# Patient Record
Sex: Female | Born: 1940
Health system: Southern US, Community
[De-identification: ages and names within clinical notes are randomized; demographics above are authoritative.]

## PROBLEM LIST (undated history)

## (undated) DIAGNOSIS — B029 Zoster without complications: Secondary | ICD-10-CM

## (undated) DIAGNOSIS — E785 Hyperlipidemia, unspecified: Secondary | ICD-10-CM

## (undated) DIAGNOSIS — D126 Benign neoplasm of colon, unspecified: Secondary | ICD-10-CM

## (undated) DIAGNOSIS — F329 Major depressive disorder, single episode, unspecified: Secondary | ICD-10-CM

## (undated) DIAGNOSIS — J45909 Unspecified asthma, uncomplicated: Secondary | ICD-10-CM

## (undated) DIAGNOSIS — R1310 Dysphagia, unspecified: Secondary | ICD-10-CM

## (undated) DIAGNOSIS — N3281 Overactive bladder: Secondary | ICD-10-CM

## (undated) DIAGNOSIS — K649 Unspecified hemorrhoids: Secondary | ICD-10-CM

## (undated) DIAGNOSIS — M1990 Unspecified osteoarthritis, unspecified site: Secondary | ICD-10-CM

## (undated) DIAGNOSIS — M5134 Other intervertebral disc degeneration, thoracic region: Secondary | ICD-10-CM

## (undated) DIAGNOSIS — H9190 Unspecified hearing loss, unspecified ear: Secondary | ICD-10-CM

## (undated) DIAGNOSIS — K222 Esophageal obstruction: Secondary | ICD-10-CM

## (undated) DIAGNOSIS — J42 Unspecified chronic bronchitis: Secondary | ICD-10-CM

## (undated) DIAGNOSIS — K449 Diaphragmatic hernia without obstruction or gangrene: Secondary | ICD-10-CM

## (undated) DIAGNOSIS — Z634 Disappearance and death of family member: Secondary | ICD-10-CM

## (undated) DIAGNOSIS — R569 Unspecified convulsions: Secondary | ICD-10-CM

## (undated) DIAGNOSIS — K219 Gastro-esophageal reflux disease without esophagitis: Secondary | ICD-10-CM

## (undated) DIAGNOSIS — R32 Unspecified urinary incontinence: Secondary | ICD-10-CM

## (undated) DIAGNOSIS — I2510 Atherosclerotic heart disease of native coronary artery without angina pectoris: Secondary | ICD-10-CM

## (undated) DIAGNOSIS — I10 Essential (primary) hypertension: Secondary | ICD-10-CM

## (undated) DIAGNOSIS — F419 Anxiety disorder, unspecified: Secondary | ICD-10-CM

## (undated) DIAGNOSIS — K2940 Chronic atrophic gastritis without bleeding: Secondary | ICD-10-CM

## (undated) DIAGNOSIS — E7800 Pure hypercholesterolemia, unspecified: Secondary | ICD-10-CM

## (undated) DIAGNOSIS — I341 Nonrheumatic mitral (valve) prolapse: Secondary | ICD-10-CM

## (undated) DIAGNOSIS — F32A Depression, unspecified: Secondary | ICD-10-CM

## (undated) DIAGNOSIS — R7301 Impaired fasting glucose: Secondary | ICD-10-CM

## (undated) DIAGNOSIS — I219 Acute myocardial infarction, unspecified: Secondary | ICD-10-CM

## (undated) DIAGNOSIS — E039 Hypothyroidism, unspecified: Secondary | ICD-10-CM

## (undated) DIAGNOSIS — K589 Irritable bowel syndrome without diarrhea: Secondary | ICD-10-CM

## (undated) HISTORY — PX: NO SURGICAL HISTORY: 101002

## (undated) HISTORY — PX: COLONOSCOPY: GILAB00002

## (undated) HISTORY — DX: Zoster without complications: B02.9

## (undated) HISTORY — PX: TUBAL LIGATION: SHX77

## (undated) HISTORY — DX: Acute myocardial infarction, unspecified: I21.9

## (undated) HISTORY — DX: Essential (primary) hypertension: I10

## (undated) HISTORY — DX: Anxiety disorder, unspecified: F41.9

## (undated) HISTORY — PX: LAPAROSCOPIC CHOLECYSTECTOMY: SUR755

## (undated) HISTORY — DX: Unspecified hemorrhoids: K64.9

## (undated) HISTORY — DX: Diaphragmatic hernia without obstruction or gangrene: K44.9

## (undated) HISTORY — DX: Disappearance and death of family member: Z63.4

## (undated) HISTORY — DX: Dysphagia, unspecified: R13.10

## (undated) HISTORY — DX: Unspecified asthma, uncomplicated: J45.909

## (undated) HISTORY — PX: TOE SURGERY: SHX1073

## (undated) HISTORY — PX: BACK SURGERY: SHX140

## (undated) HISTORY — PX: FINGER SURGERY: SHX640

## (undated) HISTORY — PX: LUMBAR LAMINECTOMY: SHX95

## (undated) HISTORY — DX: Benign neoplasm of colon, unspecified: D12.6

## (undated) HISTORY — PX: DILATION AND CURETTAGE OF UTERUS: SHX78

## (undated) HISTORY — PX: INCONTINENCE SURGERY: SHX676

## (undated) HISTORY — PX: CARPAL TUNNEL RELEASE: SHX101

## (undated) HISTORY — DX: Other intervertebral disc degeneration, thoracic region: M51.34

## (undated) HISTORY — DX: Unspecified convulsions: R56.9

## (undated) HISTORY — DX: Unspecified hearing loss, unspecified ear: H91.90

## (undated) HISTORY — DX: Esophageal obstruction: K22.2

## (undated) HISTORY — DX: Impaired fasting glucose: R73.01

## (undated) HISTORY — DX: Irritable bowel syndrome, unspecified: K58.9

## (undated) HISTORY — DX: Overactive bladder: N32.81

## (undated) HISTORY — DX: Chronic atrophic gastritis without bleeding: K29.40

## (undated) HISTORY — PX: CARDIAC CATHETERIZATION: SHX172

---

## 1968-10-19 HISTORY — PX: VAGINAL HYSTERECTOMY: SUR661

## 1998-09-04 ENCOUNTER — Ambulatory Visit (HOSPITAL_COMMUNITY): Admission: RE | Admit: 1998-09-04 | Discharge: 1998-09-04 | Payer: Self-pay | Admitting: *Deleted

## 1998-11-19 ENCOUNTER — Encounter: Payer: Self-pay | Admitting: Family Medicine

## 1998-11-19 ENCOUNTER — Ambulatory Visit (HOSPITAL_COMMUNITY): Admission: RE | Admit: 1998-11-19 | Discharge: 1998-11-19 | Payer: Self-pay | Admitting: Family Medicine

## 1999-12-16 ENCOUNTER — Encounter: Payer: Self-pay | Admitting: Family Medicine

## 1999-12-16 ENCOUNTER — Encounter: Admission: RE | Admit: 1999-12-16 | Discharge: 1999-12-16 | Payer: Self-pay | Admitting: Family Medicine

## 1999-12-19 ENCOUNTER — Encounter: Admission: RE | Admit: 1999-12-19 | Discharge: 1999-12-19 | Payer: Self-pay | Admitting: Family Medicine

## 1999-12-19 ENCOUNTER — Encounter: Payer: Self-pay | Admitting: Family Medicine

## 1999-12-22 ENCOUNTER — Ambulatory Visit (HOSPITAL_COMMUNITY): Admission: RE | Admit: 1999-12-22 | Discharge: 1999-12-22 | Payer: Self-pay | Admitting: Family Medicine

## 1999-12-22 ENCOUNTER — Encounter: Payer: Self-pay | Admitting: Family Medicine

## 1999-12-31 ENCOUNTER — Encounter: Admission: RE | Admit: 1999-12-31 | Discharge: 1999-12-31 | Payer: Self-pay | Admitting: *Deleted

## 1999-12-31 ENCOUNTER — Encounter: Payer: Self-pay | Admitting: *Deleted

## 2000-02-17 ENCOUNTER — Ambulatory Visit (HOSPITAL_COMMUNITY): Admission: RE | Admit: 2000-02-17 | Discharge: 2000-02-17 | Payer: Self-pay | Admitting: Family Medicine

## 2000-03-02 ENCOUNTER — Ambulatory Visit (HOSPITAL_COMMUNITY): Admission: RE | Admit: 2000-03-02 | Discharge: 2000-03-02 | Payer: Self-pay | Admitting: Family Medicine

## 2000-03-16 ENCOUNTER — Ambulatory Visit (HOSPITAL_COMMUNITY): Admission: RE | Admit: 2000-03-16 | Discharge: 2000-03-16 | Payer: Self-pay | Admitting: *Deleted

## 2000-12-23 ENCOUNTER — Encounter: Admission: RE | Admit: 2000-12-23 | Discharge: 2000-12-23 | Payer: Self-pay | Admitting: Family Medicine

## 2000-12-23 ENCOUNTER — Encounter: Payer: Self-pay | Admitting: Family Medicine

## 2001-02-05 ENCOUNTER — Emergency Department (HOSPITAL_COMMUNITY): Admission: EM | Admit: 2001-02-05 | Discharge: 2001-02-05 | Payer: Self-pay | Admitting: Emergency Medicine

## 2001-02-15 ENCOUNTER — Encounter: Admission: RE | Admit: 2001-02-15 | Discharge: 2001-02-15 | Payer: Self-pay | Admitting: Family Medicine

## 2001-02-15 ENCOUNTER — Encounter: Payer: Self-pay | Admitting: Family Medicine

## 2001-04-29 ENCOUNTER — Encounter: Payer: Self-pay | Admitting: Family Medicine

## 2001-04-29 ENCOUNTER — Encounter: Admission: RE | Admit: 2001-04-29 | Discharge: 2001-04-29 | Payer: Self-pay | Admitting: Family Medicine

## 2001-05-17 ENCOUNTER — Other Ambulatory Visit: Admission: RE | Admit: 2001-05-17 | Discharge: 2001-05-17 | Payer: Self-pay | Admitting: Family Medicine

## 2001-12-27 ENCOUNTER — Encounter: Payer: Self-pay | Admitting: Family Medicine

## 2001-12-27 ENCOUNTER — Encounter: Admission: RE | Admit: 2001-12-27 | Discharge: 2001-12-27 | Payer: Self-pay | Admitting: Family Medicine

## 2002-04-04 ENCOUNTER — Ambulatory Visit (HOSPITAL_COMMUNITY): Admission: RE | Admit: 2002-04-04 | Discharge: 2002-04-04 | Payer: Self-pay | Admitting: Gastroenterology

## 2002-04-04 ENCOUNTER — Encounter: Payer: Self-pay | Admitting: Gastroenterology

## 2002-06-09 ENCOUNTER — Encounter: Payer: Self-pay | Admitting: Family Medicine

## 2002-06-09 ENCOUNTER — Encounter: Admission: RE | Admit: 2002-06-09 | Discharge: 2002-06-09 | Payer: Self-pay | Admitting: Family Medicine

## 2002-06-26 ENCOUNTER — Encounter: Admission: RE | Admit: 2002-06-26 | Discharge: 2002-06-26 | Payer: Self-pay | Admitting: Family Medicine

## 2002-06-26 ENCOUNTER — Encounter: Payer: Self-pay | Admitting: Family Medicine

## 2002-07-10 ENCOUNTER — Encounter: Admission: RE | Admit: 2002-07-10 | Discharge: 2002-07-10 | Payer: Self-pay | Admitting: Family Medicine

## 2002-07-10 ENCOUNTER — Encounter: Payer: Self-pay | Admitting: Family Medicine

## 2002-10-19 DIAGNOSIS — B029 Zoster without complications: Secondary | ICD-10-CM

## 2002-10-19 HISTORY — DX: Zoster without complications: B02.9

## 2002-12-19 ENCOUNTER — Encounter: Payer: Self-pay | Admitting: Family Medicine

## 2002-12-19 ENCOUNTER — Encounter: Admission: RE | Admit: 2002-12-19 | Discharge: 2002-12-19 | Payer: Self-pay | Admitting: Family Medicine

## 2002-12-29 ENCOUNTER — Encounter: Payer: Self-pay | Admitting: Family Medicine

## 2002-12-29 ENCOUNTER — Encounter: Admission: RE | Admit: 2002-12-29 | Discharge: 2002-12-29 | Payer: Self-pay | Admitting: Family Medicine

## 2003-05-15 ENCOUNTER — Encounter: Payer: Self-pay | Admitting: Gastroenterology

## 2003-05-15 ENCOUNTER — Ambulatory Visit (HOSPITAL_COMMUNITY): Admission: RE | Admit: 2003-05-15 | Discharge: 2003-05-15 | Payer: Self-pay | Admitting: Gastroenterology

## 2004-01-02 ENCOUNTER — Encounter: Admission: RE | Admit: 2004-01-02 | Discharge: 2004-01-02 | Payer: Self-pay | Admitting: Family Medicine

## 2004-01-22 ENCOUNTER — Ambulatory Visit (HOSPITAL_COMMUNITY): Admission: RE | Admit: 2004-01-22 | Discharge: 2004-01-22 | Payer: Self-pay | Admitting: Family Medicine

## 2004-02-20 ENCOUNTER — Encounter: Admission: RE | Admit: 2004-02-20 | Discharge: 2004-05-20 | Payer: Self-pay | Admitting: Family Medicine

## 2004-12-15 ENCOUNTER — Ambulatory Visit (HOSPITAL_COMMUNITY): Admission: RE | Admit: 2004-12-15 | Discharge: 2004-12-15 | Payer: Self-pay | Admitting: Orthopedic Surgery

## 2005-01-08 ENCOUNTER — Encounter: Admission: RE | Admit: 2005-01-08 | Discharge: 2005-01-08 | Payer: Self-pay | Admitting: Internal Medicine

## 2005-01-29 ENCOUNTER — Ambulatory Visit (HOSPITAL_COMMUNITY): Admission: RE | Admit: 2005-01-29 | Discharge: 2005-01-29 | Payer: Self-pay | Admitting: Orthopedic Surgery

## 2005-01-29 ENCOUNTER — Ambulatory Visit (HOSPITAL_BASED_OUTPATIENT_CLINIC_OR_DEPARTMENT_OTHER): Admission: RE | Admit: 2005-01-29 | Discharge: 2005-01-29 | Payer: Self-pay | Admitting: Orthopedic Surgery

## 2005-09-21 HISTORY — PX: TOE SURGERY: SHX1073

## 2005-09-24 ENCOUNTER — Encounter (INDEPENDENT_AMBULATORY_CARE_PROVIDER_SITE_OTHER): Payer: Self-pay | Admitting: Specialist

## 2005-09-24 ENCOUNTER — Ambulatory Visit (HOSPITAL_COMMUNITY): Admission: RE | Admit: 2005-09-24 | Discharge: 2005-09-24 | Payer: Self-pay | Admitting: Podiatry

## 2005-09-24 ENCOUNTER — Ambulatory Visit (HOSPITAL_BASED_OUTPATIENT_CLINIC_OR_DEPARTMENT_OTHER): Admission: RE | Admit: 2005-09-24 | Discharge: 2005-09-24 | Payer: Self-pay | Admitting: Podiatry

## 2006-01-13 ENCOUNTER — Encounter: Admission: RE | Admit: 2006-01-13 | Discharge: 2006-01-13 | Payer: Self-pay | Admitting: Internal Medicine

## 2006-03-23 ENCOUNTER — Ambulatory Visit: Payer: Self-pay

## 2006-04-22 ENCOUNTER — Ambulatory Visit

## 2006-04-23 ENCOUNTER — Encounter: Payer: Self-pay | Admitting: Internal Medicine

## 2006-05-25 ENCOUNTER — Encounter: Payer: Self-pay | Admitting: Internal Medicine

## 2006-06-07 ENCOUNTER — Encounter: Payer: Self-pay | Admitting: Internal Medicine

## 2006-07-02 ENCOUNTER — Ambulatory Visit

## 2006-07-19 ENCOUNTER — Encounter: Payer: Self-pay | Admitting: Internal Medicine

## 2006-07-19 ENCOUNTER — Ambulatory Visit: Admitting: Internal Medicine

## 2006-07-19 ENCOUNTER — Ambulatory Visit

## 2006-07-19 ENCOUNTER — Encounter

## 2006-07-19 DIAGNOSIS — R5381 Other malaise: Secondary | ICD-10-CM | POA: Insufficient documentation

## 2006-07-19 DIAGNOSIS — R222 Localized swelling, mass and lump, trunk: Secondary | ICD-10-CM | POA: Insufficient documentation

## 2006-07-19 DIAGNOSIS — E039 Hypothyroidism, unspecified: Secondary | ICD-10-CM | POA: Insufficient documentation

## 2006-07-19 DIAGNOSIS — Z0000 Encounter for general adult medical examination without abnormal findings: Secondary | ICD-10-CM | POA: Insufficient documentation

## 2006-07-19 DIAGNOSIS — R634 Abnormal weight loss: Secondary | ICD-10-CM | POA: Insufficient documentation

## 2006-07-19 NOTE — Nursing Note (Signed)
>>   Jacelyn Grip, LVN     Mon Jul 19, 2006  8:20 AM  Vital signs taken, allergies verified, screened for pain, med hx taken, Jacelyn Grip, [place]

## 2006-07-19 NOTE — Patient Instructions (Signed)
1500-1800 mg of calcium a day.  MVT and diet usually have 300-600 mg of calcium.  You need to take 2 pills of calcium a day, each can be 500-600 mg. Take the calcium pills which have Vit D.

## 2006-07-19 NOTE — Progress Notes (Signed)
SUBJECTIVE:   54yr female for establishing care and multiple medical problems.    She has been working in a stressful job situation. She has been having a lot of stress in her job. She felt stressed and depressed. She quit her job and now she feels better. She has better sleep now and feels that her fatigue should be better.    She has lost more than 30 pounds in the last 3-4 months. She relates it to being stresses at work and not having appetite. She has also been watching her diet and avoiding high calorie foods.     Current outpatient prescriptions:  SYNTHROID 125 MCG TAB, take 1 tablet (125 mcg) by oral route once daily, Disp: , Rfl:     Allergies: Review of patient's allergies indicates no known allergies.   No LMP date recorded. Reason: Postmenopausal.    ROS: Feeling well. No dyspnea or chest pain on exertion. No abdominal pain, change in bowel habits, black or bloody stools. No urinary tract symptoms. GYN ROS: no breast pain or new or enlarging lumps on self exam, no vaginal bleeding, no discharge or pelvic pain, no hot flashes.    review of all other systems were negative    Social History   Marital Status: MARRIED Spouse Name:    Years of Education: Number of children: 2     Occupational History  Occupation Research scientist (life sciences)   used to work with *     Social History Main Topics   Tobacco Use: Never    Alcohol Use: No    Drug Use: No    Sexual Activity: Not on file     Other Topics Concern   None on file    Social History Narrative   None on file      Past Surgical History:   NO SURGICAL HISTORY   Review of patient's family history indicates:   Cancer Sister    Comment: oral   Heart Sister    Heart Mother       There is no immunization history on file for this patient.    OBJECTIVE:   The patient appears well, in NAD.   BP 110/70  Pulse 60  Wt 78.93 kg (174 lb)  LMP Postmenopausal  ENT normal. Neck supple. No. She has fullness and bulging in her left supraclavicular area. No discrete lymph adenopathy was  felt in that area. She has small 1x1 cm right sided axillary lymph adenopathy. No thyromegaly. PERLA. Clear, good air entry, no wheezes, rhonci or rales. S1 and S2 normal, no murmurs, regular rate and rhythm. No edema. Abdomen soft without tenderness, guarding, mass or organomegaly.    She has tenderness on the right lateral process of her lower l spine and upper pelvic rim area.    BREAST EXAM: she refuses  PELVIC EXAM: she refuses   EXTREMETIES: Exam of four extremities shows no cyanosis or clubbing or edema. Pulses 2+ throughout.    ASSESSMENT:     244.9 UNSPECIFIED ACQUIRED HYPOTHYROIDISM  Comment: loosing weight and fatigue  Plan: repeat TSH WITH FREE T4 REFLEX       780.79 OTHER MALAISE AND FATIGUE  Comment: concerning as she had weight loss and possible mass in left supraclavicular area. She relates it to her stressful life  Plan: CBC AUTO + REFLEX MANUAL DIFF, BASIC METABOLIC    PANEL, HEPATIC FUNCTION PANEL, LIPID PANEL WITH   DLDL REFLEX, TSH WITH FREE T4 REFLEX,    URINALYSIS AND CULTURE IF  IND       V70.0 ROUTINE MEDICAL EXAM  Comment: no mammogram in few years. Refuses  no pap smear in a few years. Refuses.  She refuses DEXA scan.  refuses colonoscopy.  Plan: CBC AUTO + REFLEX MANUAL DIFF, BASIC METABOLIC    PANEL, HEPATIC FUNCTION PANEL, LIPID PANEL WITH   DLDL REFLEX, TSH WITH FREE T4 REFLEX,    URINALYSIS AND CULTURE IF IND       783.21 LOSS OF WEIGHT  Comment: rule out cancer   Plan: SED RATE WESTERGREN, CHEST 2 VIEWS, [place] SOFT    TISSUE HEAD & NECK       786.6 left supraclavicular fullness  Comment:   Plan: SED RATE WESTERGREN, CHEST 2 VIEWS, [place] SOFT    TISSUE HEAD & NECK       724.2 LUMBAGO  Comment:   Plan: L-SPINE 2 OR 3 VIEWS, PELVIS 1 OR 2 VIEWS       I did review patient's past medical and family/social history. History reviewed and no changes made  Barriers to Learning assessed: none. Patient verbalizes understanding of teaching and instructions.  Altamese [place], M.D.

## 2006-07-20 ENCOUNTER — Encounter

## 2006-07-26 ENCOUNTER — Ambulatory Visit: Admitting: Internal Medicine

## 2006-07-26 DIAGNOSIS — E7800 Pure hypercholesterolemia, unspecified: Secondary | ICD-10-CM | POA: Insufficient documentation

## 2006-07-26 MED ORDER — SIMVASTATIN 10 MG TABLET
ORAL_TABLET | ORAL | Status: DC
Start: 2006-07-26 — End: 2006-09-06

## 2006-07-26 MED ORDER — SYNTHROID 112 MCG TABLET
ORAL_TABLET | ORAL | Status: DC
Start: 2006-07-26 — End: 2006-09-06

## 2006-07-26 NOTE — Progress Notes (Signed)
Patient presents with:    Test Results    She had a ultrasonography done and also chest x ray. Chest x ray was normal lung. She had a mass in the left supraclavicular which is not cystic. Possible lippoma but needs further work up.      Dyslipidemia: she engages in minimal exercise and describes her diet as typical American, low fat/low cholesterol mixed.   Last lipid panel: CHOL 292 07/20/2006  LDLC 200 07/20/2006  HDL 66 07/20/2006  TRIG 131 07/20/2006  Lfts normal    Her x ray showed bilateral sacroileitis and also showed bilateral osteoarthritis in both hips.    The patient denies cough, chest pain, dyspnea, wheezing or hemoptysis.    Patient denies any exertional chest pain, dyspnea, palpitations, syncope, orthopnea, edema or paroxysmal nocturnal dyspnea.    review of all other systems were negative  OBJECTIVE:  BP 116/76  Pulse 68  Ht 1.676 m (5\' 6" )  Wt 80.29 kg (177 lb)  LMP Postmenopausal  Generally looks comfortable in no acute distress.    ASSESSMENT:  786.6 left supraclavicular fullness  Comment: needs biopsy  Plan: SURGERY - GENERAL REFERRAL       244.9 UNSPECIFIED ACQUIRED HYPOTHYROIDISM  Comment: too supressed TSH, needs decrease in her medication dose   Plan: decrease to SYNTHROID 112 MCG TAB, TSH WITH FREE T4 REFLEX   In 4-6 months    272.0 PURE HYPERCHOLESTEROLEMIA  Comment: Cholesterol Panel  CHOL 292 07/20/2006  LDLC 200 07/20/2006  HDL 66 07/20/2006  TRIG 131 07/20/2006    Total cholesterol- less than 200  LDL-less than 100  HDL- greater than 45 for men, 55 for women  Triglycerides- less than 160    Your cholesterol is abnormal, regular aerobic exercise and a diet low in fat and high in fiber are recommended, need medication        Plan: start SIMVASTATIN 10 MG TAB   The patient is asked to make an attempt to improve diet and exercise patterns and loose weight to aid in medical management of this problem.      V05.4 VACCINE FOR VARICELLA  Comment:   Plan: ZOSTER VACCINE (LIVE)       I did  review patient's past medical and family/social history. History reviewed and no changes made  Barriers to Learning assessed: none. Patient verbalizes understanding of teaching and instructions.  Altamese [place], M.D.

## 2006-07-26 NOTE — Nursing Note (Signed)
>>   Donah Driver, LVN     Mon Jul 26, 2006 11:03 AM  pt screened for pain,vitals taken,allegies reviewed,medications reviewed Clois Comber SR LVN

## 2006-08-05 ENCOUNTER — Ambulatory Visit

## 2006-08-05 NOTE — Nursing Note (Signed)
>>   Tammy [name], LVN     Thu Aug 05, 2006 10:48 AM  Immunization VIS documentation(s) were given to pt to review. All questions were answered and the patient consented to the Immunization(s) being given. Patient allergies were reviewed and no contraindications were found. The immunization(s) were given as ordered. The patient was observed for any immediate reactions to the vaccine. None were observed.  [name], LVN

## 2006-08-16 ENCOUNTER — Encounter: Payer: Self-pay | Admitting: Internal Medicine

## 2006-08-20 ENCOUNTER — Ambulatory Visit

## 2006-08-20 NOTE — Nursing Note (Signed)
>>   Emerson Monte, LVN     Mon Aug 23, 2006 10:11 AM  The patient completed the flu questionnaire and signed the consent. The Inactivated Influenza Vaccine VIS document was given to the patient to review and any questions they had were answered. The consent was then reviewed for any Yes answers.  All questions were answered as No. The Influenza Vaccine was then administered per protocol. The patient was observed for immediate reactions to the vaccine per protocol. None were observed.  B Barber L.V.N

## 2006-09-01 ENCOUNTER — Encounter

## 2006-09-06 ENCOUNTER — Ambulatory Visit: Admitting: Internal Medicine

## 2006-09-06 MED ORDER — SIMVASTATIN 10 MG TABLET
ORAL_TABLET | ORAL | Status: DC
Start: 2006-09-06 — End: 2008-03-08

## 2006-09-06 MED ORDER — SYNTHROID 112 MCG TABLET
ORAL_TABLET | ORAL | Status: DC
Start: 2006-09-06 — End: 2007-06-30

## 2006-09-06 NOTE — Nursing Note (Signed)
>>   Emerson Monte, LVN     Mon Sep 06, 2006  8:39 AM  vital signs taken,pain assessed  ,allergies, and pharmacy information reviewed.    B.Barber L.V.N.

## 2006-09-06 NOTE — Progress Notes (Signed)
Patient presents with:     Test Results    Medication Follow Up    She had a supraclavicular fullness. She had CT for it and was negative.    She also has changed her thyroid medication dose and feels better on it.    She has no change in her weight. Her energy level is getting better.    She has started the cholestrol medication without side effects.    The patient denies cough, chest pain, dyspnea, wheezing or hemoptysis.  CARDIOVASCULAR: denies any exertional chest pain, dyspnea, palpitations, syncope, orthopnea, edema or paroxysmal nocturnal dyspnea.    The patient denies abdominal or flank pain, anorexia, nausea or vomiting, dysphagia, change in bowel habits or black or bloody stools.  review of all other systems were negative\  OBJECTIVE:    BP 100/60  Pulse 80  Ht 1.676 m (5\' 6" )  Wt 80.74 kg (178 lb)  LMP Postmenopausal  Generally looks comfortable in no acute distress.    ASSESSMENT:    244.9 UNSPECIFIED ACQUIRED HYPOTHYROIDISM  Comment: too supressed TSH, need decrease in dose  Plan: TSH WITH FREE T4 REFLEX, decrease SYNTHROID 112 MCG TAB            272.0 PURE HYPERCHOLESTEROLEMIA  Comment: Cholesterol Panel  Lab Results   Lab Name Value Date/Time    CHOL 292* 07/20/06 10:36 AM    LDLC 200* 07/20/06 10:36 AM    HDL 66 07/20/06 10:36 AM    TRIG 131 07/20/06 10:36 AM       Total cholesterol- less than 200  LDL- less than 130  HDL- greater than 45 for men, 55 for women  Triglycerides- less than 160    Your cholesterol is abnormal, regular aerobic exercise and a diet low in fat and high in fiber are recommended, you need medication     Plan: HEPATIC FUNCTION PANEL, LIPID PANEL WITH DLDL         REFLEX, start SIMVASTATIN 10 MG TAB            786.6 left supraclavicular fullness  Comment:   Plan: CT did not show significant finding. Follow up with Dr. Duard Brady    I did review patient's past medical and family/social history. History reviewed and no changes made  Barriers to Learning assessed: none. Patient  verbalizes understanding of teaching and instructions.  Altamese [place], M.D.

## 2006-10-19 HISTORY — PX: CARPAL TUNNEL RELEASE: SHX101

## 2006-10-20 ENCOUNTER — Telehealth: Payer: Self-pay | Admitting: Internal Medicine

## 2006-10-20 ENCOUNTER — Ambulatory Visit

## 2006-10-20 MED ORDER — BACTRIM DS 800 MG-160 MG TABLET
ORAL_TABLET | ORAL | Status: AC
Start: 2006-10-20 — End: 2006-10-30

## 2006-10-20 NOTE — Telephone Encounter (Signed)
Antibiotics faxed but she needs to go by the lab first for a Urinalysis and Culture - ordered.

## 2006-10-20 NOTE — Telephone Encounter (Signed)
Please advise. Dickie Labarre, Sr.LVN

## 2006-10-20 NOTE — Telephone Encounter (Signed)
Patient called and states that she has a UTI and would like a prescription called in to her pharmacy.  Patient has had burning and frequency and heavy feeling in her lower pelvis for three days. Please call patient and advise.    Debbie [employer]

## 2006-10-20 NOTE — Telephone Encounter (Addendum)
Patient advised and will go to the lab first for lab work and then will pick up Rx. Gaylyn Cheers, Sr.LVN

## 2006-11-25 ENCOUNTER — Ambulatory Visit (HOSPITAL_BASED_OUTPATIENT_CLINIC_OR_DEPARTMENT_OTHER): Admission: RE | Admit: 2006-11-25 | Discharge: 2006-11-26 | Payer: Self-pay | Admitting: Orthopedic Surgery

## 2007-03-30 ENCOUNTER — Encounter: Admission: RE | Admit: 2007-03-30 | Discharge: 2007-03-30 | Payer: Self-pay | Admitting: Internal Medicine

## 2007-06-30 ENCOUNTER — Other Ambulatory Visit: Payer: Self-pay | Admitting: Internal Medicine

## 2007-06-30 MED ORDER — SYNTHROID 112 MCG TABLET
ORAL_TABLET | ORAL | Status: DC
Start: 2007-06-30 — End: 2007-07-01

## 2007-07-01 ENCOUNTER — Other Ambulatory Visit: Payer: Self-pay | Admitting: Internal Medicine

## 2007-07-01 MED ORDER — SYNTHROID 112 MCG TABLET
ORAL_TABLET | ORAL | Status: DC
Start: 2007-07-01 — End: 2008-03-08

## 2007-07-01 NOTE — Telephone Encounter (Signed)
MAIL ORDER REFILL. Please call the patient once ready. Prior order was sent ot pharmacy in error. Tammy [name]  [facility]  [employer] [facility]

## 2007-08-16 ENCOUNTER — Ambulatory Visit

## 2007-08-18 ENCOUNTER — Ambulatory Visit: Admitting: Internal Medicine

## 2007-08-18 VITALS — BP 114/68 | HR 68 | Ht 66.0 in | Wt 174.0 lb

## 2007-08-18 NOTE — Progress Notes (Signed)
Chief Complaint   Patient presents with    Thyroid Problem       Fatigue,irritable  and loss of hair since  last 6 months   Her best friend died  Of lung cancer last month was taking care of her for last 8 weeks   Has been going to [employer]     Dyslipidemia: she engages in minimal exercise and describes his diet as low fat/low cholesterol and takes a lot of fruits.     Refused mammogram and pap     Current outpatient prescriptions   Medication Sig Dispense Refill    SYNTHROID 112 MCG TAB take 1 tablet (112 mcg) by oral route once daily  90  3    SIMVASTATIN 10 MG TAB take 1 tablet (10 mg) by oral route once daily in the evening  100  3       History   Social History    Marital Status: MARRIED     Spouse Name: N/A     Number of Children: 2    Years of Education: N/A   Occupational History    used to work with Physicist, medical    Social History Main Topics    Tobacco Use: Never    Alcohol Use: No    Drug Use: No    Sexually Active: Not on file   Other Topics Concern    Not on file   Social History Narrative    No narrative on file       Past Medical History   Diagnosis Date    HERPES ZOSTER WITHOUT COMPLICATION 2004           VS  BP 114/68  Pulse 68  Ht 1.676 m (5\' 6" )  Wt 78.926 kg (174 lb)  LMP Postmenopausal    Ros  all other systems are negative except as otherwise stated in HPI    Pe    General Appearance: healthy, alert, no distress, pleasant affect, cooperative.  Neck:  Neck supple. No adenopathy, thyroid symmetric, normal size.  Abdomen: BS normal.  Abdomen soft, non-tender.  No masses or organomegaly.  Extremities:  no cyanosis, clubbing, or edema.    ASSESMENT AND PLAN   244.9 UNSPECIFIED ACQUIRED HYPOTHYROIDISM  (primary encounter diagnosis)  Comment:   Plan: HEPATIC FUNCTION PANEL, LIPID PANEL, BASIC         METABOLIC PANEL, TSH (SENSITIVE)      272.0 Pure Hypercholesterolemia  Comment:   Plan: HEPATIC FUNCTION PANEL, LIPID PANEL, BASIC         METABOLIC PANEL, TSH (SENSITIVE)            V04.81J  Flu (Influenza Vaccination)  Comment:  Plan: INFLUENZA VACCINE, ADULT, TRACE THIMEROSAL            I did review patient's past medical and family/social history, no changes noted.  Barriers to Learning assessed: none. Patient verbalizes understanding of teaching and instructions.  Zettie Cooley MD

## 2007-08-18 NOTE — Progress Notes (Addendum)
Addended by: Ian Bushman on: 08/18/2007 2:36:01 PM     Modules accepted: Orders, Level of Service, SmartSet

## 2007-08-18 NOTE — Nursing Note (Signed)
>>   Ian Bushman, MA     Thu Aug 18, 2007  2:36 PM  The PATIENT  Completed the flu questionnaire and signed the consent.  The Inactivated Influenza Vaccine VIS document was given to PATIENT to review and any questions they  Had were answered.  The consent was then reviewed for any Yes answers.  All questions were answered as No.  The Influenza Vaccine was then administered per protocol.  The patient was observed for immediate reactions to the vaccine per protocol. None were observed.

## 2007-08-19 ENCOUNTER — Telehealth: Payer: Self-pay | Admitting: Internal Medicine

## 2007-08-19 ENCOUNTER — Encounter

## 2007-08-19 MED ORDER — VYTORIN 10MG-40MG TABLETS
ORAL_TABLET | ORAL | Status: AC
Start: 2007-08-19 — End: 2008-08-18

## 2007-08-19 NOTE — Telephone Encounter (Signed)
Discussed results of lipids   Not taking her simvastatin last few weeks  Will start on vytorin will call back she gets any myalagias   Compliance stressed

## 2007-10-27 ENCOUNTER — Telehealth: Payer: Self-pay | Admitting: Internal Medicine

## 2007-10-27 ENCOUNTER — Ambulatory Visit

## 2007-10-27 NOTE — Telephone Encounter (Addendum)
Spoke with patient. ID x 3 Advised lab has been ordered. Gave patient phone number for [address] lab to schedule appointment.

## 2007-10-27 NOTE — Telephone Encounter (Signed)
done

## 2007-10-27 NOTE — Telephone Encounter (Signed)
Patient called and stated that she needs lab orders.  Lab orders expired 11/08.  Please place lab orders and call patient when orders have been placed.

## 2007-11-03 ENCOUNTER — Encounter

## 2007-12-19 ENCOUNTER — Telehealth: Payer: Self-pay | Admitting: Internal Medicine

## 2007-12-19 NOTE — Telephone Encounter (Signed)
Patient is calling for results of recent labs for cholestrol levels. Please call back and advise the results. Patient would like to know what they need to do regarding any medication change/continuation. Thanks

## 2007-12-19 NOTE — Telephone Encounter (Addendum)
Patient informed that her lipids were great  She already had a copy [employer] SR LVN

## 2007-12-19 NOTE — Telephone Encounter (Signed)
She has had excellent improvement of her cholestrol with medication. Continue medication and repeat blood work in 3 omnths. Congratulations!    Give her a copy of her recent and old blood work so she can compare.  Tammy [name], M.D.

## 2008-02-21 ENCOUNTER — Telehealth: Payer: Self-pay | Admitting: Internal Medicine

## 2008-02-21 ENCOUNTER — Ambulatory Visit

## 2008-02-21 NOTE — Telephone Encounter (Signed)
Patient called and states that she would like lab orders placed for her upcoming 03/08/08. Patient would like her cholestrol and thyroid checked along with any other labs.    Debbie Tenny Craw  [facility]  Professional

## 2008-02-23 NOTE — Telephone Encounter (Signed)
Done

## 2008-02-23 NOTE — Telephone Encounter (Signed)
Patient still needs a TSH ordered for her labs on 03/08/08.    Tammy [name]  [facility] Internal Medicine  Professional

## 2008-02-23 NOTE — Telephone Encounter (Addendum)
pt informed that labs were ordered  Tammy [name] LVN

## 2008-02-29 ENCOUNTER — Encounter

## 2008-03-08 ENCOUNTER — Ambulatory Visit: Admitting: Internal Medicine

## 2008-03-08 VITALS — BP 122/70 | HR 68 | Ht 66.0 in | Wt 176.0 lb

## 2008-03-08 MED ORDER — LEVOTHYROXINE 112 MCG TABLET
1.0000 | ORAL_TABLET | Freq: Every day | ORAL | Status: DC
Start: 2008-03-08 — End: 2008-06-28

## 2008-03-08 NOTE — Progress Notes (Signed)
Chief Complaint   Patient presents with    Results       Dyslipidemia: she engages in minimal exercise and describes his diet as low fat/low cholesterol  Last lipid panel:  Lab Results   Lab Name Value Date/Time    CHOL 181 02/29/08  8:08 AM    LDLC 97 02/29/08  8:08 AM    HDL 56 02/29/08  8:08 AM    TRIG 138 02/29/08  8:08 AM       Her friend passed with lung cancer in oct 08 she is feeling  Depressed with feelings of hopelessness/worthlessess, insomnia, difficulty concentrating, no psychotic symptoms and no suicidal or homicidal thoughts, and she reports that  Her symptons are gradually are improving as she has been exercising and going to [employer] . Psychosocial factors include: family/personal problems. She has taken zoloft in past does not want to take it       Current outpatient prescriptions   Medication Sig Dispense Refill    Levothyroxine (SYNTHROID) 112 mcg PO Tablet Take 1 Tab by mouth every day.   90  3    VYTORIN 10MG -40MG  TABLETS take 1 tablet by mouth once daily at bedtime for cholesterol  30  12       History   Social History    Marital Status: MARRIED     Spouse Name: N/A     Number of Children: 2    Years of Education: N/A   Occupational History    used to work with Physicist, medical    Social History Main Topics    Tobacco Use: Never    Alcohol Use: No    Drug Use: No    Sexually Active: Not on file   Other Topics Concern    Not on file   Social History Narrative    No narrative on file     Vs  BP 122/70  Pulse 68  Ht 1.676 m (5\' 6" )  Wt 79.833 kg (176 lb)  LMP Postmenopausal    Ros  complete review of symptons was done all other systems are negative except as otherwise stated in HPI    Pe    General Appearance: healthy, alert, no distress, pleasant affect, cooperative.  Neck:  Neck supple. No adenopathy, thyroid symmetric, normal size.  Extremities:  no cyanosis, clubbing, or edema    ASSESMENT AND PLAN   272.0 Pure Hypercholesterolemia  (primary encounter diagnosis)  Comment:    Plan: LIPID PANEL WITH DLDL REFLEX, HEPATIC FUNCTION         PANEL, CBC AUTO + REFLEX MANUAL DIFF, TSH         (SENSITIVE)      244.9 UNSPECIFIED ACQUIRED HYPOTHYROIDISM  Comment:  Plan: LIPID PANEL WITH DLDL REFLEX, HEPATIC FUNCTION         PANEL, CBC AUTO + REFLEX MANUAL DIFF, TSH         (SENSITIVE), LEVOTHYROXINE 112 MCG TAB            V06.5D Need for Vaccine for DT (Diphtheria-Tetanus)  Comment:   Plan: TD IMMUNIZATION,IM/JET>7YO            V03.109F Need for Pneumococcal Vaccine  Comment:  Plan: PNEUMOCOCCAL VACCINE,23-VALENT,ADULT            244.9 Unspecified Hypothyroidism  Comment:   Plan: LEVOTHYROXINE 112 MCG TAB            I did review patient's past medical and family/social history, no changes noted.  Barriers to  Learning assessed: none. Patient verbalizes understanding of teaching and instructions.  Zettie Cooley MD  .

## 2008-03-08 NOTE — Nursing Note (Signed)
>>   Donah Driver, LVN     Thu Mar 08, 2008 10:05 AM  patients I D established, screened for pain,vitals taken,allegies reviewed,medications reviewed. Patient denies any new meds from other providers.Clois Comber SR LVN

## 2008-04-02 ENCOUNTER — Encounter: Admission: RE | Admit: 2008-04-02 | Discharge: 2008-04-02 | Payer: Self-pay | Admitting: Internal Medicine

## 2008-06-28 ENCOUNTER — Other Ambulatory Visit: Payer: Self-pay | Admitting: Internal Medicine

## 2008-06-28 MED ORDER — LEVOTHYROXINE 112 MCG TABLET
1.0000 | ORAL_TABLET | Freq: Every day | ORAL | Status: DC
Start: 2008-06-28 — End: 2008-12-10

## 2008-06-28 NOTE — Telephone Encounter (Signed)
Patient is requesting a mail order refill. Please call patient when ready.

## 2008-10-02 ENCOUNTER — Ambulatory Visit

## 2008-10-02 ENCOUNTER — Other Ambulatory Visit: Payer: Self-pay | Admitting: Internal Medicine

## 2008-10-02 NOTE — Telephone Encounter (Signed)
Patient is requesting a new prescription for Vytorin. Please place order.  Cato Mulligan   [place] [facility]   [place] [facility]  [phone]

## 2008-10-03 ENCOUNTER — Ambulatory Visit: Admitting: Internal Medicine

## 2008-10-03 VITALS — BP 122/68 | HR 72 | Ht 66.0 in | Wt 179.0 lb

## 2008-10-03 MED ORDER — SIMVASTATIN 40 MG TABLET
40.0000 mg | ORAL_TABLET | Freq: Every day | ORAL | Status: DC
Start: 2008-10-03 — End: 2009-10-14

## 2008-10-03 NOTE — Progress Notes (Signed)
Chief Complaint   Patient presents with    Ears Ringing (Tinnitus) Assoc with ASA Use      she started having tinnitus about three weeks ago.it is worse if she is lying down. She has no hearing loss rule out dizziness. She is not taking aspirin. She is not taking aspirin for a long time.    Her blood pressure in home is good. She is under a lot of stress. She doesn't want any medication.    Her vytorin 10-40 is no longer covered by her insurance. It was changed to zocor 40 mg. She also needs to be tested for her liver function test and her lipid numbers.    The patient denies cough, chest pain, dyspnea, wheezing or hemoptysis.  CARDIOVASCULAR: denies any exertional chest pain, dyspnea, palpitations, syncope, orthopnea, edema or paroxysmal nocturnal dyspnea.  review of all other systems were negative  OBJECTIVE:  BP 122/68  Pulse 72  Ht 1.676 m (5\' 6" )  Wt 81.194 kg (179 lb)  LMP Postmenopausal  Generally looks comfortable in no acute distress.  S1 and S2 normal, no murmurs, clicks, gallops or rubs. Regular rate and rhythm. Chest is clear; no wheezes or rales. No edema or JVD.  Her both ears showed plenty of wax. TM was not visualized secondary to wax.    ASSESSMENT:  272.0 Pure Hypercholesterolemia  (primary encounter diagnosis)  Comment: stop vytorin, start lipitor, needs blood work   Plan: SIMVASTATIN 40 MG TAB, TSH WITH FREE T4 REFLEX,        LIPID PANEL WITH DLDL REFLEX, COMPREHENSIVE         METABOLIC PANEL        The patient is asked to make an attempt to improve diet and exercise patterns and loose weight to aid in medical management of this problem.      388.30E Tinnitus  Comment: wax may have a role otherwise needs suppresive therapy with listening to music, bio feed back, etc. Refuses referral to ENT, no bruit heard.   Plan: remove wax,as above     244.9 UNSPECIFIED ACQUIRED HYPOTHYROIDISM  Comment:   Plan: needs repeat blood work TSH     380.4E Wax in Ear  Comment: may have a role in her  tinnitus  Plan: advised to use over the counter wax removers like debrox. If not better, come for lavage    V58.69 Encounter for Long-Term (Current) Use of Other Medications  Comment:   Plan: VITAMIN D, 25 HYDROXY            I did review patient's past medical and family/social history. History reviewed and no changes made  Barriers to Learning assessed: none. Patient verbalizes understanding of teaching and instructions.  Altamese [place], M.D.

## 2008-10-04 ENCOUNTER — Encounter

## 2008-10-04 NOTE — Telephone Encounter (Signed)
Patient was seen about this the next day  Clois Comber SR LVN

## 2008-10-04 NOTE — Telephone Encounter (Signed)
i thought we decided that she will change to zocor and prescription was given toher as vytorin was not covered. Does she want vytorin or another prescription for zocor?  Altamese [place], M.D.

## 2008-10-17 ENCOUNTER — Encounter

## 2008-12-04 ENCOUNTER — Telehealth: Payer: Self-pay | Admitting: Internal Medicine

## 2008-12-04 NOTE — Telephone Encounter (Signed)
Patient is calling for results of labs done in December. Please call back and advise the results. Thanks

## 2008-12-05 NOTE — Telephone Encounter (Signed)
She was called and informed that her LDL was high, her thyroid is hypo and her vit D is low. She will take vit D and will recheck thyroid and cholestrol again. She will make an appointment to see me after that.

## 2008-12-06 ENCOUNTER — Ambulatory Visit

## 2008-12-10 ENCOUNTER — Encounter

## 2008-12-10 ENCOUNTER — Telehealth: Payer: Self-pay | Admitting: Internal Medicine

## 2008-12-10 MED ORDER — LEVOTHYROXINE 125 MCG TABLET
125.0000 ug | ORAL_TABLET | Freq: Every day | ORAL | Status: DC
Start: 2008-12-10 — End: 2009-03-07

## 2008-12-10 NOTE — Telephone Encounter (Signed)
Tenderness was informed of her lipid and cholestrol results. Her Thyroid medication was increased to 125 mcg. She has gained some weight and advised to loose it and increase exercise, fiber, low fat low cholestrol diet. Will repeat blood work in 3 months.

## 2009-03-04 ENCOUNTER — Ambulatory Visit

## 2009-03-05 ENCOUNTER — Encounter

## 2009-03-06 ENCOUNTER — Telehealth: Payer: Self-pay | Admitting: Internal Medicine

## 2009-03-06 NOTE — Telephone Encounter (Signed)
Please inform her cholestrol level is very good. All her blood work looks very good except the increase thyroid may be a little too much. It is better to alternate between her 125 mcg and her 112 mcg and take them in alternate days. (One day 112 and next 125 and then repeat). If needs prescription, please let me know to send refil to her pharmacy. Repeat blood work in 6-8 weeks non fating for thyroid.

## 2009-03-07 MED ORDER — LEVOTHYROXINE 112 MCG TABLET
ORAL_TABLET | ORAL | Status: DC
Start: 2009-03-07 — End: 2010-02-14

## 2009-03-07 MED ORDER — LEVOTHYROXINE 125 MCG TABLET
ORAL_TABLET | ORAL | Status: DC
Start: 2009-03-07 — End: 2010-02-04

## 2009-03-07 NOTE — Telephone Encounter (Addendum)
Left Message via answering machine to please call back  H MacKenzie Sr.LVN

## 2009-03-07 NOTE — Telephone Encounter (Signed)
Both were called to her pharmacy.sorry for inconvenience.

## 2009-03-07 NOTE — Telephone Encounter (Signed)
Patient called stating she went to pharmacy to pick up her two thyroid medications, but pharmacy informed her they were not called in.  Please call patient once this has been processed so she can pick up meds after work today.  (see MD note below)

## 2009-03-07 NOTE — Telephone Encounter (Addendum)
Patient called informed rx ready to pick up H Mackenzie Sr Lvn

## 2009-04-03 ENCOUNTER — Encounter: Admission: RE | Admit: 2009-04-03 | Discharge: 2009-04-03 | Payer: Self-pay | Admitting: Internal Medicine

## 2009-04-05 ENCOUNTER — Ambulatory Visit

## 2009-04-08 ENCOUNTER — Telehealth: Payer: Self-pay | Admitting: Internal Medicine

## 2009-04-08 ENCOUNTER — Encounter: Admitting: Internal Medicine

## 2009-04-08 NOTE — Telephone Encounter (Signed)
A user error has taken place: Encounter opened in error, closed for administrative reasons.

## 2009-04-09 ENCOUNTER — Telehealth: Payer: Self-pay | Admitting: Internal Medicine

## 2009-04-09 ENCOUNTER — Encounter

## 2009-04-09 ENCOUNTER — Ambulatory Visit: Admitting: Internal Medicine

## 2009-04-09 ENCOUNTER — Encounter: Admitting: Internal Medicine

## 2009-04-09 ENCOUNTER — Ambulatory Visit

## 2009-04-09 DIAGNOSIS — I4891 Unspecified atrial fibrillation: Secondary | ICD-10-CM | POA: Insufficient documentation

## 2009-04-09 DIAGNOSIS — I2699 Other pulmonary embolism without acute cor pulmonale: Secondary | ICD-10-CM | POA: Insufficient documentation

## 2009-04-09 MED ORDER — TRIAMCINOLONE ACETONIDE 0.5 % TOPICAL CREAM
TOPICAL_CREAM | Freq: Two times a day (BID) | TOPICAL | Status: DC
Start: 2009-04-09 — End: 2009-05-09

## 2009-04-09 MED ORDER — ZOLPIDEM 5 MG TABLET
5.0000 mg | ORAL_TABLET | Freq: Every day | ORAL | Status: DC | PRN
Start: 2009-04-09 — End: 2009-05-02

## 2009-04-09 NOTE — Progress Notes (Addendum)
PATIENT: Tammy [name], Tammy [name] [facility]ATION[facility]   MR #: [id number] SEX: F AGE: 68  DATE OF SERVICE: 04/09/2009 DOB: 05-13-1941     CLINIC NOTE    This is a 68 year old female who comes in with her daughter today. She was recently discharged from [facility] in [place], [place]. She was admitted there on the 18th of June for shortness of breath. According to her, she was having right sided chest pain for one to two weeks prior. She thought it was a muscle spasm . She went on a vacation to her daughter's house in [place] on the 16th and took 1 hour flight . She continued to have chest pain which got progressively worse with shortness of breath when her daughter took her to the ER where she was found to have a rapid A Fib and pulmonary embolism . She was started on anticoagulation. She was discharged on the 20th of June. She has been on Lovenox 80mg  twice a day and Coumadin 5mg  a day. She had an ultrasound of her lower extremities to rule out a DVT which was negative.    She had an echocardiogram which showed normal LV systolic function, no regional wall motion abnormality, normal RV size with normal RV systolic function, normal valves and no pericardial effusion.    Her chest angiogram shows a pulmonary embolism in the medial base of the right lower lobe segment of the pulmonary artery, right lower lobe atelectasis and a right pleural effusion.    Her blood counts from the ER had WBC of 15.8, RBC 4.77, hemoglobin 14.1, hematocrit 43.3, MCV of 90.9. platelets 398. Sodium 137, potassium 3.5, chloride 102, CO2 26, BUN 18, creatinine 0.72. Glucose 139, calcium 8.7.    She also complains of a rash above bilateral eyes which has been ongoing for the last few days. She has been using over the counter antibiotic and steroid creams.    She has ringing in bilateral ears only when she lies down at night.    She has not been getting mamogram , pap smear and has never had a colonoscopy .    Patient  instructed on all aspects of care, plan, and acknowledged. No handouts provided other than those noted above. No barriers to learning other than those noted above. No pain issues identified other than those noted above.      THIS WAS ELECTRONICALLY SIGNED - 04/09/2009 10:00 PM PST BY: Zettie Cooley, MD  ATTENDING    NETWORK INTERNAL MEDICINE                [id number]    D: 04/09/2009 11:56 AM  T: 04/09/2009 03:15 PM  C#: [id number]

## 2009-04-09 NOTE — Telephone Encounter (Signed)
INR is 3.66 stop lovenox continue coumadin repeat INR on 04/11/09   discuused with patient

## 2009-04-09 NOTE — Nursing Note (Signed)
>>   Ian Bushman, MA     Tue Apr 09, 2009 10:14 AM  Patient's identification is verified by name and date of birth. Patient has been seen by another physician at [facility] in [place] on 04/05/2009 since the last visit to Dr.Shariati. Patient denies starting any new medications, OTC medications, herbal medications or supplements, vitamins or home remedies.Patient brought in notes from ER visit.  Ian Bushman MA II

## 2009-04-09 NOTE — Progress Notes (Signed)
Chief Complaint   Patient presents with    Other     Hospital follow up       Dictated history below .           Current outpatient prescriptions   Medication Sig Dispense Refill    Calcium Carbonate-Vit D3-Min (CALTRATE-600 PLUS VITAMIN D3) 600-400 mg-unit PO Tablet Take 1 Tab by mouth. Twice a day  90 Tab  11    Enoxaparin (LOVENOX) 80 mg/0.8 mL SC Syringe Inject 80 mg subcutaneously every 12 hours.    0    Levothyroxine (SYNTHROID) 112 mcg PO Tablet Take  by mouth. Every other day alternating with 125 mcg levothyroxine  30 Tab  6    Levothyroxine (SYNTHROID) 125 mcg PO Tablet Take  by mouth. Every other day alternating with 112 mcg of levothyroxine  30 Tab  6    Simvastatin (ZOCOR) 40 mg PO Tablet Take 1 Tab by mouth every day at bedtime. (cholesterol)  30 Tab  12    Triamcinolone (KENALOG) 0.5 % TOPI Cream Apply  to the affected area 2 times daily. use thin layer  30 g  1    Warfarin (COUMADIN) 5 mg PO Tablet Take 1 Tab by mouth every day.  30 Tab  3    Zolpidem (AMBIEN) 5 mg PO Tablet Take 1 Tab by mouth every day at bedtime if needed for insomnia.  20 Tab  2       History   Social History    Marital Status: MARRIED     Spouse Name: N/A     Number of Children: 2    Years of Education: N/A   Occupational History    used to work with animals    Social History Main Topics    Tobacco Use: Never    Alcohol Use: No    Drug Use: No    Sexually Active: Not on file   Other Topics Concern    Not on file   Social History Narrative    No narrative on file     Vs  BP 110/70  Pulse 90  Resp 16  Ht 1.676 m (5\' 6" )  Wt 83.28 kg (183 lb 9.6 oz)  LMP Postmenopausal    Ros  complete review of symptons was done all other systems are negative except as otherwise stated in HPI    pe    General Appearance: healthy, alert, no distress, pleasant affect, cooperative.  Bilateral ears impacted wax  Neck:  Neck supple. No adenopathy, thyroid symmetric, normal size.  Extremities:  no cyanosis, clubbing, or  edema.      ASSESMENT AND PLAN     415.19AC PE (pulmonary embolism)  Comment: new diagnosis   Started on lovenox 80 mg s/c bid and coumadin 5 mg daily since 6/18  R/o any underlying malignancy or  Thrombophilia,  Discussed increase risk of bleeding with coumadin and interaction with food (she was given food list from the hospital ) and medication   Repeat INR today  Plan: WARFARIN 5 MG TAB, ENOXAPARIN 80 MG/0.8 ML         SUB-Q SYRINGE, MAMMOGRAPHY SCREENING,         GASTROENTEROLOGY REFERRAL, INR, HEMATOLOGY         REFERRAL, ANTICOAGULATION CLINIC REFERRAL,         CARDIOLOGY REFERRAL            427.31 Atrial fibrillation  Comment: secondary to PE  Plan: WARFARIN 5 MG TAB,  ENOXAPARIN 80 MG/0.8 ML         SUB-Q SYRINGE, MAMMOGRAPHY SCREENING,         GASTROENTEROLOGY REFERRAL, INR, HEMATOLOGY         REFERRAL, ANTICOAGULATION CLINIC REFERRAL,         CARDIOLOGY REFERRAL            V76.46F Screening for malignant neoplasm of breast  Comment:   Plan: WARFARIN 5 MG TAB, ENOXAPARIN 80 MG/0.8 ML         SUB-Q SYRINGE, MAMMOGRAPHY SCREENING,         GASTROENTEROLOGY REFERRAL, INR, HEMATOLOGY         REFERRAL            V76.51 Special screening for malignant neoplasms, colon  Comment:   Plan: WARFARIN 5 MG TAB, ENOXAPARIN 80 MG/0.8 ML         SUB-Q SYRINGE, MAMMOGRAPHY SCREENING,         GASTROENTEROLOGY REFERRAL, INR, HEMATOLOGY         REFERRAL            388.30E Tinnitus  Comment:  Plan: WARFARIN 5 MG TAB, ENOXAPARIN 80 MG/0.8 ML         SUB-Q SYRINGE, MAMMOGRAPHY SCREENING,         GASTROENTEROLOGY REFERRAL, INR, HEMATOLOGY         REFERRAL           380.4H Excess ear wax  Comment:  Plan: REMOVE IMPACTED EAR WAX            782.1R Rash  Comment: dermatites   Plan: start kenalog     780.52A Insomnia  Comment:   Plan: ZOLPIDEM 5 MG TAB only as needed            I spent 45 minutes with the patient today over half of which was face to face counselling    I did review patient's past medical and family/social history, no  changes noted.  Barriers to Learning assessed: none. Patient verbalizes understanding of teaching and instructions.  Zettie Cooley MD

## 2009-04-10 ENCOUNTER — Ambulatory Visit: Payer: Self-pay | Admitting: Pharmacist

## 2009-04-10 ENCOUNTER — Other Ambulatory Visit: Payer: Self-pay | Admitting: Pharmacist

## 2009-04-10 DIAGNOSIS — Z7901 Long term (current) use of anticoagulants: Secondary | ICD-10-CM | POA: Insufficient documentation

## 2009-04-11 ENCOUNTER — Ambulatory Visit: Admitting: Internal Medicine

## 2009-04-11 ENCOUNTER — Encounter

## 2009-04-11 ENCOUNTER — Other Ambulatory Visit: Payer: Self-pay | Admitting: Internal Medicine

## 2009-04-11 ENCOUNTER — Ambulatory Visit: Payer: Self-pay | Admitting: Pharmacotherapy

## 2009-04-11 ENCOUNTER — Encounter: Payer: Self-pay | Admitting: Internal Medicine

## 2009-04-11 VITALS — BP 120/72 | HR 78 | Resp 16 | Ht 66.0 in | Wt 181.5 lb

## 2009-04-11 MED ORDER — WARFARIN 1 MG TABLET
ORAL_TABLET | Freq: Every day | ORAL | Status: DC
Start: 2009-04-11 — End: 2009-04-25

## 2009-04-11 NOTE — Nursing Note (Signed)
>>   Ian Bushman, MA     Thu Apr 11, 2009  2:41 PM  Patient's identification is verified by name and date of birth. Patient has not been seen by any other physician or been hospitalized since the last visit to Dr.Shariati. Patient denies starting any new medications, OTC medications, herbal medications or supplements, vitamins or home remedies.

## 2009-04-11 NOTE — Progress Notes (Signed)
Chief Complaint   Patient presents with    Physical       Chief complaint    Tammy [name] is a 68yr old female presents today for physcial exam.      Tammy [name]  presents for preventive care.  Her last physical was many year(s) ago.  In general she has been feeling well and functioning well at home, work, and personal relationships..    Previous Paps have been not done Previous mammograms she declined .     Immunization History   Administered Date(s) Administered    Influenza Vaccine (Fluzone) 08/18/2007    Influenza Virus Vaccine, Split (0.5 Ml) 08/20/2006    Pneumococcal vaccine, Polysaccharide (Pneumovax) 03/08/2008    Tetanus Diptheria (Td - Adult) 03/08/2008    Zoster Vaccine (live) 08/05/2006       Past Medical History   Diagnosis Date    HERPES ZOSTER WITHOUT COMPLICATION 2004       Past Surgical History   Procedure Date    No surgical history        Family History   Problem Relation    Cancer Sister     oral    Heart Sister    Heart Mother       History   Social History    Marital Status: MARRIED     Spouse Name: N/A     Number of Children: 2    Years of Education: N/A   Occupational History    used to work with Physicist, medical    Social History Main Topics    Tobacco Use: Never    Alcohol Use: No    Drug Use: No    Sexually Active: Not on file   Other Topics Concern    Not on file   Social History Narrative    No narrative on file        Current outpatient prescriptions   Medication Sig Dispense Refill    Calcium Carbonate-Vit D3-Min (CALTRATE-600 PLUS VITAMIN D3) 600-400 mg-unit PO Tablet Take 1 Tab by mouth. Twice a day  90 Tab  11    Levothyroxine (SYNTHROID) 112 mcg PO Tablet Take  by mouth. Every other day alternating with 125 mcg levothyroxine  30 Tab  6    Levothyroxine (SYNTHROID) 125 mcg PO Tablet Take  by mouth. Every other day alternating with 112 mcg of levothyroxine  30 Tab  6    Simvastatin (ZOCOR) 40 mg PO Tablet Take 1 Tab by mouth every day at bedtime. (cholesterol)  30 Tab  12     Triamcinolone (KENALOG) 0.5 % TOPI Cream Apply  to the affected area 2 times daily. use thin layer  30 g  1    Warfarin (COUMADIN) 1 mg PO Tablet Take  by mouth every day. As instructed by coumadin clinic  90 Tab  3    Warfarin (COUMADIN) 5 mg PO Tablet Take 1 Tab by mouth every day.  30 Tab  3    Zolpidem (AMBIEN) 5 mg PO Tablet Take 1 Tab by mouth every day at bedtime if needed for insomnia.  20 Tab  2       Vs  BP 120/72  Pulse 78  Resp 16  Ht 1.676 m (5\' 6" )  Wt 82.328 kg (181 lb 8 oz)  LMP Postmenopausal      Ros  complete review of symptons was done all other systems are negative except as otherwise stated in HPI      Exam:  General Appearance: healthy, alert, no distress, pleasant affect, cooperative.  Eyes:  conjunctivae and corneas clear. PERRL, EOM's intact. sclerae normal.  Heart:  normal rate and regular rhythm, no murmurs, clicks, or gallops.  Lungs: chest symmetric with normal A/P diameter, no chest deformities noted, lungs clear to auscultation.  Breast:  normal in size and symmetry, normal contour with no evidence of flattening or dimpling, palpation negative for masses or nodules.  Abdomen: BS normal.  Abdomen soft, non-tender.  No masses or organomegaly.  Extremities:  no cyanosis, clubbing, or edema.  Pelvic:  external genitalia normal, Bartholin's glands, urethra, Skene's glands negative, vaginal mucosa normal, cervix clear, stool negative for occult blood.    ASSESMENT AND PLAN     V76.2D Screening for malignant neoplasm of cervix  (primary encounter diagnosis)  Comment:   Plan: CYTOLOGY GYN            415.19AC PE (pulmonary embolism)  Comment: therapeutic inr   Off Lovenox  Repeat inr done today    Plan:on warfarin 5 mg daily     244.9 UNSPECIFIED ACQUIRED HYPOTHYROIDISM  Comment: on alternating doses of synthyroid   Plan: repeat tsh in 2 months     272.0 Pure hypercholesterolemia  Comment:   Plan: CONTINUE PRESENT MANAGMENT      V70.9X PE (physical exam)  Comment:   Plan:  mammogram /colonoscopy ordered previous visit     V82.81B Osteoporosis screening  Comment:on calcium and vit d  Plan: DEXA, COMPLETE             I did review patient's past medical and family/social history, no changes noted.  Barriers to Learning assessed: none. Patient verbalizes understanding of teaching and instructions.  Zettie Cooley MD

## 2009-04-12 ENCOUNTER — Ambulatory Visit: Payer: Self-pay | Admitting: Pharmacotherapy

## 2009-04-12 ENCOUNTER — Telehealth: Payer: Self-pay | Admitting: Internal Medicine

## 2009-04-12 ENCOUNTER — Ambulatory Visit

## 2009-04-12 NOTE — Nursing Note (Signed)
>>   Tammy [name], [place]     [name] Apr 12, 2009  4:15 PM  Patient is here for a bilateral ear lavage. A mixture of 50% water and 50% Hydrogen Peroxide Solution was used to irrigate both ears. Left ear was irrigated with success and patient tolerated well. Right ear was irrigated with success and tolerated well. Tympanic membrane was visible in both ears with use of an otoscope.  Tammy Bushman MA II

## 2009-04-12 NOTE — Telephone Encounter (Signed)
FYI - [facility] called patient and informed her not to take Coumadin today, take 2 mgs on Saturday, take 5 mgs on Sunday, and on Monday patient needs to have blood test.

## 2009-04-12 NOTE — Telephone Encounter (Signed)
Noted  

## 2009-04-15 ENCOUNTER — Encounter

## 2009-04-15 ENCOUNTER — Ambulatory Visit: Payer: Self-pay | Admitting: Pharmacotherapy

## 2009-04-16 ENCOUNTER — Encounter

## 2009-04-17 ENCOUNTER — Encounter

## 2009-04-18 ENCOUNTER — Ambulatory Visit: Payer: Self-pay | Admitting: Pharmacotherapy

## 2009-04-18 ENCOUNTER — Ambulatory Visit: Admitting: INTERNAL MEDICINE

## 2009-04-18 ENCOUNTER — Telehealth: Payer: Self-pay | Admitting: Internal Medicine

## 2009-04-18 ENCOUNTER — Ambulatory Visit

## 2009-04-18 ENCOUNTER — Encounter

## 2009-04-18 VITALS — BP 120/74 | HR 87 | Temp 98.1°F | Resp 18 | Ht 66.0 in | Wt 180.2 lb

## 2009-04-18 NOTE — Telephone Encounter (Signed)
Patient want Dr.Hundal to know that she's not heard from cardiology yet  Clois Comber SR LVN     [facility] SR [place]

## 2009-04-18 NOTE — Nursing Note (Signed)
>>   Kallie Locks, MA     Thu Apr 18, 2009  1:57 PM  Vital signs taken, allergies verified, screened for pain, med hx taken.  Gennie Alma, MA

## 2009-04-19 ENCOUNTER — Ambulatory Visit: Payer: Self-pay

## 2009-04-19 ENCOUNTER — Encounter

## 2009-04-19 LAB — HOMOCYSTEINE: Homocysteine: 9.5 umol/L (ref 3.8–11.0)

## 2009-04-19 NOTE — Telephone Encounter (Signed)
Please give her the update on cardioloy referral

## 2009-04-19 NOTE — Telephone Encounter (Addendum)
Referral was ordered on 04/09/09 - Per referral guidelines, we need to allow Cardiology 7-10 business working days to process the referral. Today we are at day #9.  Left message with patient's spouse to have patient call me.  If patient does not hear from Cardio by next Wednesday, she can call them @ [phone].    Ethelene Browns, [facility] Referrals Coordinator  [facility]  [phone] Ext [phone]

## 2009-04-23 ENCOUNTER — Encounter

## 2009-04-23 ENCOUNTER — Ambulatory Visit: Payer: Self-pay | Admitting: Pharmacist

## 2009-04-25 ENCOUNTER — Other Ambulatory Visit: Payer: Self-pay | Admitting: Pharmacotherapy

## 2009-04-25 ENCOUNTER — Other Ambulatory Visit: Payer: Self-pay | Admitting: General Practice

## 2009-04-25 ENCOUNTER — Encounter

## 2009-04-25 ENCOUNTER — Ambulatory Visit: Payer: Self-pay | Admitting: Pharmacotherapy

## 2009-04-25 MED ORDER — WARFARIN 1 MG TABLET
ORAL_TABLET | Freq: Every day | ORAL | Status: DC
Start: 2009-04-25 — End: 2009-05-20

## 2009-04-25 MED ORDER — ENOXAPARIN 80 MG/0.8 ML SUBCUTANEOUS SYRINGE
80.0000 mg | INJECTION | Freq: Two times a day (BID) | SUBCUTANEOUS | Status: DC
Start: 2009-04-25 — End: 2009-05-02

## 2009-04-25 NOTE — Consults (Addendum)
PATIENT: Tammy [name], Tammy [name] LOCATIONVelna Ochs   MR #: [id number] SEX: F AGE: 68   DATE OF SERVICE: 04/18/2009 DOB: 1940/11/22    [facility] CONSULTATION    Thank you Dr. Dallie Dad for referring Tammy [name], who is a 68 year old married Caucasian female, retired Surveyor, mining, who is seen following a recent pulmonary embolus for evaluation of a possible clotting diathesis. She, other than being under a fair amount of stress, has been doing fairly well.    1. ? clotting diathesis. The patient has no family history of sudden death or any previous clotting abnormalities. She states that she has had episodes of palpitations over the past 10 to 11 months occurring about two to three times characterized by pounding in her chest. Prior to a trip to [facility] on June 17th she states that she did not feel well and felt fatigued and felt some vague discomfort in her right anterior chest associated with some shortness of breath. She also had some numbness in her anterior chest. She felt that this was possibly related to some trauma of lifting some boxes.    She proceeded with her trip to [facility] and after getting there the following day had progressive shortness of breath and increasing pain in her right anterior chest. She did not note any palpitations, however. She went to the emergency room at [facility] and was found to be in atrial fibrillation and have a pulmonary embolism. The latter being confirmed on a CT scan of her chest. She was started on Lovenox. Was in the hospital for two days and sent home on Coumadin. She drove home. Apparently there is no specific treatment for atrial fibrillation, and I assume that she converted.    She currently states she is not feeling very well. Is tired and somewhat frightened. She denies any ankle edema.    While she was in the hospital they apparently did do a Doppler ultrasound of her legs, and this was negative.    REVIEW OF SYSTEMS:    The patient is right-handed. She has had some  tenderness on the right-hand side. She has some tingling in her left fifth finger. Weight a year ago was greater than 190 pounds. Her appetite has been recently off somewhat. She does walk for exercise. She has been on thyroid replacement for some 28 years. She does get regular mammograms. Did not breastfeed her children. All other systems reviewed are negative.    PAST MEDICAL HISTORY:    No significant childhood illnesses. Denied rheumatic fever or tuberculosis. ADULT MEDICAL ILLNESSES: Had shingles in 2006. She had a varicose vein stripping of her left leg in 1985 uncomplicated. CURRENT MEDICATIONS: Coumadin, simvastatin. She has completed her Lovenox. She is also taking thyroid. ALLERGIES: None known. She is not sure if she has ever been exposed to penicillin before.    FAMILY HISTORY:    Mother died at the age of 2 of myocardial infarction. Was an alcoholic. Father died in a plane crash age 27. One sister had a cancer of the mouth. Apparently died of this at age 69. She was an alcoholic. A son and a daughter and 10 grandchildren all well. There is no family history of any clotting disorder or sudden death or any other neoplasia or blood diseases. No tuberculosis, diabetes, renal disease, or crippling arthritis.    SOCIAL HISTORY:    Born and raised in [place]. Been in [place] for some 16 years. She retired from Herbalist work in 2007. 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 She has traveled to [place] in the past and never was ill. Has a dog and three birds that are well. Used alcohol abusively 28 years ago and states she is an alcoholic without any medical complications. Went to [employer] and has been dry since. Never smoked cigarettes. No history of venereal disease and is in good health.    Today she stated she has had a complete physical examination by her primary pulmonary physician and declined having any examination by me today. Hence, her physical examination was limited  to vital signs. Height was 5 feet 6 inches. Weight is 180 pounds. The pulse is 87 and regular. Blood pressure 120/75. She is afebrile.    IMPRESSION:    1. Pulmonary embolism probably related to atrial fibrillation. Doubt underlying clotting diathesis.    2. Paroxysmal atrial fibrillation.    3. History of hypothyroidism on replacement.    4. History of varicose vein stripping on the left.    5. A history of alcoholism, inactive.    DISPOSITION AND PLAN:    Certain tests for hypercoagulable cannot be done, i.e., antithrombin III, protein S, and protein C, but I can do the others, the anticardiolipin antibodies, tests for the prothrombin gene mutation, homocystine levels, and factor V Leiden mutation. I will have these done today. She is going to see a cardiologist in the near future. I suspect that she will be started on some medications to prevent atrial fibrillation in the future or at least have a Holter monitor. I will see her back after she has had that consultation.    Thank you for this referral. I will keep you posted on any developments.        THIS WAS ELECTRONICALLY SIGNED - 04/25/2009 4:57 PM PST BY: Payton Mccallum, MD  ATTENDING  HEMATOLOGY/ONCOLOGY  INTERNAL MEDICINE                ZOX:[id number]    D: 04/18/2009 03:24 PM  T: 04/19/2009 11:56 AM  C#: [id number]          cc:   Vinnie Langton, MD

## 2009-04-26 ENCOUNTER — Encounter

## 2009-04-26 ENCOUNTER — Ambulatory Visit: Payer: Self-pay | Admitting: Pharmacist

## 2009-04-26 LAB — ANTI-PHOSPHOLIPID AB PANEL
ANTI-CARDIOLIPIN Ab SCREEN: NEGATIVE PL
BETA-2-GLYCOPROTEIN,IGG: 0.2 G UNITS (ref 0–20)
BETA-2-GLYCOPROTEIN,IGM: 1.9 M UNITS (ref 0–20)
DRVVT CONFIRMATION TEST: 47.6 s
DRVVT RATIO: 1.04 RATIO (ref 0.8–1.2)
VIPER VENOM TIME: 49.5 s — ABNORMAL HIGH (ref 23.4–36.1)

## 2009-04-26 LAB — PATHOLOGIST REVIEW, COAG

## 2009-04-29 ENCOUNTER — Telehealth: Payer: Self-pay | Admitting: INTERNAL MEDICINE

## 2009-04-29 ENCOUNTER — Encounter

## 2009-04-29 ENCOUNTER — Ambulatory Visit: Payer: Self-pay | Admitting: Pharmacotherapy

## 2009-04-29 ENCOUNTER — Telehealth: Payer: Self-pay | Admitting: Internal Medicine

## 2009-04-29 NOTE — Telephone Encounter (Signed)
She is calling for the results of lab test done at [facility] 04/18/09.   She would like a phone call back.

## 2009-04-29 NOTE — Telephone Encounter (Signed)
Patient called and stated that she had a shot of medication (Lovenox ?) on 04/23/09 and 04/24/09 with her coumadin medication at the [facility].  Patient started itching all over, especially on her stomach for the past couple of days.  Please call patient and advise.

## 2009-04-29 NOTE — Telephone Encounter (Addendum)
Patient is scheduled with Dr. Nedra Hai on 05/02/09.     Ethelene Browns, [facility] Referrals Coordinator  [facility]  [phone] Ext [phone]

## 2009-04-29 NOTE — Telephone Encounter (Addendum)
Patient notified and coming in tomorrow to see Dr. Audley Hose

## 2009-04-29 NOTE — Telephone Encounter (Signed)
Please advise 

## 2009-04-29 NOTE — Telephone Encounter (Signed)
Appointment tomorrow please.  Ok to double book in the morning.

## 2009-04-30 ENCOUNTER — Other Ambulatory Visit: Payer: Self-pay | Admitting: Internal Medicine

## 2009-04-30 ENCOUNTER — Ambulatory Visit: Admitting: Internal Medicine

## 2009-04-30 VITALS — BP 126/76 | HR 72 | Ht 66.0 in | Wt 180.0 lb

## 2009-04-30 MED ORDER — PERMETHRIN 5 % TOPICAL CREAM
TOPICAL_CREAM | Freq: Once | TOPICAL | Status: DC
Start: 2009-04-30 — End: 2009-05-09

## 2009-04-30 MED ORDER — WARFARIN 1 MG TABLET
1.0000 mg | ORAL_TABLET | Freq: Every day | ORAL | Status: DC
Start: 2009-04-30 — End: 2009-05-09

## 2009-04-30 NOTE — Nursing Note (Signed)
>>   Donah Driver, LVN     Tue Apr 30, 2009  8:37 AM  Patients I D established, screened for pain,vitals taken,allegies reviewed,medications reviewed. Patient having troubles with her new anticoagulation meds  Clois Comber SR LVN

## 2009-04-30 NOTE — Progress Notes (Signed)
Tammy [name] is a 68yr old female who presents for follow up and evaluation of her multiple medical problems.  Cardiovascular risk analysis -   LDL goal is under 130.   ROS: taking medications as instructed, no TIA's, no chest pain on exertion, no dyspnea on exertion, no swelling of ankles.   Lab review: no lab studies available for review at time of visit.  Has been itchy with the lovenox.  Took it on the 7th and the 8th.  inr now 2.68 . Itching now for a couple of days quite significantly.    Patient's past medical, family and social history reviewed and unchanged.    The physical exam is generally normal. Patient appears well, alert and oriented x 3, pleasant, and cooperative. Vitals are as noted by nurse. Neck supple and free of adenopathy, or masses. No thyromegally. No carotid bruits.  Ears normal.  Nose and throat are normal. Chest has good air exchange.  Heart sounds are regular rate and rhythm with no clicks, gallops or rubs.  Abdomen is soft, no tenderness, masses or organomegaly.  Peripheral pulses are normal. Skin is noted to have escoriation and bruising from itching the papular lesions.  No pretibial edema noted.    IMPRESSION:  415.19AC PE (pulmonary embolism)  (primary encounter diagnosis)  Comment:   Plan: on coumadin    272.0 Pure hypercholesterolemia  Comment:   Plan: on treatment.    427.31 Atrial fibrillation  Comment:   Plan: may need on going coumadin even after pe is treated.    V58.61 Encounter for long-term (current) use of anticoagulants  Comment:   Plan:     Reash-cannot rule out scabies. After informed written consent was obtained, using Betadine for cleansing and 1% Lidocaine with epinephrine for anesthetic, with sterile technique a 5 mm punch biopsy was used to obtain a biopsy specimen of the lesion. Hemostasis was obtained by pressure and wound was not sutured. Antibiotic dressing is applied, and wound care instructions provided. Be alert for any signs of cutaneous infection. The  specimen is labeled and sent to pathology for evaluation. The procedure was well tolerated without complications.  Will treat for rscibes as well. See patient instructions.        I reviewed  patient's past medical and family/social history, no changes.  Barriers to Learning assessed: none. Patient verbalizes understanding of teaching and instructions.  Maryelizabeth Kaufmann, MD

## 2009-04-30 NOTE — Patient Instructions (Signed)
Elimite tonight.   Claritin one in morning and one in the afternoon.  Benadryl at bed time.  pepcid-one daily.

## 2009-05-01 ENCOUNTER — Encounter

## 2009-05-01 ENCOUNTER — Telehealth: Payer: Self-pay | Admitting: Internal Medicine

## 2009-05-01 ENCOUNTER — Ambulatory Visit: Payer: Self-pay | Admitting: Pharmacotherapy

## 2009-05-01 NOTE — Telephone Encounter (Signed)
Patient was seen yesterday, and Dr. Audley Hose did a biopsy on her stomach.  Patient reports that her stomach began hemmoraging later that day, so went to the ER.  Patient states the ER cautarized it to stop the bleeding, and asked the patient why Dr. Audley Hose cut that deeply when the patient is on coumadin?  Patient states she did not know how to answere the question.  Patient has appointment to see Dr. Parks Neptune on 8/3, but wants to know if, under the circumstances, Dr. Parks Neptune would rather see her sooner.  Patient would also like to know when the results will be ready?

## 2009-05-01 NOTE — Progress Notes (Addendum)
The  patient visit encompassed 45 minutes of which half was spent in counselling.

## 2009-05-01 NOTE — Telephone Encounter (Signed)
Patient needs answer about her f/u  Clois Comber SR LVN

## 2009-05-02 ENCOUNTER — Ambulatory Visit: Admitting: Cardiovascular Disease

## 2009-05-02 NOTE — Telephone Encounter (Signed)
The biopsy results should be back next week   She can follow up with Dr Audley Hose or she can wait till [facility] and i will pass the message to Dr Audley Hose

## 2009-05-02 NOTE — Nursing Note (Signed)
>>   Rose Fillers, LVN     Thu May 02, 2009 12:42 PM  EKG done per MD order.  Leanor Rubenstein SR LVN    >> Rose Fillers, [place]     Thu May 02, 2009 12:40 PM  Patient identified by name and date of birth, vital signs taken, allergies verified, screened for pain, medications verified. Patient has been asked if they are taking any OTC or herbal medications, vitamins, supplements or home remedies. Also asked if they have been hospitalized since last visit.  Patient was seen in [facility] in [place], June 19,2010, for blood clot in right lung.                Leanor Rubenstein,  Sr. LVN

## 2009-05-02 NOTE — Progress Notes (Signed)
HPI: See dictated portion of report.         Current outpatient prescriptions   Medication Sig    Calcium Carbonate-Vit D3-Min (CALTRATE-600 PLUS VITAMIN D3) 600-400 mg-unit PO Tablet Take 1 Tab by mouth. Twice a day    Cholecalciferol, Vitamin D3, (VITAMIN D) 2,000 unit PO Cap Take 1 Tab by mouth every day.    Enoxaparin (LOVENOX) 80 mg/0.8 mL SC Syringe Inject 80 mg subcutaneously every 12 hours.    Levothyroxine (SYNTHROID) 112 mcg PO Tablet Take  by mouth. Every other day alternating with 125 mcg levothyroxine    Levothyroxine (SYNTHROID) 125 mcg PO Tablet Take  by mouth. Every other day alternating with 112 mcg of levothyroxine    MULTIVITS W-CA,FE,OTHER MIN (ONE-A-DAY WOMENS FORMULA PO) Take 1 Tab by mouth every day.    Permethrin (ELIMITE) 5 % TOPI Cream Apply  to the affected area one time. apply from head to toe leave on for 8 -14 hours, then rinse off    Simvastatin (ZOCOR) 40 mg PO Tablet Take 40 mg by mouth every day at bedtime.   (cholesterol)    Triamcinolone (KENALOG) 0.5 % TOPI Cream Apply  to the affected area 2 times daily. use thin layer    Warfarin (COUMADIN) 1 mg PO Tablet Take  by mouth every day. As instructed by coumadin clinic    Warfarin (COUMADIN) 1 mg PO Tablet Take 1 Tab by mouth every day.    Zolpidem (AMBIEN) 5 mg PO Tablet Take 1 Tab by mouth every day at bedtime if needed for insomnia.         No Known Allergies          Past medical history:  CNS: no stroke, no TIA;  EYES: no vision impairment,  no  cataract;  no left / right eye cataract surgery. no macular degeneration;  ENT: occasionally tinnitus, no vertigo,  no hearing impairment. no hearing aid  ENDOCRINE: no hyperthyroidism, has hypothyroidism, no diabetes.  PULMONARY: no asthma, no COPD. no TB.  CARDIAC: no  history of myocardial infarction.   no history of congestive hearty failure.  no history of rheumatic fever, no scarlet fever. no history of hypertension.     GI: no peptic ulcer disease. no history of GI  bleed. no GERD.  GU: no current UTI.  no  renal insufficiency. no prior kidney surgery. no kidney stone;  DERMATOLOGY: no history of skin cancer.  Had rash. no psoriasis. [place] 5 y ago  HEMATOLOGY: no anemia.  no leukemia.  HYPERLIPIDEMIA: on Rx  MUSCULOSKELETAL: no arthritis    Past surgical history:  9 y ao; left leg vein      Personal history and habits:  never smoked  Alcohol use: none for 28 yrs  Marital status: married   Children: 2   Occupation: retired Airline pilot  History of other/recreational drug use: no      Family History:  1) Myocardial infarction/ Coronary artery disease: mother MI dead 56 y/o  2) Diabetes: no  3) Hypertension:no  4) CVA: no      Review of systems:  Constitutional:  ;   Weight has been steady;  CNS: no syncope episode(s),  no lightheadedness  /  dizziness with  orthostatic / positional  Changes;  MENTAL: no depression,   under stress;  Eyes: no amaurosis fugax, no diplopia: has eye glasses; no history of cataract surgery;  ENT:  has tinnitus, no vertigo,   ENDOCRINE: no diabetes; hypo thyroid disorder;  PULMONARY: no hemoptysis, no orthopnea, no PND; has exertional dyspneic symptoms; no cough;   CARDIOVASCULAR: had chest pain / ache ; had palpitation / skipped beats / extrasystoles;  GI: no melena; no hematochezia; no abdominal pain;  GU: no dysuria, no hematuria, no nocturia;  Extremities/vascular: no left / right dependent pedal edema; no intermittent claudication; no stasis dermatitis;  had left / right lower extremity venous varicosity  MUSCULOSKELETAL: no arthritis, no myalgia;   DERMATOLOGIC: no rash, no itching;  HEMATOLOGIC: no history of anemia , no history of leukemia;         PHYSICAL EXAMINATION:  HEAD: normocephalic.  no trauma  EYES: pupils were equal and reactive to light.  Extraocular muscle intact.  Conjunctiva pink. Non icteric sclera. no arcus senilis.  ENT: no ear canal drainage; moist oral mucosa; no hearing aid  NECK: no neck vein distension.  Left and right  carotid pulses were equal.  no left / right carotid bruit.  LUNGS:  Normal left / right lungs breath sounds.  no left / right basilar rales. no left / right lung rhonchi.  no wheezes.  CHEST:  no chest wall pain.    CARDIAC:   normal first heart sound.    normal second heart sounds.   regular rhythm. no extrasystoles.  There was no murmur.   No gallop.  no lift or thrill.  ABDOMEN: No mass, no pain. No tenderness, no pulsatile mass. no  abdominal bruit.  EXTREMITIES: no left / right  pedal edema. no left / right pretibial edema.  no stasis dermatitis changes . Left and right pedal pulses were 1-2+.   There is   left and right lower leg venous varicosity.  MUSCULOSKELETAL:  mild kyphosis;  SKIN: no rash. no ecchymosis spots .  NEURO: Cranial nerves 2 - 12 intact.  no   focal extremity weakness.      DATA REVIEWED:   Component        Latest Reference Range  04/15/2009 04/17/2009 04/18/2009 04/19/2009   VIPER VENOM TIME      23.4 - 36.1 SEC   49.5 (H)    DRVVT CONFIRMATION TEST      SEC   47.6    DRVVT RATIO      0.8 - 1.2 RATIO   1.04    ANTI-CARDIOLIPIN AB SCREEN      Negative   NEGATIVE    BETA-2-GLYCOPROTEIN,IGG      0 - 20 G UNITS   0.2    BETA-2-GLYCOPROTEIN,IGM      0 - 20 M UNITS   1.9    INTERPRETATION, COAG         See Comment    FACULTY ATTESTATION, COAG         PI [id number]    TSH (SENSITIVE)      0.35 - 4.00 mcIU/mL 1.20      INR      0.75 - 1.19  4.07 (H)  2.75 (H)   HOMOCYSTEINE      3.8 - 11.0 umol/L   9.5       Component        Latest Reference Range  04/23/2009 04/25/2009 04/26/2009 04/29/2009   VIPER VENOM TIME      23.4 - 36.1 SEC       DRVVT CONFIRMATION TEST      SEC       DRVVT RATIO      0.8 - 1.2 RATIO       ANTI-CARDIOLIPIN  AB SCREEN      Negative       BETA-2-GLYCOPROTEIN,IGG      0 - 20 G UNITS       BETA-2-GLYCOPROTEIN,IGM      0 - 20 M UNITS       INTERPRETATION, COAG             FACULTY ATTESTATION, COAG             TSH (SENSITIVE)      0.35 - 4.00 mcIU/mL       INR      0.75 - 1.19 1.34 (H) 1.54 (H) 2.01  (H) 2.68 (H)   HOMOCYSTEINE      3.8 - 11.0 umol/L          Component        Latest Reference Range  05/01/2009   VIPER VENOM TIME      23.4 - 36.1 SEC    DRVVT CONFIRMATION TEST      SEC    DRVVT RATIO      0.8 - 1.2 RATIO    ANTI-CARDIOLIPIN AB SCREEN      Negative    BETA-2-GLYCOPROTEIN,IGG      0 - 20 G UNITS    BETA-2-GLYCOPROTEIN,IGM      0 - 20 M UNITS    INTERPRETATION, COAG          FACULTY ATTESTATION, COAG          TSH (SENSITIVE)      0.35 - 4.00 mcIU/mL    INR      0.75 - 1.19 2.56 (H)   HOMOCYSTEINE      3.8 - 11.0 umol/L      EKG was performed today and reviewed by me.      Assessments and  Diagnostic / treatment plans:  See dictated report.        Electronically signed by:    Delman Kitten MD, [facility], FACP  Attending Physician  [facility]  [address]  [place], [place] [address]

## 2009-05-03 ENCOUNTER — Encounter

## 2009-05-03 ENCOUNTER — Ambulatory Visit: Payer: Self-pay | Admitting: Pharmacotherapy

## 2009-05-03 NOTE — Telephone Encounter (Signed)
The biopsie needed to be deep inorder to figure out why the rash is present and if the scabies were inbedded.  Being on coumadin always makes patient bleed more.  Im glad to hear that the er was able to cautirize the bleeding. Keep appointment next week as schedule.  Hope that the rash is improving.

## 2009-05-06 ENCOUNTER — Encounter

## 2009-05-06 ENCOUNTER — Ambulatory Visit: Payer: Self-pay | Admitting: Pharmacotherapy

## 2009-05-06 NOTE — Telephone Encounter (Addendum)
Spoke to pt "she will decide whether to follow up with Dr. Audley Hose or dr.hundal on Thursday." B Benna Dunks L.V.N

## 2009-05-07 ENCOUNTER — Ambulatory Visit: Admitting: Internal Medicine

## 2009-05-07 ENCOUNTER — Other Ambulatory Visit: Payer: Self-pay | Admitting: Cardiovascular Disease

## 2009-05-07 NOTE — Progress Notes (Signed)
Tammy [name] is a 68yr old female who is here for follow up of her rash. She is doing much better with the rash.  She is responding to the treatment.  She is very angry that she had to have such a "deep" cut and that she bled and had to go to the er for cauterization. She was very angry that the procedure was done while on coumadin.  She is very angry at her self also for not having discussed more about this before proceeding with the biopsie.  She is improving however.  She also thought that the pepcid was given to her for her stomach when it was explained to her that it was used for antihistamin properties.  Today She just seemed too upset to have a two way discussion and walked out of the office when I notified her that the biopsie did not help in figuring out why the rash was present. She did not wait for me to educate her or explain why the biopsie was needed.  She was upset also at the PRESCRIPTION that was sent to the pharmacy when I sent the PRESCRIPTION that  she requested.      Gen-well in no acute distress.  HEENT-non focal  CV-RRR nl S1 S2  Lungs-clear to auscultation  LE-no cyanosis clubing or edema    IMPRESSION:  782.1R Rash  Comment:   Plan: improving.  Follow up with dr. Parks Neptune on the 22nd as scheduled.    415.19AC PE (pulmonary embolism)  Comment:   Plan: on coumadin.    799.22L Feeling angry  Comment:   Plan: I attempted to educate her on the decision for the biopsie. Which was to try to help her figure out the etiology of the rash. She did not wait and walked out of the office. Coumadin is not a contraindication to doing superficial biopsies.    I have discussed this with dr. Parks Neptune who knows the patient better and will continue to have discussions with the patient etc.    I reviewed  patient's past medical and family/social history, no changes.  Barriers to Learning assessed: none. Patient verbalizes understanding of teaching and instructions.  Maryelizabeth Kaufmann, MD

## 2009-05-07 NOTE — Nursing Note (Signed)
>>   Emerson Monte, LVN     Tue May 07, 2009 10:38 AM  Vital signs taken, allergies verified, screened for pain, med hx taken. Patients have been asked are you taking any OTC medication, herbal meds or supplements, vitamins or home remedies. Two identifiers have been used, name and date of birth. B Barber L.V.N

## 2009-05-08 ENCOUNTER — Ambulatory Visit: Payer: Self-pay | Admitting: Pharmacotherapy

## 2009-05-08 ENCOUNTER — Encounter

## 2009-05-09 ENCOUNTER — Ambulatory Visit: Admitting: Internal Medicine

## 2009-05-09 ENCOUNTER — Ambulatory Visit

## 2009-05-09 VITALS — BP 118/80 | HR 66 | Resp 16 | Ht 66.0 in | Wt 176.5 lb

## 2009-05-09 NOTE — Progress Notes (Addendum)
PATIENT: Tammy [name], Tammy [name] LOCATIONRulon Sera   MR #: [id number] SEX: F AGE: 68  DATE OF SERVICE: 05/09/2009 DOB: 1941/04/08    [place] [facility] NOTE    This is a 68 year old female who comes in for follow up of multiple medical concerns. Recently she was evaluated by Dr. Audley Hose for a rash . She had a skin biopsy done. As she is on coumadin for her recent pulmonary embolism that evening she developed bleeding and subsequently was seen in the ER and had a cauterization done. She is very angry and upset about it as the ER doctor told her "why was skin biopsy done when she is coumadin ". According to her, she was not fully explained the procedure for a skin biopsy and she thought that it was just a scraping of the skin lesion and never thought that it was going to be deep cut . She did follow up with Dr. Audley Hose but she was too angry to discuss anything about it. She is also upset that she sent the wrong prescription of Coumadin to the pharmacy. The Coumadin says 1mg  to be taken daily but she states she is always instructed by the Coumadin clinic as to how much Coumadin she needs to take. Her rash is actually better and the skin biopsy was negative .      Patient instructed on all aspects of care, plan, and acknowledged. No handouts provided other than those noted above. No barriers to learning other than those noted above. No pain issues identified other than those noted above.        THIS WAS ELECTRONICALLY SIGNED - 05/09/2009 8:43 PM PST BY: Zettie Cooley, MD  ATTENDING    NETWORK INTERNAL MEDICINE                NU:[id number])    D: 05/09/2009 12:27 PM  T: 05/09/2009 12:49 PM  C#: [id number]

## 2009-05-09 NOTE — Consults (Addendum)
PATIENT: Tammy [name], Tammy [name] LOCATIONValarie Merino   MR #: [id number] SEX: F AGE: 67   DATE OF SERVICE: 05/02/2009 DOB: [birth year]    [place] CONSULTATION    HPI:    Patient is a 68 year old white female referred by Dr. Riley Kill for assessment of cardiac dysrhythmia.    On April 04, 2009, patient was lifting some heavy boxes and experienced right-sided chest pain with shortness of breath. When she pressed on it, it felt better. Patient thought it was musculoskeletal in component. On April 05, 2009, patient noted increase in chest pain symptoms and shortness of breath. Patient traveled to [facility] and hospitalized at [facility] after evaluation showed patient to be in atrial fibrillation with rapid ventricular response and CT of the chest showed pulmonary embolism the medial base of the right lower lobe segment, pulmonary artery, right lower lobe atelectasis, and old infiltrate and right pleural effusion. Patient was converted to sinus rhythm with intravenous Cardizem and patient improved. Patient's assessment had [facility] in [place], [place], by the admitting physician was that the pulmonary emboli most likely was secondary to possible recent flight versus underlying genetic disorder, underlying malignancy. Patient was started on subcu Lovenox and warfarin.     Echocardiogram was performed on April 06, 2009, which showed normal left ventricular size and LVEF of 63%. There was pseudo normal pattern with diastolic filling, normal right ventricular size, and normal RV systolic function, normal cardiac valves and no pericardial effusion. Patient recalled sensation of cardiac palpitation a few hours in late March this year.    Patient drove home on April 08, 2009, and saw Dr. Parks Neptune. Patient was continued on warfarin and subcu Lovenox until Pro Time had been therapeutic when subcu Lovenox was discontinued.     Patient had rash and itching on the abdomen last week. Patient saw Dr. Leatrice Jewels and had biopsy of the rash.  Patient subsequently had some bleeding at the biopsy site and was evaluated in the emergency room at [facility] and the area was cauterized. Patient still had some residual itching.    Over the last 10 months, patient had periods of cardiac palpitations, more noticeable when she was supine, and when this happened there was sensation that heart was bouncing and was coming out of patient's chest. Over the last one month, patient noticed some intermittent palpitations. Patient also had exertional dyspneic symptoms over the last one year. Patient averaged two cups of coffee in the morning. She has not used alcohol for the last 28 years and was a nonsmoker.    I reviewed the electrocardiogram today, which showed sinus rhythm and was within normal limits.    I reviewed the electrocardiogram dated April 05, 2009, when patient was hospitalized at [facility] in [place], [place], and it showed atrial fibrillation with rapid ventricular response with rate of 167 beats per minute and nonspecific ST changes, left axis.    ASSESSMENT AND PLAN:     1. Paroxysmal atrial fibrillation and this could be source of patient's pulmonary embolism.  2. Hypothyroidism.  3. History of chest pain symptoms likely related to pulmonary embolism but co-exisiting coronary artery disease needs to be excluded. Chest pain may be precipitated by tachycardia if patient has underlying coronary artery disease.    Patient's medications were reviewed. Patient will continue on warfarin anticoagulation. Patient will be scheduled for a stress echocardiogram for further assessment of chest pain symptoms. I will reassess the patient after stress echocardiogram.    Patient  instructed on all aspects of care, plan, and acknowledged. No handouts provided other than those noted above.  No barriers to learning other than those noted above. No pain issues identified other than those noted above.      THIS WAS ELECTRONICALLY SIGNED -  05/09/2009 6:15 AM PST BY: Delman Kitten, MD  ASSISTANT CLINICAL PROFESSOR  [facility]  NETWORK INTERNAL MEDICINE                WGN:[id number])    D: 05/06/2009 11:04 PM  T: 05/07/2009 07:08 AM  C#: [id number]

## 2009-05-09 NOTE — Nursing Note (Signed)
>>   Tammy Bushman, MA     Thu May 09, 2009  8:58 AM  Patient's identification is verified by name and date of birth. Patient has  been seen by another physician at [facility] ER  since the last visit to Dr.Shariati. Patient denies starting any new medications, OTC medications, herbal medications or supplements, vitamins or home remedies.

## 2009-05-09 NOTE — Progress Notes (Signed)
Chief Complaint   Patient presents with    Other     follow up , blood clot       Dictated history below .       Current outpatient prescriptions   Medication Sig Dispense Refill    Calcium Carbonate-Vit D3-Min (CALTRATE-600 PLUS VITAMIN D3) 600-400 mg-unit PO Tablet Take 1 Tab by mouth. Twice a day  90 Tab  11    Cholecalciferol, Vitamin D3, (VITAMIN D) 2,000 unit PO Cap Take 1 Tab by mouth every day.        Levothyroxine (SYNTHROID) 112 mcg PO Tablet Take  by mouth. Every other day alternating with 125 mcg levothyroxine  30 Tab  6    Levothyroxine (SYNTHROID) 125 mcg PO Tablet Take  by mouth. Every other day alternating with 112 mcg of levothyroxine  30 Tab  6    MULTIVITS W-CA,FE,OTHER MIN (ONE-A-DAY WOMENS FORMULA PO) Take 1 Tab by mouth every day.        Simvastatin (ZOCOR) 40 mg PO Tablet Take 40 mg by mouth every day at bedtime.   (cholesterol)  30 Tab  12    Warfarin (COUMADIN) 1 mg PO Tablet Take  by mouth every day. As instructed by coumadin clinic  90 Tab  3       History   Social History    Marital Status: MARRIED     Spouse Name: N/A     Number of Children: 2    Years of Education: N/A   Occupational History    used to work with animals    Social History Main Topics    Tobacco Use: Never    Alcohol Use: No    Drug Use: No    Sexually Active: Not on file   Other Topics Concern    Not on file   Social History Narrative    No narrative on file     Vs  BP 118/80  Pulse 66  Resp 16  Ht 1.676 m (5\' 6" )  Wt 80.06 kg (176 lb 8 oz)  LMP Postmenopausal    Ros  complete review of symptons was done all other systems are negative except as otherwise stated in HPI    pe    General Appearance: healthy, alert, no distress, pleasant affect, cooperative.  Abdomen: BS normal.  Abdomen soft, non-tender.  No masses or organomegaly.left side biopsy site is clean and healing   Extremities:  no cyanosis, clubbing, or edema.      ASSESMENT AND PLAN     782.1R Rash  (primary encounter diagnosis)  Comment:  better   Plan: she is very angry and upset at Dr Audley Hose regarding her skin biopsy as he she was unware that it involved deep cutting  She says if she was fully  explained the procedure before she would have never consented to it .    272.0 Pure hypercholesterolemia  Comment:   Plan: CONTINUE PRESENT MANAGMENT      244.9 UNSPECIFIED ACQUIRED HYPOTHYROIDISM  Comment:   Plan: CONTINUE PRESENT MANAGMENT      415.19AC PE (pulmonary embolism)  Comment:   Plan: on treatment with coumadin     I spent 25 minutes with the patient today over half of which was face to face counselling       I did review patient's past medical and family/social history, no changes noted.  Barriers to Learning assessed: none. Patient verbalizes understanding of teaching and instructions.  Zettie Cooley MD

## 2009-05-10 ENCOUNTER — Encounter

## 2009-05-10 ENCOUNTER — Ambulatory Visit: Payer: Self-pay | Admitting: Pharmacist

## 2009-05-13 ENCOUNTER — Encounter: Admitting: Gastroenterology

## 2009-05-13 ENCOUNTER — Encounter

## 2009-05-13 ENCOUNTER — Ambulatory Visit: Payer: Self-pay | Admitting: Pharmacotherapy

## 2009-05-15 ENCOUNTER — Ambulatory Visit: Payer: Self-pay | Admitting: Pharmacotherapy

## 2009-05-15 ENCOUNTER — Encounter

## 2009-05-17 ENCOUNTER — Encounter

## 2009-05-17 ENCOUNTER — Ambulatory Visit: Payer: Self-pay | Admitting: Pharmacotherapy

## 2009-05-20 ENCOUNTER — Encounter

## 2009-05-20 ENCOUNTER — Ambulatory Visit

## 2009-05-20 ENCOUNTER — Other Ambulatory Visit: Payer: Self-pay | Admitting: Pharmacotherapy

## 2009-05-20 ENCOUNTER — Ambulatory Visit: Payer: Self-pay | Admitting: Pharmacotherapy

## 2009-05-20 MED ORDER — WARFARIN 1 MG TABLET
3.0000 | ORAL_TABLET | Freq: Every day | ORAL | Status: DC
Start: 2009-05-20 — End: 2009-11-26

## 2009-05-20 NOTE — Telephone Encounter (Signed)
Sent by Ethell Blatchford L. Carly Applegate, Pharm.D, BCPS

## 2009-05-23 ENCOUNTER — Ambulatory Visit: Admitting: INTERNAL MEDICINE

## 2009-05-23 ENCOUNTER — Other Ambulatory Visit: Payer: Self-pay | Admitting: INTERNAL MEDICINE

## 2009-05-23 VITALS — BP 130/70 | HR 78 | Temp 97.4°F | Resp 20 | Ht 66.0 in | Wt 181.7 lb

## 2009-05-23 NOTE — Nursing Note (Signed)
>>   Hal Neer, LVN     Thu May 23, 2009  8:54 AM  Vital signs taken, allergies verified, screened for pain, med hx taken. Verified patient identity x2. Hal Neer LVN

## 2009-05-24 ENCOUNTER — Encounter

## 2009-05-24 NOTE — Progress Notes (Addendum)
PATIENT: Tammy [name], Tammy [name] LOCATIONVelna Ochs   MR #: [id number] SEX: F AGE: 68   DATE OF SERVICE: 05/23/2009 DOB: 19-Dec-1940    [facility] NOTE    DIAGNOSES:    1. Pulmonary embolism, associated positivity for lupus anticoagulant. 2. Paroxysmal atrial fibrillation.  3. History of hypothyroidism, on replacement.  4. History of varicose vein stripping on the left. 5. History of alcoholism, inactive.    [name] is in for followup of her possible hypercoagulable state and relates in the interim she saw a cardiologist and had a stress test, but not a Holter monitor, and is under observation. She apparently was briefly back on Lovenox and had a rash consequent to this. She had a biopsy with some major subsequent bleeding. Biopsy apparently was negative and she had to go to the emergency room to have her bleeding stopped. This is largely resolved although she does apparently have a nodule underneath where the bleeding occurred.    She was treated briefly with Benadryl topical steroid.    Currently she denies any chest pain or shortness of breath. She does state she has occasional twinges in her chest which last only seconds.    On laboratory studies, the homocysteine level was normal. 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    This may be in part what made her have a tendency toward coagulation, although this is not entirely clear and in and of itself does not put her at excessive risk. Hence, I would recommend that she have anticoagulation for some six months and then she can have the Coumadin discontinued. Thereafter, should she have a second episode, she would be obligated to be on lifelong anticoagulation. Unfortunately her tests for the prothrombin-G mutation and factor V Leiden were not done and I will order those today and test for antithrombin III, protein S and C. We will have to await when she is off  Coumadin.        THIS WAS ELECTRONICALLY SIGNED - 05/24/2009 3:25 PM PST BY: Payton Mccallum, MD  ATTENDING  HEMATOLOGY/ONCOLOGY  INTERNAL MEDICINE                JYN:[id number]    D: 05/23/2009 09:28 AM  T: 05/23/2009 11:50 AM  C#: [id number]

## 2009-05-27 ENCOUNTER — Encounter

## 2009-05-27 ENCOUNTER — Ambulatory Visit: Payer: Self-pay | Admitting: Pharmacist

## 2009-05-28 ENCOUNTER — Encounter: Admitting: Internal Medicine

## 2009-05-28 LAB — PROTHROMBIN MUTATION (G20210A)

## 2009-05-28 LAB — FACTOR V MUTATION (LEIDEN)

## 2009-05-31 ENCOUNTER — Encounter

## 2009-06-03 ENCOUNTER — Ambulatory Visit: Payer: Self-pay | Admitting: Pharmacist

## 2009-06-03 ENCOUNTER — Encounter

## 2009-06-05 ENCOUNTER — Telehealth: Payer: Self-pay | Admitting: INTERNAL MEDICINE

## 2009-06-05 NOTE — Telephone Encounter (Signed)
She is calling for the results of lab test done at [facility] 05/23/09.   She would like a phone call back. Patient states she will be leaving her home at 2:30. Please advise.

## 2009-06-05 NOTE — Telephone Encounter (Signed)
Pateint notified Dr. Romeo Apple is out of the office today and I will forward the message. Patient also notified that her PCP can review the results and discuss with the patient.

## 2009-06-07 ENCOUNTER — Encounter

## 2009-06-10 ENCOUNTER — Encounter

## 2009-06-10 ENCOUNTER — Ambulatory Visit: Payer: Self-pay | Admitting: Pharmacotherapy

## 2009-06-10 MED ORDER — WARFARIN 1 MG TABLET
3.0000 mg | ORAL_TABLET | Freq: Every day | ORAL | Status: DC
Start: 2009-06-10 — End: 2009-10-10

## 2009-06-11 ENCOUNTER — Ambulatory Visit: Admitting: Internal Medicine

## 2009-06-11 VITALS — BP 138/70 | HR 76 | Resp 16 | Ht 66.0 in | Wt 181.3 lb

## 2009-06-11 NOTE — Nursing Note (Signed)
>>   Tammy Bushman, MA     Tue Jun 11, 2009  9:15 AM  Patient's identification is verified by name and date of birth. Patient has not been seen by any other physician or been hospitalized since the last visit to Dr.Hundal. Patient denies starting any new medications, OTC medications, herbal medications or supplements, vitamins or home remedies.

## 2009-06-11 NOTE — Progress Notes (Signed)
Chief Complaint   Patient presents with    Other     1 month follow up       Chief complaint    Tammy [name] is a 68yr old female presents today with this medical concerns   She  Had blood tests done for hypercoagulable state her  homocysteine level was normal.  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  She was advised anticoagulation for 6 months   She has history of PAF .She had exercise stress test with Dr Nedra Hai and she has follow up to discuss       Current outpatient prescriptions   Medication Sig Dispense Refill    Calcium Carbonate-Vit D3-Min (CALTRATE-600 PLUS VITAMIN D3) 600-400 mg-unit PO Tablet Take 1 Tab by mouth. Twice a day  90 Tab  11    Levothyroxine (SYNTHROID) 112 mcg PO Tablet Take  by mouth. Every other day alternating with 125 mcg levothyroxine  30 Tab  6    Levothyroxine (SYNTHROID) 125 mcg PO Tablet Take  by mouth. Every other day alternating with 112 mcg of levothyroxine  30 Tab  6    MULTIVITS W-CA,FE,OTHER MIN (ONE-A-DAY WOMENS FORMULA PO) Take 1 Tab by mouth every day.        Simvastatin (ZOCOR) 40 mg PO Tablet Take 40 mg by mouth every day at bedtime.   (cholesterol)  30 Tab  12    Warfarin (COUMADIN) 1 mg Tablet Take 3-4 Tabs by mouth every day. As instructed by coumadin clinic  100 Tab  3    Warfarin (COUMADIN) 1 mg Tablet Take 3-4 Tabs by mouth every day. Or as directed  120 Tab  5       History   Social History    Marital Status: MARRIED     Spouse Name: N/A     Number of Children: 2    Years of Education: N/A   Occupational History    used to work with animals    Social History Main Topics    Tobacco Use: Never    Alcohol Use: No    Drug Use: No    Sexually Active: Not on file   Other Topics Concern    Not on file   Social History Narrative    No narrative on file     VS  BP 138/70  Pulse 76  Resp 16  Ht 1.676 m  (5\' 6" )  Wt 82.237 kg (181 lb 4.8 oz)    ROS  complete review of symptons was done all other systems are negative except as otherwise stated in HPI      PE  General Appearance: healthy, alert, no distress, pleasant affect, cooperative.  Neck:  Neck supple. No adenopathy, thyroid symmetric, normal size.  Extremities:  no cyanosis, clubbing, or edema.    ASSESMENT AND PLAN     244.9 UNSPECIFIED ACQUIRED HYPOTHYROIDISM  (primary encounter diagnosis)  Comment: RE ASSES   Plan: TSH (SENSITIVE), THYROXINE, FREE (FREE T4),         LIPID PANEL, HEPATIC FUNCTION PANEL, BASIC         METABOLIC PANEL           272.0 Pure hypercholesterolemia  Comment: RE ASSES   Plan: TSH (SENSITIVE), THYROXINE, FREE (FREE T4),         LIPID PANEL, HEPATIC FUNCTION PANEL, BASIC         METABOLIC  PANEL            415.19AC PE (pulmonary embolism)  Comment:ON ANTICOAGULATION   ACCORDING TO DR HARRISON COUMADIN FOR 6 MONTHS THEN DISCONTINUE should she have a second episode, she would be obligated to be on lifelong anticoagulation  Plan: TSH (SENSITIVE), THYROXINE, FREE (FREE T4),         LIPID PANEL, HEPATIC FUNCTION PANEL, BASIC         METABOLIC PANEL       I did review patient's past medical and family/social history, no changes noted.  Barriers to Learning assessed: none. Patient verbalizes understanding of teaching and instructions.  Zettie Cooley MD

## 2009-06-14 ENCOUNTER — Encounter

## 2009-06-17 ENCOUNTER — Encounter

## 2009-06-17 ENCOUNTER — Ambulatory Visit: Payer: Self-pay | Admitting: Pharmacotherapy

## 2009-06-25 ENCOUNTER — Encounter

## 2009-06-25 ENCOUNTER — Ambulatory Visit: Payer: Self-pay | Admitting: Pharmacist

## 2009-06-25 ENCOUNTER — Ambulatory Visit

## 2009-07-01 ENCOUNTER — Ambulatory Visit: Payer: Self-pay | Admitting: Pharmacotherapy

## 2009-07-01 ENCOUNTER — Encounter

## 2009-07-04 ENCOUNTER — Telehealth: Payer: Self-pay | Admitting: INTERNAL MEDICINE

## 2009-07-04 NOTE — Telephone Encounter (Signed)
Patient is calling a bit upset because she had some labs done 05/23/09 for Dr Romeo Apple and no one called her with results. She did call in 06/05/09 and was told that doctor is out and that her primary care physician could give her the results.  Well her primary care physician printed out the results and on them was this remark "We also recommend appropriate genetic counseling to explain   the implication of these results to the patient. "  She would like to talk with Dr Romeo Apple about what does this mean? SHe wants him to call her today. [phone]    Tammy [name],Mosc II  [facility]  [phone]

## 2009-07-04 NOTE — Telephone Encounter (Signed)
This encounter is still pending. Please close if done.

## 2009-07-04 NOTE — Telephone Encounter (Signed)
Patient would like to discuss lab results with Dr. Romeo Apple

## 2009-07-04 NOTE — Telephone Encounter (Signed)
Dr. Romeo Apple had called the patient regarding labs

## 2009-07-08 ENCOUNTER — Encounter

## 2009-07-08 ENCOUNTER — Ambulatory Visit: Payer: Self-pay | Admitting: Pharmacotherapy

## 2009-07-12 ENCOUNTER — Telehealth: Payer: Self-pay | Admitting: Internal Medicine

## 2009-07-12 NOTE — Telephone Encounter (Signed)
Patient calling for lab results.  Patient is off to work right now, so please call her Monday with results.

## 2009-07-15 ENCOUNTER — Ambulatory Visit: Payer: Self-pay

## 2009-07-15 ENCOUNTER — Encounter

## 2009-07-16 ENCOUNTER — Telehealth: Payer: Self-pay | Admitting: Internal Medicine

## 2009-07-16 NOTE — Telephone Encounter (Signed)
Called patient and phone rang and rang. No answering machine.  Dr. Parks Neptune wanted patient to know that she has sent a staff message to Dr. Tiburcio Pea the endocrinologist.  Patient should get in touch with Dr. Tiburcio Pea.  If she does not hear from him in a week call Dr, Parks Neptune back and she will try again with another message or phone call.  Ian Bushman MA II

## 2009-07-17 NOTE — Telephone Encounter (Signed)
called patient  no answer  H.MacKenzie SR. LVN

## 2009-07-18 ENCOUNTER — Telehealth: Payer: Self-pay

## 2009-07-18 DIAGNOSIS — I2699 Other pulmonary embolism without acute cor pulmonale: Secondary | ICD-10-CM

## 2009-07-18 NOTE — Telephone Encounter (Signed)
Spoke with patient. ID x 3.  Expained to patient that she is to contact Dr. Tiburcio Pea.  Patient states she is not having good results communicating with Dr. Tiburcio Pea.  Patient would like a new referral to a different endocrinologist closer to [facility].  Patient has Medicare/Medical.  Patient will try and schedule appointment with Dr. Parks Neptune after she sees Dr. Nedra Hai (Cardio).  Ian Bushman MA II

## 2009-07-18 NOTE — Telephone Encounter (Signed)
Patient states she is suppose to have labs done and states she was informed by lab that there are no orders in emr.    Please call patient when done please leave message if patient is unavailable.    Alejandro Mulling PCN  [phone] x [phone]

## 2009-07-19 NOTE — Progress Notes (Signed)
Date:  07 / 22 / 2010    Stress Echocardiography report:    Diagnosis: Chest pain, pulmonary embolism, paroxysmal atrial fibrillation                                      Measurements:    IVS (ED):             0.8 cm  LVPW (ED):        0.9 cm  LA:                       3.4 cm  Aorta:                   3.0 cm  Aortic cusp sep:  1.5 cm    RVID (ED):          1.0 cm  LVID (ED):           4.9 cm  LVID (ES):           2.9 cm  FS:                       40 %  LV EF:                  71 %    Baseline Echocardiography:    2D & M mode echocardiography:  Morphologically normal aortic, mitral and tricuspid valves;  Normal cardiac chamber size;  Normal left ventricular wall thickness;  Normal left ventricular wall motion;        Color flow mapping and doppler study:  Mild pulmonic regurgitation;  Mild tricuspid regurgitation;  RV systolic pressure was estimated to be 33 mmHg.        Treadmill exercise test:    Patient was exercised for 6.55 minutes on the Bruce exercise protocol, achieving a maxinum heart rate of 144 bpm, which was 94 % of the aged -predicted maxinum heart rate.  Test was terminated due to Patient fatigue.  Patient had No chest discomfort.        Baseline EKG:    Rhythm: Sinus rhythm ; Rate: 82 bpm; PR: 0.16 sec,; QRS: 0.08 sec.; QT : 0.40 sec.; Within normal limits   BP 100 / 70 mmHg;  There was one supraventricular premature beat  prior to exercise test on monitor    Exercise EKG: showed No ST segment  changes  Diagnostic  for  myocardial ischemia.   ;   BP rose to 130/ 60 mmHg;  There were One isolated ventricular premature beats During exercise ;      Stress Echocardiogram:  showed No ischemic segmental wall motion abnormality;    Diagnosis:    Exercise EKG showed  No  ischemic ST abnormality;  Stress Echocardiogram showed  No  ischemic segmental wall motion abnormality.  Patient had No chest pain with exercise.       Electronically signed by:    Delman Kitten MD, [facility], FACP  Attending Physician    PCN  [place]  [address]  [place], [place] [address]

## 2009-07-22 ENCOUNTER — Ambulatory Visit: Payer: Self-pay | Admitting: Pharmacotherapy

## 2009-07-22 ENCOUNTER — Ambulatory Visit

## 2009-07-22 ENCOUNTER — Encounter

## 2009-07-22 NOTE — Telephone Encounter (Signed)
Lab order mailed to patient's home.

## 2009-07-24 NOTE — Telephone Encounter (Signed)
I have already ordered this as a future order in the computer. Endocrinology referal started other than dr. Tiburcio Pea. [facility] will be able to see her fairly soon.  Please let patient know.

## 2009-07-25 NOTE — Telephone Encounter (Addendum)
Spoke with spouse, Mel.  ID x 3 .  Asked him to advise patient that another referral has been started to another endocrinologist.  Ian Bushman MA II

## 2009-07-29 ENCOUNTER — Encounter

## 2009-07-30 ENCOUNTER — Ambulatory Visit: Payer: Self-pay | Admitting: Pharmacotherapy

## 2009-08-01 ENCOUNTER — Ambulatory Visit: Admitting: Cardiovascular Disease

## 2009-08-01 NOTE — Nursing Note (Signed)
>>   Rose Fillers, LVN     Thu Aug 01, 2009  2:42 PM  Patient identified by name and date of birth, vital signs taken, allergies verified, screened for pain, medications verified. Patient has been asked if they are taking any OTC or herbal medications, vitamins, supplements or home remedies. Patient denies they have been hospitalized since last visit.                  Leanor Rubenstein,  Sr. LVN

## 2009-08-01 NOTE — Progress Notes (Signed)
HPI: See dictated portion of report.        Current outpatient prescriptions   Medication Sig    Calcium Carbonate-Vit D3-Min (CALTRATE-600 PLUS VITAMIN D3) 600-400 mg-unit PO Tablet Take 1 Tab by mouth. Twice a day    Levothyroxine (SYNTHROID) 112 mcg PO Tablet Take  by mouth. Every other day alternating with 125 mcg levothyroxine    Levothyroxine (SYNTHROID) 125 mcg PO Tablet Take  by mouth. Every other day alternating with 112 mcg of levothyroxine    MULTIVITS W-CA,FE,OTHER MIN (ONE-A-DAY WOMENS FORMULA PO) Take 1 Tab by mouth every day.    Simvastatin (ZOCOR) 40 mg PO Tablet Take 40 mg by mouth every day at bedtime.   (cholesterol)    Warfarin (COUMADIN) 1 mg Tablet Take 3-4 Tabs by mouth every day. As instructed by coumadin clinic    Warfarin (COUMADIN) 1 mg Tablet Take 3-4 Tabs by mouth every day. Or as directed           No Known Allergies    Past medical history:  ENT: occasionally tinnitus,   ENDOCRINE:   Hypothyroidism   PULMONARY:  pulmonary embolism  CARDIAC: Paroxysmal atrial fibrillation   DERMATOLOGY:  S[place] 5 years ago  HYPERLIPIDEMIA:       Past surgical history:  25 years ago; left leg vein stripping        Review of systems:  Constitutional: ; Weight has been steady;  CNS: no syncope episode(s), no lightheadedness / dizziness with orthostatic / positional Changes;  MENTAL: no depression, under stress;  Eyes: no amaurosis fugax, no diplopia: has eye glasses; no history of cataract surgery;  ENT: has tinnitus, no vertigo,   ENDOCRINE: no diabetes; hypo thyroid disorder;   PULMONARY: no hemoptysis, no orthopnea, no PND; has exertional dyspneic symptoms; no cough;   CARDIOVASCULAR: no chest pain / ache ; no palpitation / skipped beats / extrasystoles;  GI: no melena; no hematochezia; no abdominal pain;  GU: no dysuria, no hematuria, no nocturia;  Extremities/vascular: no left / right dependent pedal edema; no intermittent claudication; no stasis dermatitis; had left / right  lower extremity venous varicosity  MUSCULOSKELETAL: no arthritis, no myalgia;   DERMATOLOGIC: no rash, no itching;  HEMATOLOGIC: no history of anemia , no history of leukemia;        PHYSICAL EXAMINATION:  HEAD: normocephalic. no trauma  EYES: pupils were equal and reactive to light. Extraocular muscle intact. Conjunctiva pink. Non icteric sclera. no arcus senilis.  ENT: no ear canal drainage; moist oral mucosa; no hearing aid  NECK: no neck vein distension. Left and right carotid pulses were equal. no left / right carotid bruit.  LUNGS: Normal left / right lungs breath sounds. no left / right basilar rales. no left / right lung rhonchi. no wheezes.  CHEST: no chest wall pain.   CARDIAC: normal first heart sound. normal second heart sounds. regular rhythm. no extrasystoles. There was no murmur. No gallop. no lift or thrill.  ABDOMEN: No mass, no pain. No tenderness, no pulsatile mass. no abdominal bruit.  EXTREMITIES: no left / right pedal edema. no left / right pretibial edema. no stasis dermatitis changes . Left and right pedal pulses were 1-2+. There is left and right lower leg venous varicosity.  MUSCULOSKELETAL: mild kyphosis;  SKIN: no rash. no ecchymosis spots .  NEURO: Cranial nerves 2 - 12 intact. no focal extremity weakness.      DATA REVIEWED:  Component      Latest Ref Rng 06/17/2009  8:16 AM   SODIUM      135 - 145 mEq/L 139   POTASSIUM      3.3 - 5.0 mEq/L 4.0   CHLORIDE      95 - 110 mEq/L 104   CARBON DIOXIDE TOTAL      24 - 32 mEq/L 24   UREA NITROGEN, BLOOD (BUN)      8 - 22 mg/dL 16   CREATININE BLOOD      0.44 - 1.27 mg/dL 1.61   E-GFR, AFRICAN AMERICAN      >60 >60   E-GFR, NON-AFRICAN AMERICAN      >60 >60   GLUCOSE      70 - 110 mg/dL 92   CALCIUM      8.6 - 10.5 mg/dL 8.8   FASTING       YES   CHOLESTEROL      0 - 200 mg/dL 096 (H)   HDL CHOLESTEROL      >= 35 62   LDL CHOLESTEROL CALCULATION      < 130 131 (H)   TOTAL CHOLESTEROL:HDL RATIO      <  4.0 3.5   TRIGLYCERIDE      35 - 160 mg/dL 045   NON-HDL CHOLESTEROL      0 - 150 mg/dl 409 (H)   PROTEIN      6.3 - 8.3 g/dL 6.7   ALBUMIN      3.2 - 4.6 g/dL 3.7   ALKALINE PHOSPHATASE (ALP)      35 - 115 U/L 54   ASPARTATE TRANSAMINASE (AST)      15 - 43 U/L 27   BILIRUBIN TOTAL      0.3 - 1.3 mg/dL 0.5   ALANINE TRANSFERASE (ALT)      5 - 54 U/L 23   BILIRUBIN DIRECT      0.0 - 0.2 mg/dL < 0.1       Component      Latest Ref Rng 03/05/2009 06/17/2009           8:53 AM  8:16 AM   FASTING       YES YES   CHOLESTEROL      0 - 200 mg/dL 811 914 (H)   HDL CHOLESTEROL      >= 35 67 62   LDL CHOLESTEROL CALCULATION      < 130 98 131 (H)   TOTAL CHOLESTEROL:HDL RATIO      < 4.0 2.7 3.5   TRIGLYCERIDE      35 - 160 mg/dL 81 782   NON-HDL CHOLESTEROL      0 - 150 mg/dl 956 213 (H)         07 / 22 / 2010  Stress Echocardiography report:  Diagnosis: Chest pain, pulmonary embolism, paroxysmal atrial fibrillation     Measurements:  IVS (ED): 0.8 cm  LVPW (ED): 0.9 cm  LA: 3.4 cm  Aorta: 3.0 cm  Aortic cusp sep: 1.5 cm  RVID (ED): 1.0 cm  LVID (ED): 4.9 cm  LVID (ES): 2.9 cm  FS: 40 %  LV EF: 71 %  Baseline Echocardiography:  2D & M mode echocardiography:  Morphologically normal aortic, mitral and tricuspid valves;  Normal cardiac chamber size;  Normal left ventricular wall thickness;  Normal left ventricular wall motion;  Color flow mapping and doppler study:  Mild pulmonic regurgitation;  Mild tricuspid regurgitation;  RV systolic pressure was estimated to be 33 mmHg.  Treadmill exercise test:  Patient was exercised for 6.55 minutes on the Bruce exercise protocol, achieving a maxinum heart rate of 144 bpm, which was 94 % of the aged -predicted maxinum heart rate. Test was terminated due to Patient fatigue. Patient had No chest discomfort.   Baseline EKG:  Rhythm: Sinus rhythm ;  Rate: 82 bpm; PR: 0.16 sec,; QRS: 0.08 sec.; QT : 0.40 sec.; Within normal limits   BP 100 / 70 mmHg;  There was one supraventricular premature beat prior to exercise test on monitor  Exercise EKG: showed No ST segment changes Diagnostic for myocardial ischemia. ;   BP rose to 130/ 60 mmHg;  There were One isolated ventricular premature beats During exercise ;  Stress Echocardiogram: showed No ischemic segmental wall motion abnormality;  Diagnosis:  Exercise EKG showed No ischemic ST abnormality;  Stress Echocardiogram showed No ischemic segmental wall motion abnormality.  Patient had No chest pain with exercise.    Assessments and  Diagnostic / treatment plans:  See dictated portion of report.        Electronically signed by:    Delman Kitten MD, [facility], FACP  Attending Physician   [facility] Group[facility] PCN  [address]  [place], [place] [address]  Phone: [phone]

## 2009-08-01 NOTE — Progress Notes (Addendum)
PATIENT: Tammy [name], Tammy [name] LOCATIONValarie Merino   MR #: [id number] SEX: F AGE: 68  DATE OF SERVICE: 08/01/2009 DOB: 1941-04-15    [facility] NOTE    HPI:    A 68 year old white female with cardiac dysrhythmia and pulmonary embolism returned today for followup.    Paroxysmal atrial fibrillation: Patient denied any cardiac palpitations and had no paroxysmal tachycardia sensation. Patient has been on warfarin anticoagulation since episode of pulmonary embolism.    Chest pain: Patient had right anterior chest pain symptoms with her pulmonary embolism. Patient had stress echocardiogram, which showed no ischemic wall motion abnormalities. Exercise EKG showed no ischemic ST abnormalities.    Hyperlipidemia: Patient has hyperlipidemia and has been on simvastatin 40 mg p.o. daily for approximately 6 months. Last lipid profile June 17, 2009, showed cholesterol 214, HDL 62, LDL 131, triglycerides 104 with non-HDL cholesterol 152. This represents an increase of cholesterol, LDL, and non-HDL cholesterol compared to Mar 05, 2009. Patient said that she has been watching her diet.    Hypothyroidism: Patient has hypothyroidism and is on thyroid replacement therapy. Thyroid function evaluation June 17, 2009, showed normal TSH at 1.25 and free thyroxine 1.27.    ASSESSMENT AND PLAN:     1. Paroxysmal atrial fibrillation. No sensation of cardiac palpitations or paroxysmal tachycardia. In view of the patient's pulmonary embolism and paroxysmal atrial fibrillation, I recommend that patient be continued on chronic warfarin anticoagulation. 2. Patient has chest pain symptoms with no recurrence. Stress echocardiogram demonstrated no ischemic wall motion abnormalities. 3. Patient has hyperlipidemia. Lipid profile from May to August 2010 showed less favorable profile. Patient has been on simvastatin 40 mg p.o. daily. I request repeat assessment with fasting lipid panel to determine need for medication adjustment.  4. Hypothyroidism. Patient is  already on replacement therapy. 5. Tentative cardiac visit six months.    Patient instructed on all aspects of care, plan, and acknowledged. No handouts provided other than those noted above.  No barriers to learning other than those noted above. No pain issues identified other than those noted above.         THIS WAS ELECTRONICALLY SIGNED - 08/01/2009 10:14 PM PST BY: Delman Kitten, MD  ASSISTANT CLINICAL PROFESSOR  [facility]  NETWORK INTERNAL MEDICINE                [id number])    D: 08/01/2009 08:18 PM  T: 08/01/2009 10:06 PM  C#: [id number]

## 2009-08-05 ENCOUNTER — Encounter

## 2009-08-05 ENCOUNTER — Ambulatory Visit: Payer: Self-pay | Admitting: Pharmacotherapy

## 2009-08-06 ENCOUNTER — Telehealth: Payer: Self-pay | Admitting: Internal Medicine

## 2009-08-06 NOTE — Telephone Encounter (Signed)
1)  Patient would like results of her lab done on 08/05/09.  2)  Patient has decided to continue care with Dr. Tiburcio Pea, so does not need referral to see another specialist.

## 2009-08-08 NOTE — Telephone Encounter (Addendum)
Spoke with patient. Gave Dr. Mechele Claude result message.  Patient stated she saw Dr. Nedra Hai and he said her labs were not good.  Cannot understand his comment.  Sending copies of labs from 05/2009 and 07/2009 for comparison.  Tammy Bushman MA II

## 2009-08-08 NOTE — Telephone Encounter (Addendum)
PLEASE CALL HER

## 2009-08-08 NOTE — Telephone Encounter (Signed)
Please let her know her cholesterol and LDL are better than before can tell her the numbers

## 2009-08-08 NOTE — Telephone Encounter (Addendum)
Patient called for her results of labs. Gave the results per doctor. Please call patient back she was questioning the results.    Tammy [name]  [facility] II - Per Adelina Mings PCN

## 2009-08-12 ENCOUNTER — Telehealth: Payer: Self-pay | Admitting: INTERNAL MEDICINE

## 2009-08-12 ENCOUNTER — Encounter

## 2009-08-12 NOTE — Telephone Encounter (Signed)
Patient is calling and states Dr. Romeo Apple was going to order a special test for her to get done before her appointment 08/22/09. She states she has her lab appointment 08/19/09. She would like to check if the test is ordered and if there is additional test that she needs to do. She states she will be working today and it is OK to leave a message on voicemail. Please advise.

## 2009-08-12 NOTE — Telephone Encounter (Signed)
Patient notified Dr. Romeo Apple is out of office today will return Wednesday and the message will be forwarded then.

## 2009-08-13 ENCOUNTER — Ambulatory Visit: Payer: Self-pay | Admitting: Pharmacotherapy

## 2009-08-15 ENCOUNTER — Telehealth: Payer: Self-pay | Admitting: Internal Medicine

## 2009-08-15 NOTE — Telephone Encounter (Signed)
Dr Tiburcio Pea   Can you please talk to her   Zettie Cooley MD

## 2009-08-15 NOTE — Telephone Encounter (Signed)
Patient is currently on Coumadin (since June of this year), which is monitored by the [facility]. Patient is on coumadin because of a blood clot in her lung in June. Patient has been advised by Dr. Nedra Hai (Cardiology) to stay on the Coumadin for life.  Patient was then advised by Dr. Tiburcio Pea (Hematology) to discontinue the Coumadin for a week to take specialized blood tests.  Patient is confused & refuses to stop the Coumadin.  Patient gave the impression that she is not comfortable with Dr. Tiburcio Pea & his suggestions.  Patient would like some advise.    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    Ethelene Browns, [facility] Referrals Coordinator  [facility]  [phone] Ext [phone]

## 2009-08-15 NOTE — Telephone Encounter (Signed)
Forward message to PCP for review and recommendation.  Gaylon Melchor MA II

## 2009-08-16 NOTE — Telephone Encounter (Signed)
This encounter is still pending. Please close if done.

## 2009-08-19 ENCOUNTER — Encounter

## 2009-08-19 ENCOUNTER — Ambulatory Visit

## 2009-08-20 ENCOUNTER — Telehealth: Payer: Self-pay | Admitting: Internal Medicine

## 2009-08-20 ENCOUNTER — Ambulatory Visit: Payer: Self-pay | Admitting: Pharmacotherapy

## 2009-08-20 NOTE — Telephone Encounter (Addendum)
Referral processed requesting [facility] - Request Dr. Theda Belfast.  Per referral guidelines, Coumadin will contact the patient within 7-10 business working days to set up an appointment.    Ethelene Browns, [facility] Referrals Coordinator  [place] [facility]  [phone] Ext [phone]

## 2009-08-20 NOTE — Telephone Encounter (Signed)
Patient is having issues with her Coumadin levels.  Dr. Nedra Hai informed her to stay on Coumadin for life, while Dr. Romeo Apple informed her to discontinue the coumadin.  The [facility] advised the patient to get a referral from her PCP to see Dr. Theda Belfast at the [facility]madin Clinic for a consult.    Please order.    Ethelene Browns, [facility] III Referrals Coordinator   [facility]  [phone] Ext [phone]

## 2009-08-20 NOTE — Telephone Encounter (Signed)
ORDERD

## 2009-08-22 ENCOUNTER — Encounter: Payer: Self-pay | Admitting: INTERNAL MEDICINE

## 2009-08-22 ENCOUNTER — Ambulatory Visit

## 2009-08-26 ENCOUNTER — Ambulatory Visit: Payer: Self-pay | Admitting: Pharmacotherapy

## 2009-08-26 ENCOUNTER — Encounter

## 2009-09-02 ENCOUNTER — Ambulatory Visit: Payer: Self-pay | Admitting: Pharmacotherapy

## 2009-09-02 ENCOUNTER — Encounter

## 2009-09-09 ENCOUNTER — Ambulatory Visit: Payer: Self-pay | Admitting: Pharmacotherapy

## 2009-09-09 ENCOUNTER — Encounter

## 2009-09-16 ENCOUNTER — Ambulatory Visit: Payer: Self-pay | Admitting: Pharmacotherapy

## 2009-09-16 ENCOUNTER — Encounter

## 2009-09-17 ENCOUNTER — Ambulatory Visit

## 2009-09-17 NOTE — Nursing Note (Signed)
>>   Donah Driver, LVN     Tue Sep 17, 2009  1:50 PM  The patient completed the flu questionnaire, verified ID and signed the consent. The Inactivated Influenza Vaccine VIS document was given to patient to review and any questions they had were answered. The consent was then reviewed for any Yes answers. The Influenza Vaccine was then administered per protocol. The patient was observed for immediate reactions to the vaccine per protocol. None were observed.Clois Comber SR LVN

## 2009-09-23 ENCOUNTER — Encounter

## 2009-09-23 ENCOUNTER — Ambulatory Visit

## 2009-09-24 ENCOUNTER — Ambulatory Visit: Payer: Self-pay | Admitting: Pharmacotherapy

## 2009-09-30 ENCOUNTER — Ambulatory Visit: Payer: Self-pay

## 2009-09-30 ENCOUNTER — Ambulatory Visit

## 2009-09-30 ENCOUNTER — Encounter

## 2009-10-01 ENCOUNTER — Encounter: Payer: Self-pay | Admitting: General Practice

## 2009-10-01 ENCOUNTER — Ambulatory Visit: Admitting: General Practice

## 2009-10-01 VITALS — BP 121/67 | HR 66 | Temp 97.1°F | Ht 66.0 in | Wt 179.9 lb

## 2009-10-01 NOTE — Nursing Note (Signed)
>>   Tammy [name]     Tue Oct 01, 2009  7:51 AM  Vital signs taken, allergies verified, screened for pain.   The patient smokes: no .  DON0VAN HARRISON MA

## 2009-10-02 NOTE — Progress Notes (Addendum)
October 01, 2009 RELAWANDA, HOLZHEIMER    MR#: [id number]   DOB: October 24, 1940   Date of Service: 10/01/2009       Zettie Cooley, MD    Dear Drs. Parks Neptune and Packard:    I had the pleasure of seeing Tammy [name] in the [facility]. As you are aware, she is a lovely 68 year old Caucasian female who was well until some time in 2009 when she was diagnosed with being hypothyroid. She was started on Synthroid and had her dose adjusted several times. I note in the chart that in December of 2009 her TSH was up to 7.49 and as she was stabilized on thyroid replacement therapy, her TSH fell in May to a value of 0.20 with a thyroxin T4 level of 1.52. It was shortly after this in June that she began developing some labored breathing, some shaking and chest pain. It was only after this that she got on a plane to fly to [place]. In [facility] her labored breathing became even more severe, and she went to see a physician in the Emergency Room at [facility] in [place]. There they noted rapid atrial fibrillation and hospitalized her. She spontaneously came out into sinus rhythm. Work-up revealed that she had a modest size medial basal segment pulmonary embolism on a CT angio. Ultrasound of the legs was negative. D-dimer is elevated at 2.32 with the upper limit of normal being 0.4. She was anticoagulated with Lovenox, but developed a diffuse rash after three days, nevertheless was transitioned to warfarin, and she has been followed ever since doing extremely well, keeping her INR always between 2 and 3, measuring her INR weekly. Patient now is asking whether she can come off of the anticoagulation therapy.    The rest of her past medical history is notable for some varicose veins. She had a vein stripping a number of years ago on the left side. Her problem lists her hypothyroidism. She does have hyperlipidemia, but notably does not have well-defined diabetes, hypertension,  heart failure or prior history of stroke.    Patient is nervous about taking the Coumadin. She has talked to both of you in the past and is unclear whether she should stay on warfarin the rest of her life for atrial fibrillation. A note by Dr. Nedra Hai in October suggested that she has a diagnosis of paroxysmal atrial fibrillation and recommended that she stay on warfarin for life. By my reading of the chart, she has only had this one episode of atrial fibrillation and it was within a three-week time period of when she had a suppressed TSH suggesting she may have been transiently hyperthyroid. Moreover, there is some literature to suggest that hyperthyroidism may lead to an increased risk of thrombosis. This one scenario is that she became transiently hyperthyroid, hypercoagulable and went into atrial fibrillation and, for whatever reason, developed a small pulmonary embolism. She is now euthyroid and is doing well without any clinical symptomatology to suggest paroxysmal atrial fibrillation, and therefore it is possible that she does not carry the diagnosis of chronic paroxysmal atrial fibrillation requiring anticoagulation. Moreover, her CHADS score is 0 since she is not over age 45 and does not have hypertension, diabetes, heart failure or prior stroke.    Patient otherwise has been doing well. No shortness of breath, no hemoptysis, no unknown bleeding. Social history; patient works in [place] at an [employer]. She is very active, although she does not get a lot  of aerobic exercise. She does not smoke, drink or use illicit drugs. Family history is negative for thrombosis. Past surgical history is remarkable for the left superficial vein stripping on the left side.    Review of systems negative for headache, visual changes, no hearing loss. History of hypothyroidism and no history of pneumonia or any major lung disease. No history of heart attack. She is hyperlipidemic. No GI symptoms. No nausea, vomiting,  diarrhea. No history of liver disease. No history of any inflammatory arthritis. No major skin disease.    On physical exam today her blood pressure was 121/67, temperature 36, pulse 66 and regular. BMI was 29, O2 sat. 99%. Head, eyes, ears, nose and throat were all within normal limits. Lungs were clear. Heart was normal and no murmur, no gallop. Abdomen was slightly obese, soft, no masses. When felt, no organomegaly is noted. Extremities showed some mild venous varicosities on the legs without any postphlebitic changes. No thickening of the skin, no hyperpigmentation, no edema, no ulceration.    Review of her labs shows INR's in the therapeutic range. We see the suppressed TSH on May 18 last year shortly before she developed these symptoms. She has had no D-dimer measurement since she's been on warfarin.    Impression: Patient with an idiopathic pulmonary embolism not brought on by travel, that preceded a plane flight to [facility]. The most likely scenario is she was transiently hyperthyroid and either developed atrial fibrillation and subsequent pulmonary embolism or developed a small pulmonary embolism and in conjunction with the atrial fibrillation became symptomatic. From the standpoint of risk factors for recurrence, she is over age 92 which is a risk factor. She has no postphlebitic changes and never had any ultrasound evidence of DVT in her leg. She does not meet criteria for obesity and thus one other risk factor we always look for is elevation of her D-dimer while on warfarin. This has not been measured. Thus the plan will be to discuss with both of you the proposed therapy that her paroxysmal atrial fibrillation and PE may have coincidentally developed in a background of being transiently hyperthyroid and therefore with resolution of this, she is at lower risk for recurrence.    Before I make a final decision, I'd like you concurrence and I'd like to do a V/Q scan to see if there are any  residual perfusion defects in the lung. This will also provide [place] with a baseline test for PE in case she develops any acute symptomatology suggesting PE. If, on the other hand, you have a convincing argument that favors her having paroxysmal atrial fibrillation, it would be question as to whether or not she does need aspirin treatment with a CHADS score of 0, or whether you want to continue her on warfarin. Only after seeing the D-dimer, I'll make a final assessment.    If teh d-dimer while on warfarin therapy is normal and if her new lung scan shows complete resolution, I think I'd favor discontinuation of anticoagulation in this lady. However, if she does have a persistently elevated D-dimer, I would be more prone to keep her on anticoagulation therapy. In the event we do stop her warfarin, we will get D-dimer measurements one to two months afterward as she has been shown to be associated with a lower risk of recurrence. Overall, women with idiopathic venous thromboembolism have a 50% lower incidence of recurrence compared to men, so her overall risk of five years is about 15%. If she  does not meet the criteria for being at high risk for recurrent VTE, her overall recurrence rate is much lower, only 3%, which is equivalent with what we get with warfarin. Therefore, I will continue and finish the work-up and be in contact with you via the EMR, will get consensus as to whether or not we should stop her warfarin therapy or not after these tests come back.    Thank you very much for asking me to see Miss Hecht.      Tammy Bells, MD  PROFESSOR  DIVISION OF GENERAL MEDICINE  DEPARTMENT OF INTERNAL MEDICINE  THIS WAS ELECTRONICALLY SIGNED - 10/02/2009 4:59 PM PST BY:                   ZOX:[id number])    D: 10/01/2009 08:52 AM  T: 10/01/2009 03:51 PM  C#: [id number]    cc:   Delman Kitten, MD

## 2009-10-04 LAB — ANTI-PHOSPHOLIPID AB PANEL
ANTI-CARDIOLIPIN Ab SCREEN: NEGATIVE PL
BETA-2-GLYCOPROTEIN,IGG: 2.6 G UNITS (ref 0–20)
BETA-2-GLYCOPROTEIN,IGM: 3.1 M UNITS (ref 0–20)
DRVVT CONFIRMATION TEST: 42.3 s
DRVVT RATIO: 1.02 RATIO (ref 0.8–1.2)
VIPER VENOM TIME: 43.2 s — ABNORMAL HIGH (ref 23.4–36.1)

## 2009-10-07 ENCOUNTER — Ambulatory Visit: Payer: Self-pay | Admitting: Pharmacist

## 2009-10-07 ENCOUNTER — Encounter

## 2009-10-10 ENCOUNTER — Other Ambulatory Visit: Payer: Self-pay | Admitting: Pharmacist

## 2009-10-10 MED ORDER — WARFARIN 1 MG TABLET
3.0000 mg | ORAL_TABLET | Freq: Every day | ORAL | Status: DC
Start: 2009-10-10 — End: 2010-04-22

## 2009-10-14 ENCOUNTER — Other Ambulatory Visit: Payer: Self-pay | Admitting: Internal Medicine

## 2009-10-14 MED ORDER — SIMVASTATIN 40 MG TABLET
40.0000 mg | ORAL_TABLET | Freq: Every day | ORAL | Status: DC
Start: 2009-10-14 — End: 2010-10-29

## 2009-10-14 NOTE — Telephone Encounter (Signed)
This is a refill authorization for the faxed request for Rx#  [phone] (Simvastatin 40 mg).  Last date filled 08/26/09.  Michaell Cowing, MA

## 2009-10-15 ENCOUNTER — Encounter

## 2009-10-22 ENCOUNTER — Encounter

## 2009-10-22 ENCOUNTER — Ambulatory Visit

## 2009-10-22 ENCOUNTER — Ambulatory Visit: Payer: Self-pay | Admitting: Pharmacotherapy

## 2009-10-29 ENCOUNTER — Ambulatory Visit

## 2009-10-29 ENCOUNTER — Inpatient Hospital Stay: Admit: 2009-10-29 | Discharge: 2009-10-29

## 2009-11-05 ENCOUNTER — Encounter: Admission: RE | Admit: 2009-11-05 | Discharge: 2009-11-05 | Payer: Self-pay | Admitting: Gastroenterology

## 2009-11-12 ENCOUNTER — Ambulatory Visit: Payer: Self-pay | Admitting: Pharmacist

## 2009-11-12 ENCOUNTER — Encounter

## 2009-11-12 LAB — INR: INR: 2.61 — ABNORMAL HIGH (ref 0.87–1.18)

## 2009-11-19 ENCOUNTER — Ambulatory Visit

## 2009-11-25 ENCOUNTER — Encounter

## 2009-11-25 ENCOUNTER — Ambulatory Visit: Payer: Self-pay | Admitting: Pharmacotherapy

## 2009-11-26 ENCOUNTER — Ambulatory Visit: Admitting: General Practice

## 2009-11-26 ENCOUNTER — Ambulatory Visit

## 2009-11-26 ENCOUNTER — Encounter

## 2009-11-26 VITALS — BP 135/69 | HR 62 | Temp 98.1°F | Ht 66.0 in | Wt 176.4 lb

## 2009-11-26 MED ORDER — WARFARIN 1 MG TABLET
3.0000 | ORAL_TABLET | Freq: Every day | ORAL | Status: DC
Start: 2009-11-26 — End: 2010-04-22

## 2009-11-26 NOTE — Nursing Note (Signed)
>>   Tammy [name]     Tue Nov 26, 2009  8:46 AM  Vital signs taken, allergies verified, screened for pain.   The patient smokes: no .  DON0VAN HARRISON MA

## 2009-11-26 NOTE — Progress Notes (Signed)
Tammy [name]  is a  69yr  old   female   CC: I am losing my hair    Had PE after a plane flight     Now with normal d-dimer on warfarin  V/Q now NORMAL  Would be candidate for stopping warfarin but carries Dx of paroxsymal AF and this is another indication for warfarin    Will communicate this issue to her primary care MD and cardiologist  ROS:   Constitutional: negative.  Eyes: negative.  Ears, Nose, Mouth, Throat: negative.  CV: negative.  Resp: negative.  GI: negative.  GU: negative.  Musculoskeletal: negative.  Integumentary: negative.  Neuro: negative.  Psych: Mood pt's report, euthymic.  Endo: negative.  Heme/Lymphatic: negative.    No outpatient prescriptions have been marked as taking for the 11/26/09 encounter (Office Visit) with Worth Kober, [facility].         PMH:  Past medical history was reviewed and updated on problem list.   Patient Active Problem List   Diagnoses Code    UNSPECIFIED ACQUIRED HYPOTHYROIDISM 244.9    ROUTINE MEDICAL EXAM V70.0    OTHER MALAISE AND FATIGUE 780.79    left supraclavicular fullness 786.6    LOSS OF WEIGHT 783.21    PURE HYPERCHOLESTEROLEMIA 272.0    PE (pulmonary embolism) 415.19AC    Atrial fibrillation 427.31    Encounter for long-term (current) use of anticoagulants V58.61    PE (physical exam) V70.9X       PE: BP 135/69  Pulse 62  Temp(Src) 36.7 C (98.1 F) (Oral)  Ht 1.676 m (5\' 6" )  Wt 80.015 kg (176 lb 6.4 oz)  BMI 28.47 kg/m2  SpO2 99%    General Appearance: healthy, alert, no distress, pleasant affect, cooperative.  Eyes:  conjunctivae and corneas clear. PERRL, EOM's intact. sclerae normal.  Ears:  normal TMs and canal and external inspection of ears show no abnormality.  Mouth: normal.  Neck:  Neck supple. No adenopathy, thyroid symmetric, normal size.  Heart:  normal rate and regular rhythm, no murmurs, clicks, or gallops.  Lungs: clear to auscultation.  Abdomen: BS normal.  Abdomen soft, non-tender.  No masses or organomegaly.  Extremities:  no  cyanosis, clubbing, or edema.  Skin:  Skin color, texture, turgor normal. No rashes or lesions.      Lab Results   Lab Name Value Date/Time    WBC 8.3 12/10/2008  8:32 AM    HGB 14.3 12/10/2008  8:32 AM    HCT 40.9 12/10/2008  8:32 AM    PLT 334 12/10/2008  8:32 AM       Lab Results   Lab Name Value Date/Time    NA 139 06/17/2009  8:16 AM    K 4.0 06/17/2009  8:16 AM    CL 104 06/17/2009  8:16 AM    CO2 24 06/17/2009  8:16 AM    BUN 16 06/17/2009  8:16 AM    CR 0.83 06/17/2009  8:16 AM    GLU 92 06/17/2009  8:16 AM       Lab Results   Lab Name Value Date/Time    AST 27 06/17/2009  8:16 AM    ALT 23 06/17/2009  8:16 AM    ALP 54 06/17/2009  8:16 AM    ALB 3.7 06/17/2009  8:16 AM    TP 6.7 06/17/2009  8:16 AM         Assessment/Plan    415.19AC PE (pulmonary embolism)  (primary encounter diagnosis)  Comment: This occurred after a relatively short plane flight  If we consider this unprovoked then the following are the known risk factors for recurrent VTE in women with unprovoked VTE:    A) BMI > 30  B) age > 80 is   C) post phlebitic changes in leg  D) elevated d-dimer during warfarin    CMAJ  June 14, 2007; 179 (5). doi:10.1503/cmaj.080493.   2008 [place] [employer] or its licensors    She has only age as risk factor  V/Q repeat was entirely normal  I lean on stopping warfarin, but if she has AF or paroxsymal AF she may be candidate (albeit her CHADS2 score is 0)    Plan: Warfarin (COUMADIN) 1 mg Tablet            244.9 UNSPECIFIED ACQUIRED HYPOTHYROIDISM  Comment: appears to have stable response now but will recheck TSH   Plan: TSH WITH FREE T4 REFLEX            EDUCATION:  I have educated/instructed the patient or caregiver regarding all aspects of the above stated plan of care.  The patient or caregiver indicated understanding.  [place] interpreter was not used.

## 2009-12-10 ENCOUNTER — Encounter

## 2009-12-10 ENCOUNTER — Telehealth: Payer: Self-pay | Admitting: Cardiovascular Disease

## 2009-12-10 ENCOUNTER — Ambulatory Visit: Payer: Self-pay | Admitting: Pharmacotherapy

## 2009-12-10 NOTE — Telephone Encounter (Signed)
Message to Dr. Henneke.Ciara Kagan,LVN

## 2009-12-10 NOTE — Telephone Encounter (Signed)
Please let her know her tsh blood test is still in process will await results   She can make follow up to discuss medications

## 2009-12-10 NOTE — Telephone Encounter (Signed)
Tammy [name] saw Dr. Cliffton Asters at [facility] on 11/26/2009.  She said that Dr. Cliffton Asters was going to contact Dr. Nedra Hai about moving her appointment up and having a heart monitor.  It has been two weeks and she hasn't heard anything.      Please call the patient to advise [phone] (home).    Tammy [name], [facility]

## 2009-12-10 NOTE — Telephone Encounter (Signed)
Pt walked in after completing her labs this morning @ [address] location.   Pt would like Dr. Parks Neptune to pls contact her ASAP to discuss the meds she is taking for her Thyroid & the blood thinners.  Pt states she is taking approx 40 pills per wk with readings ranging from 112-125.    She is also being treated by Dr. Cliffton Asters who mentioned to her that he feels she is maybe taking too many of the Thyroid medication.  Pt would like to discuss this.

## 2009-12-11 NOTE — Telephone Encounter (Addendum)
Spoke to husband   Asked that patient call back for appt  Clois Comber SR LVN

## 2009-12-11 NOTE — Telephone Encounter (Addendum)
Telephone call to spouse, Mel. patient identified X2. States patient at work, not returning home until 6:30PM. Will have patient return call in am.  Told to have patient call PCP for appointment and that we have not heard from Dr. Cliffton Asters regarding making earlier appointment with Dr. Nedra Hai.     Spouse gave verbal understanding.

## 2009-12-24 ENCOUNTER — Encounter

## 2009-12-24 ENCOUNTER — Ambulatory Visit

## 2009-12-24 ENCOUNTER — Ambulatory Visit: Payer: Self-pay

## 2010-01-07 ENCOUNTER — Ambulatory Visit

## 2010-01-07 ENCOUNTER — Encounter

## 2010-01-07 ENCOUNTER — Ambulatory Visit: Payer: Self-pay | Admitting: Pharmacotherapy

## 2010-01-16 ENCOUNTER — Observation Stay (HOSPITAL_COMMUNITY): Admission: AD | Admit: 2010-01-16 | Discharge: 2010-01-18 | Payer: Self-pay | Admitting: Orthopedic Surgery

## 2010-01-21 ENCOUNTER — Encounter

## 2010-01-21 ENCOUNTER — Ambulatory Visit

## 2010-01-21 ENCOUNTER — Ambulatory Visit: Payer: Self-pay | Admitting: Pharmacist

## 2010-01-28 ENCOUNTER — Ambulatory Visit: Admitting: Cardiovascular Disease

## 2010-01-28 NOTE — Nursing Note (Signed)
>>   Rose Fillers, LVN     Tue Jan 28, 2010  8:50 AM  Patient identified by name and date of birth, vital signs taken, allergies verified, screened for pain, medications verified. Patient has been asked if they are taking any OTC or herbal medications, vitamins, supplements or home remedies. Patient denies they have been hospitalized since last visit.  Patient is here for follow up visit.                 Leanor Rubenstein,  Sr. LVN

## 2010-01-28 NOTE — Progress Notes (Signed)
HPI:    Cardiac arrhythmias: 69 year old white female with paroxysmal atrial fibrillation returned today for followup. The patient denied any cardiac palpitations and has no syncope or lightheadedness symptoms. Patient is on warfarin anti-coagulation.    Patient saw hematologist  for assessment of need for anticoagulations. Patient has history of pulmonary embolism. Hematologist recommended that she discontinue warfarin. She seeked second opinion with Dr. Cliffton Asters. Dr. Cliffton Asters recommended that she continue on warfarin.    Hyperlipidemia: Patient is on simvastatin 40 mg p.o. Daily. Patient had no myalgias or myositis symptoms.Last lipid profile was about more than 6 months ago. Profile was satisfactory.    Pulmonary embolism: Patient has no shortness of breath, orthopnea or PND. She had no chest pain. Patient continued on warfarin anti- coagulation.    Hypothyroidism: Patient is on Synthroid 112 mcg alternating with 125 mcg p.o. Daily. Thyroid functions had been satisfactory. She Denies cardiac palpitation.    Current outpatient prescriptions   Medication Sig    Calcium Carbonate-Vit D3-Min (CALTRATE-600 PLUS VITAMIN D3) 600-400 mg-unit PO Tablet Take 1 Tab by mouth. Twice a day    Levothyroxine (SYNTHROID) 112 mcg PO Tablet Take  by mouth. Every other day alternating with 125 mcg levothyroxine    Levothyroxine (SYNTHROID) 125 mcg PO Tablet Take  by mouth. Every other day alternating with 112 mcg of levothyroxine    MULTIVITS W-CA,FE,OTHER MIN (ONE-A-DAY WOMENS FORMULA PO) Take 1 Tab by mouth every day.    Simvastatin (ZOCOR) 40 mg Tablet Take 1 Tab by mouth every day at bedtime.   (cholesterol)    Warfarin (COUMADIN) 1 mg Tablet Take 3-4 Tabs by mouth every day. Or as directed    Warfarin (COUMADIN) 1 mg Tablet Take 3-4 Tabs by mouth every day. As instructed by coumadin clinic         Allergies   Allergen Reactions    Enoxaparin Rash     When she had PE, diffuse rash, biopsied         Past medical history:  ENT:  occasionally tinnitus,   ENDOCRINE: Hypothyroidism   PULMONARY: pulmonary embolism  CARDIAC: Paroxysmal atrial fibrillation   DERMATOLOGY: [place] 5 years ago  HYPERLIPIDEMIA:   Past surgical history:  25 years ago; left leg vein stripping  Review of systems:  Constitutional: ; Weight has been steady;  CNS: no syncope episode(s), no lightheadedness / dizziness with orthostatic / positional Changes;  MENTAL: no depression, under stress;  Eyes: no amaurosis fugax, no diplopia: has eye glasses; no history of cataract surgery;  ENT: has tinnitus, no vertigo,   ENDOCRINE: no diabetes; hypo thyroid disorder;   PULMONARY: no hemoptysis, no orthopnea, no PND; has occasional exertional dyspneic symptoms; no cough;   CARDIOVASCULAR: no chest pain / ache ; no palpitation / skipped beats / extrasystoles;  GI: no melena; no hematochezia; no abdominal pain;  GU: no dysuria, no hematuria, no nocturia;  Extremities/vascular: no left / right dependent pedal edema; no intermittent claudication; no stasis dermatitis; had left / right lower extremity venous varicosity  MUSCULOSKELETAL: no arthritis, no myalgia;   DERMATOLOGIC: no rash, no itching;  HEMATOLOGIC: no history of anemia , no history of leukemia;  PHYSICAL EXAMINATION:  HEAD: normocephalic. no trauma  EYES: pupils were equal and reactive to light. Extraocular muscle intact. Conjunctiva pink. Non icteric sclera. no arcus senilis.  ENT: no ear canal drainage; moist oral mucosa; no hearing aid  NECK: no neck vein distension. Left and right carotid pulses were equal. no left / right  carotid bruit.  LUNGS: Normal left / right lungs breath sounds. no left / right basilar rales. no left / right lung rhonchi. no wheezes.  CHEST: no chest wall pain.   CARDIAC: normal first heart sound. normal second heart sounds. regular rhythm. no extrasystoles. There was no murmur. No gallop. no lift or thrill.  ABDOMEN: No mass, no pain. No  tenderness, no pulsatile mass. no abdominal bruit.  EXTREMITIES: no left / right pedal edema. no left / right pretibial edema. no stasis dermatitis changes . Left and right pedal pulses were 1-2+. There is left and right lower leg venous varicosity.  MUSCULOSKELETAL: mild kyphosis;  SKIN: no rash. no ecchymosis spots .  NEURO: Cranial nerves 2 - 12 intact. no focal extremity weakness.  DATA REVIEWED:  Component      Latest Ref Rng 08/05/2009           7:27 AM   FASTING       YES   CHOLESTEROL      0 - 200 mg/dL 161   HDL CHOLESTEROL      >= 35 62   LDL CHOLESTEROL CALCULATION      < 130 104   TOTAL CHOLESTEROL:HDL RATIO      < 4.0 3.0   TRIGLYCERIDE      35 - 160 mg/dL 99   NON-HDL CHOLESTEROL      0 - 150 mg/dl 096     Component      Latest Ref Rng 06/17/2009 06/17/2009           8:16 AM  8:16 AM   THYROID STIMULATING HORMONE      0.35 - 4.00 mcIU/mL  1.25   THYROXINE, FREE (FREE T4)      0.76 - 1.80 ng/dL 0.45      Assessments and  Diagnostic / treatment plans:  See dictated portion of report.  #1. Cardiac arrhythmia: Patient's has no recurrence of symptoms of paroxysmal atrial fibrillation. Patient maintained regular rhythm on examination today. I recommend that she continues on warfarin anticoagulation.  #2 hyperlipidemia: Lipid profile has been satisfactory. Patient had no myalgias or myositis symptoms. She is to continue on simvastatin therapy. I request followup comprehensive metabolic panel and fasting lipid panel.  #3 hypothyroidism: Patient has satisfactory thyroid function. No change is recommended in replacement dosage.  #4 pulmonary embolism: Patient has been asymptomatic with no shortness of breath or chest pain symptoms. Continue medical surveillance.  #5 cardiac visit one year and p.r.n.  Electronically signed by:    Delman Kitten MD, [facility], FACP  Attending Physician  [facility]  [address]  [place], [place] [address]  Phone: [phone]

## 2010-02-04 ENCOUNTER — Ambulatory Visit: Payer: Self-pay | Admitting: Pharmacotherapy

## 2010-02-04 ENCOUNTER — Telehealth: Payer: Self-pay | Admitting: Internal Medicine

## 2010-02-04 ENCOUNTER — Encounter

## 2010-02-04 NOTE — Telephone Encounter (Signed)
Did talk to her tsh low she will cut back sythyroid to 112 daily   Repeat tsh in 8 weeks

## 2010-02-04 NOTE — Telephone Encounter (Signed)
Patient had labs done this morning. Patient would like a phone call back with lab results as soon as they come in. Please advise

## 2010-02-14 ENCOUNTER — Other Ambulatory Visit: Payer: Self-pay | Admitting: Internal Medicine

## 2010-02-14 MED ORDER — LEVOTHYROXINE 112 MCG TABLET
ORAL_TABLET | ORAL | Status: DC
Start: 2010-02-14 — End: 2010-02-25

## 2010-02-14 NOTE — Telephone Encounter (Signed)
This is a  REFILL AUTHORIZATION for the faxed request for prescription number [phone].  Last date filled 01/10/2010

## 2010-02-25 ENCOUNTER — Ambulatory Visit: Payer: Self-pay

## 2010-02-25 ENCOUNTER — Telehealth: Payer: Self-pay | Admitting: Internal Medicine

## 2010-02-25 ENCOUNTER — Encounter

## 2010-02-25 ENCOUNTER — Ambulatory Visit

## 2010-02-25 MED ORDER — LEVOTHYROXINE 100 MCG TABLET
100.0000 ug | ORAL_TABLET | Freq: Every day | ORAL | Status: DC
Start: 2010-02-25 — End: 2011-02-03

## 2010-02-25 NOTE — Telephone Encounter (Signed)
Discussed with her tsh low she did have some palpations 2 days ago will cut back on synthroid to 100 mcg  She did understand she went early for her TSH will repeat in 8 week

## 2010-03-11 ENCOUNTER — Encounter

## 2010-03-11 ENCOUNTER — Ambulatory Visit: Payer: Self-pay | Admitting: Pharmacist

## 2010-03-25 ENCOUNTER — Ambulatory Visit

## 2010-03-25 ENCOUNTER — Encounter

## 2010-03-25 ENCOUNTER — Ambulatory Visit: Payer: Self-pay

## 2010-04-06 ENCOUNTER — Other Ambulatory Visit: Payer: Self-pay | Admitting: Cardiovascular Disease

## 2010-04-06 NOTE — Progress Notes (Signed)
Tell patient:  Need thyroid bld test  I have entered order in EMR  electronically signed by:    Delman Kitten MD, [facility], FACP  Attending Physician (Cardiology)  [facility]  [address]  [place], [place] [address]  Phone: [phone]

## 2010-04-07 ENCOUNTER — Encounter: Admission: RE | Admit: 2010-04-07 | Discharge: 2010-04-07 | Payer: Self-pay | Admitting: Internal Medicine

## 2010-04-07 NOTE — Progress Notes (Addendum)
Called and left message for patient to call us back.  

## 2010-04-08 ENCOUNTER — Ambulatory Visit: Payer: Self-pay | Admitting: Pharmacist

## 2010-04-08 ENCOUNTER — Encounter

## 2010-04-08 NOTE — Progress Notes (Addendum)
Patient called back and [id number]. Gave message regarding lab order request. Patient will get it done.

## 2010-04-22 ENCOUNTER — Encounter

## 2010-04-22 ENCOUNTER — Ambulatory Visit: Payer: Self-pay

## 2010-04-22 ENCOUNTER — Telehealth: Payer: Self-pay | Admitting: Cardiovascular Disease

## 2010-04-22 ENCOUNTER — Ambulatory Visit

## 2010-04-22 MED ORDER — WARFARIN 1 MG TABLET
3.0000 mg | ORAL_TABLET | Freq: Every day | ORAL | Status: DC
Start: 2010-04-22 — End: 2010-09-01

## 2010-04-22 NOTE — Telephone Encounter (Signed)
Gage called and wanted Dr. Sherron Ales to know that she did the labs that Dr. Sherron Ales ordered this am and would like a call back when the results come in.

## 2010-04-22 NOTE — Telephone Encounter (Signed)
Waiting for lab results to inform Dr. Nedra Hai.  Leanor Rubenstein SR LVN

## 2010-04-23 NOTE — Telephone Encounter (Signed)
See EMR for lab results from 04/22/10.  Leanor Rubenstein SR LVN

## 2010-04-23 NOTE — Telephone Encounter (Addendum)
Telephone call to patient, patient identified X2.  Given Dr. Marigene Ehlers message re: lab results.  Patient gave verbal understanding.

## 2010-04-23 NOTE — Telephone Encounter (Signed)
Thyroid function was satisfactory  Protime is therapeutic, warfarin is regulated through anti-coagulation clinic

## 2010-04-29 ENCOUNTER — Telehealth: Payer: Self-pay | Admitting: Internal Medicine

## 2010-04-29 NOTE — Telephone Encounter (Signed)
Please let her her know thyroid results within normal limits   Continue current therapy

## 2010-04-29 NOTE — Telephone Encounter (Addendum)
Spoke with spouse, Mel.  Patient asked that we give results to him.  Gave him Dr. Mechele Claude result message.  Ian Bushman MA II

## 2010-04-29 NOTE — Telephone Encounter (Signed)
Patient called and sounded annoyed that no one has returned her call regarding her lab results.  Informed patient that she called Dr. Marigene Ehlers office for lab results and received her results from his office.  Patient stated that she lab work done for Dr. Nedra Hai and Dr. Parks Neptune and no one has contacted her from Dr. Mechele Claude office.  Patient is not happy and wants doctor to call her with lab results.  Please call and advise.

## 2010-05-06 ENCOUNTER — Encounter

## 2010-05-14 ENCOUNTER — Other Ambulatory Visit: Payer: Self-pay | Admitting: Pharmacist

## 2010-05-20 ENCOUNTER — Encounter

## 2010-05-20 ENCOUNTER — Ambulatory Visit

## 2010-05-20 ENCOUNTER — Ambulatory Visit: Payer: Self-pay | Admitting: Pharmacotherapy

## 2010-06-03 ENCOUNTER — Encounter

## 2010-06-17 ENCOUNTER — Encounter

## 2010-06-24 ENCOUNTER — Ambulatory Visit

## 2010-06-24 ENCOUNTER — Encounter

## 2010-06-24 ENCOUNTER — Ambulatory Visit: Payer: Self-pay | Admitting: Pharmacotherapy

## 2010-07-22 ENCOUNTER — Encounter

## 2010-07-22 ENCOUNTER — Ambulatory Visit

## 2010-07-22 ENCOUNTER — Ambulatory Visit: Payer: Self-pay

## 2010-08-26 ENCOUNTER — Ambulatory Visit

## 2010-08-26 ENCOUNTER — Encounter

## 2010-08-26 ENCOUNTER — Ambulatory Visit: Payer: Self-pay | Admitting: Pharmacotherapy

## 2010-09-01 ENCOUNTER — Other Ambulatory Visit: Payer: Self-pay | Admitting: Pharmacotherapy

## 2010-09-01 MED ORDER — WARFARIN 1 MG TABLET
3.0000 mg | ORAL_TABLET | Freq: Every day | ORAL | Status: DC
Start: 2010-09-01 — End: 2011-03-31

## 2010-09-23 ENCOUNTER — Ambulatory Visit: Payer: Self-pay | Admitting: Pharmacist

## 2010-09-23 ENCOUNTER — Ambulatory Visit

## 2010-09-23 ENCOUNTER — Encounter

## 2010-10-21 ENCOUNTER — Telehealth: Payer: Self-pay | Admitting: Internal Medicine

## 2010-10-21 NOTE — Telephone Encounter (Addendum)
Patient informed that labs have been ordered  [employer] SR LVN

## 2010-10-21 NOTE — Telephone Encounter (Signed)
Labs Ordered

## 2010-10-21 NOTE — Telephone Encounter (Signed)
Patient is scheduled for an upcoming INR and would like orders added for a Thyroid test as well as a Lipid panel as she states its been awhile since have those additional items completed.

## 2010-10-28 ENCOUNTER — Telehealth: Payer: Self-pay | Admitting: Pharmacist

## 2010-10-28 ENCOUNTER — Encounter

## 2010-10-28 ENCOUNTER — Ambulatory Visit

## 2010-10-28 NOTE — Telephone Encounter (Signed)
Please call pt with this mornings lab results.      Marden Noble, [facility]  Anticoagulation  Clinic  [facility]  [phone]

## 2010-10-29 ENCOUNTER — Telehealth: Payer: Self-pay | Admitting: Internal Medicine

## 2010-10-29 ENCOUNTER — Other Ambulatory Visit: Payer: Self-pay | Admitting: Internal Medicine

## 2010-10-29 ENCOUNTER — Ambulatory Visit: Payer: Self-pay | Admitting: Pharmacotherapy

## 2010-10-29 MED ORDER — SIMVASTATIN 40 MG TABLET
40.0000 mg | ORAL_TABLET | Freq: Every day | ORAL | Status: DC
Start: 2010-10-29 — End: 2011-04-02

## 2010-10-29 NOTE — Telephone Encounter (Signed)
Patient called to request lab results (cholesterol and thyroid).  Please call patient and advise.

## 2010-10-29 NOTE — Telephone Encounter (Signed)
Patient called to request refill.

## 2010-10-29 NOTE — Telephone Encounter (Signed)
Pt has been called by Barbara Cower.jb

## 2010-10-30 NOTE — Telephone Encounter (Signed)
Please let her know her cholesterol and thyroid are within normal limits.

## 2010-10-31 NOTE — Telephone Encounter (Addendum)
Spoke with patient. ID x 3.  Gave Dr. Hundal's result message.  Sigismund Cross MA II

## 2010-12-02 ENCOUNTER — Ambulatory Visit

## 2010-12-02 ENCOUNTER — Encounter

## 2010-12-03 ENCOUNTER — Ambulatory Visit: Payer: Self-pay

## 2010-12-30 ENCOUNTER — Ambulatory Visit

## 2010-12-30 ENCOUNTER — Ambulatory Visit: Payer: Self-pay | Admitting: Pharmacotherapy

## 2011-01-12 LAB — COMPREHENSIVE METABOLIC PANEL
ALT: 41 U/L — ABNORMAL HIGH (ref 0–35)
BUN: 17 mg/dL (ref 6–23)
Calcium: 9.3 mg/dL (ref 8.4–10.5)
GFR calc Af Amer: >60 mL/min (ref >60)
Glucose, Bld: 117 mg/dL — ABNORMAL HIGH (ref 70–99)
Sodium: 141 mEq/L (ref 135–145)
Total Bilirubin: 0.4 mg/dL (ref 0.3–1.2)

## 2011-01-12 LAB — URINALYSIS, ROUTINE W REFLEX MICROSCOPIC
Glucose, UA: NEGATIVE mg/dL
Hgb urine dipstick: NEGATIVE
Ketones, ur: NEGATIVE mg/dL
Protein, ur: NEGATIVE mg/dL
Urobilinogen, UA: 1 mg/dL (ref 0.0–1.0)

## 2011-01-12 LAB — CBC
HCT: 38.7 % (ref 36.0–46.0)
MCV: 92.9 fL (ref 78.0–100.0)
Platelets: 263 10*3/uL (ref 150–400)
RBC: 4.16 MIL/uL (ref 3.87–5.11)
RDW: 14 % (ref 11.5–15.5)
WBC: 5.4 10*3/uL (ref 4.0–10.5)

## 2011-01-12 LAB — DIFFERENTIAL
Basophils Absolute: 0 10*3/uL (ref 0.0–0.1)
Eosinophils Absolute: 0.1 10*3/uL (ref 0.0–0.7)
Neutro Abs: 3.2 10*3/uL (ref 1.7–7.7)
Neutrophils Relative %: 60 % (ref 43–77)

## 2011-01-12 LAB — APTT: aPTT: 29 seconds (ref 24–37)

## 2011-01-13 ENCOUNTER — Ambulatory Visit

## 2011-01-14 ENCOUNTER — Ambulatory Visit: Payer: Self-pay

## 2011-01-27 ENCOUNTER — Ambulatory Visit

## 2011-01-27 ENCOUNTER — Ambulatory Visit: Payer: Self-pay | Admitting: Pharmacotherapy

## 2011-02-03 ENCOUNTER — Other Ambulatory Visit: Payer: Self-pay | Admitting: Internal Medicine

## 2011-02-03 MED ORDER — LEVOTHYROXINE 100 MCG TABLET
100.0000 ug | ORAL_TABLET | Freq: Every day | ORAL | Status: DC
Start: 2011-02-03 — End: 2011-04-02

## 2011-02-03 NOTE — Telephone Encounter (Signed)
She needs a follow-up

## 2011-02-03 NOTE — Telephone Encounter (Signed)
Patient called to request refill.

## 2011-02-04 NOTE — Telephone Encounter (Signed)
Pllease schedule patient for appt  Clois Comber SR LVN

## 2011-02-05 NOTE — Telephone Encounter (Signed)
LM with spouse to have patient please return call.  Patient needs to schedule follow-up appointment with Dr. Parks Neptune.

## 2011-02-10 ENCOUNTER — Ambulatory Visit

## 2011-02-10 ENCOUNTER — Ambulatory Visit: Payer: Self-pay

## 2011-02-10 LAB — INR: INR: 2.09 — ABNORMAL HIGH (ref 0.87–1.18)

## 2011-02-24 ENCOUNTER — Ambulatory Visit

## 2011-02-24 ENCOUNTER — Ambulatory Visit: Payer: Self-pay | Admitting: Pharmacotherapy

## 2011-02-25 ENCOUNTER — Telehealth: Payer: Self-pay | Admitting: Cardiovascular Disease

## 2011-02-25 NOTE — Telephone Encounter (Signed)
routed to Dr. Rodak for advice and recommendations.     Tammy [name] LVN

## 2011-02-25 NOTE — Telephone Encounter (Signed)
Okay to take the flight

## 2011-02-25 NOTE — Telephone Encounter (Signed)
Tammy [name] is on coumadin.  She is planning to take an Product manager to [facility] (1 hour and 20 minutes).  She contacted the Anticoagulation [facility] who instructed her to contact Dr. Nedra Hai.  [name] would like to know if it would be okay for her to take this flight.    Please call the patient to advise [phone] (home).    Blondell Reveal, [facility]

## 2011-02-25 NOTE — Telephone Encounter (Signed)
Telephone call to patient, patient identified X3  Told OK to take flight per Dr. Nedra Hai.

## 2011-03-05 ENCOUNTER — Other Ambulatory Visit: Payer: Self-pay | Admitting: Internal Medicine

## 2011-03-05 DIAGNOSIS — Z1231 Encounter for screening mammogram for malignant neoplasm of breast: Secondary | ICD-10-CM

## 2011-03-06 NOTE — Op Note (Signed)
[name], [name]                   ACCOUNT NO.:  [id number]   MEDICAL RECORD NO.:  [id number]          PATIENT TYPE:  AMB   LOCATION:  DSC                          FACILITY:  [facility]   PHYSICIAN:  Tinnie Gens A. Petrinitz, D.P.M.DATE OF BIRTH:  [birth year]   DATE OF PROCEDURE:  09/24/2005  DATE OF DISCHARGE:                                 OPERATIVE REPORT   SURGEON:  Tinnie Gens A. Petrinitz, D.P.M.   PREOPERATIVE DIAGNOSIS:  Mallet toe deformity, second left, with abnormal  skin lesion.   PROCEDURE PERFORMED:  Mallet toe repair, second left, with surgical excision  of abnormal skin lesion.   ANESTHESIA:  Total IV with sedation.   TOURNIQUET:  Hemostasis by ankle tourniquet.   BLOOD LOSS:  Minimal blood loss.   DESCRIPTION OF PROCEDURE:  The patient was brought to the OR and placed on  the table in a supine position.  An ankle tourniquet was placed on superior  malleoli, left foot, with sufficient padding to prevent contusion.  Upon  receiving sedation, local anesthesia administered.  Left foot prepped and  draped usual aseptic manner, left foot examined, Esmarch bandage, ankle  tourniquet inflated to 250 mmHg.  The following procedure was performed:  Surgical excision of lesion and mallet toe repair, second left.  Attention  was directed to dorsum, second digit, left.  Skin lesion is just proximal to  the eponychium and more distal than the typical mallet toe deformity skin  incision planning.  Nonetheless, 2 transverse semi-elliptical skin incisions  are planned with the marking pen overlying the lesion; these are executed  with a 15 blade and the lesion is excised in total and submitted for  pathology exam.  Further transverse incision is performed overlying the  joint capsule and extensor tendon overlying the head of the middle phalanx.  The collateral ligaments are excised and further sharp tissue dissection is  performed surrounding the entire circumference of head of the middle  phalanx.  The skin edges are retracted and the head of the middle phalanx is  resected with the sagittal saw in transverse fashion proximal to the flare.  There is slight prominence of the dorsum base of the distal phalanx, soft  tissue reflected with the Freer periosteal elevator and osseous prominence  resected with the saggital saw.  All rough edges, both distal and proximal,  are smoothed with the reciprocating rasp.  The surgical site is flushed with  copious amounts of antibiotic solution and found to be free of bony  spicules, inspected with the [facility] mobile x-ray unit, good reduction of  deformity appreciated.  Further antibiotic flush is performed and the skin  edges are reapproximated with simple interrupted and horizontal mattress  suture consisting of 5-0 nylon.  The digit is injected with 0.5 mL of  dexamethasone phosphate, 4 or 5 mg/mL.  The skin is prepped with Betadine  followed by Xeroform and a dry sterile dressing; this is further reinforced  with Coban compression and Ace bandage.  Ankle tourniquet is deflated and  capillary refilling time is noted to return instantaneously to skin  of all digits.  The patient tolerated procedure and anesthesia well,  discharged to recovery room, all vital signs stable.  I dispensed very  detailed written and oral postop instructions, prescription and return to  office in 4-7 days.  I advised the patient and spouse to contact myself or  emergency pager service if questions or problems arise.           ______________________________  Eugenio Hoes. Petrinitz, D.P.M.     EAV/WUJW  D:  09/24/2005  T:  09/24/2005  Job:  [id number]

## 2011-03-06 NOTE — Op Note (Signed)
Suzanne Galloway, Suzanne Galloway                   ACCOUNT NO.:  [id number]   MEDICAL RECORD NO.:  [id number]          PATIENT TYPE:  AMB   LOCATION:  DSC                          FACILITY:  [facility]   PHYSICIAN:  Dionne Ano. Gramig III, M.D.DATE OF BIRTH:  [birth year]   DATE OF PROCEDURE:  11/25/2006  DATE OF DISCHARGE:                               OPERATIVE REPORT   PREOPERATIVE DIAGNOSES:  1. Left carpal tunnel syndrome.  2. 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   POSTOPERATIVE DIAGNOSES:  1. Left carpal tunnel release.  2. Left CMC arthroplasty (removal of the trapezium at the basilar      thumb joint level).  3. Digastric abductor pollicis longus tendon transfer to the first      metacarpal, FCR back upon itself (Zancolli tendon transfer).  4. Abductor pollicis longus one-third proper portion tendon transfer      to the FCR APL proper and back upon itself (Weilby tendon transfer      left basilar thumb joint).  5. Abductor pollicis longus tenodesis (shortening of her wrist      extensor at the wrist level to prevent dorsolateral escape).  6. First dorsal compartment release left wrist, with EPB tendon      release extensive.   SURGEON:  Dionne Ano. Amanda Pea, M.D.   ASSISTANT:  Karie Chimera, P.A.-C.   COMPLICATIONS:  None.   ANESTHESIA:  Regional with IV sedation.   TOURNIQUET TIME:  Just over one hour.   ESTIMATED BLOOD LOSS:  Minimal.   INDICATIONS FOR PROCEDURE:  The patient is a 70 year old female who  presents with the above-mentioned diagnoses.  I have counseled her in  regards to the risks and benefits of surgery,  including risk of  infection, bleeding, anesthesia, damage to normal structures and failure  of surgery to accomplish its intended goals of relieving symptoms and  restoring function.  With this in mind, she  desires to proceed.  All  questions had been encouraged and answered preoperatively.   The  patient had significant preop MCP hyperextension, collapse  deformity, and multiple loose bodies.  I have consented her for possible  volar plate advancement about the MCP joint and dermodesis, as well as  MCP pinning if necessary.   OPERATION IN DETAIL:  The patient was seen by myself and anesthesia; a  regional anesthetic was instituted and working quite well.  She was  sedated in the operative suite.  Preoperative antibiotics were given.  Permit was signed and arm was marked.  Following this, the patient  underwent a thorough prep and drape about the left upper extremity, and  once this was complete she then underwent an incision 1.5-2 cm in nature  at the distal edge of the transverse carpal ligament.  Dissection was  carried down through the palmar fascia, which was incised.  Retractor  was placed. The distal edge of the transverse carpal ligament was  released, fat pad egressed, and distal-to-proximal dissection was  carried out intact.  Her arm was available  for a canal preparatory  device, which was placed without difficulty.  Following this, I slid the  blunt tip scissors under direct vision, releasing the proximal leaflet.  She tolerated this well and there were no complicating features.  Once  this was complete, we then performed irrigation and closure of the  wound, after hemostasis was secured.   Following this, the patient underwent incision about the dorsal radial  aspect of the thumb.  Dissection was carried down the EPB and APL  tendons were identified, and an incision was made between them. The  capsule was then incised about the [facility] joint.  The trapezium was then  excised piecemeal and multiple loose bodies were removed.  The FCR  tendon was tenolysed and underwent freeing.  It was kept intact.  Following this, I created a drill hole dorsal to palmar, exiting intra-  articularly in line with the palmar beak ligament.  The patient  tolerated this well.  The area was  then copiously lavaged, and this  completed the [facility] arthroplasty portion of the procedure.   Once this was complete, I then performed an EPB tendon sheath incision  and release, and an extensive release of the first dorsal compartment  off of the volar ledge -- so as not to have any mechanical irritation  about the tendon apparatus.  I then harvested the APL digastric portion  and the APL proper portion, 1/3 in nature of the APL tendon through a  small counterincision in the dorsal distal third of the forearm.  These  tendons were retrieved in the main wound.  The other area was then  sutured with nylon after copious irrigation.   Following retrieving the tendons and first dorsal compartment release,  I then set the thumb for tendon transfer.   The patient's first tendon transfer was an APL proper.  I checked the  APL digastric tendon transfer to the first metacarpal, through the drill  hole dorsal to palmar, around the FCR tendon twice, and back up through  the APL proper and digastric portion.  The thumb was held in proper  position and this was inset with 4-0 FiberWire.  The patient tolerated  this well without difficulty.  It was tightened down nicely without  complicating feature.  Once this was complete, I then sutured additional  throws and checked the tension -- which looked excellent.  This  completed the Zancolli tendon transfer.   Following this, APL one-third proper portion tendon transfer to the FCR,  and back upon the APL proper with multiple figure-of-eight throws was  performed.  This completed the second tendon transfer of the thumb.  This was inset nicely with 4-0 FiberWire and tension was snugly fit.  The patient looked quite well after this, and this of course completed  the [facility] tendon transfer.   Following this, I then performed tightening of the APL proper tendon. This was a tenodesis at the wrist forearm level, with FiberWire to  prevent dorsolateral escape  to the thumb.  This was set to my  satisfaction without complicating feature, and following this I then  performed a capsular closure with FiberWire.  This completed the APL  tenodesis and complex capsular closure.   I did place Gelfoam in the defect, after the tendon transfer was  complete.  Following this, I deflated the tourniquet; irrigated it  copiously with saline, and closed the wound with Prolene.  She had  excellent refill, soft compartments and no complicating features.  All  sponge, needle and instrument counts were reported as correct.  She will  be  monitored overnight and be given IV antibiotics and pain medicine  appropriate to her needs.  I have discussed all issues with the family,  and at time of the first postoperative visit will proceed according to  the [facility] arthroplasty protocol.  All questions have been  encouraged and answered.           ______________________________  Dionne Ano. Everlene Other, M.D.     Nash Mantis  D:  11/25/2006  T:  11/25/2006  Job:  [id number]

## 2011-03-06 NOTE — Op Note (Signed)
NAMESHAYNAH, HUND                   ACCOUNT NO.:  [id number]   MEDICAL RECORD NO.:  [id number]          PATIENT TYPE:  AMB   LOCATION:  DSC                          FACILITY:  [facility]   PHYSICIAN:  Dionne Ano. Gramig III, M.D.DATE OF BIRTH:  1941/04/14   DATE OF PROCEDURE:  01/29/2005  DATE OF DISCHARGE:                                 OPERATIVE REPORT   PREOPERATIVE DIAGNOSIS:  Right carpal tunnel syndrome.   POSTOPERATIVE DIAGNOSIS:  Right carpal tunnel syndrome.   PROCEDURES:  1.  Right median nerve/peripheral nerve block __________ fascia, repair of      carpal tunnel.  2.  Right median nerve carpal tunnel release.   SURGEON:  Dionne Ano. Amanda Pea, M.D.   ASSISTANT:  Karie Chimera, P.A.-C.   COMPLICATIONS:  None.   ANESTHESIA:  Peripheral nerve block with light IV sedation, keeping the  patient awake, alert and oriented the entire case.   ESTIMATED BLOOD LOSS:  Minimal.   INDICATION FOR PROCEDURE:  The patient is a 70 year old female who presents  with above-mentioned diagnosis.  I have counseled her in regard to the risks  and benefits of surgery and she decides to proceed with the above-mentioned  operative intervention.  All questions have been encouraged and answered.   OPERATIVE PROCEDURE:  The patient seen by myself and anesthesia.  She was  taken to the operative suite.  She was given amoxicillin as well as Ancef  preoperatively.  She was prophylaxed for mitral valve prolapse.  The patient  was taken to the operating suite, underwent a light IV sedation with  fentanyl and Versed and following this underwent a median nerve/peripheral  nerve block at the wrist __________ fascia repair of carpal tunnel, and she  tolerated this well.  There were no complicating features.  Once the nerve  block was set up, the patient had thorough prep and drape with Betadine  scrub and paint.  The sterile field was secured.  The arm was elevated.  The  tourniquet was insufflated to 250  mmHg and the incision was made 1 cm in  nature at the distal edge of the transverse carpal ligament.  The incision  was carried down through the skin with knife blade.  Palmar fascia was then  incised, retractor deepened.  The distal edge of the transverse carpal  ligament was identified and released under 4.0 loupe magnification.  The fat  pad egressed nicely.  Following this, distal to proximal dissection was  carried out until adequate room was available for canal preparatory device  1, 2, and 3, which were placed just under the proximal lead leaf of the  transverse carpal ligament.  Following this, a security clip was placed,  obturator disengaged, and the security knife was placed and the security  clip effectively releasing the proximal leaflet of the transverse carpal  ligament.  The patient tolerated this well.  She was awake, alert and  oriented, had no pain during this.  The patient then had the canal  inspected, irrigation was applied to the wound, hemostasis was obtained, and  the  patient then  underwent closure of the wound after copious irrigation.  She tolerated this  well.  A sterile bandage was applied.  She was taken to the recovery room.  She will be discharged home on amoxicillin 500 mg one p.o. q.8h. x3 doses,  as well as Vicodin, Robaxin, and will return to see [place] in a week.      WMG/MEDQ  D:  01/29/2005  T:  01/29/2005  Job:  [id number]

## 2011-03-31 ENCOUNTER — Ambulatory Visit

## 2011-03-31 ENCOUNTER — Ambulatory Visit: Payer: Self-pay

## 2011-03-31 MED ORDER — WARFARIN 1 MG TABLET
3.0000 mg | ORAL_TABLET | Freq: Every day | ORAL | Status: DC
Start: 2011-03-31 — End: 2011-04-29

## 2011-03-31 NOTE — Progress Notes (Signed)
Addended by: Hollace Hayward on: 03/31/2011 03:21 PM     Modules accepted: Orders

## 2011-04-02 ENCOUNTER — Other Ambulatory Visit: Payer: Self-pay | Admitting: Internal Medicine

## 2011-04-02 NOTE — Telephone Encounter (Signed)
This is a  REFILL AUTHORIZATION for the faxed request for prescription number [id number].  Last date filled not listed    This is a  REFILL AUTHORIZATION for the faxed request for prescription number [id number].  Last date filled not listed.

## 2011-04-03 MED ORDER — SIMVASTATIN 40 MG TABLET
40.0000 mg | ORAL_TABLET | Freq: Every day | ORAL | Status: DC
Start: 2011-04-02 — End: 2012-04-30

## 2011-04-03 MED ORDER — LEVOTHYROXINE 100 MCG TABLET
100.0000 ug | ORAL_TABLET | Freq: Every day | ORAL | Status: DC
Start: 2011-04-02 — End: 2011-09-24

## 2011-04-07 ENCOUNTER — Ambulatory Visit

## 2011-04-08 ENCOUNTER — Ambulatory Visit: Payer: Self-pay

## 2011-04-09 ENCOUNTER — Ambulatory Visit
Admission: RE | Admit: 2011-04-09 | Discharge: 2011-04-09 | Disposition: A | Discharge status: Home / Self Care | Payer: BLUE CROSS/BLUE SHIELD | Source: Ambulatory Visit | Attending: Internal Medicine | Admitting: Internal Medicine

## 2011-04-09 DIAGNOSIS — Z1231 Encounter for screening mammogram for malignant neoplasm of breast: Secondary | ICD-10-CM

## 2011-04-20 ENCOUNTER — Ambulatory Visit

## 2011-04-28 ENCOUNTER — Ambulatory Visit

## 2011-04-28 ENCOUNTER — Ambulatory Visit: Payer: Self-pay

## 2011-04-29 ENCOUNTER — Other Ambulatory Visit: Payer: Self-pay

## 2011-04-29 ENCOUNTER — Ambulatory Visit

## 2011-04-29 MED ORDER — WARFARIN 1 MG TABLET
3.0000 mg | ORAL_TABLET | Freq: Every day | ORAL | Status: DC
Start: 2011-04-29 — End: 2011-08-04

## 2011-05-26 ENCOUNTER — Ambulatory Visit: Payer: Self-pay

## 2011-05-26 ENCOUNTER — Ambulatory Visit

## 2011-05-26 ENCOUNTER — Other Ambulatory Visit: Payer: Self-pay | Admitting: General Practice

## 2011-05-27 ENCOUNTER — Ambulatory Visit

## 2011-06-30 ENCOUNTER — Ambulatory Visit

## 2011-06-30 ENCOUNTER — Other Ambulatory Visit: Payer: Self-pay | Admitting: General Practice

## 2011-07-01 ENCOUNTER — Ambulatory Visit

## 2011-07-01 ENCOUNTER — Ambulatory Visit: Payer: Self-pay | Admitting: Pharmacist

## 2011-07-07 ENCOUNTER — Ambulatory Visit: Admitting: Cardiovascular Disease

## 2011-07-07 ENCOUNTER — Encounter: Payer: Self-pay | Admitting: Cardiovascular Disease

## 2011-07-07 NOTE — Nursing Note (Signed)
>>   Tammy [name]     Tue Jul 07, 2011 12:44 PM  Patient identified by name and date of birth, vital signs taken, allergies verified, screened for pain, medications verified. Patient has been asked if they are taking any OTC or herbal medications, vitamins, supplements or home remedies. Also asked if they have been hospitalized since last visit, patient denies being hospitalized since last visit. Patient here today for annual follow up . Tammy Lydon MA II

## 2011-07-07 NOTE — Progress Notes (Signed)
ECG was done in clinic today and shows HR 68, normal sinus rhythm with normal axis and PR, QRS, and QTc intervals.

## 2011-07-07 NOTE — Progress Notes (Signed)
67yr female    H/o: dyslipidemia, pulmonary embolus (spring 2010, tx'ed in [place] area hospital), paroxysmal atrial fibrillation    Here for:1.  F/u paroxysmal a fib       2. H/o pulmonary embolus    3. Dyslipidemia       4. overweight    Last visit: with Dr. Nedra Hai 01/2010    70yo woman with the above names medical problems, plus h/o hypothyroidism, who presents for routine f/u. She initially began seeing Dr Nedra Hai in fall of 2010 after having had a pulmonary embolus, unprovoked, and having been admitted to [facility] in [place] area for it. She reports she had had a sharp right sided chest pain a few days prior, but didn't know what it was. She went ahead and took a 1.5 hr airplane trip to [place], but felt bad while on the plane. When she got there daughter took her to the ER and she got checked and found to have a PE. She then was also found to be in atrial fibrillation. THis apparently resolved without cardioversion, after she was given IV Cardizem.  She has not had known recurrences of either a PE or atrial fibrillation since that time.    She also has a personal h/o dyslipidemia with elevated LDL (mild on the labs I have in our system, but unclear if she was on statin during some of all of these labs). She is currently on simvastatin 40mg /day and her most recent LDL is 100. Her HDL level is also normal.    She denies any recent palpitations, nor recurrence of the sharp chest pain she had with her PE. She does have occasional LE edema that is mild and seems to happen after standing all day. She is not excessively short of breath during her day to day activities, which includes working at an [employer].    The social history and past medical hx were reviewed and confirmed and addended as needed below in the relevant sections:    ROS:  Cardiac: has not had palpitations or any known recurrence of her a fib; no chest pain/anginal symptoms  Pulmonary: no SOB; no h/o COPD or asthma  Endocrine: there is a h/o   Hypothyroidism, but no DM or other endocrine problems.  Hem/onc: no known h/o blood clotting disorders or anemia or cancers    Past Medical History   Diagnosis Date    Herpes zoster without mention of complication 2004   Pulmonary embolus 2010 tx'ed at [facility] in [place] area  Atrial fibrillation, paroxysmal, episode @same  hospital stay as her PE was treated  Dyslipidemia/elevated LDL      Past Surgical History   Procedure Date    No surgical history     Colonoscopy      declined in past       Social History    Marital Status: MARRIED     Spouse Name: N/A     Number of Children: 2    Years of Education: N/A     Occupational History    used to work with Physicist, medical. Currently works in Advertising copywriter in [place]      Social History Main Topics    Smoking status: Never Smoker     Smokeless tobacco: Never Used    Alcohol Use: No    Drug Use: No    Sexually Active: Not on file   She is a never smoker. No 2nd hand smoker @her  home. 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      Family History   Problem Relation Age of Onset    Cancer Sister Died from oral cancer 2 years after stopped smoking and drinking     oral    Heart dz/possible CAD Sister 23s - she also smoke and drank heavily    CAD, MI Mother    Mother died age 69 and was a heavy smoker, had a large MI.    Current Outpatient Prescriptions   Medication Sig Dispense Refill    Calcium Carbonate-Vit D3-Min (CALTRATE-600 PLUS VITAMIN D3) 600-400 mg-unit PO Tablet Take 1 Tab by mouth. Twice a day  90 Tab  11    Levothyroxine (SYNTHROID) 100 mcg Tablet Take 1 tablet by mouth every day. (thyroid)  90 tablet  3    MULTIVITS W-CA,FE,OTHER MIN (ONE-A-DAY WOMENS FORMULA PO) Take 1 Tab by mouth every day.        Simvastatin (ZOCOR) 40 mg Tablet Take 1 tablet by mouth every day at bedtime.   (cholesterol)  90 tablet  3    Warfarin (COUMADIN) 1 mg Tablet Take 3 tablets by mouth every day. Or as directed by coumadin clinic.  270 tablet  2       O:    Filed Vitals:    07/07/11 1243   BP: 120/68   Pulse: 66   Resp: 14   Height: 1.676 m (5\' 6" )   Weight: 78.472 kg (173 lb)   SpO2: 97%   General: Patient awake, ambulatory, NAD  HEENT: NCAT. Anicteric sclerae. EOM grossly intact.  Neck: no JVD  Cardiac: heart regular rhythm, normal rate       Heart sounds: normal S1 and s2       Murmurs: none  Pulmonary: Lungs CTA bilaterally  Abdomen: +abdominal overweight; soft, NTND, +normoactive bowel sounds  Extremities: there is just a trace of pedal edema, nonpitting, tiny trace, and some varicose veins of legs that appear mild. Extremities warm, well perfused  Vascular: 2+ radial and DP pulses bilaterally       Lab Results   Lab Name Value Date/Time    LDLC 103 10/28/2010  7:00 AM    LDLC 102 02/04/2010  7:23 AM    HDL 60 10/28/2010  7:00 AM    HDL 68 02/04/2010  7:23 AM    TRIG 65 10/28/2010  7:00 AM    TRIG 82 02/04/2010  7:23 AM    CHOL 176 10/28/2010  7:00 AM    CHOL 186 02/04/2010  7:23 AM    AST 24 10/28/2010  7:00 AM    ALT 17 10/28/2010  7:00 AM    NA 140 10/28/2010  7:00 AM    K 4.0 10/28/2010  7:00 AM    BUN 14 10/28/2010  7:00 AM    CR 0.68 10/28/2010  7:00 AM    CR 0.59 02/04/2010  7:23 AM       A/P:  1) history of pulmonary embolus  -This was unprovoked,and happened in 2010 in [place].  She was started on warfarin @that  time, without any recurrences of DVT or PE since that time.    2) paroxysmal atrial fibrillation  Has not been seen since the time of her pulmonary embolus in 2010. This may not recur or it may come back in the future.  The risks/benefits of the anticoagulation with warfarin were d/w the patient, and she wants to continue with warfarin at this time. She does not have  a h/o excessive bleeding, and her INR's have been well regulated.  We did discuss using Pradaxa but given it is a new drug, and may have some as yet undiscovered risks, and may cost more, we elected to continue warfarin at this time.  -not on rate controlling or antiarrhythmic meds. I don't  think she needs them at this point, but if the AF comes back then she would need rate controlling and/or antiarrhythmic meds    3) dyslipidemia and Fhx of CAD in mother and sister (as above)  -She had a h/o elevated LDL for which Dr Parks Neptune her PCP started her on simvastatin.  I agree w/simvastatin at the current dose of 40mg /day. Her LDL is at goal (technically <130 but I agree with keeping her around 100-110 or less, as we are doing).  She needs to watch her diet also - she could lose a few pounds. Luckily her HDL is normal despite having some abdominal overweight. She is generally active at her job working on her feet in [employer], though not exercising that much.    4. Overweight - though generally active/on her feet at her job,she is not doing a lot of other exercise.  20-96min/day of aerobic exercise is recommended. If she did lose some of the weight, it might decrease her dose of statin that is necessary.

## 2011-07-27 ENCOUNTER — Ambulatory Visit

## 2011-07-28 ENCOUNTER — Other Ambulatory Visit: Payer: Self-pay | Admitting: General Practice

## 2011-07-28 ENCOUNTER — Telehealth: Payer: Self-pay | Admitting: Internal Medicine

## 2011-07-28 ENCOUNTER — Ambulatory Visit

## 2011-07-28 ENCOUNTER — Ambulatory Visit: Payer: Self-pay | Admitting: Pharmacist

## 2011-07-28 NOTE — Telephone Encounter (Signed)
Labs ordered, she needs a followup appointment with me

## 2011-07-28 NOTE — Telephone Encounter (Signed)
Spoke with patient. Id x 3.  Gave Dr. Mechele Claude advise message.  Patient states she will have to call back and set up appointment later.  Ian Bushman MA II

## 2011-07-28 NOTE — Nursing Note (Signed)
>>   Donah Driver, LVN     Tue Jul 28, 2011  9:16 AM  The patient completed the flu questionnaire, verified ID and signed the consent. The Inactivated Influenza Vaccine VIS document was given to patient to review and any questions they had were answered. The consent was then reviewed for any Yes answers. The Influenza Vaccine was then administered per protocol. The patient was observed for immediate reactions to the vaccine per protocol. None were observed.Clois Comber SR LVN

## 2011-07-28 NOTE — Telephone Encounter (Signed)
Patient in today for her flu shot.  She is requesting to have her thyroid levels checked, as her last lab for that was in Jan 2012.  Please place orders so she can schedule an appt.  Thanks

## 2011-08-04 ENCOUNTER — Other Ambulatory Visit: Payer: Self-pay | Admitting: Pharmacist

## 2011-08-04 MED ORDER — WARFARIN 1 MG TABLET
3.0000 mg | ORAL_TABLET | Freq: Every day | ORAL | Status: DC
Start: 2011-08-04 — End: 2012-03-09

## 2011-08-24 ENCOUNTER — Ambulatory Visit

## 2011-08-28 ENCOUNTER — Other Ambulatory Visit: Payer: Self-pay | Admitting: Pharmacist

## 2011-09-01 ENCOUNTER — Ambulatory Visit: Payer: Self-pay | Admitting: Pharmacist

## 2011-09-01 ENCOUNTER — Ambulatory Visit

## 2011-09-23 ENCOUNTER — Ambulatory Visit

## 2011-09-23 ENCOUNTER — Ambulatory Visit: Payer: Self-pay

## 2011-09-24 ENCOUNTER — Telehealth: Payer: Self-pay | Admitting: Internal Medicine

## 2011-09-24 MED ORDER — LEVOTHYROXINE 112 MCG TABLET
112.0000 ug | ORAL_TABLET | Freq: Every day | ORAL | Status: DC
Start: 2011-09-24 — End: 2012-03-09

## 2011-09-24 NOTE — Telephone Encounter (Signed)
Please let her know her TSH  Is  elevated, I want her to increase her Synthroid to 112 mcg daily, new prescription sent to the pharmacy repeat TSH in 8 weeks.    I have not seen her since 2010 and she needs to make a follow up appointment with me

## 2011-09-25 NOTE — Telephone Encounter (Signed)
Left message on recorder (-) ID. Please return call.  Raiana Pharris MA II

## 2011-09-28 NOTE — Telephone Encounter (Signed)
Left message to patient to call back  Clois Comber SR LVN

## 2011-09-29 ENCOUNTER — Ambulatory Visit

## 2011-10-21 ENCOUNTER — Ambulatory Visit

## 2011-10-27 ENCOUNTER — Ambulatory Visit

## 2011-10-28 ENCOUNTER — Ambulatory Visit

## 2011-10-28 ENCOUNTER — Ambulatory Visit: Payer: Self-pay | Admitting: Pharmacotherapy

## 2011-10-28 LAB — INR: INR: 2.19 — ABNORMAL HIGH (ref 0.87–1.18)

## 2011-11-23 ENCOUNTER — Ambulatory Visit

## 2011-11-24 ENCOUNTER — Ambulatory Visit: Admitting: Internal Medicine

## 2011-11-24 ENCOUNTER — Encounter: Payer: Self-pay | Admitting: Internal Medicine

## 2011-11-24 NOTE — Progress Notes (Signed)
Chief Complaint   Patient presents with    Results    Tinnitus     Chief complaint    Tammy [name] is a 71yr old female presents today with this medical concerns       She does have Tinnitus for  Last 1 year denies any hearing loss   She has history of pulmonary embolus-This was unprovoked,and happened in 2010 in [place]. She was started on warfarin @that  time, without any recurrences of DVT or PE since that time.she also had  paroxysmal atrial fibrillationbut  she was told to continue coumadin      No symptoms of hypo or hyperthyroidism: no decreased or increased weight, no feeling cold/chilly or excessively warm, no diarrhea or constipation, no undue sweatiness, anxiety or palpitations.     Dyslipidemia: she engages in minimal exercise and describes his diet as low fat/low cholesterol  Last lipid panel:  Lab Results   Lab Name Value Date/Time    CHOL 184 09/23/2011  6:53 AM    LDLC 106 09/23/2011  6:53 AM    HDL 65 09/23/2011  6:53 AM    TRIG 63 09/23/2011  6:53 AM     GENERAL: sHe denies fevers, chills, night sweats, fatigue, or unexpected weight change.   sHe denies any exertional chest pain, dyspnea, or palpitations,   Denies reflux symptoms such as burning sensation in epigastrium or acid taste   A complete Review of Systems has been taken and is negative except as otherwise specified.     Current Outpatient Prescriptions   Medication Sig Dispense Refill    Calcium Carbonate-Vit D3-Min (CALTRATE-600 PLUS VITAMIN D3) 600-400 mg-unit PO Tablet Take 1 Tab by mouth. Twice a day  90 Tab  11    Levothyroxine (SYNTHROID) 112 mcg Tablet Take 1 tablet by mouth every day. (thyroid)  30 tablet  12    MULTIVITS W-CA,FE,OTHER MIN (ONE-A-DAY WOMENS FORMULA PO) Take 1 Tab by mouth every day.        Simvastatin (ZOCOR) 40 mg Tablet Take 1 tablet by mouth every day at bedtime.   (cholesterol)  90 tablet  3    Warfarin (COUMADIN) 1 mg Tablet Take 3 tablets by mouth every day. Or as directed by coumadin clinic.  270 tablet   3     History     Social History    Marital Status: MARRIED     Spouse Name: N/A     Number of Children: 2    Years of Education: N/A     Occupational History    used to work with [employer], medical      Social History Main Topics    Smoking status: Never Smoker     Smokeless tobacco: Never Used    Alcohol Use: No    Drug Use: No    Sexually Active: Not on file     Other Topics Concern    Not on file     Social History Narrative    No narrative on file     Vs  BP 116/60  Pulse 60  Resp 16  Ht 1.676 m (5\' 6" )  Wt 77.111 kg (170 lb)  BMI 27.44 kg/m2    Physical exam -  General Appearance: healthy, alert, no distress, pleasant affect, cooperative.  Eyes:  conjunctivae and corneas clear. PERRL, EOM's intact. sclerae normal.  Ears:  external inspection of ears show no abnormality.  Extremities:  no cyanosis, clubbing, or edema.      ASSESMENT AND  PLAN     272.0 Pure hypercholesterolemia  Comment: ldl goal <130  Plan: LIPID PANEL, HEPATIC FUNCTION PANEL           427.31 Atrial fibrillation  Comment: paroxysmal   Plan: on coumadin ,follows with coumadin clinic    388.30 Tinnitus  Comment:   Plan: advised to see ENT she declined     244.9 UNSPECIFIED ACQUIRED HYPOTHYROIDISM  Comment: tsh was elevated in dec synthroid was increased to 112 mcg   Plan: THYROID STIMULATING HORMONE, BASIC METABOLIC         PANEL           I did review patient's past medical and family/social history, no changes noted.  Barriers to Learning assessed: none. Patient verbalizes understanding of teaching and instructions.  Zettie Cooley MD      Electronically Signed By:  Zettie Cooley, MD  Physician associate   [facility] [place]  [phone]

## 2011-11-24 NOTE — Nursing Note (Signed)
>>   Ian Bushman, MA     Tue Nov 24, 2011  3:47 PM  Patient's identification is verified by name and date of birth. Patient has not been seen by any other physician or been hospitalized since the last visit to Dr.Hundal. Patient denies starting any new medications, OTC medications, herbal medications or supplements, vitamins or home remedies.

## 2011-11-25 ENCOUNTER — Ambulatory Visit

## 2011-12-23 ENCOUNTER — Ambulatory Visit

## 2012-01-20 ENCOUNTER — Ambulatory Visit

## 2012-01-21 ENCOUNTER — Ambulatory Visit: Payer: Self-pay | Admitting: Pharmacotherapy

## 2012-02-10 ENCOUNTER — Ambulatory Visit

## 2012-02-10 ENCOUNTER — Ambulatory Visit: Payer: Self-pay | Admitting: Pharmacist

## 2012-02-10 ENCOUNTER — Ambulatory Visit: Payer: MEDICARE

## 2012-02-17 ENCOUNTER — Ambulatory Visit

## 2012-03-08 ENCOUNTER — Other Ambulatory Visit: Payer: Self-pay | Admitting: Internal Medicine

## 2012-03-08 DIAGNOSIS — Z1231 Encounter for screening mammogram for malignant neoplasm of breast: Secondary | ICD-10-CM

## 2012-03-09 ENCOUNTER — Ambulatory Visit: Payer: Self-pay

## 2012-03-09 ENCOUNTER — Ambulatory Visit

## 2012-03-09 ENCOUNTER — Other Ambulatory Visit: Payer: Self-pay | Admitting: Internal Medicine

## 2012-03-09 ENCOUNTER — Ambulatory Visit: Payer: MEDICARE

## 2012-03-09 MED ORDER — WARFARIN 3 MG TABLET
1.5000 mg | ORAL_TABLET | Freq: Every day | ORAL | Status: DC
Start: 2012-03-09 — End: 2012-03-15

## 2012-03-10 MED ORDER — LEVOTHYROXINE 112 MCG TABLET
112.0000 ug | ORAL_TABLET | Freq: Every day | ORAL | Status: DC
Start: 2012-03-09 — End: 2012-12-17

## 2012-03-15 ENCOUNTER — Ambulatory Visit: Payer: MEDICARE

## 2012-03-15 ENCOUNTER — Ambulatory Visit: Payer: Self-pay

## 2012-03-15 ENCOUNTER — Other Ambulatory Visit: Payer: Self-pay | Admitting: Internal Medicine

## 2012-03-15 MED ORDER — WARFARIN 3 MG TABLET
1.5000 mg | ORAL_TABLET | Freq: Every day | ORAL | Status: DC
Start: 2012-03-15 — End: 2012-06-29

## 2012-03-15 NOTE — Telephone Encounter (Signed)
Refill for prescription number [id number]. Last refilled 03/09/2012 to local pharmacy.  Blessed Cotham Katrinka [facility]

## 2012-03-18 ENCOUNTER — Telehealth: Payer: Self-pay | Admitting: Internal Medicine

## 2012-03-18 MED ORDER — WARFARIN 1 MG TABLET
1.0000 mg | ORAL_TABLET | Freq: Every day | ORAL | Status: DC
Start: 2012-03-18 — End: 2012-06-29

## 2012-03-18 NOTE — Telephone Encounter (Signed)
done

## 2012-03-18 NOTE — Telephone Encounter (Signed)
We need a new [facility]in (COUMADIN) 1 mg tablet.  We sent the wrong prescription and [facility] is requesting the right medication to be resent. Can we place orders.    Ardyth Harps, [facility]

## 2012-03-23 ENCOUNTER — Ambulatory Visit

## 2012-04-11 ENCOUNTER — Ambulatory Visit
Admission: RE | Admit: 2012-04-11 | Discharge: 2012-04-11 | Disposition: A | Discharge status: Home / Self Care | Payer: BLUE CROSS/BLUE SHIELD | Source: Ambulatory Visit | Attending: Internal Medicine | Admitting: Internal Medicine

## 2012-04-11 DIAGNOSIS — Z1231 Encounter for screening mammogram for malignant neoplasm of breast: Secondary | ICD-10-CM

## 2012-04-20 ENCOUNTER — Ambulatory Visit

## 2012-04-21 ENCOUNTER — Ambulatory Visit: Payer: Self-pay

## 2012-04-30 ENCOUNTER — Other Ambulatory Visit: Payer: Self-pay | Admitting: Internal Medicine

## 2012-05-02 NOTE — Telephone Encounter (Signed)
This is an automatic refill from pharmacy sent via [facility], no prescription number available from pharmacy, last refilled on 02/02/2012. Routed to PCP. Robyn Nohr MAII

## 2012-05-25 ENCOUNTER — Ambulatory Visit

## 2012-05-25 ENCOUNTER — Ambulatory Visit: Payer: MEDICARE

## 2012-05-25 LAB — INR: INR: 3.02 — ABNORMAL HIGH (ref 0.87–1.18)

## 2012-05-26 ENCOUNTER — Ambulatory Visit: Payer: Self-pay | Admitting: Pharmacotherapy

## 2012-06-29 ENCOUNTER — Ambulatory Visit

## 2012-06-29 ENCOUNTER — Ambulatory Visit: Payer: MEDICARE

## 2012-06-29 ENCOUNTER — Ambulatory Visit: Payer: Self-pay

## 2012-06-29 MED ORDER — WARFARIN 3 MG TABLET
1.5000 mg | ORAL_TABLET | Freq: Every day | ORAL | Status: DC
Start: 2012-06-29 — End: 2013-03-10

## 2012-07-04 ENCOUNTER — Other Ambulatory Visit: Payer: Self-pay | Admitting: Internal Medicine

## 2012-07-04 DIAGNOSIS — R413 Other amnesia: Secondary | ICD-10-CM

## 2012-07-13 ENCOUNTER — Ambulatory Visit: Payer: MEDICARE

## 2012-07-14 ENCOUNTER — Other Ambulatory Visit: Payer: BLUE CROSS/BLUE SHIELD

## 2012-07-14 ENCOUNTER — Ambulatory Visit: Payer: Self-pay | Admitting: Pharmacotherapy

## 2012-07-15 ENCOUNTER — Ambulatory Visit
Admission: RE | Admit: 2012-07-15 | Discharge: 2012-07-15 | Disposition: A | Discharge status: Home / Self Care | Payer: Medicare Other | Source: Ambulatory Visit | Attending: Internal Medicine | Admitting: Internal Medicine

## 2012-07-15 DIAGNOSIS — R413 Other amnesia: Secondary | ICD-10-CM

## 2012-07-15 MED ORDER — IOHEXOL 300 MG/ML  SOLN
75.0000 mL | Freq: Once | INTRAMUSCULAR | Status: AC | PRN
  Administered 2012-07-15: 75 mL via INTRAVENOUS

## 2012-07-27 ENCOUNTER — Ambulatory Visit

## 2012-07-27 ENCOUNTER — Ambulatory Visit: Payer: MEDICARE

## 2012-07-28 ENCOUNTER — Ambulatory Visit: Payer: Self-pay | Admitting: Pharmacotherapy

## 2012-08-30 ENCOUNTER — Ambulatory Visit: Payer: MEDICARE

## 2012-08-31 ENCOUNTER — Other Ambulatory Visit: Payer: Self-pay | Admitting: Pharmacist

## 2012-08-31 ENCOUNTER — Ambulatory Visit: Payer: MEDICARE

## 2012-09-01 ENCOUNTER — Encounter (HOSPITAL_COMMUNITY): Payer: Self-pay

## 2012-09-01 ENCOUNTER — Emergency Department (HOSPITAL_COMMUNITY): Payer: Medicare Other

## 2012-09-01 ENCOUNTER — Emergency Department (HOSPITAL_COMMUNITY)
Admission: EM | Admit: 2012-09-01 | Discharge: 2012-09-01 | Disposition: A | Discharge status: Home / Self Care | Payer: Medicare Other | Attending: Emergency Medicine | Admitting: Emergency Medicine

## 2012-09-01 ENCOUNTER — Ambulatory Visit: Payer: Self-pay | Admitting: Pharmacist

## 2012-09-01 DIAGNOSIS — E079 Disorder of thyroid, unspecified: Secondary | ICD-10-CM | POA: Insufficient documentation

## 2012-09-01 DIAGNOSIS — R030 Elevated blood-pressure reading, without diagnosis of hypertension: Secondary | ICD-10-CM | POA: Insufficient documentation

## 2012-09-01 DIAGNOSIS — Z8659 Personal history of other mental and behavioral disorders: Secondary | ICD-10-CM | POA: Insufficient documentation

## 2012-09-01 DIAGNOSIS — R5383 Other fatigue: Secondary | ICD-10-CM

## 2012-09-01 DIAGNOSIS — I059 Rheumatic mitral valve disease, unspecified: Secondary | ICD-10-CM | POA: Insufficient documentation

## 2012-09-01 DIAGNOSIS — R51 Headache: Secondary | ICD-10-CM | POA: Insufficient documentation

## 2012-09-01 DIAGNOSIS — R5381 Other malaise: Secondary | ICD-10-CM | POA: Insufficient documentation

## 2012-09-01 DIAGNOSIS — Z8719 Personal history of other diseases of the digestive system: Secondary | ICD-10-CM | POA: Insufficient documentation

## 2012-09-01 HISTORY — DX: Gastro-esophageal reflux disease without esophagitis: K21.9

## 2012-09-01 HISTORY — DX: Depression, unspecified: F32.A

## 2012-09-01 HISTORY — DX: Nonrheumatic mitral (valve) prolapse: I34.1

## 2012-09-01 HISTORY — DX: Major depressive disorder, single episode, unspecified: F32.9

## 2012-09-01 LAB — BASIC METABOLIC PANEL
BUN: 16 mg/dL (ref 6–23)
Calcium: 9.8 mg/dL (ref 8.4–10.5)
GFR calc Af Amer: >90 mL/min (ref >90)
GFR calc non Af Amer: 84 mL/min — ABNORMAL LOW (ref >90)
Glucose, Bld: 89 mg/dL (ref 70–99)
Sodium: 140 mEq/L (ref 135–145)

## 2012-09-01 LAB — URINALYSIS, ROUTINE W REFLEX MICROSCOPIC
Bilirubin Urine: NEGATIVE
Hgb urine dipstick: NEGATIVE
Nitrite: NEGATIVE
Protein, ur: NEGATIVE mg/dL
Urobilinogen, UA: 0.2 mg/dL (ref 0.0–1.0)

## 2012-09-01 LAB — CBC WITH DIFFERENTIAL/PLATELET
Basophils Relative: 1 % (ref 0–1)
Eosinophils Absolute: 0.2 10*3/uL (ref 0.0–0.7)
Eosinophils Relative: 2 % (ref 0–5)
Lymphs Abs: 2.2 10*3/uL (ref 0.7–4.0)
MCH: 28.8 pg (ref 26.0–34.0)
MCHC: 32.5 g/dL (ref 30.0–36.0)
MCV: 88.8 fL (ref 78.0–100.0)
Neutrophils Relative %: 57 % (ref 43–77)
Platelets: 259 10*3/uL (ref 150–400)
RBC: 3.92 MIL/uL (ref 3.87–5.11)

## 2012-09-01 LAB — URINE MICROSCOPIC-ADD ON

## 2012-09-01 MED ORDER — SODIUM CHLORIDE 0.9 % IV SOLN
INTRAVENOUS | Status: DC
  Administered 2012-09-01: 14:00:00 via INTRAVENOUS

## 2012-09-01 MED ORDER — HYDROCODONE-ACETAMINOPHEN 5-500 MG PO TABS: 1.0000 | ORAL_TABLET | Freq: Four times a day (QID) | ORAL | Status: DC | PRN

## 2012-09-01 NOTE — ED Provider Notes (Signed)
History     CSN: [id number]  Arrival date & time 09/01/12  1141   First MD Initiated Contact with Patient 09/01/12 1207      Chief Complaint  Patient presents with  . Weakness    (Consider location/radiation/quality/duration/timing/severity/associated sxs/prior treatment) Patient is a 71 y.o. female presenting with weakness. The history is provided by the patient and a relative.  Weakness Primary symptoms do not include headaches, nausea or vomiting.  Additional symptoms include weakness. Additional symptoms do not include neck stiffness.   patient states with a two-week history of fatigue and weakness. She's also had a week and a history of headaches and her blood pressure being elevated at times. She has some blurred vision and photophobia. She also states that she's had some difficulty speaking. She states that she has seen her primary care Dr. and had a CAT scan a month ago for some similar symptoms. She states her blood pressure was running high up to 180s systolic. She states her blood pressure normally runs low. She states she had a dry cough and feeling that she was choking. She states she's also had difficulty getting some words out. She's also had some memory issues.  Past Medical History  Diagnosis Date  . Mitral valve prolapse   . GERD (gastroesophageal reflux disease)   . Thyroid disease   . Depression     Past Surgical History  Procedure Date  . Toe surgery   . Cholecystectomy   . Back surgery   . Bladder surgery   . Abdominal hysterectomy   . Carpel tunnel     No family history on file.  History  Substance Use Topics  . Smoking status: Never Smoker   . Smokeless tobacco: Not on file  . Alcohol Use: Yes    OB History    Grav Para Term Preterm Abortions TAB SAB Ect Mult Living                  Review of Systems  Constitutional: Positive for fatigue. Negative for activity change and appetite change.  HENT: Negative for neck stiffness.   Eyes:  Negative for pain.  Respiratory: Negative for chest tightness and shortness of breath.   Cardiovascular: Negative for chest pain and leg swelling.  Gastrointestinal: Negative for nausea, vomiting, abdominal pain and diarrhea.  Genitourinary: Negative for flank pain.  Musculoskeletal: Negative for back pain.  Skin: Negative for rash.  Neurological: Positive for weakness. Negative for numbness and headaches.  Psychiatric/Behavioral: Negative for behavioral problems.    Allergies  Morphine and related  Home Medications   Current Outpatient Rx  Name  Route  Sig  Dispense  Refill  . ASPIRIN EC 81 MG PO TBEC   Oral   Take 81 mg by mouth daily.         . BUPROPION HCL ER (XL) 150 MG PO TB24   Oral   Take 150 mg by mouth daily.         . CYANOCOBALAMIN 1000 MCG PO TABS   Oral   Take 1,000 mcg by mouth daily.         Marland Kitchen ESCITALOPRAM OXALATE 20 MG PO TABS   Oral   Take 20 mg by mouth daily.         Marland Kitchen LEVOTHYROXINE SODIUM 75 MCG PO TABS   Oral   Take 75 mcg by mouth daily.         Marland Kitchen METOPROLOL SUCCINATE ER 50 MG PO TB24   Oral  Take 50 mg by mouth daily. Take with or immediately following a meal.         . OMEPRAZOLE 40 MG PO CPDR   Oral   Take 40 mg by mouth daily.         Marland Kitchen PHENYLEPHRINE-ACETAMINOPHEN 5-325 MG PO CAPS   Oral   Take 2 capsules by mouth every 6 (six) hours as needed. As needed for congestion.         Marland Kitchen PRAVASTATIN SODIUM 40 MG PO TABS   Oral   Take 40 mg by mouth at bedtime.         . TRAMADOL HCL 50 MG PO TABS   Oral   Take 50 mg by mouth every 6 (six) hours as needed. As needed for pain.         Marland Kitchen HYDROCODONE-ACETAMINOPHEN 5-500 MG PO TABS   Oral   Take 1-2 tablets by mouth every 6 (six) hours as needed for pain.   8 tablet   0     BP 114/58  Pulse 61  Temp 98.3 F (36.8 C) (Oral)  Resp 14  SpO2 96%  Physical Exam  Nursing note and vitals reviewed. Constitutional: She is oriented to person, place, and time. She  appears well-developed and well-nourished.  HENT:  Head: Normocephalic and atraumatic.  Eyes: EOM are normal. Pupils are equal, round, and reactive to light.  Neck: Normal range of motion. Neck supple.  Cardiovascular: Normal rate, regular rhythm and normal heart sounds.   No murmur heard. Pulmonary/Chest: Effort normal and breath sounds normal. No respiratory distress. She has no wheezes. She has no rales.  Abdominal: Soft. Bowel sounds are normal. She exhibits no distension. There is no tenderness. There is no rebound and no guarding.  Musculoskeletal: Normal range of motion.  Neurological: She is alert and oriented to person, place, and time. No cranial nerve deficit.  Skin: Skin is warm and dry.  Psychiatric: She has a normal mood and affect. Her speech is normal.    ED Course  Procedures (including critical care time)  Labs Reviewed  CBC WITH DIFFERENTIAL - Abnormal; Notable for the following:    Hemoglobin 11.3 (*)     HCT 34.8 (*)     All other components within normal limits  BASIC METABOLIC PANEL - Abnormal; Notable for the following:    GFR calc non Af Amer 84 (*)     All other components within normal limits  URINALYSIS, ROUTINE W REFLEX MICROSCOPIC - Abnormal; Notable for the following:    Leukocytes, UA MODERATE (*)     All other components within normal limits  TROPONIN I  URINE MICROSCOPIC-ADD ON  URINE CULTURE   Dg Chest 2 View  09/01/2012  *RADIOLOGY REPORT*  Clinical Data: Dizziness, fatigue, blurred vision, history hypertension  CHEST - 2 VIEW  Comparison: 09/23/2009  Findings: Upper-normal size of cardiac silhouette. Mediastinal contours and pulmonary vascularity normal. Lungs clear. No pleural effusion or pneumothorax. Bones unremarkable.  IMPRESSION: No acute abnormalities.   Original Report Authenticated By: Ulyses Southward, M.D.    Ct Head Wo Contrast  09/01/2012  *RADIOLOGY REPORT*  Clinical Data: Weakness, fatigue, headache  CT HEAD WITHOUT CONTRAST   Technique:  Contiguous axial images were obtained from the base of the skull through the vertex without contrast.  Comparison: 07/15/2012  Findings: Stable atrophy and patchy white matter microvascular ischemic changes in the periventricular and subcortical white matter.  No acute intracranial hemorrhage, mass lesion, definite acute infarction, or extra-axial  fluid collection.  Cisterns patent.  No cerebellar abnormality.  Symmetric orbits.  Mastoids and sinuses clear.  IMPRESSION: Stable atrophy and microvascular ischemic changes.   Original Report Authenticated By: Judie Petit. Shick, M.D.      1. Fatigue      Date: 09/01/2012  Rate: 54  Rhythm: sinus bradycardia  QRS Axis: normal  Intervals: normal  ST/T Wave abnormalities: normal  Conduction Disutrbances:none  Narrative Interpretation:   Old EKG Reviewed: none available    MDM  Showed multiple complaints. Lab work is overall reassuring except for white cells in the urine. No clear reason to admit. Patient has had some difficulty speaking, calibers had 2 negative CTs. She'll likely need outpatient workup including possibly an MRI. She was discharged home. Urine culture was sent. She's not appear to be hypertensive urgency and does not appear to be an acute stroke        Juliet Rude. Rubin Payor, MD 09/01/12 [id number]

## 2012-09-01 NOTE — ED Notes (Signed)
Pt presents with 1 week h/o fatigue and weakness.  Pt reports 2 day h/o headache that pt points to top of her head as to location of pain.  +blurred vision; intermittent nausea, +photophobia;  Pt reports dry cough, reports choking sensation yesterday.

## 2012-09-02 LAB — URINE CULTURE: Colony Count: NO GROWTH

## 2012-09-06 ENCOUNTER — Ambulatory Visit

## 2012-09-06 NOTE — Nursing Note (Signed)
>>   Tammy [name], [place]     Tue Sep 06, 2012  8:09 AM  The Influenza Vaccine VIS document for the flu injection was given to patient to review. Patient or person named in permission has answered no to any history of egg allergy, previous serious reaction to a influenza vaccine or current illness which would preclude them receiving an immunization. Any questions were referred to the physician. The Influenza Vaccine was then administered per protocol. The patient was observed for immediate reactions to the vaccine per protocol.pt verified x2 None were observed.    Tammy [name], [place]

## 2012-09-21 ENCOUNTER — Telehealth: Payer: Self-pay | Admitting: Internal Medicine

## 2012-09-21 NOTE — Telephone Encounter (Signed)
Spoke with patient using x3 identifiers. Pt was given information per doc request, pt verbalized understanding,. Donald Jacque Oates LVN

## 2012-09-21 NOTE — Telephone Encounter (Signed)
She is calling for the results of lab test done at [facility] last month.   She would like advice and a phone call back.

## 2012-09-21 NOTE — Telephone Encounter (Signed)
Labs test within normal limits

## 2012-09-26 NOTE — Addendum Note (Signed)
Addended by: Conley Canal on: 09/26/2012 02:18 PM     Modules accepted: Orders

## 2012-09-27 ENCOUNTER — Ambulatory Visit: Payer: MEDICARE

## 2012-09-27 ENCOUNTER — Ambulatory Visit: Payer: Self-pay

## 2012-09-28 ENCOUNTER — Ambulatory Visit: Payer: MEDICARE

## 2012-10-26 ENCOUNTER — Ambulatory Visit: Payer: Self-pay | Admitting: Pharmacotherapy

## 2012-10-26 ENCOUNTER — Ambulatory Visit: Payer: MEDICARE

## 2012-11-30 ENCOUNTER — Ambulatory Visit: Payer: MEDICARE

## 2012-12-01 ENCOUNTER — Ambulatory Visit: Payer: Self-pay

## 2012-12-03 ENCOUNTER — Other Ambulatory Visit: Payer: Self-pay

## 2012-12-17 ENCOUNTER — Other Ambulatory Visit: Payer: Self-pay | Admitting: Internal Medicine

## 2012-12-22 ENCOUNTER — Ambulatory Visit: Payer: MEDICARE

## 2012-12-28 ENCOUNTER — Ambulatory Visit: Payer: MEDICARE

## 2012-12-28 ENCOUNTER — Ambulatory Visit: Payer: Self-pay

## 2013-01-05 ENCOUNTER — Encounter (HOSPITAL_COMMUNITY): Payer: Self-pay | Admitting: Emergency Medicine

## 2013-01-05 ENCOUNTER — Emergency Department (HOSPITAL_COMMUNITY): Payer: Medicare Other

## 2013-01-05 ENCOUNTER — Emergency Department (HOSPITAL_COMMUNITY)
Admission: EM | Admit: 2013-01-05 | Discharge: 2013-01-05 | Disposition: A | Discharge status: Home / Self Care | Payer: Medicare Other | Attending: Emergency Medicine | Admitting: Emergency Medicine

## 2013-01-05 DIAGNOSIS — J209 Acute bronchitis, unspecified: Secondary | ICD-10-CM | POA: Insufficient documentation

## 2013-01-05 DIAGNOSIS — R51 Headache: Secondary | ICD-10-CM

## 2013-01-05 DIAGNOSIS — J40 Bronchitis, not specified as acute or chronic: Secondary | ICD-10-CM

## 2013-01-05 DIAGNOSIS — Z79899 Other long term (current) drug therapy: Secondary | ICD-10-CM | POA: Insufficient documentation

## 2013-01-05 DIAGNOSIS — E079 Disorder of thyroid, unspecified: Secondary | ICD-10-CM | POA: Insufficient documentation

## 2013-01-05 DIAGNOSIS — Z7982 Long term (current) use of aspirin: Secondary | ICD-10-CM | POA: Insufficient documentation

## 2013-01-05 DIAGNOSIS — F329 Major depressive disorder, single episode, unspecified: Secondary | ICD-10-CM | POA: Insufficient documentation

## 2013-01-05 DIAGNOSIS — F3289 Other specified depressive episodes: Secondary | ICD-10-CM | POA: Insufficient documentation

## 2013-01-05 DIAGNOSIS — IMO0002 Reserved for concepts with insufficient information to code with codable children: Secondary | ICD-10-CM | POA: Insufficient documentation

## 2013-01-05 DIAGNOSIS — R509 Fever, unspecified: Secondary | ICD-10-CM | POA: Insufficient documentation

## 2013-01-05 DIAGNOSIS — K219 Gastro-esophageal reflux disease without esophagitis: Secondary | ICD-10-CM | POA: Insufficient documentation

## 2013-01-05 DIAGNOSIS — R059 Cough, unspecified: Secondary | ICD-10-CM | POA: Insufficient documentation

## 2013-01-05 DIAGNOSIS — I059 Rheumatic mitral valve disease, unspecified: Secondary | ICD-10-CM | POA: Insufficient documentation

## 2013-01-05 DIAGNOSIS — R079 Chest pain, unspecified: Secondary | ICD-10-CM | POA: Insufficient documentation

## 2013-01-05 LAB — CBC
Hemoglobin: 12.4 g/dL (ref 12.0–15.0)
MCHC: 33.5 g/dL (ref 30.0–36.0)
RDW: 13.4 % (ref 11.5–15.5)
WBC: 7.5 10*3/uL (ref 4.0–10.5)

## 2013-01-05 LAB — URINALYSIS, ROUTINE W REFLEX MICROSCOPIC
Ketones, ur: NEGATIVE mg/dL
Leukocytes, UA: NEGATIVE
Nitrite: NEGATIVE
Protein, ur: NEGATIVE mg/dL
Urobilinogen, UA: 1 mg/dL (ref 0.0–1.0)

## 2013-01-05 LAB — POCT I-STAT TROPONIN I

## 2013-01-05 LAB — BASIC METABOLIC PANEL
Chloride: 102 mEq/L (ref 96–112)
GFR calc Af Amer: >90 mL/min (ref >90)
GFR calc non Af Amer: 86 mL/min — ABNORMAL LOW (ref >90)
Potassium: 4.3 mEq/L (ref 3.5–5.1)
Sodium: 141 mEq/L (ref 135–145)

## 2013-01-05 MED ORDER — ALBUTEROL SULFATE HFA 108 (90 BASE) MCG/ACT IN AERS
4.0000 | INHALATION_SPRAY | Freq: Once | RESPIRATORY_TRACT | Status: AC
  Administered 2013-01-05: 4 via RESPIRATORY_TRACT
  Filled 2013-01-05: qty 6.7

## 2013-01-05 MED ORDER — ALBUTEROL SULFATE HFA 108 (90 BASE) MCG/ACT IN AERS: 2.0000 | INHALATION_SPRAY | RESPIRATORY_TRACT | Status: DC | PRN

## 2013-01-05 MED ORDER — NITROGLYCERIN 0.4 MG SL SUBL: 0.4000 mg | SUBLINGUAL_TABLET | SUBLINGUAL | Status: DC | PRN

## 2013-01-05 MED ORDER — ALBUTEROL SULFATE (5 MG/ML) 0.5% IN NEBU
5.0000 mg | INHALATION_SOLUTION | Freq: Once | RESPIRATORY_TRACT | Status: AC
  Administered 2013-01-05: 5 mg via RESPIRATORY_TRACT
  Filled 2013-01-05: qty 1

## 2013-01-05 MED ORDER — BENZONATATE 100 MG PO CAPS
100.0000 mg | ORAL_CAPSULE | Freq: Once | ORAL | Status: AC
  Administered 2013-01-05: 100 mg via ORAL
  Filled 2013-01-05: qty 1

## 2013-01-05 MED ORDER — BENZONATATE 100 MG PO CAPS: 100.0000 mg | ORAL_CAPSULE | Freq: Three times a day (TID) | ORAL | Status: DC | PRN

## 2013-01-05 MED ORDER — IBUPROFEN 400 MG PO TABS
600.0000 mg | ORAL_TABLET | Freq: Once | ORAL | Status: AC
  Administered 2013-01-05: 600 mg via ORAL
  Filled 2013-01-05: qty 1

## 2013-01-05 MED ORDER — ASPIRIN 325 MG PO TABS: 325.0000 mg | ORAL_TABLET | ORAL | Status: DC

## 2013-01-05 MED ORDER — DOXYCYCLINE HYCLATE 100 MG PO CAPS: 100.0000 mg | ORAL_CAPSULE | Freq: Two times a day (BID) | ORAL | Status: DC

## 2013-01-05 NOTE — ED Notes (Signed)
Pt states for the last few days has had increasing SOB, cough, fever to 101.3 at home and headache. Pt with abnormal lungs sounds, cough for the last month, over the last week started bringing up brown mucous. Sent here by PMD for further eval

## 2013-01-05 NOTE — ED Notes (Signed)
MD at bedside. 

## 2013-01-05 NOTE — ED Notes (Signed)
Per EMT HR increased and SpO2 decreased with coughing exacerbation. Pt placed on 2L [place] for comfort. MD notified.

## 2013-01-05 NOTE — ED Provider Notes (Signed)
History     CSN: [id number]  Arrival date & time 01/05/13  1427   First MD Initiated Contact with Patient 01/05/13 1728     Chief Complaint  Patient presents with  . Shortness of Breath   Patient is a 72 y.o. female presenting with shortness of breath.  Shortness of Breath Associated symptoms: cough, fever and headaches   Associated symptoms: no abdominal pain, no chest pain, no rash and no vomiting     72 y/o female with history as noted to below who presents with cc cough. The patient states her symptoms began approximately one month ago. During the past week she has had persistent coughing which has been productive. The cough is getting worse. She saw her pcp earlier this week and was started on prednisone. She was set up for outpatient PFT's but was unable to perform the test from her cough and shortness of breath. She states she had a fever of 101.3 yesterday. She states she has chest pain that is sharp in nature when she coughs.   Past Medical History  Diagnosis Date  . Mitral valve prolapse   . GERD (gastroesophageal reflux disease)   . Thyroid disease   . Depression     Past Surgical History  Procedure Laterality Date  . Toe surgery    . Cholecystectomy    . Back surgery    . Bladder surgery    . Abdominal hysterectomy    . Carpel tunnel      History reviewed. No pertinent family history.  History  Substance Use Topics  . Smoking status: Never Smoker   . Smokeless tobacco: Not on file  . Alcohol Use: Yes    OB History   Grav Para Term Preterm Abortions TAB SAB Ect Mult Living                  Review of Systems  Constitutional: Positive for fever. Negative for chills.  HENT: Negative for congestion and rhinorrhea.   Respiratory: Positive for cough, chest tightness and shortness of breath.   Cardiovascular: Negative for chest pain.  Gastrointestinal: Negative for nausea, vomiting and abdominal pain.  Genitourinary: Negative for dysuria and frequency.   Skin: Negative for rash.  Neurological: Positive for headaches.  All other systems reviewed and are negative.    Allergies  Morphine and related  Home Medications   Current Outpatient Rx  Name  Route  Sig  Dispense  Refill  . aspirin EC 81 MG tablet   Oral   Take 81 mg by mouth daily.         Marland Kitchen buPROPion (WELLBUTRIN XL) 150 MG 24 hr tablet   Oral   Take 150 mg by mouth daily.         . Calcium-Vitamin D 500-125 MG-UNIT TABS   Oral   Take 1 tablet by mouth daily.         . cholecalciferol (VITAMIN D) 1000 UNITS tablet   Oral   Take 1,000 Units by mouth daily.         . cyanocobalamin 1000 MCG tablet   Oral   Take 1,000 mcg by mouth daily.         . diclofenac sodium (VOLTAREN) 1 % GEL   Topical   Apply 1 application topically 2 (two) times daily.         Marland Kitchen escitalopram (LEXAPRO) 20 MG tablet   Oral   Take 20 mg by mouth daily.         Marland Kitchen  guaiFENesin (MUCINEX) 600 MG 12 hr tablet   Oral   Take 1,200 mg by mouth 2 (two) times daily.         Marland Kitchen levothyroxine (SYNTHROID, LEVOTHROID) 88 MCG tablet   Oral   Take 88 mcg by mouth daily.         . metoprolol succinate (TOPROL-XL) 50 MG 24 hr tablet   Oral   Take 50 mg by mouth daily. Take with or immediately following a meal.         . naproxen sodium (ANAPROX) 220 MG tablet   Oral   Take 220 mg by mouth 2 (two) times daily as needed (for pain).         Marland Kitchen omeprazole (PRILOSEC) 40 MG capsule   Oral   Take 40 mg by mouth daily.         . pravastatin (PRAVACHOL) 40 MG tablet   Oral   Take 40 mg by mouth at bedtime.         . predniSONE (DELTASONE) 20 MG tablet   Oral   Take 40 mg by mouth daily. 7 day course started 01/04/2013         . albuterol (PROVENTIL HFA;VENTOLIN HFA) 108 (90 BASE) MCG/ACT inhaler   Inhalation   Inhale 2 puffs into the lungs every 4 (four) hours as needed for wheezing.   6.7 g   3   . benzonatate (TESSALON) 100 MG capsule   Oral   Take 1 capsule (100 mg  total) by mouth 3 (three) times daily as needed for cough.   21 capsule   0   . doxycycline (VIBRAMYCIN) 100 MG capsule   Oral   Take 1 capsule (100 mg total) by mouth 2 (two) times daily. One po bid x 7 days   14 capsule   0     BP 137/51  Pulse 57  Temp(Src) 98.3 F (36.8 C) (Oral)  Resp 14  Ht 5\' 2"  (1.575 m)  Wt 150 lb (68.04 kg)  BMI 27.43 kg/m2  SpO2 95%  Physical Exam  Nursing note and vitals reviewed. Constitutional: She is oriented to person, place, and time. She appears well-developed and well-nourished.  HENT:  Head: Normocephalic and atraumatic.  Mouth/Throat: No oropharyngeal exudate.  Eyes: Conjunctivae are normal. Pupils are equal, round, and reactive to light.  Neck: Normal range of motion. Neck supple. No Kernig's sign noted.  Cardiovascular: Normal rate and regular rhythm.  Exam reveals no gallop and no friction rub.   No murmur heard. Pulmonary/Chest: Effort normal. She has wheezes (diffuse).  Abdominal: Soft. Bowel sounds are normal. She exhibits no distension. There is no tenderness.  Musculoskeletal: Normal range of motion. She exhibits no edema and no tenderness.  Neurological: She is alert and oriented to person, place, and time. No cranial nerve deficit or sensory deficit. Coordination normal. GCS eye subscore is 4. GCS verbal subscore is 5. GCS motor subscore is 6.  Skin: Skin is warm and dry.  Psychiatric: She has a normal mood and affect.    ED Course  Procedures (including critical care time)  Labs Reviewed  BASIC METABOLIC PANEL - Abnormal; Notable for the following:    Glucose, Bld 135 (*)    GFR calc non Af Amer 86 (*)    All other components within normal limits  CBC  URINALYSIS, ROUTINE W REFLEX MICROSCOPIC  POCT I-STAT TROPONIN I   Dg Chest 2 View  01/05/2013  *RADIOLOGY REPORT*  Clinical Data: Shortness of  breath with coughing and wheezing.  CHEST - 2 VIEW  Comparison: 01/02/2013  Findings: Two views of the chest demonstrate  clear lungs. Heart and mediastinum are within normal limits.  The trachea is midline. Bony thorax is intact. There may be a cardiac lead sticker overlying the right upper chest.  This was not present on the recent comparison examination.  IMPRESSION: No acute cardiopulmonary disease.   Original Report Authenticated By: Richarda Overlie, M.D.    Ct Head Wo Contrast  01/05/2013  *RADIOLOGY REPORT*  Clinical Data: Fever.  Headache.  CT HEAD WITHOUT CONTRAST  Technique:  Contiguous axial images were obtained from the base of the skull through the vertex without contrast.  Comparison: Unenhanced cranial CT 09/01/2012, 07/15/2012.  Findings: Ventricular system normal in size appearance, unchanged. Mild cortical and cerebellar atrophy, unchanged.  Severe changes of small vessel disease of the white matter diffusely, unchanged.  No mass lesion.  No midline shift.  No acute hemorrhage or hematoma. No extra-axial fluid collections.  No evidence of acute infarction.  No skull fracture or other focal osseous abnormality involving the skull.  Visualized paranasal sinuses, bilateral mastoid air cells, and bilateral middle ear cavities well-aerated.  Minimal bilateral carotid siphon atherosclerosis.  IMPRESSION:  1.  No acute intracranial abnormality. 2.  Stable mild cortical and cerebellar atrophy and stable severe chronic microvascular ischemic changes of the white matter.   Original Report Authenticated By: Hulan Saas, M.D.    1. Bronchitis   2. Headache     MDM  72 y/o female with history as noted to below who presents with cc cough and fever. Afebrile. Well appearing. No hypoxia or tachypnea on room air. CXR not c/w pneumonia. Diffuse wheezing on exam. Likely bronchitis. The patient had significant improvement in her symptoms after nebulizer treatment. CT head negative. Doubt ICH or meningitis given normal neuro exam and no meningeal signs. EKG without acute ischemic changes. Likely bronchitis. The patient was  discharged home with script for doxycycline, albuterol, and tessalon pearls. She was instructed to continue taking the prednisone she was prescribed by her pcp as well as using 4 puffs of albuterol every 4 hours for the next 48 hours. Return precautions given and discussed with the patient who was in agreement with the plan.         Shanon Ace, MD 01/05/13 2326

## 2013-01-09 NOTE — ED Provider Notes (Signed)
I saw and evaluated the patient, reviewed the resident's note and I agree with the findings and plan. Patient with URI symptoms and cough. Has had cough for a while but worsened recently. Not hypoxic. Cystic off and on for months started doxycycline. She is able and leg without hypoxia. She also has a headache. Head CT is negative. Patient feels better after treatment. Patient has had a few possible episodes of laryngospasm with the coughing. She does start to breathe and spontaneously. Family is nervous about this since their father died a few months ago. Do not think this is a risk of death with these episodes. After extensive discussion the patient was discharged home  Juliet Rude. Rubin Payor, MD 01/09/13 1453

## 2013-01-25 ENCOUNTER — Ambulatory Visit: Payer: Self-pay

## 2013-01-25 ENCOUNTER — Ambulatory Visit: Payer: MEDICARE

## 2013-02-14 ENCOUNTER — Telehealth: Payer: Self-pay | Admitting: Internal Medicine

## 2013-02-14 NOTE — Telephone Encounter (Signed)
Patient stopped into office to request additional lab orders be placed.  She will be doing her standing INR orders next week and would like to also have her Thyroid and Cholesterol checked.  Please place orders accordingly.

## 2013-02-14 NOTE — Telephone Encounter (Signed)
Ordered

## 2013-03-01 ENCOUNTER — Ambulatory Visit: Payer: MEDICARE

## 2013-03-01 ENCOUNTER — Ambulatory Visit: Payer: Self-pay

## 2013-03-07 ENCOUNTER — Telehealth: Payer: Self-pay | Admitting: Internal Medicine

## 2013-03-07 NOTE — Telephone Encounter (Signed)
Labs normal ,she is due for follow up

## 2013-03-07 NOTE — Telephone Encounter (Signed)
She is calling for the results of lab test done at [facility] 03/01/2013.   She would like advice and a phone call back.

## 2013-03-10 ENCOUNTER — Other Ambulatory Visit: Payer: Self-pay | Admitting: Internal Medicine

## 2013-03-10 NOTE — Telephone Encounter (Signed)
Requested Prescriptions     Pending Prescriptions Disp Refills    Warfarin (COUMADIN) 3 mg Tablet [Pharmacy Med Name: WARFARIN SODIUM TABS 3MG ] 90 tablet 3     Sig: Take 1 tablet by mouth every day.

## 2013-03-10 NOTE — Telephone Encounter (Signed)
Patient has been informed.

## 2013-03-15 ENCOUNTER — Other Ambulatory Visit: Payer: Self-pay

## 2013-03-15 ENCOUNTER — Ambulatory Visit: Payer: Self-pay

## 2013-03-15 DIAGNOSIS — Z1231 Encounter for screening mammogram for malignant neoplasm of breast: Secondary | ICD-10-CM

## 2013-03-28 ENCOUNTER — Other Ambulatory Visit: Payer: Self-pay | Admitting: Internal Medicine

## 2013-03-28 MED ORDER — SIMVASTATIN 40 MG TABLET
1.0000 | ORAL_TABLET | Freq: Every day | ORAL | Status: DC
Start: 2013-03-28 — End: 2013-03-29

## 2013-03-29 ENCOUNTER — Other Ambulatory Visit: Payer: Self-pay | Admitting: Internal Medicine

## 2013-03-29 ENCOUNTER — Ambulatory Visit: Payer: Self-pay

## 2013-03-29 ENCOUNTER — Ambulatory Visit: Payer: MEDICARE

## 2013-03-29 MED ORDER — LEVOTHYROXINE 112 MCG TABLET
1.0000 | ORAL_TABLET | Freq: Every day | ORAL | Status: DC
Start: 2013-03-29 — End: 2014-03-24

## 2013-03-29 MED ORDER — SIMVASTATIN 40 MG TABLET
1.0000 | ORAL_TABLET | Freq: Every day | ORAL | Status: DC
Start: 2013-03-29 — End: 2013-03-30

## 2013-03-29 MED ORDER — WARFARIN 3 MG TABLET
3.0000 mg | ORAL_TABLET | Freq: Every day | ORAL | Status: DC
Start: 2013-03-29 — End: 2013-11-29

## 2013-03-29 NOTE — Telephone Encounter (Signed)
Patient is requesting 90 day supply of the selected medications. However, she needs prescription for COUMADIN 3MG , not 1mg .  For some reason we cannot delete the 1mg , please order the 3mg .  Thank you.

## 2013-03-30 ENCOUNTER — Other Ambulatory Visit: Payer: Self-pay | Admitting: Internal Medicine

## 2013-03-30 MED ORDER — SIMVASTATIN 40 MG TABLET
1.0000 | ORAL_TABLET | Freq: Every day | ORAL | Status: DC
Start: 2013-03-30 — End: 2014-03-25

## 2013-04-13 ENCOUNTER — Ambulatory Visit
Admission: RE | Admit: 2013-04-13 | Discharge: 2013-04-13 | Disposition: A | Discharge status: Home / Self Care | Payer: Medicare Other | Source: Ambulatory Visit

## 2013-04-13 DIAGNOSIS — Z1231 Encounter for screening mammogram for malignant neoplasm of breast: Secondary | ICD-10-CM

## 2013-04-26 ENCOUNTER — Ambulatory Visit: Payer: Medicare Other | Attending: General Practice

## 2013-04-26 ENCOUNTER — Ambulatory Visit: Payer: Self-pay

## 2013-04-26 DIAGNOSIS — I4891 Unspecified atrial fibrillation: Secondary | ICD-10-CM | POA: Insufficient documentation

## 2013-04-26 DIAGNOSIS — I2699 Other pulmonary embolism without acute cor pulmonale: Secondary | ICD-10-CM | POA: Insufficient documentation

## 2013-05-01 ENCOUNTER — Ambulatory Visit: Payer: MEDICARE

## 2013-05-03 ENCOUNTER — Ambulatory Visit: Payer: Medicare Other | Admitting: Cardiovascular Disease

## 2013-05-03 VITALS — BP 120/70 | HR 72 | Resp 16 | Ht 66.0 in | Wt 176.0 lb

## 2013-05-03 NOTE — Nursing Note (Signed)
>>   Darrell Jewel, LVN     Wed May 03, 2013 10:55 AM  ID verified x 2. Patient states there HAS been a change in their medication status due to:  1) Recent hospitalization or other physician visit  OR  2) Started taking either OTC medications, herbal supplements, vitamins or home remedy.Pt screened for pain and allergies.  Vernona Rieger Palmer,LVN

## 2013-05-03 NOTE — Progress Notes (Signed)
Patient Active Problem List   Diagnosis    Hypothyroidism    ROUTINE MEDICAL EXAM    OTHER MALAISE AND FATIGUE    left supraclavicular fullness    LOSS OF WEIGHT    PURE HYPERCHOLESTEROLEMIA    PE (pulmonary embolism)    Atrial fibrillation    Encounter for long-term (current) use of anticoagulants    PE (physical exam)     ALLERGIES:    Enoxaparin    Rash    Comment:When she had PE, diffuse rash, biopsied    Outpatient Prescriptions Marked as Taking for the 05/03/13 encounter (Office Visit) with Merril Abbe Des Willene Hatchet, MD   Medication Sig Dispense Refill    LevoTHYROxine (SYNTHROID) 112 mcg Tablet Take 1 tablet by mouth every day. (thyroid)  90 tablet  3    Simvastatin (ZOCOR) 40 mg Tablet Take 1 tablet by mouth every day at bedtime. (cholesterol)  90 tablet  3    Warfarin (COUMADIN) 1 mg Tablet Take 1 tablet by mouth every day.  90 tablet  1    Warfarin (COUMADIN) 3 mg Tablet Take 1 tablet by mouth every day.  90 tablet  3     No Facility-Administered Medications for the 05/03/13 encounter (Office Visit) with Merril Abbe Des Pois, MD.       Indication for visit: 2 yr follow up for atrial fibrillation, in this patient with multiple medical problems. She has been Dr. Manya Silvas Hermiz's patient and last saw him in 2011.                                 And then saw Dr. Dahlia Client 07/07/11, and I am uncertain why she was scheduled to see me today, but I have never seen her before, and chart review required as new                                patient to me.    But she has a hx of having seen her hematologist for assessment of need for anticoagulations with her history of pulmonary embolism.   The hematologist recommended that she discontinue warfarin.   She sought a second opinion with Dr. Cliffton Asters, who recommended that she continue on warfarin.  She has had a little erratic control of her warfarin, but she describes compliance, and no bleeding or CNS event.     Past cardiac and medical history is reviewed per EMR and  with patient and noted as above.  Allergies and drug intolerances are noted and confirmed as above.  Medications are reviewed and noted as above.    Interval history: as follows:  She is still working 8 hours a shift, and on her feet, with selling antiques.  Going twice this month to get an INR, "because the #'s were off", and thinks the people at the anticoagulation clinic are outstanding.     "None", with regards to no chest pain.  "Yeah, it is" as to a problem with her breathing, but only when it is hot and working hard at work, will she notice that she has to take a few deep breaths.    And not reporting any palpitations.   With regards to lightheadedness, "You know, no".   Has had right > left sided edema, and used compression stockings on big sale days, but wears on the right side only (R DVT).  When shopped at "[employer] for 5 hours on Tuesday", both sides became swollen, but she soaked in a cold water pool afterward, and it resolved.   No orthopnea or PND.  Fatigue has not been problematic. "Oh, yeah, I'm a steam train". In fact, she is off Tuesdays and Wednesdays, and on Thursdays when she comes back, she said it is "with huge energy".     With regards to her eating habits, and sodium restriction: "I could be better." Admits she was eating fries, and also pepperoni pizza at work.    Social history update:    - Lived in [place] growing up, and baked in the sun with baby oil.   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 Has knives and machetes at the store, and she will always make sure one of the young men is around when customers are in looking at them, as she feels somewhat unsafe in that regard.  - Never smoked.   - No alcohol.   - Drinks 3 cups of coffee a day.  - Is married.    ROS:   CONSTITUTIONAL:   No change in weight,  change in appetite,  change in sleep,  fever, or chills.  DERM: Sun related lesions.  CARDIO: Please see  above.  HEENT:   No headache,  focal visual change, or sinus symptoms.  RESP:  No cough, wheezing, or sx's to suggest infection.  GI:  No reflux sx's, new abdominal pain, or change in bowel movements.  GU, GYN, NEPH:   No change in urinary habits, pain, or bleeding.  MS:   No new arthralgia, diffuse myalgia, or joint swelling.  NEURO:   No interval sx's of a focal nature.  END:  Changed to a higher dose of supplementation for her hypothyroidism in 2012, and no change since. Remains overweight.  PVD:  No claudication, described PAD, or carotid disease.  HEMATOPOIETIC: No abnormal bruising or bleeding, 2007 [place] suggested a mass at the base of her left neck, but saw Dr. Duard Brady and CT showed no abnormality.  Complete organs systems were reviewed, and are unchanged from last visit, other than otherwise noted.    Physical Exam: BP 120/70  Pulse 72  Resp 16  Ht 1.676 m (5\' 6" )  Wt 79.833 kg (176 lb)  BMI 28.42 kg/m2    GEN: NAD.   SKIN: warm and dry. HEENT: AT/NC, pupils are regular, equal and round. No xanthelasma or arcus senilis. Conjunctiva and oral mucosa are normal in color.    NECK: No JVD or carotid bruits.  LUNGS: clear.  COR: PMI is nondisplaced, RRR, S1 and S2 are normal, no gallop, or murmur.  ABD: soft, NT, without organomegaly or bruits. EXT: without cyanosis or clubbing, trace edema and no femoral bruits.  NEURO: Alert and oriented. No focal motor weakness, numbness, or cerebellar signs. No tremor. Gait and speech are normal. PSYCH: Mood is quite good, affect appropriate, and she appears euthymic.     LAB: reviewed and discussed with the patient.  Recent INR readings:  Component      Latest Ref Rng 04/26/2013 03/29/2013 03/15/2013 03/01/2013 01/25/2013           6:31 AM  6:46 AM  6:31 AM  6:29 AM  6:46 AM   INR      0.87-1.18 1.95 (H) 1.74 (H) 2.69 (H) 3.86 (H) 2.11 (H)  Component      Latest Ref Rng 12/28/2012 11/30/2012 10/26/2012           6:56 AM  7:04 AM  6:40 AM   INR      0.87-1.18 2.68 (H) 2.66 (H)  2.86 (H)     Component      Latest Ref Rng 03/01/2013 08/31/2012           8:02 AM  6:54 AM   FASTING       YES YES   CHOLESTEROL      0-200 mg/dL 161 096   HDL CHOLESTEROL      >=35 mg/dL 65 59   LDL CHOLESTEROL CALCULATION      <130 mg/dL 89 045   TOTAL CHOLESTEROL:HDL RATIO      <4.0 2.8 3.2   TRIGLYCERIDE      35-160 mg/dL 409 811   NON-HDL CHOLESTEROL      0-150 mg/dL 914 782     Component      Latest Ref Rng 03/01/2013 08/31/2012 06/29/2012           8:02 AM  6:54 AM  6:50 AM   THYROID STIMULATING HORMONE      0.35-3.30 uIU/mL 1.61 2.85 1.75     Component      Latest Ref Rng 03/01/2013 08/31/2012           8:02 AM  6:54 AM   SODIUM      135-145 mEq/L 140 138   POTASSIUM      3.3-5.0 mEq/L 3.8 3.8   CHLORIDE      95-110 mEq/L 105 105   CARBON DIOXIDE TOTAL      24-32 mEq/L 28 27   UREA NITROGEN, BLOOD (BUN)      8-22 mg/dL 11 10   CREATININE BLOOD      0.44-1.27 mg/dL 9.56 2.13   E-GFR, AFRICAN AMERICAN       Test not performed >60   E-GFR, NON-AFRICAN AMERICAN       Test not performed >60   GLUCOSE      70-99 mg/dL 98 95   CALCIUM      0.8-65.7 mg/dL 8.8 8.7     Component      Latest Ref Rng 03/01/2013 08/31/2012           8:02 AM  6:54 AM   PROTEIN      6.3-8.3 g/dL 6.5 6.3   ALBUMIN      3.2-4.6 g/dL 3.6 3.6   ALKALINE PHOSPHATASE (ALP)      35-115 U/L 60 57   ASPARTATE TRANSAMINASE (AST)      15-43 U/L 24 22   BILIRUBIN TOTAL      0.3-1.3 mg/dL 0.4 0.6   ALANINE TRANSFERASE (ALT)      5-54 U/L 18 18   BILIRUBIN DIRECT      0.0-0.2 mg/dL <8.4 <6.9     Component      Latest Ref Rng 01/25/2013 09/27/2012           6:46 AM  6:30 AM   WHITE BLOOD CELL COUNT      4.5-11.0 K/MM3 5.7 6.7   RED CELL COUNT      4.0-5.2 M/MM3 4.32 4.19   HEMOGLOBIN      12.0-16.0 GM/DL 62.9 52.8   HEMATOCRIT      36-46 % 39.5 38.9   MCV      80-100 UM3 91.4 92.9   MCH  27-33 PG 30.9 32.0   MCHC      32-36 % 33.8 34.4   RDW      0-14.7 UNITS 14.2 14.7   MPV      6.8-10.0 UM3 8.8 9.0   PLATELET COUNT      130-400 K/MM3 267 279    NEUTROPHILS % AUTO       56.7 57.3   LYMPHOCYTES % AUTO       31.8 31.7   MONOCYTES % AUTO       8.3 7.4   EOSINOPHIL % AUTO       2.6 2.6   BASOPHILS % AUTO       0.6 1.0   NEUTROPHIL ABS AUTO      1.80-7.70 K/MM3 3.30 3.80   LYMPHOCYTE ABS AUTO      1.0-4.8 K/MM3 1.8 2.1   MONOCYTES ABS AUTO      0.1-0.8 K/MM3 0.5 0.5   EOSINOPHIL ABS AUTO      0-0.5 K/MM3 0.2 0.2   BASOPHILS ABS AUTO      0-0.2 K/MM3 0 0.1     EKG: Today: SR at 73 bpm, leftward axis consistent with age, and WNL, and compared with 07/07/11, there has been no significant change.    Baseline Echocardiography:  2D & M mode echocardiography:  Morphologically normal aortic, mitral and tricuspid valves;  Normal cardiac chamber size;  Normal left ventricular wall thickness;  Normal left ventricular wall motion; (EF was 71%).  Color flow mapping and doppler study:  Mild pulmonic regurgitation;  Mild tricuspid regurgitation;  RV systolic pressure was estimated to be 33 mmHg.  Treadmill exercise test:  Patient was exercised for 6.55 minutes on the Bruce exercise protocol, achieving a maxinum heart rate of 144 bpm, which was 94 % of the aged -predicted maxinum heart rate. Test was terminated due to Patient fatigue. Patient had No chest discomfort.   Baseline EKG:  Rhythm: Sinus rhythm ; Rate: 82 bpm; PR: 0.16 sec,; QRS: 0.08 sec.; QT : 0.40 sec.; Within normal limits   BP 100 / 70 mmHg;  There was one supraventricular premature beat prior to exercise test on monitor  Exercise EKG: showed No ST segment changes Diagnostic for myocardial ischemia. ;   BP rose to 130/ 60 mmHg;  There were One isolated ventricular premature beats During exercise ;  Stress Echocardiogram: showed No ischemic segmental wall motion abnormality;  Diagnosis:  Exercise EKG showed No ischemic ST abnormality;  Stress Echocardiogram showed No ischemic segmental wall motion abnormality.  Patient had No chest pain with exercise.      IMPRESSION:  (427.31) Atrial fibrillation   (primary encounter diagnosis)  Comment: Without symptomatic recurrence, and maintaining SR here today. This occurred related to her PE.  Plan: POC ELECTROCARDIOGRAM WITH RHYTHM STRIP        Reviewed results of testing, limitations of testing, and implications of the results.    (415.19) PE (pulmonary embolism)  Comment: By history, but no longer on [facility] for this reason.  Plan: POC ELECTROCARDIOGRAM WITH RHYTHM STRIP          (V58.61) Encounter for long-term (current) use of anticoagulants  Comment: And patient is confirmed to be compliant with Rx and LAB and knowledgeable.  Plan: Discussed options of alternate OAC, and as she is doing well currently, she feels no need to change. I actually printed up handouts on Pradaxa and Xarelto (no clinical reference for Eliquis on the computer under clinical references), but she was  not even interested in taking them.     (780.79) Other malaise and fatigue  Comment: Relatively minor considering her still working and all she does. The rest she requires seems not inordinate for what she does.  Plan: POC ELECTROCARDIOGRAM WITH RHYTHM STRIP          (272.0) Pure hypercholesterolemia  Comment: Well controlled on simvastatin, with normal hepatic function, and no muscle complaints.   Plan: Continue same.    (244.9) Hypothyroidism  Comment: Euthyroid on current replacement by lab and lack of sx's.  Plan: Continue same, and monitoring per Dr. Parks Neptune.    (V85.24) BMI 28 kg/m2 and above  Comment: She knows what food items to avoid that she doesn't.  Plan: Dietary counseling.       This document was generated utilizing Dragon voice recognition software, and  occasional transcription errors may escape proof reading.  Electronically Signed By:    Chrystine Oiler. Earline Mayotte, MD  Attending Physician  [facility]  Cardiology  [address], [place], [place]  [address]  [phone] - office

## 2013-05-09 ENCOUNTER — Encounter: Payer: Medicare Other | Admitting: Internal Medicine

## 2013-05-13 DIAGNOSIS — Z6828 Body mass index (BMI) 28.0-28.9, adult: Secondary | ICD-10-CM | POA: Insufficient documentation

## 2013-05-16 ENCOUNTER — Ambulatory Visit: Payer: Medicare Other | Attending: Internal Medicine | Admitting: Internal Medicine

## 2013-05-16 ENCOUNTER — Encounter: Payer: Self-pay | Admitting: Internal Medicine

## 2013-05-16 VITALS — BP 110/60 | HR 67 | Temp 97.5°F | Wt 172.4 lb

## 2013-05-16 DIAGNOSIS — R5381 Other malaise: Secondary | ICD-10-CM | POA: Insufficient documentation

## 2013-05-16 DIAGNOSIS — I4891 Unspecified atrial fibrillation: Secondary | ICD-10-CM | POA: Insufficient documentation

## 2013-05-16 DIAGNOSIS — I2699 Other pulmonary embolism without acute cor pulmonale: Secondary | ICD-10-CM | POA: Insufficient documentation

## 2013-05-16 NOTE — Nursing Note (Signed)
>>   Nahir Drema Pry, MA     Tue May 16, 2013  3:46 PM  Patient identification verified by last name and date of birth.  Here for a f/up.  Vital signs taken and recorded. Patient screened for pain. Reviewed pharmacy, allergies, current medications and supplements. Patient has been asked if hospitalized since last clinic visit.  Nahir Drema Pry, MA I

## 2013-05-16 NOTE — Progress Notes (Signed)
Chief Complaint   Patient presents with    Other     f/up      Tammy [name] presents with multiple medical concerns    No symptoms of hypo or hyperthyroidism: no decreased or increased weight, no feeling cold/chilly or excessively warm, no diarrhea or constipation, no undue sweatiness, anxiety or palpitations.   Lab Results   Lab Name Value Date/Time    TSH 1.61 03/01/2013  8:02 AM    FT4 1.27 06/17/2009  8:16 AM     Pulmonary embolism: Patient has no shortness of breath, orthopnea or PND. She had no chest pain.   She has been Feeling tired ,works  8 hrs a day      Paroxysmal atrial fibrillation -. The patient denied any cardiac palpitations and has no syncope or lightheadedness symptoms. Patient is on warfarin anti-coagulation     She Declines colonoscopy  And stool testing     GENERAL: SHe denies fevers, chills, night sweats, fatigue, or unexpected weight change.   SHe denies any exertional chest pain, dyspnea, or palpitations,   Denies reflux symptoms such as burning sensation in epigastrium or acid taste   A complete Review of Systems has been taken and is negative except as otherwise specified.     Current Outpatient Prescriptions   Medication Sig Dispense Refill    LevoTHYROxine (SYNTHROID) 112 mcg Tablet Take 1 tablet by mouth every day. (thyroid)  90 tablet  3    Simvastatin (ZOCOR) 40 mg Tablet Take 1 tablet by mouth every day at bedtime. (cholesterol)  90 tablet  3    Warfarin (COUMADIN) 1 mg Tablet Take 1 tablet by mouth every day.  90 tablet  1    Warfarin (COUMADIN) 3 mg Tablet Take 1 tablet by mouth every day.  90 tablet  3     No current facility-administered medications for this visit.     History     Social History    Marital Status: MARRIED     Spouse Name: N/A     Number of Children: 2    Years of Education: N/A     Occupational History    used to work with animals      Social History Main Topics    Smoking status: Never Smoker     Smokeless tobacco: Never Used    Alcohol Use: No    Drug  Use: No    Sexually Active: Not on file     Other Topics Concern    Not on file     Social History Narrative    No narrative on file     VS  BP 110/60  Pulse 67  Temp(Src) 36.4 C (97.5 F) (Tympanic)  Wt 78.2 kg (172 lb 6.4 oz)  BMI 27.84 kg/m2  SpO2 98%    Physical exam -  General Appearance: healthy, alert, no distress, pleasant affect, cooperative.  Eyes:  conjunctivae and corneas clear. PERRL, EOM's intact. sclerae normal.  Ears:  external inspection of ears show no abnormality.  Extremities:  no cyanosis, clubbing, or edema.    ASSESMENT AND PLAN     (780.79) Fatigue  (primary encounter diagnosis)  Comment:   Plan: VITAMIN B12, VITAMIN D, 25 HYDROXY            (244.9) Hypothyroidism  Comment:   Plan: on treatment     (427.31) Atrial fibrillation  Comment:   Plan: ON TREATMENT     (415.19) PE (pulmonary embolism)  Comment: no recurrence  Plan:      I did review patient's past medical and family/social history, no changes noted.  Barriers to Learning assessed: none. Patient verbalizes understanding of teaching and instructions.  Harvie Heck MD      Electronically Signed By:  Harvie Heck, MD  Physician associate   [place]  [phone]

## 2013-05-17 ENCOUNTER — Ambulatory Visit: Payer: Self-pay

## 2013-05-17 ENCOUNTER — Ambulatory Visit (INDEPENDENT_AMBULATORY_CARE_PROVIDER_SITE_OTHER): Payer: Medicare Other

## 2013-05-18 ENCOUNTER — Telehealth: Payer: Self-pay | Admitting: Internal Medicine

## 2013-05-18 NOTE — Telephone Encounter (Signed)
Patient's husband notified.Identification verified X3

## 2013-05-18 NOTE — Telephone Encounter (Signed)
Let her know her Vitamin D is  Very low. start taking over the counter 5000 units of Vitamin D daily for one month then 2000 units daily  Her vit b12 is on low side ,take vit b12 1000 mcg daily

## 2013-05-18 NOTE — Telephone Encounter (Signed)
Not able to id patient from machine, left message to call back. Nyheim Seufert MAII

## 2013-05-24 ENCOUNTER — Other Ambulatory Visit: Payer: Self-pay

## 2013-05-31 ENCOUNTER — Ambulatory Visit: Payer: Self-pay

## 2013-05-31 ENCOUNTER — Ambulatory Visit: Payer: Medicare Other | Attending: General Practice

## 2013-05-31 DIAGNOSIS — I4891 Unspecified atrial fibrillation: Secondary | ICD-10-CM | POA: Insufficient documentation

## 2013-05-31 DIAGNOSIS — I2699 Other pulmonary embolism without acute cor pulmonale: Secondary | ICD-10-CM | POA: Insufficient documentation

## 2013-06-16 ENCOUNTER — Ambulatory Visit (INDEPENDENT_AMBULATORY_CARE_PROVIDER_SITE_OTHER): Payer: Medicare Other | Admitting: Internal Medicine

## 2013-06-16 ENCOUNTER — Encounter: Payer: Self-pay | Admitting: Internal Medicine

## 2013-06-16 VITALS — BP 146/78 | HR 52 | Temp 97.9°F | Ht 62.0 in | Wt 150.0 lb

## 2013-06-16 DIAGNOSIS — J45991 Cough variant asthma: Secondary | ICD-10-CM

## 2013-06-16 MED ORDER — BUDESONIDE-FORMOTEROL FUMARATE 80-4.5 MCG/ACT IN AERO: 2.0000 | INHALATION_SPRAY | Freq: Two times a day (BID) | RESPIRATORY_TRACT | Status: DC

## 2013-06-16 NOTE — Progress Notes (Signed)
  Subjective:    Patient ID: Suzanne Galloway, female    DOB: [birth year]   MRN: [id number]  HPI  72 yowf with dx of recurrent bronchitis yearly starting around 2005 typically around November and better with zpak/predisone referred 06/16/2013 by Dr Kirby Funk for sob > cough onset March 2014  06/16/2013 1st [facility] office visit/ Larena Ohnemus cc sob > cough  X 5 months better with inhalers then flare again when they wear off, best one is albuterol and no better on symbicort so changed to duelera x sev days prior to OV ( But hfa nearly 0% at initial ov ) and not effective yet. Sob x > slow adls.  No obvious day to day or  daytime variabilty or assoc c r cp or chest tightness, subjective wheeze overt sinus or hb symptoms. No unusual exp hx or h/o childhood pna/ asthma or knowledge of premature birth.   Sleeping ok without nocturnal  or early am exacerbation  of respiratory  c/o's or need for noct saba. Also denies any obvious fluctuation of symptoms with weather or environmental changes or other aggravating or alleviating factors except as outlined above   Current Medications, Allergies, Past Medical History, Past Surgical History, Family History, and Social History were reviewed in [employer] record.     Review of Systems  Constitutional: Negative for fever, chills and unexpected weight change.  HENT: Positive for voice change. Negative for ear pain, nosebleeds, congestion, sore throat, rhinorrhea, sneezing, trouble swallowing, dental problem, postnasal drip and sinus pressure.   Eyes: Negative for visual disturbance.  Respiratory: Positive for cough and shortness of breath. Negative for choking.   Cardiovascular: Negative for chest pain and leg swelling.  Gastrointestinal: Negative for vomiting, abdominal pain and diarrhea.  Genitourinary: Negative for difficulty urinating.  Musculoskeletal: Positive for arthralgias.  Skin: Negative for rash.  Neurological: Positive for  headaches. Negative for tremors and syncope.  Hematological: Does not bruise/bleed easily.       Objective:   Physical Exam  Wt Readings from Last 3 Encounters:  06/16/13 150 lb (68.04 kg)  01/05/13 150 lb (68.04 kg)     Full upper, partial lower mostly pseudowheeze  HEENT: nl dentition, turbinates, and orophanx. Nl external ear canals without cough reflex   NECK :  without JVD/Nodes/TM/ nl carotid upstrokes bilaterally   LUNGS: no acc muscle use, clear to A and P bilaterally without cough on insp or exp maneuvers   CV:  RRR  no s3 or murmur or increase in P2, no edema   ABD:  soft and nontender with nl excursion in the supine position. No bruits or organomegaly, bowel sounds nl  MS:  warm without deformities, calf tenderness, cyanosis or clubbing  SKIN: warm and dry without lesions    NEURO:  alert, approp, no deficits   01/05/13 cxr No acute cardiopulmonary disease.        Assessment & Plan:

## 2013-06-16 NOTE — Patient Instructions (Addendum)
Change Symbicort  To 80 strength and  Take 2 puffs first thing in am and then another 2 puffs about 12 hours later.   Hold the dulera for now   Only use your albuterol (ventolin) as a rescue medication to be used if you can't catch your breath by resting or doing a relaxed purse lip breathing pattern. The less you use it, the better it will work when you need it. Ok to use it up to every 4 hours but the goal is not to needed at all   Continue prilosec Take 30-60 min before first meal of the day and pepcid ac mg at bedtime   GERD (REFLUX)  is an extremely common cause of respiratory symptoms, many times with no significant heartburn at all.    It can be treated with medication, but also with lifestyle changes including avoidance of late meals, excessive alcohol, smoking cessation, and avoid fatty foods, chocolate, peppermint, colas, red wine, and acidic juices such as orange juice.  NO MINT OR MENTHOL PRODUCTS SO NO COUGH DROPS  USE SUGARLESS CANDY INSTEAD (jolley ranchers or Stover's)  NO OIL BASED VITAMINS - use powdered substitutes.  Work on inhaler technique:  relax and gently blow all the way out then take a nice smooth deep breath back in, triggering the inhaler at same time you start breathing in.  Hold for up to 5 seconds if you can.  Rinse and gargle with water when done    Please schedule a follow up office visit in 2 weeks, sooner if needed

## 2013-06-18 DIAGNOSIS — J45991 Cough variant asthma: Secondary | ICD-10-CM | POA: Insufficient documentation

## 2013-06-18 NOTE — Assessment & Plan Note (Signed)
.  The most common causes of chronic cough in immunocompetent adults include the following: upper airway cough syndrome (UACS), previously referred to as postnasal drip syndrome (PNDS), which is caused by variety of rhinosinus conditions; (2) asthma; (3) GERD; (4) chronic bronchitis from cigarette smoking or other inhaled environmental irritants; (5) nonasthmatic eosinophilic bronchitis; and (6) bronchiectasis.   These conditions, singly or in combination, have accounted for up to 94% of the causes of chronic cough in prospective studies.   Other conditions have constituted no >6% of the causes in prospective studies These have included bronchogenic carcinoma, chronic interstitial pneumonia, sarcoidosis, left ventricular failure, ACEI-induced cough, and aspiration from a condition associated with pharyngeal dysfunction.    Chronic cough is often simultaneously caused by more than one condition. A single cause has been found from 38 to 82% of the time, multiple causes from 18 to 62%. Multiply caused cough has been the result of three diseases up to 42% of the time.      Most likely this is chronic asthma expressed as predominantly a cough though could certainly alos have silent gerd  The problem is that although she can get away with swallowing albuterol and getting some benefit, the same is not true of ics  The proper method of use, as well as anticipated side effects, of a metered-dose inhaler are discussed and demonstrated to the patient. Improved effectiveness after extensive coaching during this visit to a level of approximately  75% from a baseline of nearly 0  rec trial of symbicort 80 2bid using the principle that sometimes less is more (lower dose of ics to upper airway will help limit cough from ics irritation. Suzanne Galloway

## 2013-06-27 ENCOUNTER — Ambulatory Visit (INDEPENDENT_AMBULATORY_CARE_PROVIDER_SITE_OTHER): Payer: Medicare Other | Admitting: Internal Medicine

## 2013-06-27 ENCOUNTER — Encounter: Payer: Self-pay | Admitting: Internal Medicine

## 2013-06-27 VITALS — BP 140/78 | HR 53 | Temp 97.4°F | Ht 62.0 in | Wt 151.8 lb

## 2013-06-27 DIAGNOSIS — Z23 Encounter for immunization: Secondary | ICD-10-CM

## 2013-06-27 DIAGNOSIS — J45991 Cough variant asthma: Secondary | ICD-10-CM

## 2013-06-27 NOTE — Progress Notes (Signed)
Subjective:    Patient ID: Suzanne Galloway, female    DOB: 1941/03/02   MRN: [id number]   Brief patient profile:  19 yowf never smoker with dx of recurrent bronchitis yearly starting around 2005 typically around November and better with zpak/predisone referred 06/16/2013 by Dr Suzanne Galloway for sob > cough onset March 2014   HPI 06/16/2013 1st [facility] office visit/ Suzanne Galloway cc sob > cough  X 5 months better with inhalers then flare again when they wear off, best one is albuterol and no better on symbicort so changed to dulera x sev days prior to OV ( But hfa nearly 0% at initial ov ) and not effective yet. Sob x > slow adls  rec Change Symbicort  To 80 strength and  Take 2 puffs first thing in am and then another 2 puffs about 12 hours later.  Hold the dulera for now Only use your albuterol (ventolin) as a rescue medication  Continue prilosec Take 30-60 min before first meal of the day and pepcid ac mg at bedtime  GERD  Diet  Work on inhaler technique    06/27/2013 f/u ov/Suzanne Galloway    Chief Complaint  Patient presents with  . Follow-up    Breathing and cough have improved some. She is having some cough this am- non prod and she relates to the damp weather. She has not had to use rescue inhaler since her last visit.   much better at hfa but still on pred ? 40  ? Daily.   No obvious day to day or  daytime variabilty or assoc   cp or chest tightness, subjective wheeze overt sinus or hb symptoms. No unusual exp hx or h/o childhood pna/ asthma or knowledge of premature birth.   Sleeping ok without nocturnal  or early am exacerbation  of respiratory  c/o's or need for noct saba. Also denies any obvious fluctuation of symptoms with weather or environmental changes or other aggravating or alleviating factors except as outlined above   Current Medications, Allergies, Complete Past Medical History, Past Surgical History, Family History, and Social History were reviewed in [employer] record.  ROS  The following are not active complaints unless bolded sore throat, dysphagia, dental problems, itching, sneezing,  nasal congestion or excess/ purulent secretions, ear ache,   fever, chills, sweats, unintended wt loss, pleuritic or exertional cp, hemoptysis,  orthopnea pnd or leg swelling, presyncope, palpitations, heartburn, abdominal pain, anorexia, nausea, vomiting, diarrhea  or change in bowel or urinary habits, change in stools or urine, dysuria,hematuria,  rash, arthralgias, visual complaints, headache, numbness weakness or ataxia or problems with walking or coordination,  change in [name] or memory.           Objective:   Physical Exam    amb wf nad   Wt Readings from Last 3 Encounters:  06/27/13 151 lb 12.8 oz (68.856 kg)  06/16/13 150 lb (68.04 kg)  01/05/13 150 lb (68.04 kg)     HEENT: Full upper, partial lower  turbinates, and orophanx. Nl external ear canals without cough reflex   NECK :  without JVD/Nodes/TM/ nl carotid upstrokes bilaterally   LUNGS: no acc muscle use, clear to A and P bilaterally without cough on insp or exp maneuvers   CV:  RRR  no s3 or murmur or increase in P2, no edema   ABD:  soft and nontender with nl excursion in the supine position. No bruits or organomegaly, bowel sounds nl  MS:  warm without deformities, calf tenderness, cyanosis or clubbing  SKIN: warm and dry without lesions    NEURO:  alert, approp, no deficits     01/05/13 cxr No acute cardiopulmonary disease.        Assessment & Plan:   Outpatient Encounter Prescriptions as of 06/27/2013  Medication Sig Dispense Refill  . albuterol (PROVENTIL HFA;VENTOLIN HFA) 108 (90 BASE) MCG/ACT inhaler Inhale 2 puffs into the lungs every 4 (four) hours as needed for wheezing.  6.7 g  3  . atorvastatin (LIPITOR) 10 MG tablet Take 10 mg by mouth daily.      . budesonide-formoterol (SYMBICORT) 80-4.5 MCG/ACT inhaler Inhale 2 puffs into the lungs 2 (two) times  daily.      . Calcium-Vitamin D 500-125 MG-UNIT TABS Take 1 tablet by mouth daily.      . cholecalciferol (VITAMIN D) 1000 UNITS tablet Take 1,000 Units by mouth daily.      . cyanocobalamin 1000 MCG tablet Take 1,000 mcg by mouth daily.      Marland Kitchen escitalopram (LEXAPRO) 20 MG tablet Take 20 mg by mouth daily.      Marland Kitchen guaiFENesin (MUCINEX) 600 MG 12 hr tablet Take 1,200 mg by mouth 2 (two) times daily as needed.       Marland Kitchen levothyroxine (SYNTHROID, LEVOTHROID) 75 MCG tablet 1 tablet every mon, wed, fri      . levothyroxine (SYNTHROID, LEVOTHROID) 88 MCG tablet 1 tablet every tues, thurs, sat, sun      . loratadine (CLARITIN) 10 MG tablet Take 10 mg by mouth daily.      . metoprolol succinate (TOPROL-XL) 50 MG 24 hr tablet Take 50 mg by mouth daily. Take with or immediately following a meal.      . naproxen sodium (ANAPROX) 220 MG tablet Take 220 mg by mouth 2 (two) times daily as needed (for pain).      Marland Kitchen omeprazole (PRILOSEC) 40 MG capsule Take 40 mg by mouth daily.      . predniSONE (DELTASONE) 20 MG tablet Take 40 mg by mouth daily.       No facility-administered encounter medications on file as of 06/27/2013.

## 2013-06-27 NOTE — Patient Instructions (Addendum)
Continue symbicort 80  Take 2 puffs first thing in am and then another 2 puffs about 12 hours later and keep working on perfecting your inhaler technique  Only use your albuterol as a rescue medication to be used if you can't catch your breath by resting or doing a relaxed purse lip breathing pattern. The less you use it, the better it will work when you need it.  Goal is less than twice weekly  Taper off the prednisone as per Dr Valentina Lucks and return if flare off it.

## 2013-06-27 NOTE — Assessment & Plan Note (Signed)
-   hfa 75% with coaching 06/27/2013  - spirometry 06/27/2013   FEV1 1.92 (96%) ratio 72 and FEF 25-75  68%  All goals of chronic asthma control met including optimal function and elimination of symptoms with minimal need for rescue therapy.  Contingencies discussed in full including contacting this office immediately if not controlling the symptoms using the rule of two's.   At this point needs to taper off prednisone and then return if cough flares.  Other option is add singulair trail or doubt  the dose of ICS - prefer singulair to double to the dose of ics due to effects of ics on upper airway which tend to cause cough

## 2013-06-28 ENCOUNTER — Ambulatory Visit: Payer: Medicare Other | Attending: General Practice

## 2013-06-28 ENCOUNTER — Ambulatory Visit: Payer: Self-pay

## 2013-06-28 DIAGNOSIS — I2699 Other pulmonary embolism without acute cor pulmonale: Secondary | ICD-10-CM | POA: Insufficient documentation

## 2013-06-28 DIAGNOSIS — I4891 Unspecified atrial fibrillation: Secondary | ICD-10-CM | POA: Insufficient documentation

## 2013-07-12 ENCOUNTER — Ambulatory Visit: Payer: Self-pay | Admitting: Pharmacotherapy

## 2013-07-12 ENCOUNTER — Ambulatory Visit: Payer: Medicare Other | Attending: General Practice

## 2013-07-12 DIAGNOSIS — I4891 Unspecified atrial fibrillation: Secondary | ICD-10-CM | POA: Insufficient documentation

## 2013-07-12 DIAGNOSIS — I2699 Other pulmonary embolism without acute cor pulmonale: Secondary | ICD-10-CM | POA: Insufficient documentation

## 2013-07-13 ENCOUNTER — Ambulatory Visit: Payer: Medicare Other

## 2013-07-13 NOTE — Nursing Note (Signed)
>>   Ermalene Postin, LVN     Thu Jul 13, 2013  9:04 AM  The Influenza Vaccine VIS document for the flu injection was given to patient to review. Patient or person named in permission has answered no to any history of egg allergy, previous serious reaction to a influenza vaccine or current illness which would preclude them receiving an immunization. Any questions were referred to the physician. The Influenza Vaccine was then administered per protocol. The patient was observed for immediate reactions to the vaccine per protocol. None were observed.    Ermalene Postin, LVN   Pt verified x2

## 2013-07-18 ENCOUNTER — Telehealth: Payer: Self-pay | Admitting: Internal Medicine

## 2013-07-18 DIAGNOSIS — E559 Vitamin D deficiency, unspecified: Secondary | ICD-10-CM | POA: Insufficient documentation

## 2013-07-18 NOTE — Telephone Encounter (Signed)
She can continue taking vitamin D two  thousand milligrams daily and Vitamin B12 thousand micrograms daily   recheck vit d and vit b levels in 1 month

## 2013-07-18 NOTE — Telephone Encounter (Signed)
Patient is asking what mg Vit B and Vit D should she be taking. Patient states that she went from taking 5000 to 2000 mg per doctor orders. Please advise    Candace Braswell  [facility]   Professional Dr[facility]   Internal Medicine

## 2013-07-18 NOTE — Telephone Encounter (Signed)
Patient identification verified x3.  Patient informed and stated she has a lab appointment for her INR November 12th. She will wait until then to get her labs done.  Tammy [name] [name]

## 2013-07-26 ENCOUNTER — Ambulatory Visit: Payer: Self-pay | Admitting: Pharmacotherapy

## 2013-07-26 ENCOUNTER — Ambulatory Visit: Payer: Medicare Other | Attending: General Practice

## 2013-07-26 DIAGNOSIS — I2699 Other pulmonary embolism without acute cor pulmonale: Secondary | ICD-10-CM | POA: Insufficient documentation

## 2013-07-26 DIAGNOSIS — I4891 Unspecified atrial fibrillation: Secondary | ICD-10-CM | POA: Insufficient documentation

## 2013-08-07 ENCOUNTER — Telehealth: Payer: Self-pay | Admitting: Cardiovascular Disease

## 2013-08-07 NOTE — Telephone Encounter (Signed)
I discussed the one that Dr. Nedra Hai had done prior, and as it showed no disease to follow up on, and her AF was due to her pulmonary embolism, and has not recurred, I don't feel there is a reason to do one, unless she has new sx's.  Thanks,   Electronically Signed By:    Chrystine Oiler. Earline Mayotte, MD  Attending Physician  [facility]  Cardiology  [address], [place], [place]  [address]  [phone] - office

## 2013-08-07 NOTE — Telephone Encounter (Signed)
Patient had office visit on 05/03/13 and remembers Dr. Earline Mayotte mentioning an echo. I don't see an order. She is wanting to know if she needs to get one.

## 2013-08-07 NOTE — Telephone Encounter (Signed)
Three verifiers were used to identify this patient, patient name, patient DOB, and/or known to me/ address. Tammy [name]   Patient notified, doesn't have any new symptoms.

## 2013-08-09 ENCOUNTER — Ambulatory Visit: Payer: Medicare Other | Attending: General Practice

## 2013-08-09 ENCOUNTER — Ambulatory Visit: Payer: Self-pay

## 2013-08-09 DIAGNOSIS — I2699 Other pulmonary embolism without acute cor pulmonale: Secondary | ICD-10-CM | POA: Insufficient documentation

## 2013-08-09 DIAGNOSIS — I4891 Unspecified atrial fibrillation: Secondary | ICD-10-CM | POA: Insufficient documentation

## 2013-08-09 NOTE — Progress Notes (Signed)
Greetings, Dr. Parks Neptune!    We most certainly hope this message finds you well.    Your patient Tammy [name] was referred to the anticoagulation clinic for monitoring on warfarin  for the treatment of pulmonary embolism and atrial fibrillation.  In compliance with university policies, we must update our records annually and determine the length of therapy, even if long-term or lifelong.  Please let us know how long you would like to continue [name] on warfarin and if we are using for the appropriate indication. You can do this by placing an ANNUAL UPDATED ANTICOAGULATION REFERRAL. Feel free to message back or call the clinic [phone] with questions.     Thank you,  Anticoagulation Clinic Staff

## 2013-08-10 ENCOUNTER — Telehealth: Payer: Self-pay | Admitting: Internal Medicine

## 2013-08-30 ENCOUNTER — Ambulatory Visit: Payer: Self-pay | Admitting: Pharmacotherapy

## 2013-08-30 ENCOUNTER — Ambulatory Visit: Payer: Medicare Other | Attending: Internal Medicine

## 2013-08-30 DIAGNOSIS — I2699 Other pulmonary embolism without acute cor pulmonale: Secondary | ICD-10-CM | POA: Insufficient documentation

## 2013-08-30 DIAGNOSIS — E559 Vitamin D deficiency, unspecified: Secondary | ICD-10-CM | POA: Insufficient documentation

## 2013-08-30 DIAGNOSIS — I4891 Unspecified atrial fibrillation: Secondary | ICD-10-CM | POA: Insufficient documentation

## 2013-09-01 ENCOUNTER — Telehealth: Payer: Self-pay | Admitting: Internal Medicine

## 2013-09-01 NOTE — Telephone Encounter (Signed)
Patient has been informed.

## 2013-09-01 NOTE — Telephone Encounter (Signed)
Left message for patient to return call.    Thank you,  Tammy Falwell, ma

## 2013-09-01 NOTE — Telephone Encounter (Signed)
Let her know vit b12 and vit D  improved ,continue Vit b12 supplements   She can take 1000 unit vit d daily .

## 2013-09-01 NOTE — Telephone Encounter (Signed)
She is calling for the results of lab test done at [facility] yesterday.   She would like advice and a phone call back.

## 2013-09-27 ENCOUNTER — Ambulatory Visit: Payer: Medicare Other | Attending: General Practice

## 2013-09-27 ENCOUNTER — Ambulatory Visit: Payer: Self-pay | Admitting: Pharmacotherapy

## 2013-09-27 ENCOUNTER — Other Ambulatory Visit: Payer: Self-pay | Admitting: Pharmacotherapy

## 2013-09-27 DIAGNOSIS — I2699 Other pulmonary embolism without acute cor pulmonale: Secondary | ICD-10-CM | POA: Insufficient documentation

## 2013-09-27 DIAGNOSIS — I4891 Unspecified atrial fibrillation: Secondary | ICD-10-CM | POA: Insufficient documentation

## 2013-10-17 ENCOUNTER — Telehealth: Payer: Self-pay | Admitting: Internal Medicine

## 2013-10-17 NOTE — Telephone Encounter (Signed)
Patient has been informed    Tammy [name]  [facility]   Professional Dr[facility]   Internal Medicine

## 2013-10-17 NOTE — Telephone Encounter (Signed)
Patient states that she is taking vitamin d 1000 units and vitamin b . Patient asking if she needs to continue and at what amount.    Tammy [name]   Professional Dr.   Internal Medicine

## 2013-10-17 NOTE — Telephone Encounter (Signed)
She can continue vit d 1000 units daily and vib12 100 mcg daily

## 2013-10-24 ENCOUNTER — Ambulatory Visit: Payer: Self-pay

## 2013-10-24 ENCOUNTER — Ambulatory Visit: Payer: Medicare Other | Attending: General Practice

## 2013-10-24 DIAGNOSIS — I4891 Unspecified atrial fibrillation: Principal | ICD-10-CM | POA: Insufficient documentation

## 2013-10-24 DIAGNOSIS — I2699 Other pulmonary embolism without acute cor pulmonale: Secondary | ICD-10-CM | POA: Insufficient documentation

## 2013-10-24 LAB — INR: INR: 1.91 — AB (ref 0.87–1.18)

## 2013-10-31 ENCOUNTER — Other Ambulatory Visit: Payer: Self-pay

## 2013-11-14 ENCOUNTER — Ambulatory Visit: Payer: Self-pay | Admitting: Pharmacist

## 2013-11-14 ENCOUNTER — Ambulatory Visit: Payer: Medicare Other | Attending: General Practice

## 2013-11-14 DIAGNOSIS — I2699 Other pulmonary embolism without acute cor pulmonale: Secondary | ICD-10-CM | POA: Insufficient documentation

## 2013-11-14 DIAGNOSIS — I4891 Unspecified atrial fibrillation: Principal | ICD-10-CM | POA: Insufficient documentation

## 2013-11-14 LAB — INR: INR: 2.96 — AB (ref 0.87–1.18)

## 2013-11-21 ENCOUNTER — Encounter: Payer: Self-pay | Admitting: Internal Medicine

## 2013-11-28 ENCOUNTER — Other Ambulatory Visit: Payer: Self-pay

## 2013-11-29 ENCOUNTER — Encounter: Payer: Self-pay | Admitting: Cardiovascular Disease

## 2013-11-29 ENCOUNTER — Ambulatory Visit: Payer: Medicare Other | Admitting: Cardiovascular Disease

## 2013-11-29 VITALS — BP 120/70 | HR 65 | Wt 182.0 lb

## 2013-11-29 VITALS — BP 120/70 | HR 65 | Ht 65.98 in | Wt 182.0 lb

## 2013-11-29 DIAGNOSIS — I2699 Other pulmonary embolism without acute cor pulmonale: Principal | ICD-10-CM

## 2013-11-29 DIAGNOSIS — I4891 Unspecified atrial fibrillation: Secondary | ICD-10-CM

## 2013-11-29 DIAGNOSIS — E039 Hypothyroidism, unspecified: Secondary | ICD-10-CM

## 2013-11-29 DIAGNOSIS — I480 Paroxysmal atrial fibrillation: Secondary | ICD-10-CM

## 2013-11-29 NOTE — Nursing Note (Signed)
>>   Tammy CleverlyLEANN [name]     Wed Nov 29, 2013 11:57 AM  Pre op exam for cataract surgery with Dr. Janey GreaserForeman  EKG done  Patient ID verified by birthdate and last name. Pt has been asked if they are taking any OTC, herbal medications or supplements. Pt denies being hospitalized since their last visit.   Tammy CleverlyLeAnn Leger MA

## 2013-11-29 NOTE — Progress Notes (Signed)
83yr female  With overweight , dyslipidemia, past PE with a fib episode back in 2010.  Initialy was seen by Dr. Daine Gravel in our office, and also saw Dr. Theda Belfast in hematology clinic regarding the DVT in2010 and 2011,and then saw me in 2012 and Dr. Earline Mayotte in 2013/2014 in this cardiology clinic.    Last visit: 05/03/2013 with Dr. Earline Mayotte    Patient is here today because she is PREOP for cataract surgery with Dr. Janey Greaser.  ECG was done today shows normal sinus rhythm.  She has no s/s of angina such as chest pain/pressure, nor any known h/o CAD or cardiomyopathy.    PE: the patient had a PE back in 2010. It was found after a 1.5 hrs airplane flight, but she says that she definitely had symptoms of moderate chest discomfort for several days before the plane flight, and thinks she had the PE for a while before it was diagnosed. Dr. Marigene Ehlers previous clinic note says it was a right lower lobe PE. She also had atrial fibrillatino dx'ed at the same time.  Per Dr Marigene Ehlers initial clinic note in 2010 "On April 04, 2009, patient was lifting some heavy boxes and experienced right-sided chest pain with shortness of breath. When she pressed on it, it felt better. Patient thought it was musculoskeletal in component. On April 05, 2009, patient noted increase in chest pain symptoms and shortness of breath. Patient traveled to [facility] and hospitalized at [facility] after evaluation showed patient to be in atrial fibrillation with rapid ventricular response and CT of the chest showed pulmonary embolism the medial base of the right lower lobe segment, pulmonary artery, right lower lobe atelectasis, and old infiltrate and right pleural effusion. Patient was converted to sinus rhythm with intravenous Cardizem and patient improved. Patient's assessment had [facility] in [place], [place], by the admitting physician was that the pulmonary emboli most  likely was secondary to possible recent flight versus underlying genetic disorder, underlying malignancy. Patient was started on subcu Lovenox and warfarin. "    Since that time, patient has been continued on warfarin anticoagulation. She has no known recurrences of DVT or PE's. She was actually seen by Dr. Cliffton Asters in anticoagulation clinic/heme clinic in 2010 and 2011 and he noted that from hematology perspective she didn't have an indication for permanent anticoagulation, but she was never taken off anticoagulatino, probably due to concern she had a fib dx as well.    Atrial fibrillation: The patient has one lifetime known episode of atrial fibrillation, in 2010, at the same time she had a dx of a PE. Since that time, she has no known a fib recurrences documented.  She does not have sensation of fast or irregular heart rhythms.    Dyslipidemia: she is overweight with a BMI of 29 and has some dyslipidemia, which is controlled with simvastatin (LDL <100). She has no s/s of myalgias and/or myopathy. She says that she knows she needs to lose weight, she had lost some last year but then gained it back over the holidays.    Previous cardiac testing:  Most recent echocardiogram: stress echo done inour office in2012, supervised by Dr Nedra Hai  "Baseline Echocardiography:  2D & M mode echocardiography:  Morphologically normal aortic, mitral and tricuspid valves;  Normal cardiac chamber size;  Normal left ventricular wall thickness;  Normal left ventricular wall motion; (EF was 71%).  Color flow mapping and doppler study:  Mild pulmonic regurgitation;  Mild tricuspid  regurgitation;  RV systolic pressure was estimated to be 33 mmHg.  Treadmill exercise test:  Patient was exercised for 6.55 minutes on the Bruce exercise protocol, achieving a maxinum heart rate of 144 bpm, which was 94 % of the aged -predicted maxinum heart rate. Test was terminated due to Patient fatigue. Patient had No chest discomfort.   Baseline EKG:   Rhythm: Sinus rhythm ; Rate: 82 bpm; PR: 0.16 sec,; QRS: 0.08 sec.; QT : 0.40 sec.; Within normal limits   BP 100 / 70 mmHg;  There was one supraventricular premature beat prior to exercise test on monitor  Exercise EKG: showed No ST segment changes Diagnostic for myocardial ischemia. ;   BP rose to 130/ 60 mmHg;  There were One isolated ventricular premature beats During exercise ;  Stress Echocardiogram: showed No ischemic segmental wall motion abnormality;  Diagnosis:  Exercise EKG showed No ischemic ST abnormality;  Stress Echocardiogram showed No ischemic segmental wall motion abnormality.  Patient had No chest pain with exercise."    She also had one, per her records, [facility] in [facility] when her PE was originally diagnosed  "Echocardiogram was performed on April 06, 2009, which showed normal left ventricular size and LVEF of 63%. There was pseudo normal pattern with diastolic filling, normal right ventricular size, and normal RV systolic function, normal cardiac valves and no pericardial effusion. "    She thought she had a Holter monitor in the past at some point but I can't find records of one in our system./[place] chart    ROS:   General: denies recent ER visit, acute illness or hospital stay in recent months  HEENT:   Cardiac: no palpitations/sensation of heart racing or beating fast. No chest pain any time since the PE in 2010, during which she did have chest pain fora  Few days before her diagnosis.  Hem/onc: has h/o a PE in 2010 (see above), without any recurrence since. No known clinical / manifest DVT before she had the PE.   Orthopedic: no h/o orthopedic surgeries  Neuro: no past known strokes or TIA's, no focal weakness speech of vision changes.  Endocrine: has overweight but no recent major weight gain or weight loss. Has hypothyroidism but tx with levothyroxine.    Patient Active Problem List   Diagnosis    Hypothyroidism    left supraclavicular fullness     PURE HYPERCHOLESTEROLEMIA    PE (pulmonary embolism)    Atrial fibrillation - 1 known episode, in 2010, in setting of a PE    Vitamin D deficiency   overweight    Past Surgical History   Procedure Laterality Date    No surgical history      Colonoscopy       declined in past       History     Social History    Marital Status: MARRIED     Spouse Name: N/A     Number of Children: 2    Years of Education: N/A     Occupational History    used to work with [employer]      Social History Main Topics    Smoking status: Never Smoker     Smokeless tobacco: Never Used    Alcohol Use: No    Drug Use: No   Her weight: she purposefully lose some weight last year, but then gained some of it back during the holiday time/Xmas      Family History   Problem Relation  Age of Onset    Cancer Sister      oral    Heart Sister     Heart Mother    no change/update to family history since last visit    Current Outpatient Prescriptions   Medication Sig Dispense Refill    LevoTHYROxine (SYNTHROID) 112 mcg Tablet Take 1 tablet by mouth every day. (thyroid)  90 tablet  3    Simvastatin (ZOCOR) 40 mg Tablet Take 1 tablet by mouth every day at bedtime. (cholesterol)  90 tablet  3     No current facility-administered medications for this visit.   warfarin, as doses by anticoagulation clinic    O:   Filed Vitals:    11/29/13 1059   BP: 120/70   Pulse: 65   Height: 1.676 m (5' 5.98")   Weight: 82.555 kg (182 lb)   SpO2: 98%   BMI 29.5  General: Patient awake, alert, sitting up in chair, no apparent distress  HEENT: NCAT.   Eyes: EOM grossly intact. Anicteric, noninjected sclerae  Neck: supple, no masses  Cardiac: heart regular rhythm, normal rate       Heart sounds: normal S1 and s2, no s3 or s4, no ectopic beats heard       Murmurs: none  Pulmonary: Lungs CTA bilaterally  Abdomen:overweight,  soft, NTND, +normoactive bowel sounds  Extremities: overweight but with no signfiicant LE edema. No clubbing or cyanosis.   Vascular: 2+ radial and DP pulses bilaterally    Lab Results   Lab Name Value Date/Time    LDLC 89 03/01/2013  8:02 AM    LDLC 102 08/31/2012  6:54 AM    HDL 65 03/01/2013  8:02 AM    HDL 59 08/31/2012  6:54 AM    TRIG 125 03/01/2013  8:02 AM    TRIG 123 08/31/2012  6:54 AM    CHOL 179 03/01/2013  8:02 AM    CHOL 186 08/31/2012  6:54 AM    AST 24 03/01/2013  8:02 AM    ALT 18 03/01/2013  8:02 AM    NA 140 03/01/2013  8:02 AM    K 3.8 03/01/2013  8:02 AM    BUN 11 03/01/2013  8:02 AM    CR 0.64 03/01/2013  8:02 AM    CR 0.65 08/31/2012  6:54 AM       A/P:  (415.19) PE (pulmonary embolism)  (primary encounter diagnosis)  Comment: PE x one in 2010, it was found after a 1.5 hr plane ride but she says that she had actually had the symptoms for a few days prior (chest pain)  Plan: -patient has been on warfarin anticoagulation for >4 years now. She actually saw Dr. Cliffton Asters @[facility]  hematology clinic in the past and he recommended in 2011 to consider stopping the warfarin , that she didn't have an indication to be on it forever based on one PE episode, unless she needed to stay on it for a fib episodes        -I advised weight loss as one strategy to potentially lower the risk of recurrent thromboembolism, also avoid volume depletion/dehydration    (427.31) Paroxysmal atrial fibrillation  Comment:   Plan: POC ELECTROCARDIOGRAM WITH RHYTHM STRIP, today in clinic show sinus rhythm        -I actually don't think she has an indication for permanent anticoagulatino right now -she had 1 known a fib episode back in 2010 and was in the setting of a PE, and no recurrent a  fib since that time, that we have seen, and no palpitations symptoms. I discussed all this with the patient, and confirmed she has 1 lifetime episode of DVT/PE as well, and that isn't an indication for permanent anticoagulation. I told the patient permanent anticoagulation is not totally benign either - there is a risk  of minor or major bleeding, including hemorrhagic strokes.  If we do stop the warfarin and she has a fib again, her stroke risk of cardioembolic strokes will go up, but given we haven't seen any paroxysmal a fib in 4 years, nor has anyone else, I do not think at this point that her benefit from being anticoagulated is > risk, and in fact I think it's the opposite right now.  She understands and agrees and will stop warfarin for now.    -she is to contact me if she is takign any airplane trips of > a couple of hours, and she will contact me if she has any unexplained LE pain or swelling/edema, or if she gets any chest discomfort like she had in the past when she had her PE    -check 48 hours Holter monitor    Overweight  I recommended weight loss and this may help both her DVT/PE risk and her dyslipidemia    preop exam  Patient is preop for cataract surgery. She is a low risk patient for this procedure and it's a low risk eye procedure.  ECG to be forwarded to Dr Bascom Levels office. No indication for further preop testing, I recommend proceed with eye procedure.      Stephani Police, MD  Attending physician, cardiology  [facility]  [address]  [place], [place] [address]  [phone]

## 2013-11-30 LAB — POC ELECTROCARDIOGRAM WITH RHYTHM STRIP: QTC: 455

## 2013-12-06 ENCOUNTER — Other Ambulatory Visit: Payer: Self-pay

## 2013-12-06 ENCOUNTER — Ambulatory Visit: Payer: Medicare Other

## 2013-12-06 DIAGNOSIS — I4891 Unspecified atrial fibrillation: Secondary | ICD-10-CM

## 2013-12-06 NOTE — Nursing Note (Signed)
>>   Tammy Danielsaitlyn Holmes, MA     Wed Dec 06, 2013  3:03 PM  Patient ID verified by Name & DOB.  Patient here for 48 hour HOLTER MONITOR with Serial No. [id number], start time 13:55 pm.  Patient was given instructions regarding monitor and told to call the office if questions arise, and patient understands the informations given.  Patient is to return 12/08/13  for removal of monitor. Caitlyn Holmes MA II.

## 2013-12-12 ENCOUNTER — Ambulatory Visit
Admission: RE | Admit: 2013-12-12 | Discharge: 2013-12-12 | Disposition: A | Discharge status: Home / Self Care | Payer: Medicare Other | Source: Ambulatory Visit | Attending: Cardiovascular Disease | Admitting: Cardiovascular Disease

## 2013-12-12 DIAGNOSIS — I498 Other specified cardiac arrhythmias: Secondary | ICD-10-CM | POA: Insufficient documentation

## 2013-12-12 DIAGNOSIS — I4949 Other premature depolarization: Principal | ICD-10-CM | POA: Insufficient documentation

## 2013-12-12 DIAGNOSIS — I491 Atrial premature depolarization: Secondary | ICD-10-CM | POA: Insufficient documentation

## 2013-12-12 DIAGNOSIS — I440 Atrioventricular block, first degree: Secondary | ICD-10-CM | POA: Insufficient documentation

## 2013-12-19 ENCOUNTER — Telehealth: Payer: Self-pay | Admitting: Cardiovascular Disease

## 2013-12-19 NOTE — Telephone Encounter (Signed)
Patient calling and would like a phone call back with Holter results. Also wants to make sure we sent them to Dr. Bascom LevelsForeman's office so she can get her cataract surgery.

## 2013-12-19 NOTE — Telephone Encounter (Signed)
The Holter monitor was normal.  I sent report to Dr Mechele ClaudeHundal's office but I don't think it was sent to [facility]. We can send it today.  Not sure why they felt like she needed a Holter to get a cataract surgery -that was not something recommended by me/our office before she gets a cataract surgery.    Also, I just faxed antoher copy of my most recent clinic note to [facility] ,which says that she does not have any contraindication to cataract surgery. I think I already sent them that note but I'm resending it....    Stephani PoliceAlyssa Dorr Perrot, MD  Attending physician, cardiology  [facility]  [address]  [place], [place] [id number]  [phone]

## 2013-12-19 NOTE — Telephone Encounter (Signed)
Spoke to patient ID [id number]. Patient told per Dr. Dahlia ClientBrowning message, Holter was normal.   I faxed report to Dr. Janey GreaserForeman, and Dr. Dahlia ClientBrowning office visit as well.

## 2013-12-21 NOTE — Procedures (Signed)
Holter study for patient Tammy CutterDonna L Desmith was scanned and sent to physician's Cardiofile box (Holter system) for review and final interpretation.

## 2013-12-26 ENCOUNTER — Other Ambulatory Visit: Payer: Self-pay

## 2013-12-27 ENCOUNTER — Ambulatory Visit: Payer: Self-pay | Admitting: Pharmacotherapy

## 2014-01-02 ENCOUNTER — Ambulatory Visit: Payer: Medicare Other | Admitting: Internal Medicine

## 2014-01-30 ENCOUNTER — Other Ambulatory Visit: Payer: Self-pay

## 2014-02-08 ENCOUNTER — Telehealth: Payer: Self-pay | Admitting: Cardiovascular Disease

## 2014-02-08 NOTE — Telephone Encounter (Signed)
Aspirin 81mg /day is reasonable to help reduce risks of heart attack and stroke in people of her age group.  It won't help much to reduce the risk of blood clots in the leg or lungs, however.  Since she has had the problem of having a blood clot once back in 2010, that went to her lung, I suggest to have her take Xarelto, which is a blood thinner that works quickly and then wears off about about 1 day.  I recommend to take it before she goes on the airplane trip. Usually it is taken once/day with the evening meal. We could have her take it the evening before  she is going on an airplane trip (or any long car trips where she is going to be sitting for >3 hours in a car).  I suggest to take one the night before she goes on the airplane trip, and also the evening before she is flying back.  I can give her Xarelto Rx, we could give her a 1 week supply (in case she takes other airplane trips in future, I would suggest to take a dose before she goes on those ).  Other alternative is to take a Lovenox shot before she gets on the airplane, but that is a shot/injection she would have to give herself, and may be similar cost.     [name], MD  Attending physician, cardiology  [facility]  [address]  [place], [place] [id number]  [phone]

## 2014-02-08 NOTE — Telephone Encounter (Signed)
Patient was taken off warfarin a couple of months ago. She seems to remember Dr. Dahlia ClientBrowning telling her to let her know if she plans on flying..the patient is flying down to [place] in a couple of months and she needs to know if she needs to take any medications and also if she should be taking a baby aspirin a day? Patient advised Dr. Dahlia ClientBrowning in tomorrow.

## 2014-02-08 NOTE — Telephone Encounter (Signed)
Spoke to patient ID [id number]. Patient understood per Dr. Dahlia ClientBrowning message. Patient will cal us back once she knows when she will be going on the trip. At that point she will have Dr. Dahlia ClientBrowning send prescription.

## 2014-02-27 ENCOUNTER — Other Ambulatory Visit: Payer: Self-pay

## 2014-03-06 ENCOUNTER — Other Ambulatory Visit: Payer: Self-pay

## 2014-03-06 DIAGNOSIS — Z1231 Encounter for screening mammogram for malignant neoplasm of breast: Secondary | ICD-10-CM

## 2014-03-24 ENCOUNTER — Other Ambulatory Visit: Payer: Self-pay | Admitting: Internal Medicine

## 2014-03-25 ENCOUNTER — Other Ambulatory Visit: Payer: Self-pay | Admitting: Internal Medicine

## 2014-03-27 ENCOUNTER — Other Ambulatory Visit: Payer: Self-pay

## 2014-04-02 ENCOUNTER — Telehealth: Payer: Self-pay | Admitting: Cardiovascular Disease

## 2014-04-02 DIAGNOSIS — Z86718 Personal history of other venous thrombosis and embolism: Principal | ICD-10-CM

## 2014-04-02 MED ORDER — RIVAROXABAN 20 MG TABLET
20.0000 mg | ORAL_TABLET | Freq: Every day | ORAL | Status: DC | PRN
Start: 2014-04-02 — End: 2014-05-08

## 2014-04-02 NOTE — Telephone Encounter (Signed)
Three verifiers were used to identify this patient, patient name, patient DOB, and/or known to me/ address. Tammy CleverlyLeAnn Leger MAll   Patient would like to take the xarelto, please [facility], does have another plane trip in September so a few would be nice.

## 2014-04-02 NOTE — Telephone Encounter (Signed)
Since she had a pulmonary embolus in the past after an airplane flight (though we are not sure if it happened during the flight or maybe was there before the flight),  I recommended if she takes an airplane flight to take Xarelto the evening before the flight (and also take one the evening before she comes back).  This medication lasts about 24 hours.  Usually Xarelto is taken with food, with evening meal.  Let me know which pharmacy she wants me to send it to.  Other alternative is take Lovenox (but that is a shot and have to take it every 12 hours and it tends to be fairly expensive too).  I recommend to give her an Rx for Xarelto (I suggest to give her Rx for 6 tablets). Then if she takes any other flights in the next year or two, or any extended car trips where she will be in the car more than 3-4 hours, to take it.  I think even though it's a short airplane trip I would recommend to take a blood thinner just to be on the safe side.

## 2014-04-02 NOTE — Telephone Encounter (Signed)
Patient was taking off coumadin by Dr Dahlia ClientBrowning, during last visit patient discussed going to [place] to visit her daughter, flight is an hour and 20 min. Dr Dahlia ClientBrowning mentioned she may have to take a pill for flight down and back. Patient doesn't know what medication that would be ad because its a short flight does she need it. Please advise

## 2014-05-01 ENCOUNTER — Ambulatory Visit
Admission: RE | Admit: 2014-05-01 | Discharge: 2014-05-01 | Disposition: A | Discharge status: Home / Self Care | Payer: Medicare Other | Source: Ambulatory Visit

## 2014-05-01 DIAGNOSIS — Z1231 Encounter for screening mammogram for malignant neoplasm of breast: Secondary | ICD-10-CM

## 2014-05-08 ENCOUNTER — Ambulatory Visit (INDEPENDENT_AMBULATORY_CARE_PROVIDER_SITE_OTHER)
Admission: RE | Admit: 2014-05-08 | Discharge: 2014-05-08 | Disposition: A | Discharge status: Home / Self Care | Payer: Medicare Other | Source: Ambulatory Visit | Attending: Internal Medicine | Admitting: Internal Medicine

## 2014-05-08 ENCOUNTER — Other Ambulatory Visit: Payer: Self-pay | Admitting: Internal Medicine

## 2014-05-08 ENCOUNTER — Ambulatory Visit
Admission: RE | Admit: 2014-05-08 | Discharge: 2014-05-08 | Disposition: A | Discharge status: Home / Self Care | Payer: Medicare Other | Source: Ambulatory Visit | Attending: Internal Medicine | Admitting: Internal Medicine

## 2014-05-08 ENCOUNTER — Ambulatory Visit (INDEPENDENT_AMBULATORY_CARE_PROVIDER_SITE_OTHER): Payer: Medicare Other | Admitting: Internal Medicine

## 2014-05-08 ENCOUNTER — Encounter: Payer: Self-pay | Admitting: Internal Medicine

## 2014-05-08 VITALS — BP 112/68 | HR 75 | Ht 66.0 in | Wt 178.4 lb

## 2014-05-08 DIAGNOSIS — M25512 Pain in left shoulder: Principal | ICD-10-CM

## 2014-05-08 DIAGNOSIS — M25569 Pain in unspecified knee: Principal | ICD-10-CM

## 2014-05-08 DIAGNOSIS — Z79899 Other long term (current) drug therapy: Secondary | ICD-10-CM

## 2014-05-08 DIAGNOSIS — E559 Vitamin D deficiency, unspecified: Secondary | ICD-10-CM

## 2014-05-08 DIAGNOSIS — E039 Hypothyroidism, unspecified: Secondary | ICD-10-CM

## 2014-05-08 DIAGNOSIS — M25519 Pain in unspecified shoulder: Secondary | ICD-10-CM

## 2014-05-08 NOTE — Nursing Note (Signed)
>>   Tammy Drema PryPopal, MA     Tue May 08, 2014 10:00 AM  Patient identification verified by last name and date of birth.  Here with both knee pain and right arm pain post fall.  Since you [facility], have you been seen by any other physicians or surgeons, gone to the emergency room/urgent care or been hospitalized?  no    Explanation: none  Have you started any over-the-counter medications, herbal supplements, vitamins or home remedies?  no    Explanation: none  Tammy Popal, MA

## 2014-05-08 NOTE — Progress Notes (Signed)
Chief Complaint   Patient presents with    Knee Problem     both knee    Arm Problem     right     Chief Complaint   Patient presents with    Knee Problem     both knee    Arm Problem     right         Tammy [name] is a(n) [age]yr old female who presents for symptoms of  right knee pain ongoing for the last one year .  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  The pain is progressively getting worse    The pain began 1 year(s) ago, the onset of pain was gradual, not related to any specific activity and has been gradually worsening.  Pt rates the pain as 8 out of 10 over the weekend because of walking , and describes pain as dull and sharp.  Pain is located at the posterior and anterior knee.  Pain is associated with weakness  and stiffness in knee   Got worse Walking at the airport was visiting her daughter    Pain occurs with walking.  Symptoms are aggravated by walking and climbing stairs, and diminished by rest, ice and heat.    She has not had similar symptoms in the past, and has not had a history of trauma.  She also has Left shoulder pain ongoing 1 year but gradually getting worse, it is hard for her to wear her clothes , pain with overhead movements of the arm    ROS:  complete review of symptons was done all other systems are negative except as otherwise stated in HPI    Vs  BP 112/68   Pulse 75   Ht 1.676 m (5\' 6" )   Wt 80.922 kg (178 lb 6.4 oz)   BMI 28.81 kg/m2   SpO2 97%        Objective:    General Appearance: healthy, alert, no distress, pleasant affect, cooperative.    Right Knee Exam:  Inspection:no effusion.  Palpation: pain with patellar compression.  Passive ROM: limited flexion and extension.  Gait:antalgic   Musculoskeletal: shoulder: reduced range of motion of left shoulder , no swelling   No swelling       ASSESMENT AND PLAN     (719.46) Knee pain  (primary encounter diagnosis)  Comment:arthrites   Plan: PHYSICAL THERAPY REFERRAL, SHOULDER 2+ VIEWS,         LEFT, CANCELED: KNEE 1 OR 2  VIEWS, LEFT + KNEE         1 OR 2 VIEWS, RIGHT            (719.41) Left shoulder pain  Comment: arthr ites   Plan: SHOULDER 2+ VIEWS, LEFT        Start physical therapy    (268.9) Vitamin D deficiency  Comment:   Plan: on treatment     (244.9) Hypothyroidism  Comment:   Plan: THYROID STIMULATING HORMONE, VITAMIN D, 25         HYDROXY            (V58.69) Encounter for long-term (current) use of medications  Comment:   Plan: LIPID PANEL, HEPATIC FUNCTION PANEL, BASIC         METABOLIC PANEL, CBC WITH DIFFERENTIAL, VITAMIN        B12             I did review patients past medical and family/social history, no  changes noted.  Barriers to Learning assessed: none. Patient verbalizes understanding of teaching and instructions.  Harvie Heck MD      Electronically Signed By:  Harvie Heck, MD  Physician associate   [place]  [phone]

## 2014-05-09 ENCOUNTER — Ambulatory Visit: Payer: Medicare Other | Attending: Internal Medicine

## 2014-05-09 DIAGNOSIS — Z79899 Other long term (current) drug therapy: Principal | ICD-10-CM | POA: Insufficient documentation

## 2014-05-09 DIAGNOSIS — E039 Hypothyroidism, unspecified: Secondary | ICD-10-CM | POA: Insufficient documentation

## 2014-05-09 LAB — HEPATIC FUNCTION PANEL
ALANINE TRANSFERASE (ALT): 18 U/L (ref 5–54)
ALBUMIN: 3.7 g/dL (ref 3.2–4.6)
ALKALINE PHOSPHATASE (ALP): 44 U/L (ref 35–115)
ASPARTATE TRANSAMINASE (AST): 25 U/L (ref 15–43)
BILIRUBIN DIRECT: 0.1 mg/dL (ref 0.0–0.2)
BILIRUBIN TOTAL: 0.7 mg/dL (ref 0.3–1.3)
PROTEIN: 6.5 g/dL (ref 6.3–8.3)

## 2014-05-09 LAB — BASIC METABOLIC PANEL
CALCIUM: 8.7 mg/dL (ref 8.6–10.5)
CARBON DIOXIDE TOTAL: 27 meq/L (ref 24–32)
CHLORIDE: 106 meq/L (ref 95–110)
CREATININE BLOOD: 0.69 mg/dL (ref 0.44–1.27)
GLUCOSE: 96 mg/dL (ref 70–99)
POTASSIUM: 3.9 meq/L (ref 3.3–5.0)
SODIUM: 139 meq/L (ref 135–145)
UREA NITROGEN, BLOOD (BUN): 15 mg/dL (ref 8–22)

## 2014-05-09 LAB — CBC WITH DIFFERENTIAL
BASOPHILS % AUTO: 0.4 %
BASOPHILS ABS AUTO: 0 10*3/uL (ref 0–0.2)
EOSINOPHIL % AUTO: 2.6 %
EOSINOPHIL ABS AUTO: 0.2 10*3/uL (ref 0–0.5)
HEMATOCRIT: 42.1 % (ref 36–46)
HEMOGLOBIN: 13.8 g/dL (ref 12.0–16.0)
LYMPHOCYTE ABS AUTO: 2.1 10*3/uL (ref 1.0–4.8)
LYMPHOCYTES % AUTO: 29 %
MCH: 30.5 pg (ref 27–33)
MCHC: 32.8 % (ref 32–36)
MCV: 92.9 UM3 (ref 80–100)
MONOCYTES % AUTO: 7 %
MONOCYTES ABS AUTO: 0.5 10*3/uL (ref 0.1–0.8)
MPV: 9 UM3 (ref 6.8–10.0)
NEUTROPHIL ABS AUTO: 4.5 10*3/uL (ref 1.80–7.70)
NEUTROPHILS % AUTO: 61 %
PLATELET COUNT: 302 10*3/uL (ref 130–400)
RDW: 13.9 U (ref 0–14.7)
RED CELL COUNT: 4.53 10*6/uL (ref 4.0–5.2)
WHITE BLOOD CELL COUNT: 7.3 10*3/uL (ref 4.5–11.0)

## 2014-05-09 LAB — LIPID PANEL
CHOLESTEROL: 188 mg/dL (ref 0–200)
HDL CHOLESTEROL: 59 mg/dL (ref >=35)
LDL CHOLESTEROL CALCULATION: 107 mg/dL (ref <130)
NON-HDL CHOLESTEROL: 129 mg/dL (ref 0–150)
TOTAL CHOLESTEROL:HDL RATIO: 3.2 (ref <4.0)
TRIGLYCERIDE: 108 mg/dL (ref 35–160)

## 2014-05-09 LAB — VITAMIN B12: VITAMIN B12: 465 pg/mL (ref 213–816)

## 2014-05-09 LAB — VITAMIN D, 25 HYDROXY: VITAMIN D, 25 HYDROXY: 32.9 ng/mL (ref 30.0–100.0)

## 2014-05-09 LAB — THYROID STIMULATING HORMONE: THYROID STIMULATING HORMONE: 1.89 u[IU]/mL (ref 0.35–3.30)

## 2014-05-22 ENCOUNTER — Telehealth: Payer: Self-pay | Admitting: Cardiovascular Disease

## 2014-05-22 NOTE — Telephone Encounter (Signed)
I called Tammy [name] and scheduled her next appointment with Dr. Earline Mayottees [name]. Patient wanted Dr. Earline Mayottees [name] to know that [place] [place] and took (exeltro ?) that you had suggested. She did just fine with the flight and trip. She is planning another trip in September and will do the same thing. She just wanted you to know that all went well, she will see you in February 2016.

## 2014-05-28 NOTE — Progress Notes (Signed)
I am pleased.  Thanks,   Electronically Signed By:    Chrystine OilerPatrice L. Earline Mayottees Pois, MD  Attending Physician  [facility]  Cardiology  [address], [place], [place]  [id number]  [phone] - office

## 2014-05-31 ENCOUNTER — Telehealth: Payer: Self-pay | Admitting: Internal Medicine

## 2014-05-31 NOTE — Telephone Encounter (Signed)
Fax received on 05/31/2014 [facility] regarding knee/shoulder requesting physician action: review, sign and date . The form(s) have been placed in the physicians in-box to process.

## 2014-06-19 ENCOUNTER — Ambulatory Visit: Payer: Medicare Other | Admitting: Internal Medicine

## 2014-06-19 VITALS — BP 102/60 | HR 72 | Wt 177.6 lb

## 2014-06-19 DIAGNOSIS — M25512 Pain in left shoulder: Secondary | ICD-10-CM

## 2014-06-19 DIAGNOSIS — M25519 Pain in unspecified shoulder: Secondary | ICD-10-CM

## 2014-06-19 DIAGNOSIS — M25569 Pain in unspecified knee: Secondary | ICD-10-CM

## 2014-06-19 DIAGNOSIS — Z8739 Personal history of other diseases of the musculoskeletal system and connective tissue: Secondary | ICD-10-CM

## 2014-06-19 DIAGNOSIS — M25561 Pain in right knee: Principal | ICD-10-CM

## 2014-06-19 DIAGNOSIS — H9319 Tinnitus, unspecified ear: Secondary | ICD-10-CM

## 2014-06-19 DIAGNOSIS — Z23 Encounter for immunization: Secondary | ICD-10-CM

## 2014-06-19 NOTE — Progress Notes (Signed)
Chief Complaint   Patient presents with    Knee Pain      Tammy [name] presents with multiple medical concerns    She continues to have right knee pain for last  1 year(s) ago, the onset of pain was gradual, not related to any specific activity and has been gradually worsening. Pt rates the pain as 8 out of 10 over the weekend because of walking , and describes pain as dull and sharp. Pain is located at the posterior and anterior knee. Pain is associated with weakness and stiffness in knee   Pain occurs with walking.  Symptoms are aggravated by walking and climbing stairs, and diminished by rest, ice and heat.  It does get better with exercises but she has to be really careful in doing things, if turns suddenly it hurts , does do  icing   Her x ray knee reviewed   Chondrocalcinosis and arthrosis bilateral knees. Findings suggestive of  higher grade chondromalacia of the right patella. These findings together  are suggestive of CPPD arthropathy.   She does have chronic Ringing in ears no hearing loss     She also has Left shoulder pain ongoing 1 year but gradually getting worse, it is hard for her to wear her clothes , pain with overhead movements of the arm  She did go for physical therapy     Ros  complete review of symptons was done all other systems are negative except as otherwise stated in HPI    Vs  BP 102/60 mmHg   Pulse 72   Wt 80.559 kg (177 lb 9.6 oz)   SpO2 97%      Physical exam -  General Appearance: healthy, alert, no distress, pleasant affect, cooperative.  Eyes:  conjunctivae and corneas clear. PERRL, EOM's intact. sclerae normal.  Ears:  external inspection of ears show no abnormality.  Extremities:  no cyanosis, clubbing, or edema.    ASSESMENT AND PLAN     (719.46) Right knee pain  (primary encounter diagnosis)  Comment:Arthrites   Plan: SPORTS MEDICINE CLINIC REFERRAL            (V13.4) Personal history of calcium pyrophosphate deposition disease (CPPD)  Comment:   Plan: will benefit from  cortisone shot     (719.41) Left shoulder pain  Comment: mild arthrites  Plan: SPORTS MEDICINE CLINIC REFERRAL            (V03.82) Need for pneumococcal vaccination  Comment:   Plan: PNEUMOCOCCAL CONJ VACCINE 13 VALENT IM            (388.30) Tinnitus  Comment:   Plan: supportive treatment      I did review patients past medical and family/social history, no changes noted.  Barriers to Learning assessed: none. Patient verbalizes understanding of teaching and instructions.  Zettie Cooley MD      Electronically Signed By:  Zettie Cooley, MD  Physician associate   [facility]  [phone]

## 2014-06-19 NOTE — Nursing Note (Signed)
Patient identification verified by last name and date of birth.  Here for knee pain.  Since you last saw your [facility] provider, have you been seen by any other physicians or surgeons, gone to the emergency room/urgent care or been hospitalized?  no    Explanation: none  Have you started any over-the-counter medications, herbal supplements, vitamins or home remedies?  yes    Explanation: Arthritis pain medication.  Joh Rao Drema Pry, MA

## 2014-06-19 NOTE — Nursing Note (Signed)
Please see immunizations for more information on today's injection. Appropriate VIS given to patient.  Jerzi Tigert, MA I

## 2014-07-03 ENCOUNTER — Ambulatory Visit: Payer: Medicare Other

## 2014-07-03 DIAGNOSIS — Z23 Encounter for immunization: Principal | ICD-10-CM

## 2014-07-03 NOTE — Nursing Note (Signed)
The Influenza Vaccine VIS document for the flu injection was given to patient to review. Patient or person named in permission has answered no to any history of egg allergy, previous serious reaction to a influenza vaccine or current illness which would preclude them receiving an immunization. Any questions were referred to the physician. The Influenza Vaccine was then administered per protocol. The patient was observed for immediate reactions to the vaccine per protocol. None were observed.   Shannie Kontos Oates, LVN   Pt verified x2

## 2014-07-17 ENCOUNTER — Encounter: Payer: Self-pay | Admitting: Orthopaedic Surgery

## 2014-07-17 ENCOUNTER — Ambulatory Visit: Payer: Medicare Other | Attending: Internal Medicine | Admitting: Orthopaedic Surgery

## 2014-07-17 NOTE — Nursing Note (Signed)
Patient identification verified by last name and date of birth.      Lupita LeashDonna is complaining of right knee pain which she rates 5/10 and left shoulder pain which is also 5/10.  Wishes to discuss injections for relief.    Since you [facility] saw your [facility] provider, have you been seen by any other physicians or surgeons, gone to the emergency room/urgent care or been hospitalized?  no    Explanation: n/a  Have you started any over-the-counter medications, herbal supplements, vitamins or home remedies? Yes   Explanation: Ibuprofen arthritis pain relief  D. Dacia Capers, Sr. L.V.N.

## 2014-07-17 NOTE — Progress Notes (Signed)
07/17/2014  Orthopaedic Clinic History and Physical    Dear Dr. Matilde Haymaker, MD:    HPI: I had the pleasure of seeing your patient, Tammy [name], in clinic today in consultation for right knee pain.  As you know, Tammy [name] is a 67yr RHD retired female with right knee pain located posterior.  Onset: about 1 year ago, worse with unknown and is relieved with ice and heat. Pain quality:aching, sharp, throbbing. Strength or sensation changes:none.  Patient does not have symptoms of catching, clicking, locking, or popping. No buckling, but knee feels unstable.  Patient has pain with stairs and sitting for prolonged periods of time and getting in and out of car.  Medications used for this pain includes tylenol and ibuprofen with some benefit.  Previous treatments have included no injections, + therapy and ice.  Patient's current activity level consists of ADL's. Knee pain has improved. Pain 5/10. + swelling.     Also having complaints of left shoulder pain. Right hand dominant. Left shoulder pain located superior and radiates to elbow.  Patient does not recall trauma.  Onset: over 1 year, worse with movement and grabbing and is relieved with avoiding painful movement. Quality of pain:stabbing and sharp and ranks it 5/10.  Patient is unable to sleep at night.  Patient has difficulty with overhead activities.  Strength/ sensory changes:weakness. Medications used for this pain includes ibuprofen with some benefit.  Previous treatments have included no injections, +physical therapy, + heat, + ice.    Patient Active Problem List   Diagnosis    Hypothyroidism    ROUTINE MEDICAL EXAM    OTHER MALAISE AND FATIGUE    left supraclavicular fullness    LOSS OF WEIGHT    PURE HYPERCHOLESTEROLEMIA    PE (pulmonary embolism)    Atrial fibrillation    Long term current use of anticoagulant therapy    PE (physical exam)    BMI 28.0-28.9,adult    Vitamin D deficiency       Past Surgical History    Procedure Laterality Date    No surgical history      Colonoscopy       declined in past       Allergies   Allergen Reactions    Enoxaparin Rash     When she had PE, diffuse rash, biopsied         Current Outpatient Prescriptions on File Prior to Visit   Medication Sig Dispense Refill    LevoTHYROxine (SYNTHROID) 112 mcg Tablet Take 1 tablet by mouth every morning before a meal. 90 tablet 3    Simvastatin (ZOCOR) 40 mg Tablet TAKE 1 TABLET EVERY DAY AT BEDTIME (FOR CHOLESTEROL) 90 tablet 3     No current facility-administered medications on file prior to visit.       History     Social History    Marital Status: MARRIED     Spouse Name: N/A     Number of Children: 2    Years of Education: N/A     Occupational History    used to work with animals      Social History Main Topics    Smoking status: Never Smoker     Smokeless tobacco: Never Used    Alcohol Use: No    Drug Use: No    Sexual Activity: Not on file     Other Topics Concern    Not on file     Social History Narrative  Exam:  Temp(Src) 37.3 C (99.1 F) (Tympanic)  There is no weight on file to calculate BMI.    Appearance: in no apparent distress, alert, oriented times 3, well groomed and dressed and cooperative, mood appropriate, responds appropriately to spoken word with non labored breathing.    HEENT: NC/AT  CV: pulses intact  Resp: unlabored breathing  Skin: no excoriations, lacerations, or rashes  Gait: heel toe pattern, normal foot angle progression.    Msk:   right Knee exam  Upon inspection, there is no obvious deformity or ecchymosis.  ROM: 0 - 100  Patellofemoral crepitus +  Effusion mild  Medial joint line tenderness + Lateral joint line tenderness -  McMurray's +  Thessaly + Bounce +  Motor in LE: 5/5 EHL/TA/GS/Quad/Hamstring  Normal sensation L2- S1 distribution  DP 2+    left shoulder exam demonstrates no obvious deformity or ecchymosis  Inspection of scapula/ shoulder region reveals no atrophy    SC, clavicle, AC joint are NTTP  L shoulder ROM   140Fwd Flexion        120Abduction   70ER  L2IR  -Abdominal compression  5/5Thumbs down abduction at 90 degrees  5/5 At 30 degrees abduction  5/5 ER at neutral position  Hawkin's test +  Neer's test +  Speed's test - Yergason's test -  5/5 strength EPL, Wrist flexor, wrist extensor, bicep, tricep, deltoid  Normal sensation axillary, median, radial, ulnar nerve distributions.  Radial pulse 2+ .    Imaging: Radiographs obtained and radiology report that I independently reviewed of right knee demonstrate knee osteoarthritis with chondrocalcinosis and of left shoulder demonstrate mild AC joint arthrosis    A: right knee osteoarthritis with possible medial meniscus tear and left shoulder subacromial impingement    P:  Given the patient's history, physical exam, and radiographs, the patient has findings consistent with osteoarthritis of the right knee.  I educated the patient in regards to the pathophysiology and progression of arthritis.  We discussed conservative treatment options to include activity modification, weight loss, icing, oral antiinflammatory medication, and therapy.  The patient also understands that if oral antiinflammatory medication does not offer substantial relief, they may undergo injectible medication to include viscosupplementation or steroid, depending on the severity of their arthritis.  We also discussed possible MRI of knee to evaluate for meniscus tear, but as she is not interested in any surgical management, we will hold off on this now and therefore I have offered the patient a steroid injection. Under sterile preparation, I injected the right Knee with 5cc of 1% lidocaine, 2cc of 0.2% ropivicaine, and 1 cc of 40mg /ml kenalog.  The patient tolerated the procedure well and was advised of a flare reaction.  The patient is to limit her activity tonight and gradually increase as tolerated.  Also based on the patient's  history, clinical exam, and radiographs, the patient has findings consistent with left rotator cuff tendonitis/external impingement.  I spoke to the patient at length in regards to the pathophysiology of tendonitis. I have offered the patient a steroid injection.  Under sterile preparation, I injected 5cc of 1% lidocaine and 1ml of 40mg /ml kenalog into the left shoulder subacromial space.  The patient tolerated the procedure well, there were no complications. They were advised to watch for a flare reaction. Continue her exercises. The patient will return prn for repeat evaluation if her pain returns.  All questions were satisfactorily answered prior the patient leaving clinic today.    Thank you for  this consultation.    Electronically signed by:  Luvenia Heller, MD  Orthopaedic Surgery

## 2014-07-20 NOTE — Telephone Encounter (Signed)
Attempted call to [facility] at [phone] to request a copy of report

## 2014-07-25 NOTE — Telephone Encounter (Signed)
Report requested , times 3 - nothing received to date  No report scanned for this message   No further follow up needed for this message.

## 2014-09-18 HISTORY — PX: CYSTECTOMY: SUR359

## 2014-11-29 ENCOUNTER — Encounter: Payer: Self-pay | Admitting: Cardiovascular Disease

## 2014-11-29 ENCOUNTER — Ambulatory Visit: Payer: Medicare Other | Admitting: Cardiovascular Disease

## 2014-11-29 VITALS — BP 108/68 | HR 70 | Resp 14 | Wt 182.4 lb

## 2014-11-29 DIAGNOSIS — E663 Overweight: Secondary | ICD-10-CM

## 2014-11-29 DIAGNOSIS — I4891 Unspecified atrial fibrillation: Secondary | ICD-10-CM

## 2014-11-29 DIAGNOSIS — E785 Hyperlipidemia, unspecified: Secondary | ICD-10-CM

## 2014-11-29 DIAGNOSIS — Z86711 Personal history of pulmonary embolism: Principal | ICD-10-CM

## 2014-11-29 MED ORDER — ATORVASTATIN 40 MG TABLET
40.0000 mg | ORAL_TABLET | Freq: Every day | ORAL | Status: DC
Start: 2014-11-29 — End: 2015-10-01

## 2014-11-29 NOTE — Nursing Note (Signed)
Luciano CutterDonna L Clymer has been identified by name, date of birth and address.Vaccines to be administered have been reviewed by physician and VIS sheets have been given to patient/parent for review for vaccines ordered.This patient is here with self    Since you last saw your [facility] provider, have you gone to the emergency room/urgent care or been hospitalized?  no      Have you started any over-the-counter medications, herbal supplements, vitamins or home remedies?  no      Sarp Vernier Couvrette, MA

## 2014-11-29 NOTE — Patient Instructions (Addendum)
Weight loss is suggested and may decrease the chance of having a pulmonary embolus in the future, and also of arrhythmias/heart rhythm problems as you get older    Aspirin 81mg  a day is recommended to prevent a 1st heart attack or stroke

## 2014-11-29 NOTE — Progress Notes (Signed)
[age]yr female  With overweight , dyslipidemia, past PE with a fib episode back in 2010.  Initialy was seen by Dr. Daine Gravel. [facility] in our office, and also saw Dr. Theda Belfastichard White in hematology clinic regarding the DVT in2010 and 2011,and then saw me in 2012 and Dr. Earline Mayottees Pois in 2013/2014 in this cardiology clinic, and saw me once last year    Last visit: 11/29/2013  Patient is here for routine followup.    PE: the patient had a PE back in 2010. It was found after a 1.5 hrs airplane flight, but she says that she definitely had symptoms of moderate chest discomfort for several days before the plane flight, and thinks she had the PE for a while before it was diagnosed. Dr. Marigene EhlersLee's previous clinic note says it was a right lower lobe PE. She also had atrial fibrillation episode dx'ed at the same time.  Per Dr Marigene EhlersLee's initial clinic note in 2010 "On April 04, 2009, patient was lifting some heavy boxes and experienced right-sided chest pain with shortness of breath. When she pressed on it, it felt better. Patient thought it was musculoskeletal in component. On April 05, 2009, patient noted increase in chest pain symptoms and shortness of breath. Patient traveled to [facility] and hospitalized at [facility] after evaluation showed patient to be in atrial fibrillation with rapid ventricular response and CT of the chest showed pulmonary embolism the medial base of the right lower lobe segment, pulmonary artery, right lower lobe atelectasis, and old infiltrate and right pleural effusion. Patient was converted to sinus rhythm with intravenous Cardizem and patient improved. Patient's assessment had [facility] [place] by the admitting physician was that the pulmonary emboli most likely was secondary to possible recent flight versus underlying genetic disorder, underlying malignancy. Patient was started on subcu Lovenox and warfarin. "    Since that time, patient had been continued on warfarin anticoagulation  until 1 year ago. She has no known recurrences of DVT or PE's. She was actually seen by Dr. Cliffton AstersWhite in anticoagulation clinic/heme clinic in 2010 and 2011 and he noted that from hematology perspective she didn't have an indication for permanent anticoagulation, but she was never taken off anticoagulation after that.    Atrial fibrillation: The patient has one lifetime known episode of atrial fibrillation, in 2010, at the same time she had a dx of a PE. Since that time, she has no known a fib recurrences documented.  She does not have palpitations/sensation of fast or irregular heart rhythms.  ECG today shows sinus rhythm.    Dyslipidemia: she is overweight with a BMI of ~29 and has some dyslipidemia, which is controlled with simvastatin in the past, although her most recent LDL in August 2015 actually was back up to >100mg /dL. She has no s/s of myalgias and/or myopathy. She said at previous visits, and again today, that she knows she needs to lose weight, she had lost some last year but then gained it back over the holidays.    Previous cardiac testing:  Most recent echocardiogram: stress echo done inour office in 2012, supervised by Dr Nedra HaiLee  "Baseline Echocardiography:  2D & M mode echocardiography:  Morphologically normal aortic, mitral and tricuspid valves;  Normal cardiac chamber size;  Normal left ventricular wall thickness;  Normal left ventricular wall motion; (EF was 71%).  Color flow mapping and doppler study:  Mild pulmonic regurgitation;  Mild tricuspid regurgitation;  RV systolic pressure was estimated to be 33 mmHg.  Treadmill exercise test:  Patient was exercised for 6.55 minutes on the Bruce exercise protocol, achieving a maxinum heart rate of 144 bpm, which was 94 % of the aged -predicted maxinum heart rate. Test was terminated due to Patient fatigue. Patient had No chest discomfort.   Baseline EKG:  Rhythm: Sinus rhythm ; Rate: 82 bpm; PR: 0.16 sec,; QRS: 0.08 sec.; QT : 0.40 sec.; Within normal  limits   BP 100 / 70 mmHg;  There was one supraventricular premature beat prior to exercise test on monitor  Exercise EKG: showed No ST segment changes Diagnostic for myocardial ischemia. ;   BP rose to 130/ 60 mmHg;  There were One isolated ventricular premature beats During exercise ;  Stress Echocardiogram: showed No ischemic segmental wall motion abnormality;  Diagnosis:  Exercise EKG showed No ischemic ST abnormality;  Stress Echocardiogram showed No ischemic segmental wall motion abnormality.  Patient had No chest pain with exercise."    She also had one, per her records,  [facility] when her PE was originally diagnosed  "Echocardiogram was performed on April 06, 2009, which showed normal left ventricular size and LVEF of 63%. There was pseudo normal pattern with diastolic filling, normal right ventricular size, and normal RV systolic function, normal cardiac valves and no pericardial effusion. "    She thought she had a Holter monitor in the past at some point but I can't find records of one in our system/[facility] chart    ROS:   General: denies recent ER visit, acute illness or hospital stay in recent months  Cardiac: no palpitations/sensation of heart racing or beating fast. No chest pain any time since the PE in 2010, during which she did have chest pain for a  Few days before her diagnosis. (see above). No recent chest pain/pressure/heaviness  Hem/onc: has h/o a PE in 2010 (see above), without any recurrence since. No known clinical / manifest DVT before she had the PE.   Orthopedic: no h/o orthopedic surgeries  Neuro: no past known strokes or TIA's, no focal weakness speech of vision changes.  Endocrine: has overweight but no recent major weight gain or weight loss. Has hypothyroidism but tx with levothyroxine.    Patient Active Problem List   Diagnosis    Hypothyroidism    left supraclavicular fullness    PURE HYPERCHOLESTEROLEMIA    PE (pulmonary embolism)    Atrial  fibrillation - 1 known episode, in 2010, in setting of a PE    Vitamin D deficiency   overweight    Past Surgical History   Procedure Laterality Date    No surgical history      Colonoscopy       declined in past       History     Social History    Marital Status: MARRIED     Spouse Name: N/A     Number of Children: 2    Years of Education: N/A     Occupational History    used to work with Physicist, medical      Social History Main Topics    Smoking status: Never Smoker     Smokeless tobacco: Never Used    Alcohol Use: No    Drug Use: No   Her weight: she purposefully lose some weight a couple years ago, but then gained some of it back   shx update:  Husband has been drinking 1 bottle of wine per day which is causing her some stress.  She has gained a few lbs over holiday season. Has been walking 30-45 minutes several days a week but admits to diet being too high in carbs and too high portion sizes at times      Family History   Problem Relation Age of Onset    Cancer Sister      oral    Heart Mother     change/update to family history since last visit  -mother who was a smoker, died with a 1st MI at aged 70.  Sister died age 73 with ENT cancer, had EtOH and drug use.    Current Outpatient Prescriptions   Medication Sig Dispense Refill    LevoTHYROxine (SYNTHROID) 112 mcg Tablet Take 1 tablet by mouth every morning before a meal. 90 tablet 3    Simvastatin (ZOCOR) 40 mg Tablet TAKE 1 TABLET EVERY DAY AT BEDTIME (FOR CHOLESTEROL) 90 tablet 3     No current facility-administered medications for this visit.       O:   Filed Vitals:    11/29/14 0837   BP: 108/68   Pulse: 70   Resp: 14   Weight: 82.736 kg (182 lb 6.4 oz)   SpO2: 95%   BMI not calculated today because she was not measured). It was 29 at a clinic visit within past 3 months   General: Patient awake, alert, sitting up in chair, no apparent distress  HEENT: NCAT.   Eyes: EOM grossly intact. Anicteric, noninjected sclerae  Neck: supple, no  masses  Cardiac: heart regular rhythm, normal rate       Heart sounds: normal S1 and s2, no s3 or s4, no ectopic beats heard       Murmurs: none  Pulmonary: Lungs CTA bilaterally  Abdomen:overweight,  soft, NTND, +normoactive bowel sounds  Extremities: overweight but with no signfiicant LE edema. No clubbing or cyanosis.  Vascular: 2+ radial  pulses bilaterally, regular. No R or L carotid bruit    Lab Results   Lab Name Value Date/Time    LDLC 107 05/09/2014 09:44 AM    LDLC 89 03/01/2013 08:02 AM    HDL 59 05/09/2014 09:44 AM    HDL 65 03/01/2013 08:02 AM    TRIG 108 05/09/2014 09:44 AM    TRIG 125 03/01/2013 08:02 AM    CHOL 188 05/09/2014 09:44 AM    CHOL 179 03/01/2013 08:02 AM    AST 25 05/09/2014 09:44 AM    ALT 18 05/09/2014 09:44 AM    NA 139 05/09/2014 09:44 AM    K 3.9 05/09/2014 09:44 AM    BUN 15 05/09/2014 09:44 AM    CR 0.69 05/09/2014 09:44 AM    CR 0.64 03/01/2013 08:02 AM     ECG shows HR 74, NSR with normal axis and intervals, normal ST segments. Low voltage QRS in limb leads, likely secondary to large breast size and some overwieght.    >1/2 the 25 min f/u was spent in direct face to face patient counseling  A/P:   PE (pulmonary embolism)  (primary encounter diagnosis)  Comment: PE x one in 2010, it was found after a 1.5 hr plane ride but she says that she had actually had the symptoms for a few days prior (chest pain)  Plan:   -patient , as of last visit, had been on warfarin anticoagulation for >4 years . She actually saw Dr. Cliffton Asters  hematology clinic in the past and he recommended in 2011 to consider stopping the warfarin ,  that she didn't have an indication to be on it forever based on one PE episode.  At last visit, after discussion of risks/benefits, we decided to d/c her daily warfarin therapy.  I did recommend to take an anticoagulant whenever she has airplane travel at all, or any extended car trips/sitting of 3-4 hours or more.  I rx'ed Xarelto for that purpose        -I advised  weight loss as one strategy to potentially lower the risk of recurrent thromboembolism, also avoid volume depletion/dehydration    Paroxysmal atrial fibrillation  Comment:   Plan: POC ELECTROCARDIOGRAM WITH RHYTHM STRIP, today in clinic show sinus rhythm        -I actually don't think she has a definite indication for permanent anticoagulatino right now -she had 1 known a fib episode back in 2010 and was in the setting of a PE, and no recurrent a fib since that time, that we have seen, and no palpitations symptoms. 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  We discussed a last visit, and again today, risks/benefits of anticoagulation, and also that permanent anticoagulation is not totally benign either - there is a risk of minor or major bleeding, including hemorrhagic strokes. Since she is not on warfarin of other anticoagulants, sif she does have a fib again, her stroke risk of cardioembolic strokes will go up, but given we haven't seen any paroxysmal a fib in 5years, nor has anyone else, I do not think at this point that her benefit from being anticoagulated is definitely > the risks of bleeding. In this situation there is a risk of doing something (anticoagulating and she has higher day to day bleeding risk) and there is a risk of not doing something (she's not currently anticoagulated so her risk of a recurrent DVT or PE is slightly higher and her risk of TIA or stroke if she goes into a fib for days or longer, is going to be higher).   -I recommended if she takes any airplane trips, or car trips of >3-4 hours , she should take Xarelto 20mg  daily during the trip. She took a plane trip a few months ago and did take the Xarelto, without s/s of complications    Dyslipidemia  I notice her LDL is >100 on most recent lipid panel despite simvastatin  Recommend: -weight loss, low simple carbs low fat diet, and try changing simvastatin  40 to atorvastatin 40    Overweight  I recommended weight loss and this may help both her DVT/PE risk and her dyslipidemia      Stephani Police, MD  Attending physician, cardiology  [facility]  [address]  [place], [place] [address]  [phone]

## 2014-12-03 LAB — POC ELECTROCARDIOGRAM WITH RHYTHM STRIP: QTC: 446

## 2014-12-24 ENCOUNTER — Ambulatory Visit (INDEPENDENT_AMBULATORY_CARE_PROVIDER_SITE_OTHER): Payer: Medicare Other | Admitting: Cardiovascular Disease

## 2014-12-24 VITALS — BP 128/82 | HR 60 | Ht 62.0 in | Wt 151.2 lb

## 2014-12-24 DIAGNOSIS — E039 Hypothyroidism, unspecified: Secondary | ICD-10-CM

## 2014-12-24 DIAGNOSIS — R079 Chest pain, unspecified: Secondary | ICD-10-CM

## 2014-12-24 DIAGNOSIS — R5381 Other malaise: Secondary | ICD-10-CM

## 2014-12-24 DIAGNOSIS — E785 Hyperlipidemia, unspecified: Secondary | ICD-10-CM

## 2014-12-24 DIAGNOSIS — I341 Nonrheumatic mitral (valve) prolapse: Secondary | ICD-10-CM

## 2014-12-24 DIAGNOSIS — R0602 Shortness of breath: Secondary | ICD-10-CM

## 2014-12-24 DIAGNOSIS — R5383 Other fatigue: Secondary | ICD-10-CM

## 2014-12-24 DIAGNOSIS — R002 Palpitations: Secondary | ICD-10-CM

## 2014-12-24 DIAGNOSIS — Z79899 Other long term (current) drug therapy: Secondary | ICD-10-CM

## 2014-12-24 DIAGNOSIS — R0789 Other chest pain: Secondary | ICD-10-CM

## 2014-12-24 NOTE — Patient Instructions (Signed)
Your physician recommends that you return for lab work in: fasting.  Your physician has requested that you have en exercise stress myoview. For further information please visit [email or web address]. Please follow instruction sheet, as given.  Your physician has requested that you have an echocardiogram. Echocardiography is a painless test that uses sound waves to create images of your heart. It provides your doctor with information about the size and shape of your heart and how well your heart's chambers and valves are working. This procedure takes approximately one hour. There are no restrictions for this procedure.   Your physician recommends that you schedule a follow-up appointment in: 6-7 weeks.

## 2014-12-27 ENCOUNTER — Encounter (HOSPITAL_COMMUNITY): Payer: Self-pay | Admitting: *Deleted

## 2014-12-27 LAB — CBC
HCT: 37.7 % (ref 36.0–46.0)
HEMOGLOBIN: 12.5 g/dL (ref 12.0–15.0)
MCH: 29.6 pg (ref 26.0–34.0)
MCHC: 33.2 g/dL (ref 30.0–36.0)
MCV: 89.3 fL (ref 78.0–100.0)
MPV: 10.8 fL (ref 8.6–12.4)
PLATELETS: 305 10*3/uL (ref 150–400)
RBC: 4.22 MIL/uL (ref 3.87–5.11)
RDW: 13.8 % (ref 11.5–15.5)
WBC: 6.2 10*3/uL (ref 4.0–10.5)

## 2014-12-27 LAB — COMPREHENSIVE METABOLIC PANEL
ALBUMIN: 4.2 g/dL (ref 3.5–5.2)
ALT: 30 U/L (ref 0–35)
AST: 28 U/L (ref 0–37)
Alkaline Phosphatase: 97 U/L (ref 39–117)
BILIRUBIN TOTAL: 0.4 mg/dL (ref 0.2–1.2)
BUN: 13 mg/dL (ref 6–23)
CO2: 25 mEq/L (ref 19–32)
Calcium: 9.4 mg/dL (ref 8.4–10.5)
Chloride: 102 mEq/L (ref 96–112)
Creat: 0.82 mg/dL (ref 0.50–1.10)
Glucose, Bld: 106 mg/dL — ABNORMAL HIGH (ref 70–99)
POTASSIUM: 4.4 meq/L (ref 3.5–5.3)
SODIUM: 140 meq/L (ref 135–145)
TOTAL PROTEIN: 7.1 g/dL (ref 6.0–8.3)

## 2014-12-27 LAB — LIPID PANEL
Cholesterol: 182 mg/dL (ref 0–200)
HDL: 38 mg/dL — AB (ref >=46)
LDL Cholesterol: 96 mg/dL (ref 0–99)
TRIGLYCERIDES: 241 mg/dL — AB (ref <150)
Total CHOL/HDL Ratio: 4.8 Ratio
VLDL: 48 mg/dL — AB (ref 0–40)

## 2014-12-27 LAB — TSH: TSH: 5.003 u[IU]/mL — ABNORMAL HIGH (ref 0.350–4.500)

## 2014-12-28 ENCOUNTER — Encounter: Payer: Self-pay | Admitting: Cardiovascular Disease

## 2014-12-28 DIAGNOSIS — R0602 Shortness of breath: Secondary | ICD-10-CM

## 2014-12-28 DIAGNOSIS — I341 Nonrheumatic mitral (valve) prolapse: Secondary | ICD-10-CM | POA: Insufficient documentation

## 2014-12-28 DIAGNOSIS — R0789 Other chest pain: Secondary | ICD-10-CM | POA: Insufficient documentation

## 2014-12-28 DIAGNOSIS — R002 Palpitations: Secondary | ICD-10-CM | POA: Insufficient documentation

## 2014-12-28 HISTORY — DX: Shortness of breath: R06.02

## 2014-12-28 NOTE — Progress Notes (Signed)
Patient ID: Suzanne Galloway, female   DOB: July 04, 1941, 74 y.o.   MRN: [id number]     PATIENT PROFILE: Suzanne Galloway is a 74 y.o. female who is a former patient of Dr. Melvern Banker.  She last saw him over 5 years ago.  She presents to the office today to establish cardiology care and for evaluation of increasing shortness of breath and palpitations.   HPI:  Suzanne Galloway is a 74 y.o. female who has a history of hypothyroidism, palpitations, hyperlipidemia, and recurrent bronchitis.  Remotely, she had seen Dr. Melvern Banker, but had not seen him in over 5 years.  She tells me that she had seen him for mitral valve prolapse.  Recently, she has started to notice increasing shortness of breath with minimal activity.  She admits to rare episodes of chest pressure.  She also has experienced episodes of palpitations.  She admits to a 10 pound weight gain over the past 2 months.  She has a history of GERD for which she takes omeprazole.  She has been taking Toprol-XL 50 mg daily.  There also is a history of hyperlipidemia for which she has been on atorvastatin 10 mg.  She has history of hypothyroidism for which she takes Synthroid 75 g.  She takes Lexapro 20 mg for depression.  She tells me she underwent hand surgery by Dr. Baldemar Lenis and has remotely undergone 4 prior hand surgeries.  He has not been active since her recent surgery.  Because of her recent increased symptoms of shortness of breath with chest pressure.  She now presents for cardiology evaluation.  Past Medical History  Diagnosis Date  . Mitral valve prolapse   . GERD (gastroesophageal reflux disease)   . Thyroid disease   . Depression     Past Surgical History  Procedure Laterality Date  . Toe surgery    . Cholecystectomy    . Back surgery    . Bladder surgery    . Abdominal hysterectomy    . Carpel tunnel      Allergies  Allergen Reactions  . Morphine And Related Nausea And Vomiting  . Vesicare [Solifenacin]     constipation    Current Outpatient  Prescriptions  Medication Sig Dispense Refill  . albuterol (PROVENTIL HFA;VENTOLIN HFA) 108 (90 BASE) MCG/ACT inhaler Inhale 2 puffs into the lungs every 4 (four) hours as needed for wheezing. 6.7 g 3  . atorvastatin (LIPITOR) 10 MG tablet Take 10 mg by mouth daily.    . budesonide-formoterol (SYMBICORT) 80-4.5 MCG/ACT inhaler Inhale 2 puffs into the lungs 2 (two) times daily.    . Calcium-Vitamin D 500-125 MG-UNIT TABS Take 1 tablet by mouth daily.    . cholecalciferol (VITAMIN D) 1000 UNITS tablet Take 1,000 Units by mouth daily.    . cyanocobalamin 1000 MCG tablet Take 1,000 mcg by mouth daily.    Marland Kitchen escitalopram (LEXAPRO) 20 MG tablet Take 20 mg by mouth daily.    Marland Kitchen guaiFENesin (MUCINEX) 600 MG 12 hr tablet Take 1,200 mg by mouth 2 (two) times daily as needed.     Marland Kitchen levothyroxine (SYNTHROID, LEVOTHROID) 75 MCG tablet 1 tablet every mon, wed, fri    . levothyroxine (SYNTHROID, LEVOTHROID) 88 MCG tablet 1 tablet every tues, thurs, sat, sun    . loratadine (CLARITIN) 10 MG tablet Take 10 mg by mouth daily.    . metoprolol succinate (TOPROL-XL) 50 MG 24 hr tablet Take 50 mg by mouth daily. Take with or immediately following a meal.    .  naproxen sodium (ANAPROX) 220 MG tablet Take 220 mg by mouth 2 (two) times daily as needed (for pain).    Marland Kitchen omeprazole (PRILOSEC) 40 MG capsule Take 40 mg by mouth daily.    . predniSONE (DELTASONE) 20 MG tablet Take 40 mg by mouth daily.     No current facility-administered medications for this visit.    Social history is notable in that she is widowed for the past 3 years.  She has 3 children.  She is retired.  She lives by herself.  There is no history of tobacco use or alcohol.  She remains active in church.  Family History  Problem Relation Age of Onset  . COPD Father     smoked  . Heart disease Father   . Esophageal cancer Father     smoked  . Deep vein thrombosis Brother     ROS General: Negative; No fevers, chills, or night sweats HEENT:  Negative; No changes in vision or hearing, sinus congestion, difficulty swallowing Pulmonary: Negative; No cough, wheezing, shortness of breath, hemoptysis Cardiovascular:  See HPI;  GI: Positive for GERD; No nausea, vomiting, diarrhea, or abdominal pain GU: Negative; No dysuria, hematuria, or difficulty voiding Musculoskeletal: Positive for multiple hand surgeries; no myalgias, joint pain, or weakness Hematologic/Oncologic: Negative; no easy bruising, bleeding Endocrine: Negative; no heat/cold intolerance; no diabetes Neuro: Negative; no changes in balance, headaches Skin: Negative; No rashes or skin lesions Psychiatric: Negative; No behavioral problems, depression Sleep: Negative; No daytime sleepiness, hypersomnolence, bruxism, restless legs, hypnogagnic hallucinations Other comprehensive 14 point system review is negative   Physical Exam BP 128/82 mmHg  Pulse 60  Ht 5' 2"  (1.575 m)  Wt 151 lb 3.2 oz (68.584 kg)  BMI 27.65 kg/m2 General: Alert, oriented, no distress.  Skin: normal turgor, no rashes, warm and dry HEENT: Normocephalic, atraumatic. Pupils equal round and reactive to light; sclera anicteric; extraocular muscles intact; Fundi without hemorrhages or exudates. Nose without nasal septal hypertrophy Mouth/Parynx benign; Mallinpatti scale Neck: No JVD, no carotid bruits; normal carotid upstroke Lungs: clear to ausculatation and percussion; no wheezing or rales Chest wall: without tenderness to palpitation Heart: PMI not displaced, RRR, s1 s2 normal, 1/6 systolic murmur, no diastolic murmur, no rubs, gallops, thrills, or heaves Abdomen: soft, nontender; no hepatosplenomehaly, BS+; abdominal aorta nontender and not dilated by palpation. Back: no CVA tenderness Pulses 2+ Musculoskeletal: full range of motion, normal strength, no joint deformities Extremities: no clubbing cyanosis or edema, Homan's sign negative  Neurologic: grossly nonfocal; Cranial nerves grossly  wnl Psychologic: Normal mood and affect   ECG (independently read by me): Normal sinus rhythm at 60 bpm.  No significant ST segment changes  LABS:  BMP Latest Ref Rng 01/05/2013 09/01/2012 01/09/2010  Glucose 70 - 99 mg/dL 135(H) 89 117(H)  BUN 6 - 23 mg/dL 7 16 17   Creatinine 0.50 - 1.10 mg/dL 0.70 0.75 0.93  Sodium 135 - 145 mEq/L 141 140 141  Potassium 3.5 - 5.1 mEq/L 4.3 4.1 3.7  Chloride 96 - 112 mEq/L 102 102 108  CO2 19 - 32 mEq/L 29 27 28   Calcium 8.4 - 10.5 mg/dL 9.8 9.8 9.3     Hepatic Function Latest Ref Rng 01/09/2010  Total Protein 6.0 - 8.3 g/dL 7.3  Albumin 3.5 - 5.2 g/dL 4.2  AST 0 - 37 U/L 40(H)  ALT 0 - 35 U/L 41(H)  Alk Phosphatase 39 - 117 U/L 58  Total Bilirubin 0.3 - 1.2 mg/dL 0.4     CBC  Latest Ref Rng 01/05/2013 09/01/2012 01/09/2010  WBC 4.0 - 10.5 K/uL 7.5 6.8 5.4  Hemoglobin 12.0 - 15.0 g/dL 12.4 11.3(L) 12.7  Hematocrit 36.0 - 46.0 % 37.0 34.8(L) 38.7  Platelets 150 - 400 K/uL 237 259 263     BNP No results found for: BNP  ProBNP No results found for: PROBNP   Lipid Panel  No results found for: CHOL, TRIG, HDL, CHOLHDL, VLDL, LDLCALC, LDLDIRECT    RADIOLOGY: No results found.   ASSESSMENT AND PLAN: [name] is a 74 year old female who has been on Toprol-XL 50 mg daily.  She has a history of palpitations.  Her ECG today shows normal sinus rhythm without ectopy.  She has noticed a definite decline in her ability to be active with increasing shortness of breath with minimal activity and has experienced episodes of chest pressure.  I am scheduling her for an echo Doppler study to evaluate systolic and diastolic function.  I will also schedule her for an exercise Myoview study to make certain her symptoms are not ischemia mediated.  I have recommended laboratory be obtained including a CBC, CMP, lipid panel, TSH, and vitamin D level.  I will try to obtain prior records from Dr. Melvern Banker.  Since the chart is in [facility] facility  and was not presently available for my review.  She recently was evaluated for recurrent bronchitis.  There is no wheezing on exam today.  I will see her in 4-6 weeks for follow-up evaluation following the above studies and further recommendations will be made at that time  Troy Sine, MD, [facility] 12/28/2014 11:42 AM

## 2014-12-30 LAB — VITAMIN D 1,25 DIHYDROXY
Vitamin D 1, 25 (OH)2 Total: 72 pg/mL (ref 18–72)
Vitamin D2 1, 25 (OH)2: <8 pg/mL
Vitamin D3 1, 25 (OH)2: 72 pg/mL

## 2015-01-01 ENCOUNTER — Emergency Department (HOSPITAL_COMMUNITY): Payer: Medicare Other

## 2015-01-01 ENCOUNTER — Encounter (HOSPITAL_COMMUNITY): Payer: Self-pay | Admitting: Emergency Medicine

## 2015-01-01 ENCOUNTER — Inpatient Hospital Stay (HOSPITAL_COMMUNITY)
Admission: EM | Admit: 2015-01-01 | Discharge: 2015-01-06 | DRG: 192 | Disposition: A | Discharge status: Home / Self Care | Payer: Medicare Other | Attending: Internal Medicine | Admitting: Internal Medicine

## 2015-01-01 DIAGNOSIS — E079 Disorder of thyroid, unspecified: Secondary | ICD-10-CM | POA: Diagnosis present

## 2015-01-01 DIAGNOSIS — J45901 Unspecified asthma with (acute) exacerbation: Secondary | ICD-10-CM | POA: Diagnosis not present

## 2015-01-01 DIAGNOSIS — J441 Chronic obstructive pulmonary disease with (acute) exacerbation: Secondary | ICD-10-CM | POA: Diagnosis not present

## 2015-01-01 DIAGNOSIS — E785 Hyperlipidemia, unspecified: Secondary | ICD-10-CM

## 2015-01-01 DIAGNOSIS — Z79899 Other long term (current) drug therapy: Secondary | ICD-10-CM | POA: Diagnosis not present

## 2015-01-01 DIAGNOSIS — R0602 Shortness of breath: Secondary | ICD-10-CM | POA: Diagnosis present

## 2015-01-01 DIAGNOSIS — F329 Major depressive disorder, single episode, unspecified: Secondary | ICD-10-CM

## 2015-01-01 DIAGNOSIS — R059 Cough, unspecified: Secondary | ICD-10-CM | POA: Diagnosis present

## 2015-01-01 DIAGNOSIS — R05 Cough: Secondary | ICD-10-CM

## 2015-01-01 DIAGNOSIS — R0789 Other chest pain: Secondary | ICD-10-CM | POA: Diagnosis not present

## 2015-01-01 DIAGNOSIS — I10 Essential (primary) hypertension: Secondary | ICD-10-CM

## 2015-01-01 DIAGNOSIS — D649 Anemia, unspecified: Secondary | ICD-10-CM | POA: Diagnosis present

## 2015-01-01 DIAGNOSIS — R002 Palpitations: Secondary | ICD-10-CM

## 2015-01-01 DIAGNOSIS — R519 Headache, unspecified: Secondary | ICD-10-CM

## 2015-01-01 DIAGNOSIS — K219 Gastro-esophageal reflux disease without esophagitis: Secondary | ICD-10-CM | POA: Diagnosis present

## 2015-01-01 DIAGNOSIS — R51 Headache: Secondary | ICD-10-CM

## 2015-01-01 DIAGNOSIS — E782 Mixed hyperlipidemia: Secondary | ICD-10-CM

## 2015-01-01 DIAGNOSIS — Z634 Disappearance and death of family member: Secondary | ICD-10-CM | POA: Diagnosis present

## 2015-01-01 DIAGNOSIS — F32A Depression, unspecified: Secondary | ICD-10-CM | POA: Diagnosis present

## 2015-01-01 DIAGNOSIS — I341 Nonrheumatic mitral (valve) prolapse: Secondary | ICD-10-CM | POA: Diagnosis present

## 2015-01-01 DIAGNOSIS — R509 Fever, unspecified: Secondary | ICD-10-CM

## 2015-01-01 HISTORY — DX: Essential (primary) hypertension: I10

## 2015-01-01 HISTORY — DX: Mixed hyperlipidemia: E78.2

## 2015-01-01 HISTORY — DX: Hypothyroidism, unspecified: E03.9

## 2015-01-01 HISTORY — DX: Unspecified asthma, uncomplicated: J45.909

## 2015-01-01 HISTORY — DX: Pure hypercholesterolemia, unspecified: E78.00

## 2015-01-01 HISTORY — DX: Hyperlipidemia, unspecified: E78.5

## 2015-01-01 HISTORY — DX: Unspecified osteoarthritis, unspecified site: M19.90

## 2015-01-01 HISTORY — DX: Unspecified chronic bronchitis: J42

## 2015-01-01 HISTORY — DX: Unspecified urinary incontinence: R32

## 2015-01-01 LAB — CBC
HCT: 33.8 % — ABNORMAL LOW (ref 36.0–46.0)
Hemoglobin: 10.9 g/dL — ABNORMAL LOW (ref 12.0–15.0)
MCH: 29.3 pg (ref 26.0–34.0)
MCHC: 32.2 g/dL (ref 30.0–36.0)
MCV: 90.9 fL (ref 78.0–100.0)
Platelets: 228 10*3/uL (ref 150–400)
RBC: 3.72 MIL/uL — ABNORMAL LOW (ref 3.87–5.11)
RDW: 13.3 % (ref 11.5–15.5)
WBC: 7.7 10*3/uL (ref 4.0–10.5)

## 2015-01-01 LAB — BRAIN NATRIURETIC PEPTIDE: B Natriuretic Peptide: 87.6 pg/mL (ref 0.0–100.0)

## 2015-01-01 LAB — BASIC METABOLIC PANEL
ANION GAP: 12 (ref 5–15)
BUN: 10 mg/dL (ref 6–23)
CO2: 26 mmol/L (ref 19–32)
Calcium: 9.5 mg/dL (ref 8.4–10.5)
Chloride: 98 mmol/L (ref 96–112)
Creatinine, Ser: 0.9 mg/dL (ref 0.50–1.10)
GFR calc Af Amer: 72 mL/min — ABNORMAL LOW (ref >90)
GFR calc non Af Amer: 62 mL/min — ABNORMAL LOW (ref >90)
Glucose, Bld: 126 mg/dL — ABNORMAL HIGH (ref 70–99)
Potassium: 4.1 mmol/L (ref 3.5–5.1)
Sodium: 136 mmol/L (ref 135–145)

## 2015-01-01 LAB — PHOSPHORUS: Phosphorus: 4.1 mg/dL (ref 2.3–4.6)

## 2015-01-01 LAB — TROPONIN I

## 2015-01-01 LAB — CBC WITH DIFFERENTIAL/PLATELET
BASOS ABS: 0 10*3/uL (ref 0.0–0.1)
Basophils Relative: 0 % (ref 0–1)
Eosinophils Absolute: 0 10*3/uL (ref 0.0–0.7)
Eosinophils Relative: 0 % (ref 0–5)
HCT: 35.9 % — ABNORMAL LOW (ref 36.0–46.0)
Hemoglobin: 11.7 g/dL — ABNORMAL LOW (ref 12.0–15.0)
Lymphocytes Relative: 5 % — ABNORMAL LOW (ref 12–46)
Lymphs Abs: 0.4 10*3/uL — ABNORMAL LOW (ref 0.7–4.0)
MCH: 29.7 pg (ref 26.0–34.0)
MCHC: 32.6 g/dL (ref 30.0–36.0)
MCV: 91.1 fL (ref 78.0–100.0)
Monocytes Absolute: 0.3 10*3/uL (ref 0.1–1.0)
Monocytes Relative: 3 % (ref 3–12)
NEUTROS ABS: 8.4 10*3/uL — AB (ref 1.7–7.7)
NEUTROS PCT: 92 % — AB (ref 43–77)
Platelets: 225 10*3/uL (ref 150–400)
RBC: 3.94 MIL/uL (ref 3.87–5.11)
RDW: 13.2 % (ref 11.5–15.5)
WBC: 9.1 10*3/uL (ref 4.0–10.5)

## 2015-01-01 LAB — HEPATIC FUNCTION PANEL
ALK PHOS: 110 U/L (ref 39–117)
ALT: 30 U/L (ref 0–35)
AST: 31 U/L (ref 0–37)
Albumin: 3.7 g/dL (ref 3.5–5.2)
Total Bilirubin: 0.5 mg/dL (ref 0.3–1.2)
Total Protein: 6.9 g/dL (ref 6.0–8.3)

## 2015-01-01 LAB — TSH: TSH: 1.175 u[IU]/mL (ref 0.350–4.500)

## 2015-01-01 LAB — CREATININE, SERUM
Creatinine, Ser: 0.99 mg/dL (ref 0.50–1.10)
GFR calc Af Amer: 64 mL/min — ABNORMAL LOW (ref >90)
GFR, EST NON AFRICAN AMERICAN: 56 mL/min — AB (ref >90)

## 2015-01-01 LAB — MAGNESIUM: Magnesium: 1.7 mg/dL (ref 1.5–2.5)

## 2015-01-01 MED ORDER — ONDANSETRON HCL 4 MG/2ML IJ SOLN: 4.0000 mg | Freq: Four times a day (QID) | INTRAMUSCULAR | Status: DC | PRN

## 2015-01-01 MED ORDER — SODIUM CHLORIDE 0.9 % IJ SOLN
3.0000 mL | Freq: Two times a day (BID) | INTRAMUSCULAR | Status: DC
  Administered 2015-01-01 – 2015-01-06 (×8): 3 mL via INTRAVENOUS

## 2015-01-01 MED ORDER — ATORVASTATIN CALCIUM 10 MG PO TABS
10.0000 mg | ORAL_TABLET | Freq: Every day | ORAL | Status: DC
  Administered 2015-01-02 – 2015-01-05 (×4): 10 mg via ORAL
  Filled 2015-01-01 (×5): qty 1

## 2015-01-01 MED ORDER — ONDANSETRON HCL 4 MG PO TABS: 4.0000 mg | ORAL_TABLET | Freq: Four times a day (QID) | ORAL | Status: DC | PRN

## 2015-01-01 MED ORDER — IPRATROPIUM-ALBUTEROL 0.5-2.5 (3) MG/3ML IN SOLN
3.0000 mL | Freq: Once | RESPIRATORY_TRACT | Status: AC
  Administered 2015-01-01: 3 mL via RESPIRATORY_TRACT
  Filled 2015-01-01: qty 3

## 2015-01-01 MED ORDER — PANTOPRAZOLE SODIUM 40 MG PO TBEC
40.0000 mg | DELAYED_RELEASE_TABLET | Freq: Two times a day (BID) | ORAL | Status: DC
  Administered 2015-01-01 – 2015-01-06 (×10): 40 mg via ORAL
  Filled 2015-01-01 (×7): qty 1

## 2015-01-01 MED ORDER — METHYLPREDNISOLONE SODIUM SUCC 125 MG IJ SOLR: 125.0000 mg | Freq: Once | INTRAMUSCULAR | Status: DC

## 2015-01-01 MED ORDER — ACETAMINOPHEN 325 MG PO TABS
650.0000 mg | ORAL_TABLET | Freq: Four times a day (QID) | ORAL | Status: DC | PRN
  Administered 2015-01-01 – 2015-01-05 (×3): 650 mg via ORAL
  Filled 2015-01-01 (×3): qty 2

## 2015-01-01 MED ORDER — BISOPROLOL FUMARATE 10 MG PO TABS
10.0000 mg | ORAL_TABLET | Freq: Every day | ORAL | Status: DC
  Administered 2015-01-02 – 2015-01-06 (×5): 10 mg via ORAL
  Filled 2015-01-01 (×6): qty 1

## 2015-01-01 MED ORDER — CARBOXYMETHYLCELLULOSE SODIUM 1 % OP SOLN: 1.0000 [drp] | Freq: Two times a day (BID) | OPHTHALMIC | Status: DC

## 2015-01-01 MED ORDER — FLUTICASONE PROPIONATE 50 MCG/ACT NA SUSP
1.0000 | Freq: Every day | NASAL | Status: DC
  Administered 2015-01-01 – 2015-01-06 (×6): 1 via NASAL
  Filled 2015-01-01: qty 16

## 2015-01-01 MED ORDER — HYDROCOD POLST-CHLORPHEN POLST 10-8 MG/5ML PO LQCR
5.0000 mL | Freq: Two times a day (BID) | ORAL | Status: DC | PRN
Start: 2015-01-01 — End: 2015-01-02
  Administered 2015-01-02: 5 mL via ORAL
  Filled 2015-01-01: qty 5

## 2015-01-01 MED ORDER — SODIUM CHLORIDE 0.9 % IV BOLUS (SEPSIS)
500.0000 mL | Freq: Once | INTRAVENOUS | Status: AC
  Administered 2015-01-01: 500 mL via INTRAVENOUS

## 2015-01-01 MED ORDER — ASPIRIN EC 81 MG PO TBEC
81.0000 mg | DELAYED_RELEASE_TABLET | Freq: Every day | ORAL | Status: DC
  Administered 2015-01-01 – 2015-01-06 (×6): 81 mg via ORAL
  Filled 2015-01-01 (×6): qty 1

## 2015-01-01 MED ORDER — HYDROCODONE-ACETAMINOPHEN 5-325 MG PO TABS
1.0000 | ORAL_TABLET | Freq: Once | ORAL | Status: AC
  Administered 2015-01-01: 1 via ORAL
  Filled 2015-01-01: qty 1

## 2015-01-01 MED ORDER — ACETAMINOPHEN 650 MG RE SUPP: 650.0000 mg | Freq: Four times a day (QID) | RECTAL | Status: DC | PRN

## 2015-01-01 MED ORDER — HEPARIN SODIUM (PORCINE) 5000 UNIT/ML IJ SOLN
5000.0000 [IU] | Freq: Three times a day (TID) | INTRAMUSCULAR | Status: DC
  Administered 2015-01-01 – 2015-01-06 (×12): 5000 [IU] via SUBCUTANEOUS
  Filled 2015-01-01 (×16): qty 1

## 2015-01-01 MED ORDER — BUDESONIDE 0.25 MG/2ML IN SUSP
0.2500 mg | Freq: Two times a day (BID) | RESPIRATORY_TRACT | Status: DC
  Administered 2015-01-01 – 2015-01-04 (×5): 0.25 mg via RESPIRATORY_TRACT
  Filled 2015-01-01 (×11): qty 2

## 2015-01-01 MED ORDER — LEVOTHYROXINE SODIUM 88 MCG PO TABS
88.0000 ug | ORAL_TABLET | Freq: Every day | ORAL | Status: DC
  Administered 2015-01-02 – 2015-01-06 (×5): 88 ug via ORAL
  Filled 2015-01-01 (×6): qty 1

## 2015-01-01 MED ORDER — ALBUTEROL SULFATE (2.5 MG/3ML) 0.083% IN NEBU
2.5000 mg | INHALATION_SOLUTION | RESPIRATORY_TRACT | Status: DC | PRN
  Administered 2015-01-03 – 2015-01-04 (×2): 2.5 mg via RESPIRATORY_TRACT
  Filled 2015-01-01: qty 3

## 2015-01-01 MED ORDER — METHYLPREDNISOLONE SODIUM SUCC 125 MG IJ SOLR
60.0000 mg | Freq: Three times a day (TID) | INTRAMUSCULAR | Status: DC
  Administered 2015-01-01 – 2015-01-03 (×7): 60 mg via INTRAVENOUS
  Filled 2015-01-01 (×4): qty 0.96
  Filled 2015-01-01 (×2): qty 2
  Filled 2015-01-01 (×5): qty 0.96

## 2015-01-01 MED ORDER — IPRATROPIUM-ALBUTEROL 0.5-2.5 (3) MG/3ML IN SOLN
3.0000 mL | Freq: Four times a day (QID) | RESPIRATORY_TRACT | Status: DC
  Administered 2015-01-01 – 2015-01-04 (×10): 3 mL via RESPIRATORY_TRACT
  Filled 2015-01-01 (×11): qty 3

## 2015-01-01 MED ORDER — ESCITALOPRAM OXALATE 20 MG PO TABS
20.0000 mg | ORAL_TABLET | Freq: Every day | ORAL | Status: DC
Start: 2015-01-02 — End: 2015-01-06
  Administered 2015-01-02 – 2015-01-06 (×5): 20 mg via ORAL
  Filled 2015-01-01 (×5): qty 1

## 2015-01-01 MED ORDER — SODIUM CHLORIDE 0.9 % IV SOLN
INTRAVENOUS | Status: AC
  Administered 2015-01-01: 21:00:00 via INTRAVENOUS

## 2015-01-01 MED ORDER — POLYVINYL ALCOHOL 1.4 % OP SOLN
1.0000 [drp] | Freq: Two times a day (BID) | OPHTHALMIC | Status: DC
  Administered 2015-01-01 – 2015-01-06 (×10): 1 [drp] via OPHTHALMIC
  Filled 2015-01-01: qty 15

## 2015-01-01 MED ORDER — DOXYCYCLINE HYCLATE 100 MG PO TABS
100.0000 mg | ORAL_TABLET | Freq: Two times a day (BID) | ORAL | Status: DC
  Administered 2015-01-01 – 2015-01-03 (×4): 100 mg via ORAL
  Filled 2015-01-01 (×5): qty 1

## 2015-01-01 MED ORDER — LORATADINE 10 MG PO TABS
10.0000 mg | ORAL_TABLET | Freq: Every day | ORAL | Status: DC
  Administered 2015-01-02: 10 mg via ORAL
  Filled 2015-01-01: qty 1

## 2015-01-01 NOTE — H&P (Signed)
[facility] History and Physical  Suzanne Galloway HUT:[id number] DOB: [birth year] DOA: 01/01/2015  Referring physician: Dr. Ralene Bathe PCP: Irven Shelling, MD   Chief Complaint: Cough, subjective fever, shortness of breath, chest pressure and worsening wheezing  HPI: Suzanne Galloway is a 74 y.o. female with past medical history significant for thyroid disease, asthma, GERD, essential hypertension, hyperlipidemia and history of mitral valve prolapse; who presented to the ED with complaints of approximately 2 weeks shortness of breath, cough and general malaise. Patient reports that his symptoms have worsened over the last 3 days, and has now been associated with fever, chest pressure and productive cough. Patient was initially seen by her PCP about 3 days prior to admission and there was mild concern given her her chest pressure and worsening breathing with activity of angina equivalent. Patient reports that since she was not actively having any active issues at the of her visit, decision was made to set up workup as an outpatient with a cardiologist and to continue treating her just for potential asthma exacerbation; despite outpatient treatment her symptoms continue worsening and prompted her to the ED for further evaluation and treatment. On my exam patient denies active chest discomfort, nausea, vomiting, abdominal pain, headaches, blurred vision, focal weakness/deficits, diarrhea, melena, or hematochezia.  In the ED patient was found to easily short of breath and with continue wheezing that failed to improve to nebulizer treatments. Oxygen saturation was a stable and a chest x-ray didn't show any infiltrates. Troponins 1 was negative and EKG without any acute ischemic changes. [facility] has been called to admit the patient for further evaluation and treatment.    Review of Systems:  Patient reports some orthopnea at bedtime for the last 2 days prior to admission and some intermittent episodes  of hoarseness; Otherwise negative except as mentioned on history of present illness   Past Medical History  Diagnosis Date  . Mitral valve prolapse   . GERD (gastroesophageal reflux disease)   . Thyroid disease   . Depression   . Asthma   . Hyperlipemia    Past Surgical History  Procedure Laterality Date  . Toe surgery    . Cholecystectomy    . Back surgery    . Bladder surgery    . Abdominal hysterectomy    . Carpel tunnel     Social History:  reports that she has never smoked. She has never used smokeless tobacco. She reports that she drinks alcohol. She reports that she does not use illicit drugs.  Allergies  Allergen Reactions  . Lactulose Diarrhea  . Morphine And Related Nausea And Vomiting  . Percocet [Oxycodone-Acetaminophen] Other (See Comments)    hallucinations  . Vesicare [Solifenacin]     constipation    Family History  Problem Relation Age of Onset  . COPD Father     smoked  . Heart disease Father   . Esophageal cancer Father     smoked  . Deep vein thrombosis Brother     Prior to Admission medications   Medication Sig Start Date End Date Taking? Authorizing Provider  albuterol (PROVENTIL HFA;VENTOLIN HFA) 108 (90 BASE) MCG/ACT inhaler Inhale 2 puffs into the lungs every 4 (four) hours as needed for wheezing. 01/05/13  Yes Michiel Cowboy, MD  atorvastatin (LIPITOR) 10 MG tablet Take 10 mg by mouth daily at 6 PM.    Yes Historical Provider, MD  Calcium-Vitamin D 500-125 MG-UNIT TABS Take 1 tablet by mouth daily.   Yes Historical Provider, MD  cholecalciferol (VITAMIN D) 1000 UNITS tablet Take 1,000 Units by mouth daily.   Yes Historical Provider, MD  cyanocobalamin 1000 MCG tablet Take 1,000 mcg by mouth daily.   Yes Historical Provider, MD  escitalopram (LEXAPRO) 20 MG tablet Take 20 mg by mouth daily.   Yes Historical Provider, MD  guaiFENesin (MUCINEX) 600 MG 12 hr tablet Take 600 mg by mouth daily as needed for cough.    Yes Historical Provider, MD    levothyroxine (SYNTHROID, LEVOTHROID) 75 MCG tablet 1 tablet every mon, wed, fri   Yes Historical Provider, MD  levothyroxine (SYNTHROID, LEVOTHROID) 88 MCG tablet 1 tablet every tues, thurs, sat, sun   Yes Historical Provider, MD  loratadine (CLARITIN) 10 MG tablet Take 10 mg by mouth daily as needed for allergies.    Yes Historical Provider, MD  metoprolol succinate (TOPROL-XL) 50 MG 24 hr tablet Take 50 mg by mouth daily. Take with or immediately following a meal.   Yes Historical Provider, MD  naproxen sodium (ANAPROX) 220 MG tablet Take 220 mg by mouth 2 (two) times daily with a meal.   Yes Historical Provider, MD  omeprazole (PRILOSEC) 40 MG capsule Take 40 mg by mouth 2 (two) times daily.    Yes Historical Provider, MD  Polyvinyl Alcohol-Povidone (REFRESH OP) Place 1 drop into both eyes daily.   Yes Historical Provider, MD  SYMBICORT 160-4.5 MCG/ACT inhaler Inhale 2 puffs into the lungs daily.  12/31/14  Yes Historical Provider, MD  budesonide-formoterol (SYMBICORT) 80-4.5 MCG/ACT inhaler Inhale 2 puffs into the lungs 2 (two) times daily. Patient not taking: Reported on 01/01/2015 06/16/13   Tanda Rockers, MD   Physical Exam: Filed Vitals:   01/01/15 1600 01/01/15 1615 01/01/15 1645 01/01/15 1653  BP: 112/46 114/41 114/44   Pulse: 70 67 71 82  Temp:      TempSrc:      Resp:  18    SpO2: 95% 95% 98% 96%    Wt Readings from Last 3 Encounters:  12/24/14 68.584 kg (151 lb 3.2 oz)  06/27/13 68.856 kg (151 lb 12.8 oz)  06/16/13 68.04 kg (150 lb)    General:  Patient alert, awake and oriented 3; cooperative to examination and currently without chest pain. Patient is slightly short of breath and unable to speak in full sentences. Eyes: PERRL, normal lids, irises & conjunctiva; no icterus, no nystagmus ENT: grossly normal hearing, lips & tongue; no erythema or exudates appreciated Neck: no LAD, masses or thyromegaly; no JVD Cardiovascular: RRR, no m/r/g. No LE edema. Telemetry: SR, no  arrhythmias  Respiratory: Diffuse rhonchi, positive expiratory wheezing; no rales. No use of accessory muscles appreciated.  Abdomen: soft, nt, nd and with positive bowel sounds Skin: no rash, petechiae, bruises, open wounds or induration seen on exam Musculoskeletal: grossly normal tone BUE/BLE Psychiatric: grossly normal mood and affect, speech fluent and appropriate Neurologic: grossly non-focal.          Labs on Admission:  Basic Metabolic Panel:  Recent Labs Lab 12/27/14 0714 01/01/15 1425  NA 140 136  K 4.4 4.1  CL 102 98  CO2 25 26  GLUCOSE 106* 126*  BUN 13 10  CREATININE 0.82 0.90  CALCIUM 9.4 9.5   Liver Function Tests:  Recent Labs Lab 12/27/14 0714  AST 28  ALT 30  ALKPHOS 97  BILITOT 0.4  PROT 7.1  ALBUMIN 4.2   CBC:  Recent Labs Lab 12/27/14 0714 01/01/15 1425  WBC 6.2 9.1  NEUTROABS  --  8.4*  HGB 12.5 11.7*  HCT 37.7 35.9*  MCV 89.3 91.1  PLT 305 225   Cardiac Enzymes:  Recent Labs Lab 01/01/15 1425  TROPONINI <0.03    BNP (last 3 results)  Recent Labs  01/01/15 1425  BNP 87.6   Radiological Exams on Admission: Dg Chest 2 View  01/01/2015   CLINICAL DATA:  Chest pain and difficulty breathing  EXAM: CHEST  2 VIEW  COMPARISON:  January 05, 2013  FINDINGS: There is no edema or consolidation. The heart size and pulmonary vascularity are normal. No adenopathy. No pneumothorax. No bone lesions.  IMPRESSION: No edema or consolidation.   Electronically Signed   By: Lowella Grip III M.D.   On: 01/01/2015 13:22    EKG:  No acute ischemic appreciated; regular rate and sinus rhythm.  Assessment/Plan 1-shortness of breath/cough/subjective fever: Patient with history of asthma (had no follow with pulmonologist in the last 2 years); having shortness of breath, subjective fever/cough for the last 2 weeks; but Even worst in the last 4 days. -Will admit to telemetry (see below) -Start treatment with Solu-Medrol, nebulizer (Pulmicort,  Duo-nebs) -Will check influenza by PCR -Doxycycline for bronchitis and coverage of atypical infection -PRN antitussives -Chest x-ray without acute infiltrates -Will change patient Lopressor to zebeta  2-Chest pressure: Patient reports associated chest pressure worse with activity and orthopnea symptoms with her shortness of breath. Keeping the fact the patient has a heart score of 3-4. -Will cycle cardiac enzymes, check EKG in the morning and perform 2-D echo -Patient with has already set up myocardial perfusion study and 2-D echo in the outpatient setting; But given heart score and ongoing chest pressure with activity and shortness of breath. Will initiate workup as mentioned and depending results will might need to contact cardiology service. -Patient will be placed on aspirin, continue statins and selective beta blocker  3-hyperlipidemia: Continue statins  4-essential hypertension: now on zebeta -will follow response and adjust as needed -Blood pressure stable.  5-Thyroid disease: Will check TSH.  -Continue Synthroid  6-GERD (gastroesophageal reflux disease): Continue PPI  7-Depression: Continue Lexapro  8-allergic rhinitis: Will continue the use of Claritin and Flonase.   Code Status: Full code DVT Prophylaxis: Heparin Family Communication: Daughter at bedside Disposition Plan: Inpatient, telemetry bed, length of stay more than 2 midnights (approximately 48 hours depending on patient's clinical response and results of workup)  Time spent: 50 minutes  Barton Dubois [facility] Pager [phone]

## 2015-01-01 NOTE — Progress Notes (Signed)
New Admission Note:   Arrival Method: via stretcher from ED Mental Orientation: Alert and Oriented x4 Telemetry: Placed on box 6E24, Sinus Rhythm Assessment: Completed Skin: Intact, warm, and dry IV: Left Forearm Normal Saline at 20cc/hr Pain: C/o Headache pain 6/10 Tubes: N/A Safety Measures: Educated on fall prevention safety plan, patient acknowledged and understood. Admission: Completed [address] Orientation: Patient has been orientated to the room, unit and staff.  Family: at bedside   Orders have been reviewed and implemented. Will continue to monitor the patient. Call light has been placed within reach and bed alarm has been activated.    Dorothea Glassman, RN  Phone number: [phone]

## 2015-01-01 NOTE — ED Notes (Signed)
Pt arrived from home by [facility] with c/o SOB and chest pressure. Pt has been having SOB and chest pressure for about a week that has worsen today. Pt has been seen by PCP on 3/7 and had EKG done and lipid panel done. Supposed to have stress test done on 01/08/15. Pt has a productive cough as well with yellow mucous.  EMS noted some wheezing and rhonchi administered Albuterol 10mg , Atrovent 0.5mg  and Solumedrol 125mg . BP-128/62 HR-74 o2SAT 96%RA.

## 2015-01-01 NOTE — ED Provider Notes (Signed)
CSN: [id number]     Arrival date & time 01/01/15  1232 History   First MD Initiated Contact with Patient 01/01/15 1243     Chief Complaint  Patient presents with  . Shortness of Breath  . Chest Pain    Patient is a 74 y.o. female presenting with shortness of breath and chest pain. The history is provided by the patient. No language interpreter was used.  Shortness of Breath Associated symptoms: chest pain   Chest Pain Associated symptoms: shortness of breath    [name] presents for the evaluation of cough and shortness of breath. She reports 2 weeks of progressive shortness of breath with dyspnea on exertion and  Cough productive of yellow and gray sputum. She has chest heaviness that has been constant for the last 2 weeks. She denies any fevers, vomiting, leg swelling. She does have a muscle cramps in her legs at times. She was seen at her primary care physician's office and was referred to cardiology for outpatient stress testing.  Past Medical History  Diagnosis Date  . Mitral valve prolapse   . GERD (gastroesophageal reflux disease)   . Thyroid disease   . Depression   . Asthma   . Hyperlipemia    Past Surgical History  Procedure Laterality Date  . Toe surgery    . Cholecystectomy    . Back surgery    . Bladder surgery    . Abdominal hysterectomy    . Carpel tunnel     Family History  Problem Relation Age of Onset  . COPD Father     smoked  . Heart disease Father   . Esophageal cancer Father     smoked  . Deep vein thrombosis Brother    History  Substance Use Topics  . Smoking status: Never Smoker   . Smokeless tobacco: Never Used  . Alcohol Use: Yes     Comment: occ   OB History    No data available     Review of Systems  Respiratory: Positive for shortness of breath.   Cardiovascular: Positive for chest pain.  All other systems reviewed and are negative.     Allergies  Morphine and related and Vesicare  Home Medications   Prior to Admission  medications   Medication Sig Start Date End Date Taking? Authorizing Provider  albuterol (PROVENTIL HFA;VENTOLIN HFA) 108 (90 BASE) MCG/ACT inhaler Inhale 2 puffs into the lungs every 4 (four) hours as needed for wheezing. 01/05/13   Michiel Cowboy, MD  atorvastatin (LIPITOR) 10 MG tablet Take 10 mg by mouth daily.    Historical Provider, MD  budesonide-formoterol (SYMBICORT) 80-4.5 MCG/ACT inhaler Inhale 2 puffs into the lungs 2 (two) times daily. 06/16/13   Tanda Rockers, MD  Calcium-Vitamin D [phone] MG-UNIT TABS Take 1 tablet by mouth daily.    Historical Provider, MD  cholecalciferol (VITAMIN D) 1000 UNITS tablet Take 1,000 Units by mouth daily.    Historical Provider, MD  cyanocobalamin 1000 MCG tablet Take 1,000 mcg by mouth daily.    Historical Provider, MD  escitalopram (LEXAPRO) 20 MG tablet Take 20 mg by mouth daily.    Historical Provider, MD  guaiFENesin (MUCINEX) 600 MG 12 hr tablet Take 1,200 mg by mouth 2 (two) times daily as needed.     Historical Provider, MD  levothyroxine (SYNTHROID, LEVOTHROID) 75 MCG tablet 1 tablet every mon, wed, fri    Historical Provider, MD  levothyroxine (SYNTHROID, LEVOTHROID) 88 MCG tablet 1 tablet every tues, thurs,  sat, sun    Historical Provider, MD  loratadine (CLARITIN) 10 MG tablet Take 10 mg by mouth daily.    Historical Provider, MD  metoprolol succinate (TOPROL-XL) 50 MG 24 hr tablet Take 50 mg by mouth daily. Take with or immediately following a meal.    Historical Provider, MD  naproxen sodium (ANAPROX) 220 MG tablet Take 220 mg by mouth 2 (two) times daily as needed (for pain).    Historical Provider, MD  omeprazole (PRILOSEC) 40 MG capsule Take 40 mg by mouth daily.    Historical Provider, MD  predniSONE (DELTASONE) 20 MG tablet Take 40 mg by mouth daily.    Historical Provider, MD   BP 134/50 mmHg  Pulse 74  Temp(Src) 99 F (37.2 C) (Oral)  Resp 16 Physical Exam  Constitutional: She is oriented to person, place, and time. She  appears well-developed and well-nourished.  HENT:  Head: Normocephalic and atraumatic.  Cardiovascular: Normal rate and regular rhythm.   No murmur heard. Pulmonary/Chest:  Mild tachypnea with wheezes bilaterally  Abdominal: Soft. There is no tenderness. There is no rebound and no guarding.  Musculoskeletal: She exhibits no edema or tenderness.  Neurological: She is alert and oriented to person, place, and time.  Skin: Skin is warm and dry.  Psychiatric: She has a normal mood and affect. Her behavior is normal.  Nursing note and vitals reviewed.   ED Course  Procedures (including critical care time) Labs Review Labs Reviewed  BASIC METABOLIC PANEL  CBC WITH DIFFERENTIAL/PLATELET  TROPONIN I  BRAIN NATRIURETIC PEPTIDE    Imaging Review Dg Chest 2 View  01/01/2015   CLINICAL DATA:  Chest pain and difficulty breathing  EXAM: CHEST  2 VIEW  COMPARISON:  January 05, 2013  FINDINGS: There is no edema or consolidation. The heart size and pulmonary vascularity are normal. No adenopathy. No pneumothorax. No bone lesions.  IMPRESSION: No edema or consolidation.   Electronically Signed   By: Lowella Grip III M.D.   On: 01/01/2015 13:22     EKG Interpretation   Date/Time:  Tuesday January 01 2015 12:34:16 EDT Ventricular Rate:  78 PR Interval:  146 QRS Duration: 80 QT Interval:  389 QTC Calculation: 443 R Axis:   46 Text Interpretation:  Sinus rhythm Borderline T abnormalities, lateral  leads Confirmed by Hazle Coca [phone]) on 01/01/2015 1:03:50 PM      MDM   Final diagnoses:  COPD with acute exacerbation     Patient with history of asthma/COPD here for progressive shortness of breath, chest pressure, productive cough. Clinical picture is consistent with COPD exacerbation. Patient with recurrent wheezing and shortness of breath in the department. Discussed with hospitalist regarding admission for further management.    Quintella Reichert, MD 01/01/15 1747

## 2015-01-02 DIAGNOSIS — R0600 Dyspnea, unspecified: Secondary | ICD-10-CM

## 2015-01-02 LAB — BASIC METABOLIC PANEL
Anion gap: 11 (ref 5–15)
BUN: 12 mg/dL (ref 6–23)
CHLORIDE: 101 mmol/L (ref 96–112)
CO2: 22 mmol/L (ref 19–32)
CREATININE: 0.92 mg/dL (ref 0.50–1.10)
Calcium: 9.1 mg/dL (ref 8.4–10.5)
GFR calc Af Amer: 70 mL/min — ABNORMAL LOW (ref >90)
GFR calc non Af Amer: 61 mL/min — ABNORMAL LOW (ref >90)
Glucose, Bld: 245 mg/dL — ABNORMAL HIGH (ref 70–99)
Potassium: 4 mmol/L (ref 3.5–5.1)
SODIUM: 134 mmol/L — AB (ref 135–145)

## 2015-01-02 LAB — CBC
HEMATOCRIT: 32.9 % — AB (ref 36.0–46.0)
Hemoglobin: 10.7 g/dL — ABNORMAL LOW (ref 12.0–15.0)
MCH: 29.5 pg (ref 26.0–34.0)
MCHC: 32.5 g/dL (ref 30.0–36.0)
MCV: 90.6 fL (ref 78.0–100.0)
PLATELETS: 241 10*3/uL (ref 150–400)
RBC: 3.63 MIL/uL — AB (ref 3.87–5.11)
RDW: 13.2 % (ref 11.5–15.5)
WBC: 13.7 10*3/uL — ABNORMAL HIGH (ref 4.0–10.5)

## 2015-01-02 LAB — INFLUENZA PANEL BY PCR (TYPE A & B)
H1N1FLUPCR: NOT DETECTED
INFLBPCR: NEGATIVE
Influenza A By PCR: NEGATIVE

## 2015-01-02 LAB — LIPID PANEL
Cholesterol: 174 mg/dL (ref 0–200)
HDL: 37 mg/dL — ABNORMAL LOW (ref >39)
LDL Cholesterol: 105 mg/dL — ABNORMAL HIGH (ref 0–99)
Total CHOL/HDL Ratio: 4.7 RATIO
Triglycerides: 158 mg/dL — ABNORMAL HIGH (ref <150)
VLDL: 32 mg/dL (ref 0–40)

## 2015-01-02 LAB — TROPONIN I
Troponin I: <0.03 ng/mL (ref <0.031)
Troponin I: <0.03 ng/mL (ref <0.031)

## 2015-01-02 MED ORDER — GUAIFENESIN 100 MG/5ML PO SYRP
200.0000 mg | ORAL_SOLUTION | ORAL | Status: DC | PRN
  Administered 2015-01-02 – 2015-01-04 (×7): 200 mg via ORAL
  Filled 2015-01-02 (×10): qty 10

## 2015-01-02 MED ORDER — IBUPROFEN 200 MG PO TABS
400.0000 mg | ORAL_TABLET | Freq: Four times a day (QID) | ORAL | Status: DC | PRN
  Administered 2015-01-02 – 2015-01-03 (×3): 400 mg via ORAL
  Filled 2015-01-02 (×3): qty 2

## 2015-01-02 MED ORDER — INSULIN ASPART 100 UNIT/ML ~~LOC~~ SOLN
0.0000 [IU] | Freq: Three times a day (TID) | SUBCUTANEOUS | Status: DC
  Administered 2015-01-03 (×3): 1 [IU] via SUBCUTANEOUS
  Administered 2015-01-04: 2 [IU] via SUBCUTANEOUS
  Administered 2015-01-04: 1 [IU] via SUBCUTANEOUS
  Administered 2015-01-04 – 2015-01-05 (×2): 2 [IU] via SUBCUTANEOUS
  Administered 2015-01-05: 3 [IU] via SUBCUTANEOUS
  Administered 2015-01-06: 2 [IU] via SUBCUTANEOUS

## 2015-01-02 MED ORDER — NON FORMULARY: 180.0000 mg | Freq: Every day | Status: DC

## 2015-01-02 MED ORDER — HYDROCOD POLST-CHLORPHEN POLST 10-8 MG/5ML PO LQCR
5.0000 mL | Freq: Two times a day (BID) | ORAL | Status: DC
  Administered 2015-01-02 – 2015-01-06 (×9): 5 mL via ORAL
  Filled 2015-01-02 (×9): qty 5

## 2015-01-02 MED ORDER — FEXOFENADINE HCL 180 MG PO TABS
180.0000 mg | ORAL_TABLET | Freq: Every day | ORAL | Status: DC
  Administered 2015-01-02 – 2015-01-06 (×5): 180 mg via ORAL
  Filled 2015-01-02 (×5): qty 1

## 2015-01-02 NOTE — Progress Notes (Signed)
UR Completed.  [phone]  

## 2015-01-02 NOTE — Progress Notes (Signed)
  Echocardiogram 2D Echocardiogram has been performed.  Suzanne Galloway 01/02/2015, 12:05 PM

## 2015-01-02 NOTE — Progress Notes (Signed)
[facility] PROGRESS NOTE  MADELYNE MILLIKAN ZOX:[id number] DOB: 19-Jun-1941 DOA: 01/01/2015 PCP: Irven Shelling, MD Interim summary: 74 year old lady admitted for asthma exacerbation. Assessment/Plan: 1. Sob, chest pressure worse with couging and headache: Admitted for asthma exacerbation.  Started on IV steroids, doxycycline . CXR does not show pneumonia.  Resume bronchodilators as needed and pulmicort.  Cough medication as needed.    2. Chest pressure: Unlikely cardiac in origin, she reports worsening with coughing.  Serial enzymes negative and echo is not significant. Repeat EKG in am.  If persistent chest pain or pressure will call cardiology in am.   Resume aspirin and statin.   Hypertension: Well controlled.   Anemia: Appears to be at baseline.  Continue to monitor.    Leukocytosis: Probably from IV steroids.  Monitor.    cbg of 245, start her SSI and get hgba1c in am.     Code Status: full code Family Communication: daughters at bedside Disposition Plan: pending, home when breathign improves   Consultants:  none  Procedures:  echo  Antibiotics:  Doxycycline.   HPI/Subjective: Headache persistent.   Objective: Filed Vitals:   01/02/15 1728  BP: 116/62  Pulse: 67  Temp: 98.5 F (36.9 C)  Resp: 17    Intake/Output Summary (Last 24 hours) at 01/02/15 1842 Last data filed at 01/02/15 1805  Gross per 24 hour  Intake   1200 ml  Output      0 ml  Net   1200 ml   Filed Weights   01/01/15 2012  Weight: 67.722 kg (149 lb 4.8 oz)    Exam:   General:  Alert afebrile comfortable  Cardiovascular: s1s2 tachycardic  Respiratory: bilateral wheezing heard  Abdomen: soft non tender non distended bowel sounds heard  Musculoskeletal: no pedal edema.   Data Reviewed: Basic Metabolic Panel:  Recent Labs Lab 12/27/14 0714 01/01/15 1425 01/01/15 2145 01/02/15 0930  NA 140 136  --  134*  K 4.4 4.1  --  4.0  CL 102 98  --  101   CO2 25 26  --  22  GLUCOSE 106* 126*  --  245*  BUN 13 10  --  12  CREATININE 0.82 0.90 0.99 0.92  CALCIUM 9.4 9.5  --  9.1  MG  --   --  1.7  --   PHOS  --   --  4.1  --    Liver Function Tests:  Recent Labs Lab 12/27/14 0714 01/01/15 2145  AST 28 31  ALT 30 30  ALKPHOS 97 110  BILITOT 0.4 0.5  PROT 7.1 6.9  ALBUMIN 4.2 3.7   No results for input(s): LIPASE, AMYLASE in the last 168 hours. No results for input(s): AMMONIA in the last 168 hours. CBC:  Recent Labs Lab 12/27/14 0714 01/01/15 1425 01/01/15 2145 01/02/15 0930  WBC 6.2 9.1 7.7 13.7*  NEUTROABS  --  8.4*  --   --   HGB 12.5 11.7* 10.9* 10.7*  HCT 37.7 35.9* 33.8* 32.9*  MCV 89.3 91.1 90.9 90.6  PLT 305 225 228 241   Cardiac Enzymes:  Recent Labs Lab 01/01/15 1425 01/01/15 2145 01/02/15 0559 01/02/15 0930  TROPONINI <0.03 <0.03 <0.03 <0.03   BNP (last 3 results)  Recent Labs  01/01/15 1425  BNP 87.6    ProBNP (last 3 results) No results for input(s): PROBNP in the last 8760 hours.  CBG: No results for input(s): GLUCAP in the last 168 hours.  No results found for  this or any previous visit (from the past 240 hour(s)).   Studies: Dg Chest 2 View  01/01/2015   CLINICAL DATA:  Chest pain and difficulty breathing  EXAM: CHEST  2 VIEW  COMPARISON:  January 05, 2013  FINDINGS: There is no edema or consolidation. The heart size and pulmonary vascularity are normal. No adenopathy. No pneumothorax. No bone lesions.  IMPRESSION: No edema or consolidation.   Electronically Signed   By: Lowella Grip III M.D.   On: 01/01/2015 13:22    Scheduled Meds: . aspirin EC  81 mg Oral Daily  . atorvastatin  10 mg Oral q1800  . bisoprolol  10 mg Oral Daily  . budesonide (PULMICORT) nebulizer solution  0.25 mg Nebulization BID  . chlorpheniramine-HYDROcodone  5 mL Oral Q12H  . doxycycline  100 mg Oral Q12H  . escitalopram  20 mg Oral Daily  . fexofenadine  180 mg Oral Daily  . fluticasone  1 spray  Each Nare Daily  . heparin  5,000 Units Subcutaneous 3 times per day  . ipratropium-albuterol  3 mL Nebulization QID  . levothyroxine  88 mcg Oral QAC breakfast  . methylPREDNISolone (SOLU-MEDROL) injection  60 mg Intravenous 3 times per day  . pantoprazole  40 mg Oral BID  . polyvinyl alcohol  1 drop Both Eyes BID  . sodium chloride  3 mL Intravenous Q12H   Continuous Infusions:   Principal Problem:   Asthma exacerbation Active Problems:   Chest pressure   Cough   Low grade fever   Thyroid disease   GERD (gastroesophageal reflux disease)   Depression   HLD (hyperlipidemia)   Benign essential HTN    Time spent: 35  Minutes.    [facility] Pager [phone] If 7PM-7AM, please contact night-coverage at [email or web address], password [facility] 01/02/2015, 6:42 PM  LOS: 1 day

## 2015-01-03 ENCOUNTER — Telehealth (HOSPITAL_COMMUNITY): Payer: Self-pay

## 2015-01-03 LAB — GLUCOSE, CAPILLARY
GLUCOSE-CAPILLARY: 131 mg/dL — AB (ref 70–99)
GLUCOSE-CAPILLARY: 151 mg/dL — AB (ref 70–99)
Glucose-Capillary: 141 mg/dL — ABNORMAL HIGH (ref 70–99)
Glucose-Capillary: 143 mg/dL — ABNORMAL HIGH (ref 70–99)

## 2015-01-03 LAB — BASIC METABOLIC PANEL
Anion gap: 10 (ref 5–15)
BUN: 18 mg/dL (ref 6–23)
CALCIUM: 9.1 mg/dL (ref 8.4–10.5)
CO2: 25 mmol/L (ref 19–32)
CREATININE: 0.86 mg/dL (ref 0.50–1.10)
Chloride: 102 mmol/L (ref 96–112)
GFR calc non Af Amer: 66 mL/min — ABNORMAL LOW (ref >90)
GFR, EST AFRICAN AMERICAN: 76 mL/min — AB (ref >90)
GLUCOSE: 191 mg/dL — AB (ref 70–99)
Potassium: 4 mmol/L (ref 3.5–5.1)
SODIUM: 137 mmol/L (ref 135–145)

## 2015-01-03 LAB — CBC
HCT: 34.3 % — ABNORMAL LOW (ref 36.0–46.0)
Hemoglobin: 11.1 g/dL — ABNORMAL LOW (ref 12.0–15.0)
MCH: 29.5 pg (ref 26.0–34.0)
MCHC: 32.4 g/dL (ref 30.0–36.0)
MCV: 91.2 fL (ref 78.0–100.0)
PLATELETS: 267 10*3/uL (ref 150–400)
RBC: 3.76 MIL/uL — AB (ref 3.87–5.11)
RDW: 13.4 % (ref 11.5–15.5)
WBC: 16.4 10*3/uL — ABNORMAL HIGH (ref 4.0–10.5)

## 2015-01-03 LAB — IRON AND TIBC
IRON: 39 ug/dL — AB (ref 42–145)
Saturation Ratios: 12 % — ABNORMAL LOW (ref 20–55)
TIBC: 335 ug/dL (ref 250–470)
UIBC: 296 ug/dL (ref 125–400)

## 2015-01-03 LAB — FERRITIN: Ferritin: 62 ng/mL (ref 10–291)

## 2015-01-03 LAB — FOLATE: Folate: 12.4 ng/mL

## 2015-01-03 LAB — RETICULOCYTES
RBC.: 3.76 MIL/uL — ABNORMAL LOW (ref 3.87–5.11)
RETIC COUNT ABSOLUTE: 67.7 10*3/uL (ref 19.0–186.0)
Retic Ct Pct: 1.8 % (ref 0.4–3.1)

## 2015-01-03 LAB — RAPID STREP SCREEN (MED CTR MEBANE ONLY): Streptococcus, Group A Screen (Direct): NEGATIVE

## 2015-01-03 LAB — VITAMIN B12: VITAMIN B 12: 495 pg/mL (ref 211–911)

## 2015-01-03 MED ORDER — BENZONATATE 100 MG PO CAPS
200.0000 mg | ORAL_CAPSULE | Freq: Three times a day (TID) | ORAL | Status: DC
  Administered 2015-01-03 – 2015-01-06 (×9): 200 mg via ORAL
  Filled 2015-01-03 (×12): qty 2

## 2015-01-03 MED ORDER — TRAMADOL HCL 50 MG PO TABS
50.0000 mg | ORAL_TABLET | Freq: Four times a day (QID) | ORAL | Status: DC | PRN
Start: 2015-01-03 — End: 2015-01-06

## 2015-01-03 MED ORDER — MENTHOL 3 MG MT LOZG
1.0000 | LOZENGE | OROMUCOSAL | Status: DC | PRN
  Administered 2015-01-03 – 2015-01-04 (×4): 3 mg via ORAL
  Filled 2015-01-03 (×2): qty 9

## 2015-01-03 MED ORDER — LEVOFLOXACIN IN D5W 500 MG/100ML IV SOLN
500.0000 mg | Freq: Once | INTRAVENOUS | Status: AC
  Administered 2015-01-03: 500 mg via INTRAVENOUS
  Filled 2015-01-03: qty 100

## 2015-01-03 NOTE — Telephone Encounter (Signed)
Encounter complete. 

## 2015-01-03 NOTE — Progress Notes (Signed)
[facility] PROGRESS NOTE  Suzanne Galloway QIO:[id number] DOB: 06-Aug-1941 DOA: 01/01/2015 PCP: Irven Shelling, MD Interim summary: 74 year old lady admitted for asthma exacerbation. Assessment/Plan: 1. Sob, chest pressure worse with couging and headache: Admitted for asthma exacerbation.  Started on IV steroids, doxycycline . ,  Her breathing and wheezing has improved but patient reports change in sputum color from clear to green and yellow. Plan to d/c doxy and start her on levaquin.   Her coughing has improved a little, but she still has coughing spells after neb treatments.  Start her tessalon . CXR does not show pneumonia.  Resume bronchodilators as needed and pulmicort.  Rapid strep antigen is negative.     2. Chest pressure: Unlikely cardiac in origin, she reports worsening with coughing. She denies any chest.  Serial enzymes negative and echo is not significant. EKG reviewed , does nto show any ischemic changes, is normal sinus rhythm.  If persistent chest pain or pressure will call cardiology . Recommended outpatient follow up with cardiology as of now.  Resume aspirin and statin.   Hypertension: Well controlled.   Anemia: Appears to be at baseline.  Her repeat hemoglobin is better around 11.1. Anemia panel obtained showed low iron levels and low ferritin levels. Will start her on iron supplements. Stool for occult blood ordered and pending.    Leukocytosis: Probably from IV steroids. She does not have fever. Continue to monitor.     Hyperglycemia:  probably from steroids. hgba1c is pending.    Code Status: full code Family Communication: daughters at bedside Disposition Plan: pending, home when her asthma improves.    Consultants:  none  Procedures:  echo  Antibiotics:  Levaquin.   HPI/Subjective: Headache improved.    Objective: Filed Vitals:   01/03/15 1718  BP: 138/56  Pulse: 65  Temp:   Resp: 18    Intake/Output Summary (Last 24  hours) at 01/03/15 1813 Last data filed at 01/02/15 2030  Gross per 24 hour  Intake    240 ml  Output      0 ml  Net    240 ml   Filed Weights   01/01/15 2012 01/02/15 2030  Weight: 67.722 kg (149 lb 4.8 oz) 68.58 kg (151 lb 3.1 oz)    Exam:   General:  Alert afebrile comfortable  Cardiovascular: s1s2 RRR.   Respiratory: scattered wheezing posteriorly, air entry fair  Abdomen: soft non tender non distended bowel sounds heard  Musculoskeletal: no pedal edema.   Data Reviewed: Basic Metabolic Panel:  Recent Labs Lab 01/01/15 1425 01/01/15 2145 01/02/15 0930 01/03/15 0625  NA 136  --  134* 137  K 4.1  --  4.0 4.0  CL 98  --  101 102  CO2 26  --  22 25  GLUCOSE 126*  --  245* 191*  BUN 10  --  12 18  CREATININE 0.90 0.99 0.92 0.86  CALCIUM 9.5  --  9.1 9.1  MG  --  1.7  --   --   PHOS  --  4.1  --   --    Liver Function Tests:  Recent Labs Lab 01/01/15 2145  AST 31  ALT 30  ALKPHOS 110  BILITOT 0.5  PROT 6.9  ALBUMIN 3.7   No results for input(s): LIPASE, AMYLASE in the last 168 hours. No results for input(s): AMMONIA in the last 168 hours. CBC:  Recent Labs Lab 01/01/15 1425 01/01/15 2145 01/02/15 0930 01/03/15 0625  WBC  9.1 7.7 13.7* 16.4*  NEUTROABS 8.4*  --   --   --   HGB 11.7* 10.9* 10.7* 11.1*  HCT 35.9* 33.8* 32.9* 34.3*  MCV 91.1 90.9 90.6 91.2  PLT 225 228 241 267   Cardiac Enzymes:  Recent Labs Lab 01/01/15 1425 01/01/15 2145 01/02/15 0559 01/02/15 0930  TROPONINI <0.03 <0.03 <0.03 <0.03   BNP (last 3 results)  Recent Labs  01/01/15 1425  BNP 87.6    ProBNP (last 3 results) No results for input(s): PROBNP in the last 8760 hours.  CBG:  Recent Labs Lab 01/03/15 0741 01/03/15 1204 01/03/15 1714  GLUCAP 141* 143* 131*    Recent Results (from the past 240 hour(s))  Rapid strep screen     Status: None   Collection Time: 01/03/15  4:57 PM  Result Value Ref Range Status   Streptococcus, Group A Screen  (Direct) NEGATIVE NEGATIVE Final    Comment: (NOTE) A Rapid Antigen test may result negative if the antigen level in the sample is below the detection level of this test. The FDA has not cleared this test as a stand-alone test therefore the rapid antigen negative result has reflexed to a Group A Strep culture.      Studies: No results found.  Scheduled Meds: . aspirin EC  81 mg Oral Daily  . atorvastatin  10 mg Oral q1800  . benzonatate  200 mg Oral TID  . bisoprolol  10 mg Oral Daily  . budesonide (PULMICORT) nebulizer solution  0.25 mg Nebulization BID  . chlorpheniramine-HYDROcodone  5 mL Oral Q12H  . doxycycline  100 mg Oral Q12H  . escitalopram  20 mg Oral Daily  . fexofenadine  180 mg Oral Daily  . fluticasone  1 spray Each Nare Daily  . heparin  5,000 Units Subcutaneous 3 times per day  . insulin aspart  0-9 Units Subcutaneous TID WC  . ipratropium-albuterol  3 mL Nebulization QID  . levothyroxine  88 mcg Oral QAC breakfast  . methylPREDNISolone (SOLU-MEDROL) injection  60 mg Intravenous 3 times per day  . pantoprazole  40 mg Oral BID  . polyvinyl alcohol  1 drop Both Eyes BID  . sodium chloride  3 mL Intravenous Q12H   Continuous Infusions:   Principal Problem:   Asthma exacerbation Active Problems:   Chest pressure   Cough   Low grade fever   Thyroid disease   GERD (gastroesophageal reflux disease)   Depression   HLD (hyperlipidemia)   Benign essential HTN    Time spent: 35  Minutes.    [facility] Pager [phone] If 7PM-7AM, please contact night-coverage at [email or web address], password [facility] 01/03/2015, 6:13 PM  LOS: 2 days

## 2015-01-04 ENCOUNTER — Telehealth: Payer: Self-pay | Admitting: Cardiovascular Disease

## 2015-01-04 ENCOUNTER — Telehealth (HOSPITAL_COMMUNITY): Payer: Self-pay | Admitting: *Deleted

## 2015-01-04 ENCOUNTER — Inpatient Hospital Stay (HOSPITAL_COMMUNITY): Payer: Medicare Other

## 2015-01-04 ENCOUNTER — Telehealth (HOSPITAL_COMMUNITY): Payer: Self-pay

## 2015-01-04 LAB — HEMOGLOBIN A1C
Hgb A1c MFr Bld: 6.3 % — ABNORMAL HIGH (ref 4.8–5.6)
Mean Plasma Glucose: 134 mg/dL

## 2015-01-04 LAB — GLUCOSE, CAPILLARY
GLUCOSE-CAPILLARY: 126 mg/dL — AB (ref 70–99)
Glucose-Capillary: 156 mg/dL — ABNORMAL HIGH (ref 70–99)
Glucose-Capillary: 174 mg/dL — ABNORMAL HIGH (ref 70–99)
Glucose-Capillary: 150 mg/dL — ABNORMAL HIGH (ref 70–99)

## 2015-01-04 MED ORDER — BUDESONIDE-FORMOTEROL FUMARATE 160-4.5 MCG/ACT IN AERO
2.0000 | INHALATION_SPRAY | Freq: Two times a day (BID) | RESPIRATORY_TRACT | Status: DC
  Filled 2015-01-04: qty 6

## 2015-01-04 MED ORDER — PREDNISONE 50 MG PO TABS
60.0000 mg | ORAL_TABLET | Freq: Every day | ORAL | Status: DC
  Administered 2015-01-05 – 2015-01-06 (×2): 60 mg via ORAL
  Filled 2015-01-04 (×3): qty 1

## 2015-01-04 MED ORDER — IPRATROPIUM-ALBUTEROL 0.5-2.5 (3) MG/3ML IN SOLN
3.0000 mL | Freq: Three times a day (TID) | RESPIRATORY_TRACT | Status: DC
  Administered 2015-01-05 – 2015-01-06 (×5): 3 mL via RESPIRATORY_TRACT
  Filled 2015-01-04 (×5): qty 3

## 2015-01-04 MED ORDER — POLYETHYLENE GLYCOL 3350 17 G PO PACK
17.0000 g | PACK | Freq: Every day | ORAL | Status: DC
  Administered 2015-01-05: 17 g via ORAL
  Filled 2015-01-04 (×3): qty 1

## 2015-01-04 MED ORDER — ALBUTEROL SULFATE (2.5 MG/3ML) 0.083% IN NEBU: 2.5000 mg | INHALATION_SOLUTION | Freq: Four times a day (QID) | RESPIRATORY_TRACT | Status: DC | PRN

## 2015-01-04 MED ORDER — FERROUS SULFATE 300 (60 FE) MG/5ML PO SYRP
300.0000 mg | ORAL_SOLUTION | Freq: Every day | ORAL | Status: DC
  Administered 2015-01-05 – 2015-01-06 (×2): 300 mg via ORAL
  Filled 2015-01-04 (×3): qty 5

## 2015-01-04 MED ORDER — LEVOFLOXACIN 500 MG PO TABS
500.0000 mg | ORAL_TABLET | Freq: Every day | ORAL | Status: DC
  Administered 2015-01-04 – 2015-01-06 (×3): 500 mg via ORAL
  Filled 2015-01-04 (×3): qty 1

## 2015-01-04 MED ORDER — ALPRAZOLAM 0.25 MG PO TABS
0.2500 mg | ORAL_TABLET | Freq: Every day | ORAL | Status: DC
Start: 2015-01-04 — End: 2015-01-06
  Administered 2015-01-04 – 2015-01-06 (×3): 0.25 mg via ORAL
  Filled 2015-01-04 (×3): qty 1

## 2015-01-04 MED ORDER — SENNOSIDES-DOCUSATE SODIUM 8.6-50 MG PO TABS
2.0000 | ORAL_TABLET | Freq: Two times a day (BID) | ORAL | Status: DC
  Administered 2015-01-05: 2 via ORAL
  Filled 2015-01-04 (×6): qty 2

## 2015-01-04 NOTE — Progress Notes (Signed)
Patient during bedside report complained of bleeding in the nose which had started during the afternoon of 01/03/15 and the night as well. Patient did not complain of such incidence to RN during the night. No documentation in the morning (01/03/15) to also support it. Morning RN made aware, patient informed to notify morning RN if bleeding starts again.

## 2015-01-04 NOTE — Progress Notes (Signed)
Patient having shortness of breath, Respiratory therapist notified. Breathing treatment given. Patient did not complain of shortness of breath after breathing treatment. Will continue to monitor patient.

## 2015-01-04 NOTE — Telephone Encounter (Signed)
Please call,need to know if you sent her lab work to Dr Lavone Orn?

## 2015-01-04 NOTE — Care Management Note (Signed)
CARE MANAGEMENT NOTE 01/04/2015  Patient:  Suzanne Galloway, Suzanne Galloway   Account Number:  [id number]  Date Initiated:  01/04/2015  Documentation initiated by:  Takira Sherrin  Subjective/Objective Assessment:   CM following for progression and d/c planning.     Action/Plan:   Met with pt who plans to d/c to home tomorrow 01/05/2015, no d/c needs identified.   Anticipated DC Date:  01/05/2015   Anticipated DC Plan:  HOME/SELF CARE         Choice offered to / List presented to:             Status of service:  In process, will continue to follow Medicare Important Message given?  YES (If response is "NO", the following Medicare IM given date fields will be blank) Date Medicare IM given:  01/04/2015 Medicare IM given by:  Gurvir Schrom Date Additional Medicare IM given:   Additional Medicare IM given by:    Discharge Disposition:    Per UR Regulation:    If discussed at Long Length of Stay Meetings, dates discussed:    Comments:

## 2015-01-04 NOTE — Progress Notes (Signed)
[facility] PROGRESS NOTE  Suzanne Galloway Suzanne Galloway DOB: 06-12-1941 DOA: 01/01/2015 PCP: Irven Shelling, MD Interim summary: 74 year old lady admitted for asthma exacerbation. Assessment/Plan: 1. Sob, chest pressure worse with couging and headache: Admitted for asthma exacerbation.  Started on IV steroids, doxycycline . ,  Her breathing and wheezing has improved but patient reported change in sputum color from clear to green and yellow. Hence we discontinued the doxycycline and started her levaquin.  Rapid strep antigen is negative.  Resume bronchodilators. Changed pulmicort to symbicort .    2. Chest pressure: Unlikely cardiac in origin, she reports worsening with coughing. She denies any chest.  Serial enzymes negative and echo is not significant. EKG reviewed , does not show any ischemic changes, is normal sinus rhythm.   Recommended outpatient follow up with cardiology as of now.  Resume aspirin and statin.   Hypertension: Well controlled.   Anemia: Appears to be at baseline.  Her repeat hemoglobin is better around 11.1. Anemia panel obtained showed low iron levels and low ferritin levels. Will start her on iron supplements. Stool for occult blood ordered and pending.    Leukocytosis: Probably from IV steroids. She does not have fever. Continue to monitor.     Hyperglycemia:  probably from steroids. hgba1c is 6.3. Started her on SSI while on steroids.   Persistent headache: CT head ordered and is negative for acute pathology. Headache improves with pain medications.    Code Status: full code Family Communication: daughter at bedside Disposition Plan: pending, home when her asthma improves.    Consultants:  none  Procedures:  echo  Antibiotics:  Levaquin.   HPI/Subjective: Headache improved.  beathing is better , but reports feeling anxious and had an episode of sob earlier this am.   Objective: Filed Vitals:   01/04/15 1636  BP: 133/66   Pulse: 53  Temp: 98.3 F (36.8 C)  Resp: 19    Intake/Output Summary (Last 24 hours) at 01/04/15 1805 Last data filed at 01/04/15 0400  Gross per 24 hour  Intake    360 ml  Output      0 ml  Net    360 ml   Filed Weights   01/01/15 2012 01/02/15 2030 01/03/15 2212  Weight: 67.722 kg (149 lb 4.8 oz) 68.58 kg (151 lb 3.1 oz) 67.722 kg (149 lb 4.8 oz)    Exam:   General:  Alert a anxious.   Cardiovascular: s1s2 RRR.   Respiratory: scattered wheezing posteriorly, air entry fair  Abdomen: soft non tender non distended bowel sounds heard  Musculoskeletal: no pedal edema.   Data Reviewed: Basic Metabolic Panel:  Recent Labs Lab 01/01/15 1425 01/01/15 2145 01/02/15 0930 01/03/15 0625  NA 136  --  134* 137  K 4.1  --  4.0 4.0  CL 98  --  101 102  CO2 26  --  22 25  GLUCOSE 126*  --  245* 191*  BUN 10  --  12 18  CREATININE 0.90 0.99 0.92 0.86  CALCIUM 9.5  --  9.1 9.1  MG  --  1.7  --   --   PHOS  --  4.1  --   --    Liver Function Tests:  Recent Labs Lab 01/01/15 2145  AST 31  ALT 30  ALKPHOS 110  BILITOT 0.5  PROT 6.9  ALBUMIN 3.7   No results for input(s): LIPASE, AMYLASE in the last 168 hours. No results for input(s): AMMONIA in the  last 168 hours. CBC:  Recent Labs Lab 01/01/15 1425 01/01/15 2145 01/02/15 0930 01/03/15 0625  WBC 9.1 7.7 13.7* 16.4*  NEUTROABS 8.4*  --   --   --   HGB 11.7* 10.9* 10.7* 11.1*  HCT 35.9* 33.8* 32.9* 34.3*  MCV 91.1 90.9 90.6 91.2  PLT 225 228 241 267   Cardiac Enzymes:  Recent Labs Lab 01/01/15 1425 01/01/15 2145 01/02/15 0559 01/02/15 0930  TROPONINI <0.03 <0.03 <0.03 <0.03   BNP (last 3 results)  Recent Labs  01/01/15 1425  BNP 87.6    ProBNP (last 3 results) No results for input(s): PROBNP in the last 8760 hours.  CBG:  Recent Labs Lab 01/03/15 1714 01/03/15 2201 01/04/15 0819 01/04/15 1133 01/04/15 1633  GLUCAP 131* 151* 156* 174* 126*    Recent Results (from the past 240  hour(s))  Rapid strep screen     Status: None   Collection Time: 01/03/15  4:57 PM  Result Value Ref Range Status   Streptococcus, Group A Screen (Direct) NEGATIVE NEGATIVE Final    Comment: (NOTE) A Rapid Antigen test may result negative if the antigen level in the sample is below the detection level of this test. The FDA has not cleared this test as a stand-alone test therefore the rapid antigen negative result has reflexed to a Group A Strep culture.      Studies: Ct Head Wo Contrast  01/04/2015   CLINICAL DATA:  Headache and dizziness  EXAM: CT HEAD WITHOUT CONTRAST  TECHNIQUE: Contiguous axial images were obtained from the base of the skull through the vertex without intravenous contrast.  COMPARISON:  CT head 01/05/2013  FINDINGS: Ventricle size is normal. Mild atrophy which has progressed in the interval.  Chronic microvascular ischemic change in the white matter has progressed in the interval. No acute infarct. Negative for hemorrhage or mass  Chronic sinusitis.  No acute bony lesion.  IMPRESSION: Mild progression of atrophy and chronic microvascular ischemia. No acute intracranial abnormality  Chronic sinusitis.   Electronically Signed   By: Franchot Gallo M.D.   On: 01/04/2015 14:47    Scheduled Meds: . ALPRAZolam  0.25 mg Oral Daily  . aspirin EC  81 mg Oral Daily  . atorvastatin  10 mg Oral q1800  . benzonatate  200 mg Oral TID  . bisoprolol  10 mg Oral Daily  . budesonide-formoterol  2 puff Inhalation BID  . chlorpheniramine-HYDROcodone  5 mL Oral Q12H  . escitalopram  20 mg Oral Daily  . [START ON 01/05/2015] ferrous sulfate  300 mg Oral Q breakfast  . fexofenadine  180 mg Oral Daily  . fluticasone  1 spray Each Nare Daily  . heparin  5,000 Units Subcutaneous 3 times per day  . insulin aspart  0-9 Units Subcutaneous TID WC  . ipratropium-albuterol  3 mL Nebulization QID  . levofloxacin  500 mg Oral Daily  . levothyroxine  88 mcg Oral QAC breakfast  . pantoprazole  40  mg Oral BID  . polyethylene glycol  17 g Oral Daily  . polyvinyl alcohol  1 drop Both Eyes BID  . [START ON 01/05/2015] predniSONE  60 mg Oral Q breakfast  . senna-docusate  2 tablet Oral BID  . sodium chloride  3 mL Intravenous Q12H   Continuous Infusions:   Principal Problem:   Asthma exacerbation Active Problems:   Chest pressure   Cough   Low grade fever   Thyroid disease   GERD (gastroesophageal reflux disease)  Depression   HLD (hyperlipidemia)   Benign essential HTN    Time spent: 25  Minutes.    [facility] Pager [phone] If 7PM-7AM, please contact night-coverage at [email or web address], password [facility] 01/04/2015, 6:05 PM  LOS: 3 days

## 2015-01-05 LAB — CBC
HEMATOCRIT: 33.4 % — AB (ref 36.0–46.0)
Hemoglobin: 10.7 g/dL — ABNORMAL LOW (ref 12.0–15.0)
MCH: 29.2 pg (ref 26.0–34.0)
MCHC: 32 g/dL (ref 30.0–36.0)
MCV: 91.3 fL (ref 78.0–100.0)
Platelets: 244 10*3/uL (ref 150–400)
RBC: 3.66 MIL/uL — ABNORMAL LOW (ref 3.87–5.11)
RDW: 13.6 % (ref 11.5–15.5)
WBC: 11.3 10*3/uL — ABNORMAL HIGH (ref 4.0–10.5)

## 2015-01-05 LAB — GLUCOSE, CAPILLARY
GLUCOSE-CAPILLARY: 97 mg/dL (ref 70–99)
Glucose-Capillary: 156 mg/dL — ABNORMAL HIGH (ref 70–99)
Glucose-Capillary: 212 mg/dL — ABNORMAL HIGH (ref 70–99)

## 2015-01-05 LAB — OCCULT BLOOD X 1 CARD TO LAB, STOOL: Fecal Occult Bld: NEGATIVE

## 2015-01-05 NOTE — Progress Notes (Signed)
[facility] PROGRESS NOTE  Suzanne Galloway DVV:[id number] DOB: 21-Jul-1941 DOA: 01/01/2015 PCP: Irven Shelling, MD Interim summary: 74 year old lady admitted for asthma exacerbation. Assessment/Plan: 1. Sob, chest pressure worse with couging and headache: Admitted for asthma exacerbation.  Started on IV steroids, later transitioned to po prednisone starting today. Her wheezing has improved. recommend watching her 24 more hours  And plan for discharge in am after PT eval and check ambulating oxygen sats.  Rapid strep antigen is negative.  Resume bronchodilators.    2. Chest pressure:  She denies any chest pain today,.  Serial enzymes negative and echo is not significant. EKG reviewed , does not show any ischemic changes, EKG is normal sinus rhythm.   Recommended outpatient follow up with cardiology. SCHEDULED to 29 th of this month.  Resume aspirin and statin.   Hypertension: Well controlled.   Anemia: Appears to be at baseline.  Her repeat hemoglobin is better around 11.1. Anemia panel obtained showed low iron levels and low ferritin levels. Will start her on iron supplements. Stool for occult blood ordered and pending.    Leukocytosis: Probably from IV steroids. She does not have fever.  Her leukocytosis is improving .Continue to monitor.     Hyperglycemia:  probably from steroids. hgba1c is 6.3. Started her on SSI while on steroids.   Persistent headache: CT head ordered and is negative for acute pathology. Headache improves with pain medications.    Code Status: full code Family Communication: daughter at bedside Disposition Plan: pending, home tomorrow.    Consultants:  none  Procedures:  echo  Antibiotics:  Levaquin.   Subjective: She reorts being anxious and we have started her on xanax.   Objective: Filed Vitals:   01/05/15 1853  BP: 154/67  Pulse: 66  Temp: 98.5 F (36.9 C)  Resp: 18    Intake/Output Summary (Last 24 hours) at 01/05/15  1856 Last data filed at 01/05/15 1725  Gross per 24 hour  Intake   1563 ml  Output      6 ml  Net   1557 ml   Filed Weights   01/02/15 2030 01/03/15 2212 01/04/15 2123  Weight: 68.58 kg (151 lb 3.1 oz) 67.722 kg (149 lb 4.8 oz) 68.1 kg (150 lb 2.1 oz)    Exam:   General:  Alert and more comfortable today. .   Cardiovascular: s1s2 RRR.   Respiratory: bilateral wheezing heard today.   Abdomen: soft non tender non distended bowel sounds heard  Musculoskeletal: no pedal edema.   Data Reviewed: Basic Metabolic Panel:  Recent Labs Lab 01/01/15 1425 01/01/15 2145 01/02/15 0930 01/03/15 0625  NA 136  --  134* 137  K 4.1  --  4.0 4.0  CL 98  --  101 102  CO2 26  --  22 25  GLUCOSE 126*  --  245* 191*  BUN 10  --  12 18  CREATININE 0.90 0.99 0.92 0.86  CALCIUM 9.5  --  9.1 9.1  MG  --  1.7  --   --   PHOS  --  4.1  --   --    Liver Function Tests:  Recent Labs Lab 01/01/15 2145  AST 31  ALT 30  ALKPHOS 110  BILITOT 0.5  PROT 6.9  ALBUMIN 3.7   No results for input(s): LIPASE, AMYLASE in the last 168 hours. No results for input(s): AMMONIA in the last 168 hours. CBC:  Recent Labs Lab 01/01/15 1425 01/01/15 2145 01/02/15  0930 01/03/15 0625 01/05/15 0417  WBC 9.1 7.7 13.7* 16.4* 11.3*  NEUTROABS 8.4*  --   --   --   --   HGB 11.7* 10.9* 10.7* 11.1* 10.7*  HCT 35.9* 33.8* 32.9* 34.3* 33.4*  MCV 91.1 90.9 90.6 91.2 91.3  PLT 225 228 241 267 244   Cardiac Enzymes:  Recent Labs Lab 01/01/15 1425 01/01/15 2145 01/02/15 0559 01/02/15 0930  TROPONINI <0.03 <0.03 <0.03 <0.03   BNP (last 3 results)  Recent Labs  01/01/15 1425  BNP 87.6    ProBNP (last 3 results) No results for input(s): PROBNP in the last 8760 hours.  CBG:  Recent Labs Lab 01/04/15 1133 01/04/15 1633 01/04/15 2122 01/05/15 0759 01/05/15 1206  GLUCAP 174* 126* 150* 97 156*    Recent Results (from the past 240 hour(s))  Rapid strep screen     Status: None    Collection Time: 01/03/15  4:57 PM  Result Value Ref Range Status   Streptococcus, Group A Screen (Direct) NEGATIVE NEGATIVE Final    Comment: (NOTE) A Rapid Antigen test may result negative if the antigen level in the sample is below the detection level of this test. The FDA has not cleared this test as a stand-alone test therefore the rapid antigen negative result has reflexed to a Group A Strep culture.      Studies: Ct Head Wo Contrast  01/04/2015   CLINICAL DATA:  Headache and dizziness  EXAM: CT HEAD WITHOUT CONTRAST  TECHNIQUE: Contiguous axial images were obtained from the base of the skull through the vertex without intravenous contrast.  COMPARISON:  CT head 01/05/2013  FINDINGS: Ventricle size is normal. Mild atrophy which has progressed in the interval.  Chronic microvascular ischemic change in the white matter has progressed in the interval. No acute infarct. Negative for hemorrhage or mass  Chronic sinusitis.  No acute bony lesion.  IMPRESSION: Mild progression of atrophy and chronic microvascular ischemia. No acute intracranial abnormality  Chronic sinusitis.   Electronically Signed   By: Franchot Gallo M.D.   On: 01/04/2015 14:47    Scheduled Meds: . ALPRAZolam  0.25 mg Oral Daily  . aspirin EC  81 mg Oral Daily  . atorvastatin  10 mg Oral q1800  . benzonatate  200 mg Oral TID  . bisoprolol  10 mg Oral Daily  . budesonide-formoterol  2 puff Inhalation BID  . chlorpheniramine-HYDROcodone  5 mL Oral Q12H  . escitalopram  20 mg Oral Daily  . ferrous sulfate  300 mg Oral Q breakfast  . fexofenadine  180 mg Oral Daily  . fluticasone  1 spray Each Nare Daily  . heparin  5,000 Units Subcutaneous 3 times per day  . insulin aspart  0-9 Units Subcutaneous TID WC  . ipratropium-albuterol  3 mL Nebulization TID  . levofloxacin  500 mg Oral Daily  . levothyroxine  88 mcg Oral QAC breakfast  . pantoprazole  40 mg Oral BID  . polyethylene glycol  17 g Oral Daily  . polyvinyl  alcohol  1 drop Both Eyes BID  . predniSONE  60 mg Oral Q breakfast  . senna-docusate  2 tablet Oral BID  . sodium chloride  3 mL Intravenous Q12H   Continuous Infusions:   Principal Problem:   Asthma exacerbation Active Problems:   Chest pressure   Cough   Low grade fever   Thyroid disease   GERD (gastroesophageal reflux disease)   Depression   HLD (hyperlipidemia)   Benign essential  HTN    Time spent: 25  Minutes.    [facility] Pager [phone] If 7PM-7AM, please contact night-coverage at [email or web address], password [facility] 01/05/2015, 6:56 PM  LOS: 4 days

## 2015-01-06 LAB — GLUCOSE, CAPILLARY
GLUCOSE-CAPILLARY: 171 mg/dL — AB (ref 70–99)
Glucose-Capillary: 117 mg/dL — ABNORMAL HIGH (ref 70–99)

## 2015-01-06 LAB — CULTURE, GROUP A STREP: Strep A Culture: NEGATIVE

## 2015-01-06 MED ORDER — FEXOFENADINE HCL 180 MG PO TABS: 180.0000 mg | ORAL_TABLET | Freq: Every day | ORAL | Status: DC

## 2015-01-06 MED ORDER — IPRATROPIUM-ALBUTEROL 0.5-2.5 (3) MG/3ML IN SOLN: 3.0000 mL | Freq: Four times a day (QID) | RESPIRATORY_TRACT | Status: DC | PRN

## 2015-01-06 MED ORDER — FERROUS SULFATE 300 (60 FE) MG/5ML PO SYRP: 300.0000 mg | ORAL_SOLUTION | Freq: Every day | ORAL | Status: DC

## 2015-01-06 MED ORDER — GUAIFENESIN 100 MG/5ML PO SYRP: 200.0000 mg | ORAL_SOLUTION | ORAL | Status: DC | PRN

## 2015-01-06 MED ORDER — BENZONATATE 200 MG PO CAPS: 200.0000 mg | ORAL_CAPSULE | Freq: Three times a day (TID) | ORAL | Status: DC

## 2015-01-06 MED ORDER — BUDESONIDE-FORMOTEROL FUMARATE 160-4.5 MCG/ACT IN AERO: 2.0000 | INHALATION_SPRAY | Freq: Two times a day (BID) | RESPIRATORY_TRACT | Status: DC

## 2015-01-06 MED ORDER — PREDNISONE 20 MG PO TABS: ORAL_TABLET | ORAL | Status: DC

## 2015-01-06 NOTE — Progress Notes (Signed)
Pt provided with discharge information including information on follow up appointments and medications to take. Pt also provided with nebulizer. Pt and family at bedside verbalized understanding of all information. Pt's IV was dc'd without complication. VSS. Pt escorted out via wheelchair by this RN.   Tyna Jaksch, RN

## 2015-01-06 NOTE — Evaluation (Signed)
Physical Therapy Evaluation Patient Details Name: Suzanne Galloway MRN: [id number] DOB: 01-26-1941 Today's Date: 01/06/2015   History of Present Illness  HPI: Suzanne Galloway is a 74 y.o. female with past medical history significant for thyroid disease, asthma, GERD, essential hypertension, hyperlipidemia and history of mitral valve prolapse; who presented to the ED with complaints of approximately 2 weeks shortness of breath, cough and general malaise. HPI: Suzanne Galloway is a 74 y.o. female with past medical history significant for thyroid disease, asthma, GERD, essential hypertension, hyperlipidemia and history of mitral valve prolapse; who presented to the ED with complaints of approximately 2 weeks shortness of breath, cough and general malaise.   Past Medical History  Diagnosis Date  . Mitral valve prolapse   . GERD (gastroesophageal reflux disease)   . Depression   . Asthma   . Hyperlipemia   . Hypothyroidism   . High cholesterol   . Chronic bronchitis     "get it just about q yr" (01/01/2015)  . Arthritis     "hands, back, toes" (01/01/2015)  . Urinary incontinence    Past Surgical History  Procedure Laterality Date  . Toe surgery Left     "took bone out of 2nd toe; took cyst out"  . Back surgery    . Incontinence surgery    . Carpal tunnel release Bilateral   . Laparoscopic cholecystectomy    . Lumbar laminectomy  1987; 12/2009    ; Archie Endo 01/16/2010  . Cystectomy Right 09/2014    "hand"   . Finger surgery Bilateral     "scraped arthritis out of thumbs"  . Vaginal hysterectomy  1970  . Dilation and curettage of uterus    . Tubal ligation       Clinical Impression   Patient evaluated by Physical Therapy with no further acute PT needs identified. All education has been completed and the patient has no further questions.  See below for any follow-up Physical Therapy or equipment needs. PT is signing off. Thank you for this referral.  Walked on [employer] and sats stayed greater than  or equal to 95%     Follow Up Recommendations No PT follow up    Equipment Recommendations  None recommended by PT    Recommendations for Other Services       Precautions / Restrictions        Mobility  Bed Mobility Overal bed mobility: Independent                Transfers Overall transfer level: Independent                  Ambulation/Gait Ambulation/Gait assistance: Independent Ambulation Distance (Feet): 500 Feet Assistive device: None Gait Pattern/deviations: WFL(Within Functional Limits) Gait velocity: WFL   General Gait Details: Good self-monitor for activity tolerance  Stairs            Wheelchair Mobility    Modified Rankin (Stroke Patients Only)       Balance                                             Pertinent Vitals/Pain Pain Assessment: No/denies pain    Home Living Family/patient expects to be discharged to:: Private residence Living Arrangements: Alone Available Help at Discharge: Family;Friend(s);Available PRN/intermittently Type of Home: House       Home Layout: One level  Home Equipment: None      Prior Function Level of Independence: Independent         Comments: drives, volunteeers     Hand Dominance        Extremity/Trunk Assessment   Upper Extremity Assessment: Overall WFL for tasks assessed           Lower Extremity Assessment: Overall WFL for tasks assessed      Cervical / Trunk Assessment: Normal  Communication   Communication: No difficulties  Cognition Arousal/Alertness: Awake/alert Behavior During Therapy: WFL for tasks assessed/performed Overall Cognitive Status: Within Functional Limits for tasks assessed                      General Comments General comments (skin integrity, edema, etc.): Walked on [employer] and sats stayed greater than or equal to 95%    Exercises        Assessment/Plan    PT Assessment Patent does not need any further  PT services  PT Diagnosis Generalized weakness (decr functional capacity)   PT Problem List    PT Treatment Interventions     PT Goals (Current goals can be found in the Care Plan section) Acute Rehab PT Goals Patient Stated Goal: hopes to get home today PT Goal Formulation: All assessment and education complete, DC therapy    Frequency     Barriers to discharge        Co-evaluation               End of Session Equipment Utilized During Treatment:  (pulse oximeter) Activity Tolerance: Patient tolerated treatment well Patient left: in bed;with call bell/phone within reach Nurse Communication: Mobility status         Time: 4193-7902 PT Time Calculation (min) (ACUTE ONLY): 20 min   Charges:   PT Evaluation $Initial PT Evaluation Tier I: 1 Procedure     PT G CodesRoney Marion Hamff 01/06/2015, 12:16 PM  Roney Marion, [place] Pager [phone] Office [phone]

## 2015-01-06 NOTE — Care Management Note (Signed)
    Page 1 of 1   01/06/2015     3:48:45 PM CARE MANAGEMENT NOTE 01/06/2015  Patient:  VASTI, YAGI   Account Number:  [id number]  Date Initiated:  01/04/2015  Documentation initiated by:  ROYAL,CHERYL  Subjective/Objective Assessment:   CM following for progression and d/c planning.     Action/Plan:   Met with pt who plans to d/c to home tomorrow 01/05/2015, no d/c needs identified.   Anticipated DC Date:  01/05/2015   Anticipated DC Plan:  [facility]  CM consult      Choice offered to / List presented to:     DME arranged  NEBULIZER MACHINE      DME agency  [place].        Status of service:  Completed, signed off Medicare Important Message given?  YES (If response is "NO", the following Medicare IM given date fields will be blank) Date Medicare IM given:  01/04/2015 Medicare IM given by:  ROYAL,CHERYL Date Additional Medicare IM given:   Additional Medicare IM given by:    Discharge Disposition:  HOME/SELF CARE  Per UR Regulation:    If discussed at Long Length of Stay Meetings, dates discussed:    Comments:  01/06/15 15:45 CM received call from RN requesting nebulizer machine be delivered to room ASAP.  Cm called [facility] DME rep, Jeneen Rinks and left message to please deliver neb machine as soon as possible for pt discharge.  No other Cm needs were communicated.  Mariane Masters, BSn, CM [phone].

## 2015-01-08 ENCOUNTER — Ambulatory Visit (HOSPITAL_COMMUNITY): Payer: Medicare Other

## 2015-01-08 ENCOUNTER — Encounter (HOSPITAL_COMMUNITY): Payer: Medicare Other

## 2015-01-10 ENCOUNTER — Telehealth (HOSPITAL_COMMUNITY): Payer: Self-pay

## 2015-01-10 NOTE — Telephone Encounter (Signed)
Encounter complete. 

## 2015-01-14 ENCOUNTER — Telehealth (HOSPITAL_COMMUNITY): Payer: Self-pay | Admitting: *Deleted

## 2015-01-15 ENCOUNTER — Encounter (HOSPITAL_COMMUNITY): Payer: Medicare Other

## 2015-01-15 NOTE — Telephone Encounter (Signed)
-----   Message from Troy Sine, MD sent at 01/12/2015 11:09 AM EDT ----- Increase atorvastatin to 20 mg; add vascepa 2 capsules daily; reduce carbs/sweets

## 2015-01-16 ENCOUNTER — Other Ambulatory Visit: Payer: Self-pay | Admitting: *Deleted

## 2015-01-16 ENCOUNTER — Telehealth: Payer: Self-pay | Admitting: *Deleted

## 2015-01-16 MED ORDER — ATORVASTATIN CALCIUM 20 MG PO TABS: 20.0000 mg | ORAL_TABLET | Freq: Every day | ORAL | Status: DC

## 2015-01-16 MED ORDER — ICOSAPENT ETHYL 1 G PO CAPS: 2.0000 | ORAL_CAPSULE | Freq: Every day | ORAL | Status: DC

## 2015-01-16 NOTE — Telephone Encounter (Signed)
-----   Message from Troy Sine, MD sent at 01/12/2015 11:09 AM EDT ----- Increase atorvastatin to 20 mg; add vascepa 2 capsules daily; reduce carbs/sweets

## 2015-01-16 NOTE — Progress Notes (Signed)
Labs routed to Dr. Lavone Orn per patient request.

## 2015-01-16 NOTE — Telephone Encounter (Signed)
Left message of labs results and recommendations. vascepa and atorvastatin 20 mg sent to patient's pharmacy.

## 2015-01-16 NOTE — Discharge Summary (Signed)
Physician Discharge Summary  Suzanne Galloway EVO:[id number] DOB: Nov 14, 1940 DOA: 01/01/2015  PCP: Irven Shelling, MD  Admit date: 01/01/2015 Discharge date: 01/06/2015  Time spent: 30 minutes  Recommendations for Outpatient Follow-up:  1. Follow upw ith PCP and cardiology as recommended  Discharge Diagnoses:  Principal Problem:   Asthma exacerbation Active Problems:   Chest pressure   Cough   Low grade fever   Thyroid disease   GERD (gastroesophageal reflux disease)   Depression   HLD (hyperlipidemia)   Benign essential HTN   Discharge Condition: improved  Diet recommendation: low sodium diet  Filed Weights   01/02/15 2030 01/03/15 2212 01/04/15 2123  Weight: 68.58 kg (151 lb 3.1 oz) 67.722 kg (149 lb 4.8 oz) 68.1 kg (150 lb 2.1 oz)    History of present illness:  74 year old lady admitted for asthma exacerbation.  Hospital Course:  1. Sob, chest pressure worse with couging and headache: Admitted for asthma exacerbation.  Started on IV steroids, later transitioned to po prednisone starting today. Her wheezing has improved. Discharge home on oral steroids to taper. Rapid strep antigen is negative.  Resume bronchodilators.    2. Chest pressure: She denies any chest pain today,.  Serial enzymes negative and echo is not significant. EKG reviewed , does not show any ischemic changes, EKG is normal sinus rhythm.  Recommended outpatient follow up with cardiology. SCHEDULED to 29 th of this month.  Resume aspirin and statin.   Hypertension: Well controlled.   Anemia: Appears to be at baseline.  Her repeat hemoglobin is better around 11.1. Anemia panel obtained showed low iron levels and low ferritin levels. Will start her on iron supplements.    Leukocytosis: Probably from IV steroids. She does not have fever. Her leukocytosis is improving .Continue to monitor.     Hyperglycemia:  probably from steroids. hgba1c is 6.3. Started her on SSI while on  steroids. Can d/c on ondischarge.   Persistent headache: CT head ordered and is negative for acute pathology. Headache improves with pain medications.   Procedures:  none  Consultations:  none  Discharge Exam: Filed Vitals:   01/06/15 0935  BP: 170/73  Pulse: 57  Temp: 98 F (36.7 C)  Resp: 18    General: alert afebrile comfortable Cardiovascular: s1s2 Respiratory: ctab  Discharge Instructions   Discharge Instructions    Discharge instructions    Complete by:  As directed   Follow up with PCP in 2 weeks.  Follow up with Dr Melvyn Novas in 1 to 2 weeks.          Discharge Medication List as of 01/06/2015  3:17 PM    START taking these medications   Details  benzonatate (TESSALON) 200 MG capsule Take 1 capsule (200 mg total) by mouth 3 (three) times daily., Starting 01/06/2015, Until Discontinued, Print    ferrous sulfate 300 (60 FE) MG/5ML syrup Take 5 mLs (300 mg total) by mouth daily with breakfast., Starting 01/06/2015, Until Discontinued, Print    fexofenadine (ALLEGRA) 180 MG tablet Take 1 tablet (180 mg total) by mouth daily., Starting 01/06/2015, Until Discontinued, Print    guaifenesin (ROBITUSSIN) 100 MG/5ML syrup Take 10 mLs (200 mg total) by mouth every 4 (four) hours as needed for congestion., Starting 01/06/2015, Until Discontinued, Print    ipratropium-albuterol (DUONEB) 0.5-2.5 (3) MG/3ML SOLN Take 3 mLs by nebulization every 6 (six) hours as needed., Starting 01/06/2015, Until Discontinued, Print    predniSONE (DELTASONE) 20 MG tablet Prednisone 40 mg daily for  3 days followed by Prednisone 20 mg daily for 3 days and stop., Print      CONTINUE these medications which have CHANGED   Details  budesonide-formoterol (SYMBICORT) 160-4.5 MCG/ACT inhaler Inhale 2 puffs into the lungs 2 (two) times daily., Starting 01/06/2015, Until Discontinued, Print      CONTINUE these medications which have NOT CHANGED   Details  albuterol (PROVENTIL HFA;VENTOLIN HFA) 108 (90  BASE) MCG/ACT inhaler Inhale 2 puffs into the lungs every 4 (four) hours as needed for wheezing., Starting 01/05/2013, Until Discontinued, Print    atorvastatin (LIPITOR) 10 MG tablet Take 10 mg by mouth daily at 6 PM. , Until Discontinued, Historical Med    Calcium-Vitamin D 500-125 MG-UNIT TABS Take 1 tablet by mouth daily., Until Discontinued, Historical Med    cholecalciferol (VITAMIN D) 1000 UNITS tablet Take 1,000 Units by mouth daily., Until Discontinued, Historical Med    cyanocobalamin 1000 MCG tablet Take 1,000 mcg by mouth daily., Until Discontinued, Historical Med    escitalopram (LEXAPRO) 20 MG tablet Take 20 mg by mouth daily., Until Discontinued, Historical Med    !! levothyroxine (SYNTHROID, LEVOTHROID) 75 MCG tablet 1 tablet every mon, wed, fri, Until Discontinued, Historical Med    !! levothyroxine (SYNTHROID, LEVOTHROID) 88 MCG tablet 1 tablet every tues, thurs, sat, sun, Until Discontinued, Historical Med    loratadine (CLARITIN) 10 MG tablet Take 10 mg by mouth daily as needed for allergies. , Until Discontinued, Historical Med    metoprolol succinate (TOPROL-XL) 50 MG 24 hr tablet Take 50 mg by mouth daily. Take with or immediately following a meal., Until Discontinued, Historical Med    omeprazole (PRILOSEC) 40 MG capsule Take 40 mg by mouth 2 (two) times daily. , Until Discontinued, Historical Med    Polyvinyl Alcohol-Povidone (REFRESH OP) Place 1 drop into both eyes daily., Until Discontinued, Historical Med     !! - Potential duplicate medications found. Please discuss with provider.    STOP taking these medications     guaiFENesin (MUCINEX) 600 MG 12 hr tablet      naproxen sodium (ANAPROX) 220 MG tablet      budesonide-formoterol (SYMBICORT) 80-4.5 MCG/ACT inhaler        Allergies  Allergen Reactions  . Lactulose Diarrhea  . Morphine And Related Nausea And Vomiting  . Percocet [Oxycodone-Acetaminophen] Other (See Comments)    hallucinations  .  Vesicare [Solifenacin]     constipation   Follow-up Information    Follow up with Irven Shelling, MD. Schedule an appointment as soon as possible for a visit in 2 weeks.   Specialty:  Internal Medicine   Contact information:   [address] [place]  [address] [phone]       Follow up with Christinia Gully, MD In 1 week.   Specialty:  Pulmonary Disease   Contact information:   [address] [address] [phone]       Follow up with [facility].   Why:  nebulizer machine   Contact information:   [address] [place] [address] [phone]        The results of significant diagnostics from this hospitalization (including imaging, microbiology, ancillary and laboratory) are listed below for reference.    Significant Diagnostic Studies: Dg Chest 2 View  01/01/2015   CLINICAL DATA:  Chest pain and difficulty breathing  EXAM: CHEST  2 VIEW  COMPARISON:  January 05, 2013  FINDINGS: There is no  edema or consolidation. The heart size and pulmonary vascularity are normal. No adenopathy. No pneumothorax. No bone lesions.  IMPRESSION: No edema or consolidation.   Electronically Signed   By: Lowella Grip III M.D.   On: 01/01/2015 13:22   Ct Head Wo Contrast  01/04/2015   CLINICAL DATA:  Headache and dizziness  EXAM: CT HEAD WITHOUT CONTRAST  TECHNIQUE: Contiguous axial images were obtained from the base of the skull through the vertex without intravenous contrast.  COMPARISON:  CT head 01/05/2013  FINDINGS: Ventricle size is normal. Mild atrophy which has progressed in the interval.  Chronic microvascular ischemic change in the white matter has progressed in the interval. No acute infarct. Negative for hemorrhage or mass  Chronic sinusitis.  No acute bony lesion.  IMPRESSION: Mild progression of atrophy and chronic microvascular ischemia. No acute intracranial abnormality  Chronic sinusitis.   Electronically Signed   By:  Franchot Gallo M.D.   On: 01/04/2015 14:47    Microbiology: No results found for this or any previous visit (from the past 240 hour(s)).   Labs: Basic Metabolic Panel: No results for input(s): NA, K, CL, CO2, GLUCOSE, BUN, CREATININE, CALCIUM, MG, PHOS in the last 168 hours. Liver Function Tests: No results for input(s): AST, ALT, ALKPHOS, BILITOT, PROT, ALBUMIN in the last 168 hours. No results for input(s): LIPASE, AMYLASE in the last 168 hours. No results for input(s): AMMONIA in the last 168 hours. CBC: No results for input(s): WBC, NEUTROABS, HGB, HCT, MCV, PLT in the last 168 hours. Cardiac Enzymes: No results for input(s): CKTOTAL, CKMB, CKMBINDEX, TROPONINI in the last 168 hours. BNP: BNP (last 3 results)  Recent Labs  01/01/15 1425  BNP 87.6    ProBNP (last 3 results) No results for input(s): PROBNP in the last 8760 hours.  CBG: No results for input(s): GLUCAP in the last 168 hours.     SignedHosie Poisson  [facility] 01/16/2015, 10:04 AM

## 2015-01-18 ENCOUNTER — Telehealth: Payer: Self-pay | Admitting: Cardiovascular Disease

## 2015-01-18 NOTE — Telephone Encounter (Signed)
Dr. Claiborne Billings spoke with caller and gave verball order to cancel patient's scheduled stress test. Note will be routed to schedulers to do this.

## 2015-01-20 NOTE — Telephone Encounter (Signed)
agree

## 2015-01-24 ENCOUNTER — Ambulatory Visit (INDEPENDENT_AMBULATORY_CARE_PROVIDER_SITE_OTHER): Payer: Medicare Other | Admitting: Internal Medicine

## 2015-01-24 ENCOUNTER — Encounter: Payer: Self-pay | Admitting: Internal Medicine

## 2015-01-24 VITALS — BP 140/62 | HR 66 | Ht 62.5 in | Wt 151.2 lb

## 2015-01-24 DIAGNOSIS — J45991 Cough variant asthma: Secondary | ICD-10-CM

## 2015-01-24 MED ORDER — MOMETASONE FURO-FORMOTEROL FUM 100-5 MCG/ACT IN AERO: INHALATION_SPRAY | RESPIRATORY_TRACT | Status: DC

## 2015-01-24 MED ORDER — PANTOPRAZOLE SODIUM 40 MG PO TBEC: 40.0000 mg | DELAYED_RELEASE_TABLET | Freq: Every day | ORAL | Status: DC

## 2015-01-24 MED ORDER — FAMOTIDINE 20 MG PO TABS
ORAL_TABLET | ORAL | Status: DC
Start: 2015-01-24 — End: 2018-11-16

## 2015-01-24 NOTE — Progress Notes (Signed)
Subjective:    Patient ID: Suzanne Galloway, female    DOB: [birth year]   MRN: [id number]   Brief patient profile:  74 yowf never smoker with dx of recurrent bronchitis yearly starting around 2005 typically around November and better with zpak/predisone referred 06/16/2013 by Dr Lavone Orn for sob > cough onset March 2014   HPI 06/16/2013 1st [facility] office visit/ Wert cc sob > cough  X 5 months better with inhalers then flare again when they wear off, best one is albuterol and no better on symbicort so changed to dulera x sev days prior to OV ( But hfa nearly 0% at initial ov ) and not effective yet. Sob x > slow adls  rec Change Symbicort  To 80 strength and  Take 2 puffs first thing in am and then another 2 puffs about 12 hours later.  Hold the dulera for now Only use your albuterol (ventolin) as a rescue medication  Continue prilosec Take 30-60 min before first meal of the day and pepcid ac mg at bedtime  GERD  Diet  Work on inhaler technique    06/27/2013 f/u ov/Wert    Chief Complaint  Patient presents with  . Follow-up    Breathing and cough have improved some. She is having some cough this am- non prod and she relates to the damp weather. She has not had to use rescue inhaler since her last visit.   much better at hfa but still on pred ? 40  ? Daily.  rec Continue symbicort 80  Take 2 puffs first thing in am and then another 2 puffs about 12 hours later and keep working on perfecting your inhaler technique Only use your albuterol as a rescue medication to be used if you can't catch your breath by resting or doing a relaxed purse lip breathing pattern. The less you use it, the better it will work when you need it.  Goal is less than twice weekly Taper off the prednisone as per Dr Laurann Montana and return if flare off it.   Admit date: 01/01/2015 Discharge date: 01/06/2015    Discharge Diagnoses:  Principal Problem:  Asthma exacerbation Active Problems:  Chest pressure   Cough  Low grade fever  Thyroid disease  GERD (gastroesophageal reflux disease)  Depression  HLD (hyperlipidemia)  Benign essential HTN   Discharge Condition: improved  Diet recommendation: low sodium diet  Filed Weights   01/02/15 2030 01/03/15 2212 01/04/15 2123  Weight: 68.58 kg (151 lb 3.1 oz) 67.722 kg (149 lb 4.8 oz) 68.1 kg (150 lb 2.1 oz)    History of present illness:  74 year old lady admitted for asthma exacerbation.  Hospital Course:  1. Sob, chest pressure worse with couging and headache: Admitted for asthma exacerbation.  Started on IV steroids, later transitioned to po prednisone starting today. Her wheezing has improved. Discharge home on oral steroids to taper. Rapid strep antigen is negative.  Resume bronchodilators.    2. Chest pressure: She denies any chest pain today,.  Serial enzymes negative and echo is not significant. EKG reviewed , does not show any ischemic changes, EKG is normal sinus rhythm.  Recommended outpatient follow up with cardiology. SCHEDULED to 29 th of this month.  Resume aspirin and statin.   Hypertension: Well controlled.   Anemia: Appears to be at baseline.  Her repeat hemoglobin is better around 11.1. Anemia panel obtained showed low iron levels and low ferritin levels. Will start her on iron supplements.  Leukocytosis: Probably from IV steroids. She does not have fever. Her leukocytosis is improving .Continue to monitor.     Hyperglycemia:  probably from steroids. hgba1c is 6.3. Started her on SSI while on steroids. Can d/c on ondischarge.   Persistent headache: CT head ordered and is negative for acute pathology. Headache improves with pain medications.         01/24/2015   ov/Wert re: post hosp f/u  Chief Complaint  Patient presents with  . HFU    Pt was hospitalized from 01/01/15-01/06/15 for asthma exacerbation. She feels that her breathing is better, but not back to normal  baseline. She is coughing more, non prod and worse at night.  She states that she is using proair 2 x per day on average.    off prednisone for months and just took singulair / symbicort and rarely ventolin then had R hand surgery in Jan 2016 then a week or two later started dry cough quit harsh and severe to point of incont but no vomiting, worse after supper and noct on prilosec before breakfastx 20 mg     No obvious day to day or  daytime variabilty or assoc   cp or chest tightness, subjective wheeze overt sinus or hb symptoms. No unusual exp hx or h/o childhood pna/ asthma or knowledge of premature birth.   Sleeping ok without nocturnal  or early am exacerbation  of respiratory  c/o's or need for noct saba. Also denies any obvious fluctuation of symptoms with weather or environmental changes or other aggravating or alleviating factors except as outlined above   Current Medications, Allergies, Complete Past Medical History, Past Surgical History, Family History, and Social History were reviewed in [employer] record.  ROS  The following are not active complaints unless bolded sore throat, dysphagia, dental problems, itching, sneezing,  nasal congestion or excess/ purulent secretions, ear ache,   fever, chills, sweats, unintended wt loss, pleuritic or exertional cp, hemoptysis,  orthopnea pnd or leg swelling, presyncope, palpitations, heartburn, abdominal pain, anorexia, nausea, vomiting, diarrhea  or change in bowel or urinary habits, change in stools or urine, dysuria,hematuria,  rash, arthralgias, visual complaints, headache, numbness weakness or ataxia or problems with walking or coordination,  change in mood/affect or memory.           Objective:   Physical Exam    amb wf nad   01/24/2015  Wt Readings from Last 3 Encounters:  06/27/13 151 lb 12.8 oz (68.856 kg)  06/16/13 150 lb (68.04 kg)  01/05/13 150 lb (68.04 kg)     HEENT: Full upper denture , partial  lower  turbinates, and orophanx. Nl external ear canals without cough reflex   NECK :  without JVD/Nodes/TM/ nl carotid upstrokes bilaterally   LUNGS: no acc muscle use, clear to A and P bilaterally with cough at end exp    CV:  RRR  no s3 or murmur or increase in P2, no edema   ABD:  soft and nontender with nl excursion in the supine position. No bruits or organomegaly, bowel sounds nl  MS:  warm without deformities, calf tenderness, cyanosis or clubbing  SKIN: warm and dry without lesions    NEURO:  alert, approp, no deficits     I personally reviewed images and agree with radiology impression as follows:  CXR:  01/01/15  No edema or consolidation.         Assessment & Plan:

## 2015-01-24 NOTE — Patient Instructions (Addendum)
Stop symbicort and try dulera 100 Take 2 puffs first thing in am and then another 2 puffs about 12 hours later.   Pantoprazole (protonix) 40 mg   Take 30-60 min before first meal of the day and Pepcid ac 20 mg one bedtime until return to office - this is the best way to tell whether stomach acid is contributing to your problem.    GERD (REFLUX)  is an extremely common cause of respiratory symptoms just like yours , many times with no obvious heartburn at all.    It can be treated with medication, but also with lifestyle changes including elevation of head of bed, avoidance of late meals, excessive alcohol, smoking cessation, and avoid fatty foods, chocolate, peppermint, colas, red wine, and acidic juices such as orange juice.  NO MINT OR MENTHOL PRODUCTS SO NO COUGH DROPS  USE SUGARLESS CANDY INSTEAD (Jolley ranchers or Stover's or Life Savers) or even ice chips will also do - the key is to swallow to prevent all throat clearing. NO OIL BASED VITAMINS - use powdered substitutes.  Best cough medication is delsym two tsp every 12 hours as needed   For itching sneezing runny nose take zyrtec 10 mg at bedtime   Please schedule a follow up office visit in 2  weeks, sooner if needed

## 2015-01-25 ENCOUNTER — Encounter: Payer: Self-pay | Admitting: Internal Medicine

## 2015-01-25 NOTE — Assessment & Plan Note (Addendum)
-   spirometry 06/27/2013   FEV1 1.92 (96%) ratio 72 and FEF 25-75 68% - Allergy eval by Harold Hedge 11/16/13  NO nl, neg allergy studies, rec topical nasal rx plus allegra - 01/24/2015 p extensive coaching HFA effectiveness =    75%    DDX of  difficult airways management all start with A and  include Adherence, Ace Inhibitors, Acid Reflux, Active Sinus Disease, Alpha 1 Antitripsin deficiency, Anxiety masquerading as Airways dz,  ABPA,  allergy(esp in young), Aspiration (esp in elderly), Adverse effects of DPI,  Active smokers, plus two Bs  = Bronchiectasis and Beta blocker use..and one C= CHF  Adherence is always the initial "prime suspect" and is a multilayered concern that requires a "trust but verify" approach in every patient - starting with knowing how to use medications, especially inhalers, correctly, keeping up with refills and understanding the fundamental difference between maintenance and prns vs those medications only taken for a very short course and then stopped and not refilled.  The proper method of use, as well as anticipated side effects, of a metered-dose inhaler are discussed and demonstrated to the patient. Improved effectiveness after extensive coaching during this visit to a level of approximately  75% so rec continue ICS lower dose= dulera 100 2bid   ? Acid (or non-acid) GERD > always difficult to exclude as up to 75% of pts in some series report no assoc GI/ Heartburn symptoms> rec max (24h)  acid suppression and diet restrictions/ reviewed and instructions given in writing.   ? Allergies > note w/u prev neg/ rec zyrtec instead of allegra, then 1st gen H1 if not better on zyrtec though risk more drowsiness

## 2015-02-11 ENCOUNTER — Ambulatory Visit: Payer: Medicare Other | Admitting: Internal Medicine

## 2015-02-18 ENCOUNTER — Ambulatory Visit (INDEPENDENT_AMBULATORY_CARE_PROVIDER_SITE_OTHER): Payer: Medicare Other | Admitting: Internal Medicine

## 2015-02-18 ENCOUNTER — Encounter: Payer: Self-pay | Admitting: Internal Medicine

## 2015-02-18 VITALS — BP 126/68 | HR 68 | Ht 62.0 in | Wt 150.8 lb

## 2015-02-18 DIAGNOSIS — J45991 Cough variant asthma: Secondary | ICD-10-CM | POA: Diagnosis not present

## 2015-02-18 MED ORDER — MOMETASONE FURO-FORMOTEROL FUM 100-5 MCG/ACT IN AERO: INHALATION_SPRAY | RESPIRATORY_TRACT | Status: DC

## 2015-02-18 NOTE — Progress Notes (Signed)
Subjective:    Patient ID: Suzanne Galloway, female    DOB: 1941/09/04   MRN: [id number]   Brief patient profile:  39 yowf never smoker with dx of recurrent bronchitis yearly starting around 2005 typically around November and better with zpak/predisone referred 06/16/2013 by Suzanne Suzanne Galloway for sob > cough onset March 2014   HPI 06/16/2013 1st [facility] office visit/ Suzanne Galloway cc sob > cough  X 5 months better with inhalers then flare again when they wear off, best one is albuterol and no better on symbicort so changed to dulera x sev days prior to OV ( But hfa nearly 0% at initial ov ) and not effective yet. Sob x > slow adls  rec Change Symbicort  To 80 strength and  Take 2 puffs first thing in am and then another 2 puffs about 12 hours later.  Hold the dulera for now Only use your albuterol (ventolin) as a rescue medication  Continue prilosec Take 30-60 min before first meal of the day and pepcid ac mg at bedtime  GERD  Diet  Work on inhaler technique    06/27/2013 f/u ov/Suzanne Galloway    Chief Complaint  Patient presents with  . Follow-up    Breathing and cough have improved some. She is having some cough this am- non prod and she relates to the damp weather. She has not had to use rescue inhaler since her last visit.   much better at hfa but still on pred ? 40  ? Daily.  rec Continue symbicort 80  Take 2 puffs first thing in am and then another 2 puffs about 12 hours later and keep working on perfecting your inhaler technique Only use your albuterol as a rescue medication to be used if you can't catch your breath by resting or doing a relaxed purse lip breathing pattern. The less you use it, the better it will work when you need it.  Goal is less than twice weekly Taper off the prednisone as per Suzanne Galloway and return if flare off it.   Admit date: 01/01/2015 Discharge date: 01/06/2015    Discharge Diagnoses:  Principal Problem:  Asthma exacerbation   Chest pressure  Cough  Low  grade fever  Thyroid disease  GERD (gastroesophageal reflux disease)  Depression  HLD (hyperlipidemia)  Benign essential HTN      Filed Weights   01/02/15 2030 01/03/15 2212 01/04/15 2123  Weight: 68.58 kg (151 lb 3.1 oz) 67.722 kg (149 lb 4.8 oz) 68.1 kg (150 lb 2.1 oz)    History of present illness:  74 year old lady admitted for asthma exacerbation.  Hospital Course:  1. Sob, chest pressure worse with couging and headache: Admitted for asthma exacerbation.  Started on IV steroids, later transitioned to po prednisone starting today. Her wheezing has improved. Discharge home on oral steroids to taper. Rapid strep antigen is negative.  Resume bronchodilators.    2. Chest pressure: She denies any chest pain today,.  Serial enzymes negative and echo is not significant. EKG reviewed , does not show any ischemic changes, EKG is normal sinus rhythm.  Recommended outpatient follow up with cardiology. SCHEDULED to 29 th of this month.  Resume aspirin and statin.   Hypertension: Well controlled.   Anemia: Appears to be at baseline.  Her repeat hemoglobin is better around 11.1. Anemia panel obtained showed low iron levels and low ferritin levels. Will start her on iron supplements.    Leukocytosis: Probably from IV steroids. She  does not have fever. Her leukocytosis is improving .Continue to monitor.     Hyperglycemia:  probably from steroids. hgba1c is 6.3. Started her on SSI while on steroids. Can d/c on ondischarge.   Persistent headache: CT head ordered and is negative for acute pathology. Headache improves with pain medications.         01/24/2015   ov/Suzanne Galloway re: post hosp f/u  Chief Complaint  Patient presents with  . HFU    Pt was hospitalized from 01/01/15-01/06/15 for asthma exacerbation. She feels that her breathing is better, but not back to normal baseline. She is coughing more, non prod and worse at night.  She states that she is  using proair 2 x per day on average.    off prednisone for months and just took singulair / symbicort and rarely ventolin then had R hand surgery in Jan 2016 then a week or two later started dry cough quit harsh and severe to point of incont but no vomiting, worse after supper and noct on prilosec before breakfastx 20 mg  rec Stop symbicort and try dulera 100 Take 2 puffs first thing in am and then another 2 puffs about 12 hours later.  Pantoprazole (protonix) 40 mg   Take 30-60 min before first meal of the day and Pepcid ac 20 mg one bedtime   GERD diet  Best cough medication is delsym two tsp every 12 hours as needed  For itching sneezing runny nose take zyrtec 10 mg at bedtime     02/18/2015 f/u ov/Suzanne Galloway re: much better on dulera 100 2bid no need for cough meds or albuterol  Chief Complaint  Patient presents with  . Follow-up    Pt states that her breathing is back at her normal baseline and her cough has improved some. She is using albuterol inhaler once per wk on average.    all smiles,  Not limited by breathing from desired activities  / no sign cough     No obvious day to day or  daytime variabilty or assoc   cp or chest tightness, subjective wheeze overt sinus or hb symptoms. No unusual exp hx or h/o childhood pna/ asthma or knowledge of premature birth.   Sleeping ok without nocturnal  or early am exacerbation  of respiratory  c/o's or need for noct saba. Also denies any obvious fluctuation of symptoms with weather or environmental changes or other aggravating or alleviating factors except as outlined above   Current Medications, Allergies, Complete Past Medical History, Past Surgical History, Family History, and Social History were reviewed in [employer] record.  ROS  The following are not active complaints unless bolded sore throat, dysphagia, dental problems, itching, sneezing,  nasal congestion or excess/ purulent secretions, ear ache,   fever, chills,  sweats, unintended wt loss, pleuritic or exertional cp, hemoptysis,  orthopnea pnd or leg swelling, presyncope, palpitations, heartburn, abdominal pain, anorexia, nausea, vomiting, diarrhea  or change in bowel or urinary habits, change in stools or urine, dysuria,hematuria,  rash, arthralgias, visual complaints, headache, numbness weakness or ataxia or problems with walking or coordination,  change in mood/affect or memory.           Objective:   Physical Exam    amb wf nad   01/24/2015  Wt Readings from Last 3 Encounters:  06/27/13 151 lb 12.8 oz (68.856 kg)  06/16/13 150 lb (68.04 kg)  01/05/13 150 lb (68.04 kg)     HEENT: Full upper denture , partial  lower  turbinates, and orophanx. Nl external ear canals without cough reflex   NECK :  without JVD/Nodes/TM/ nl carotid upstrokes bilaterally   LUNGS: no acc muscle use, clear to A and P bilaterally with no longer cough at end exp    CV:  RRR  no s3 or murmur or increase in P2, no edema   ABD:  soft and nontender with nl excursion in the supine position. No bruits or organomegaly, bowel sounds nl  MS:  warm without deformities, calf tenderness, cyanosis or clubbing  SKIN: warm and dry without lesions    NEURO:  alert, approp, no deficits     I personally reviewed images and agree with radiology impression as follows:  CXR:  01/01/15  No edema or consolidation.         Assessment & Plan:

## 2015-02-18 NOTE — Patient Instructions (Signed)
Continue dulera 100 Take 2 puffs first thing in am and then another 2 puffs about 12 hours later.   Only use your albuterol as a rescue medication to be used if you can't catch your breath by resting or doing a relaxed purse lip breathing pattern.  - The less you use it, the better it will work when you need it. - Ok to use up to 2 puffs  every 4 hours if you must but call for immediate appointment if use goes up over your usual need - Don't leave home without it !!  (think of it like the spare tire for your car)   Ok to cut your reflux medications as long as not coughing to what you were taking before as long as doing well.   If you are satisfied with your treatment plan,  let your doctor know and he/she can either refill your medications or you can return here when your prescription runs out.     If in any way you are not 100% satisfied,  please tell us.  If 100% better, tell your friends!  Pulmonary follow up is as needed

## 2015-02-18 NOTE — Assessment & Plan Note (Addendum)
-  spirometry 06/27/2013   FEV1 1.92 (96%) ratio 72 and FEF 25-75 68% - Allergy eval by Harold Hedge 11/16/13  NO nl, neg allergy studies, rec topical nasal rx plus allegra - 01/24/2015  Trial of dulera 100 2bid > improved 02/18/2015    The proper method of use, as well as anticipated side effects, of a metered-dose inhaler are discussed and demonstrated to the patient. Improved effectiveness after extensive coaching during this visit to a level of approximately      90%    I had an extended final summary discussion with the patient reviewing all relevant studies completed to date and  lasting 15 to 20 minutes of a 25 minute visit on the following issues:     1) All goals of chronic asthma control met(on dulera 100 2bid)  including optimal function and elimination of symptoms with minimal need for rescue therapy. Contingencies discussed in full including contacting this office immediately if not controlling the symptoms using the rule of two's.      2) At this point not clear whether the asthma was a primary (reflex to reflux)  or secondary (reflux to coughing) issue but should be able to tolerate "step down" to a lower strength ppi as she was previously.Discussed the recent press about ppi's in the context of a statistically significant (but questionably clinically relevant) increase in CRI in pts on ppi vs h2's > bottom line is the lowest dose of ppi that controls   gerd is the right dose and if that dose is zero that's fine esp since h2's are cheaper.   3)    Formulary restrictions will be an ongoing challenge for the forseable future and I would be happy to pick an alternative if the pt will first  provide me a list of them but pt  will need to return here for training for any new device that is required eg dpi vs hfa vs respimat.    In meantime we can always provide samples so the patient never runs out of any needed respiratory medications.   4) pulmonary f/u is prn - refills per primary as long as  doing great

## 2015-02-19 ENCOUNTER — Ambulatory Visit: Payer: Medicare Other | Admitting: Cardiovascular Disease

## 2015-02-19 ENCOUNTER — Other Ambulatory Visit: Payer: Self-pay | Admitting: Internal Medicine

## 2015-02-19 MED ORDER — LEVOTHYROXINE 112 MCG TABLET
112.0000 ug | ORAL_TABLET | Freq: Every day | ORAL | Status: DC
Start: 2015-02-19 — End: 2016-02-13

## 2015-04-03 ENCOUNTER — Other Ambulatory Visit: Payer: Self-pay

## 2015-04-03 DIAGNOSIS — Z1231 Encounter for screening mammogram for malignant neoplasm of breast: Secondary | ICD-10-CM

## 2015-04-15 ENCOUNTER — Other Ambulatory Visit: Payer: Self-pay

## 2015-05-14 ENCOUNTER — Telehealth: Payer: Self-pay | Admitting: Internal Medicine

## 2015-05-14 DIAGNOSIS — R42 Dizziness and giddiness: Principal | ICD-10-CM

## 2015-05-14 MED ORDER — MECLIZINE 25 MG TABLET
25.0000 mg | ORAL_TABLET | Freq: Three times a day (TID) | ORAL | Status: AC | PRN
Start: 2015-05-14 — End: 2016-05-13

## 2015-05-14 NOTE — Telephone Encounter (Signed)
Tammy [name] is not feeling well for the last 2 days  .  The patient's symptoms are vertigo & vomiting .  Fever no unknown .  The patient has tried Nothing .  The patient is requesting a prescription called in and advice

## 2015-05-14 NOTE — Telephone Encounter (Signed)
Patient [id number] was given message and verbalized understanding.      Tammy [name]  [facility] II   [place] PCN

## 2015-05-14 NOTE — Telephone Encounter (Signed)
Let her know prescription faxed.  Advise the patient that the medication can cause drowsiness.  Caution if driving or operating equipment.    If still having problems tomorrow, needs Office Visit.

## 2015-05-15 ENCOUNTER — Ambulatory Visit
Admission: RE | Admit: 2015-05-15 | Discharge: 2015-05-15 | Disposition: A | Discharge status: Home / Self Care | Payer: Medicare Other | Source: Ambulatory Visit

## 2015-05-15 ENCOUNTER — Ambulatory Visit: Payer: Medicare Other | Admitting: Internal Medicine

## 2015-05-15 DIAGNOSIS — Z1231 Encounter for screening mammogram for malignant neoplasm of breast: Secondary | ICD-10-CM

## 2015-05-17 ENCOUNTER — Encounter: Payer: Self-pay | Admitting: Internal Medicine

## 2015-05-17 ENCOUNTER — Ambulatory Visit: Payer: Medicare Other | Admitting: Internal Medicine

## 2015-05-17 VITALS — BP 120/72 | HR 72 | Temp 98.3°F | Ht 66.0 in | Wt 182.4 lb

## 2015-05-17 DIAGNOSIS — H8111 Benign paroxysmal vertigo, right ear: Principal | ICD-10-CM

## 2015-05-17 DIAGNOSIS — H6123 Impacted cerumen, bilateral: Secondary | ICD-10-CM

## 2015-05-17 NOTE — Nursing Note (Signed)
Patient identification verified by last name and date of birth.        Bilateral ear cerumen irrigated with warm water until TMs clearly visible.      Tammy [name] tolerated the procedure well.    D. Denym Rahimi, Sr. L.V.N.

## 2015-05-17 NOTE — Nursing Note (Signed)
Patient identified by last name and DOB.  Tammy [name] is here for vertigo.    Patient was asked and answered the following questions:  Since you last saw your [facility] provider, have you been seen by any other physicians or surgeons, gone to the emergency room/urgent care or been hospitalized?  No      Have you started any over-the-counter medications, herbal supplements, vitamins or home remedies?  No      [name], [place] I

## 2015-05-17 NOTE — Patient Instructions (Addendum)
For BPPV - avoid rapid position changes   - Continue Meclizine a few times per day   - physical therapy consult - call [facility] to do physical therapy early next week.

## 2015-05-17 NOTE — Progress Notes (Signed)
Chief Complaint   Patient presents with    Vertigo     Tammy [name] is a 74yr old female who presents for same day evaluation of vertigo.  Patient describes episodes of dizziness for about a month now but much worse in the past 4 days.  She is having episodes induced by certain maneuvers like laying down or turning her head quickly, has severe spinning associated with nausea and one episode of emesis 2 days ago.  She denies HA but has pressure in her neck and shoulders which she attributes to recent increased stress.  She notices tinnitus in both ears for about 3 years but this has not changed, no hearing loss.  In the past 2 days she has been taking Meclizine TID which seems to help a lot.   No lateralizing symptoms.  Has been using ear wax drops but not getting much out.      Other: remote hx PE and PAF in 2010 on [facility] until 2015 (cards notes reviewed).      ROS:  All systems negative other than that stated in the HPI.       Patient Active Problem List   Diagnosis    Hypothyroidism    ROUTINE MEDICAL EXAM    OTHER MALAISE AND FATIGUE    left supraclavicular fullness    LOSS OF WEIGHT    PURE HYPERCHOLESTEROLEMIA    PE (pulmonary embolism)    Atrial fibrillation    Long term current use of anticoagulant therapy    PE (physical exam)    BMI 28.0-28.9,adult    Vitamin D deficiency     Past Surgical History   Procedure Laterality Date    No surgical history      Colonoscopy       declined in past       Enoxaparin    Rash    Comment:When she had PE, diffuse rash, biopsied    ACTIVE MEDICATIONS (patient reports taking):  Outpatient Prescriptions Marked as Taking for the 05/17/15 encounter (Office Visit) with Dewayne Shorter, MD   Medication Sig Dispense Refill    Atorvastatin (LIPITOR) 40 mg tablet Take 1 tablet by mouth every day. (cholesterol medicine) 30 tablet 12    LevoTHYROxine (SYNTHROID) 112 mcg Tablet Take 1 tablet by mouth every morning before a meal. 90 tablet 3    Meclizine (ANTIVERT/25) 25 mg  Tablet Take 1 tablet by mouth three times daily if needed for Dizziness. 20 tablet 0     PHYSICAL EXAM:   BP 120/72 mmHg  Pulse 72  Temp(Src) 36.8 C (98.3 F) (Tympanic)  Ht 1.676 m ( )  Wt 82.736 kg (182 lb 6.4 oz)  BMI 29.45 kg/m2  SpO2 96%  GENERAL:  Alert, and in no distress.    HEENT:  EOMI, PERRLA, TMS obstructed by cerumin - copious cerumin lavaged and TMs clear after.    LUNGS:  Clear to auscultation bilaterally  COR:  Regular rate and rhythm, no murmurs rubs or gallops  EXTREMITIES:  No peripheral edema  Neurological exam is nonfocal.   Hallpike Dicks Maneuver positive on the right only with lateral and rotational nystagmus and vertigo elicited but fatigues within 15 seconds.      ASSESSMENT & PLAN:  (H81.11) BPPV (benign paroxysmal positional vertigo), right  (primary encounter diagnosis)  Comment: discussed diagnosis and treatment and she agrees.  No eo CVA.   Plan: PHYSICAL THERAPY REFERRAL        See instructions.     (  H61.23) Excess ear wax, bilateral  Comment: sp lavage, this didn't help the vertigo.   Plan: REMOVE IMPACTED EAR WAX-BACK OFFICE STAFF            I did review patient's past medical and family/social history, no changes noted.    Barriers to Learning assessed: none. Patient verbalizes understanding of teaching and instructions. An After Visit Summary is provided for written instructions.     Tammy [name], M.D.  Attending Physician,  Internal Medicine  [place] [facility], [facility] PCN   Phone [phone]

## 2015-05-20 ENCOUNTER — Telehealth: Payer: Self-pay | Admitting: Internal Medicine

## 2015-05-20 DIAGNOSIS — Z1231 Encounter for screening mammogram for malignant neoplasm of breast: Principal | ICD-10-CM

## 2015-05-20 NOTE — Telephone Encounter (Signed)
Done

## 2015-05-20 NOTE — Telephone Encounter (Signed)
Dr. Parks Neptune     I have placed and pended a mammogram order for Tammy [name].  Please sign the order and route this message back to me.  I will then contact the patient and attempt to schedule.  Please use ICD-10 code Z12.31.    Thank you,   Ermalene Postin LVN

## 2015-05-21 NOTE — Telephone Encounter (Signed)
Patient identification verified x 3.  Patient is sick and will call back to make appt.  Ima Hafner Drema Pry, MA

## 2015-05-21 NOTE — Telephone Encounter (Signed)
Please call pt and schedule for appointment for mammogram  [facility] LVN

## 2015-05-25 ENCOUNTER — Other Ambulatory Visit: Payer: Self-pay | Admitting: Internal Medicine

## 2015-06-23 ENCOUNTER — Other Ambulatory Visit: Payer: Self-pay | Admitting: Internal Medicine

## 2015-07-22 ENCOUNTER — Other Ambulatory Visit: Payer: Self-pay | Admitting: Internal Medicine

## 2015-07-22 DIAGNOSIS — R251 Tremor, unspecified: Secondary | ICD-10-CM

## 2015-07-22 DIAGNOSIS — R51 Headache: Secondary | ICD-10-CM

## 2015-07-22 DIAGNOSIS — G8929 Other chronic pain: Secondary | ICD-10-CM

## 2015-07-22 DIAGNOSIS — R2689 Other abnormalities of gait and mobility: Secondary | ICD-10-CM

## 2015-07-22 DIAGNOSIS — R519 Headache, unspecified: Secondary | ICD-10-CM

## 2015-07-23 ENCOUNTER — Ambulatory Visit: Payer: Medicare Other

## 2015-07-23 DIAGNOSIS — Z23 Encounter for immunization: Principal | ICD-10-CM

## 2015-07-23 NOTE — Nursing Note (Signed)
The Influenza Vaccine VIS document for the flu injection was given to patient to review. Patient or person named in permission has answered no to any history of egg allergy, previous serious reaction to a influenza vaccine or current illness which would preclude them receiving an immunization. Any questions were referred to the physician. The Influenza Vaccine was then administered per protocol. The patient was observed for immediate reactions to the vaccine per protocol. None were observed.   Tammy [name], LVN   Pt verified x2

## 2015-07-25 ENCOUNTER — Ambulatory Visit
Admission: RE | Admit: 2015-07-25 | Discharge: 2015-07-25 | Disposition: A | Discharge status: Home / Self Care | Payer: Medicare Other | Source: Ambulatory Visit | Attending: Internal Medicine | Admitting: Internal Medicine

## 2015-07-25 DIAGNOSIS — R2689 Other abnormalities of gait and mobility: Secondary | ICD-10-CM

## 2015-07-25 DIAGNOSIS — R519 Headache, unspecified: Secondary | ICD-10-CM

## 2015-07-25 DIAGNOSIS — R251 Tremor, unspecified: Secondary | ICD-10-CM

## 2015-07-25 DIAGNOSIS — R51 Headache: Secondary | ICD-10-CM

## 2015-07-25 MED ORDER — GADOBENATE DIMEGLUMINE 529 MG/ML IV SOLN
13.0000 mL | Freq: Once | INTRAVENOUS | Status: AC | PRN
  Administered 2015-07-25: 13 mL via INTRAVENOUS

## 2015-08-05 ENCOUNTER — Ambulatory Visit (INDEPENDENT_AMBULATORY_CARE_PROVIDER_SITE_OTHER): Payer: Medicare Other | Admitting: Neurology

## 2015-08-05 ENCOUNTER — Encounter: Payer: Self-pay | Admitting: Neurology

## 2015-08-05 VITALS — BP 120/62 | HR 60 | Ht 62.5 in | Wt 147.0 lb

## 2015-08-05 DIAGNOSIS — R2681 Unsteadiness on feet: Secondary | ICD-10-CM | POA: Diagnosis not present

## 2015-08-05 DIAGNOSIS — R55 Syncope and collapse: Secondary | ICD-10-CM | POA: Diagnosis not present

## 2015-08-05 DIAGNOSIS — R202 Paresthesia of skin: Secondary | ICD-10-CM | POA: Diagnosis not present

## 2015-08-05 DIAGNOSIS — G40209 Localization-related (focal) (partial) symptomatic epilepsy and epileptic syndromes with complex partial seizures, not intractable, without status epilepticus: Secondary | ICD-10-CM | POA: Diagnosis not present

## 2015-08-05 LAB — VITAMIN B12: Vitamin B-12: 930 pg/mL — ABNORMAL HIGH (ref 211–911)

## 2015-08-05 LAB — FOLATE: Folate: 15.8 ng/mL

## 2015-08-05 NOTE — Progress Notes (Signed)
Subjective:   Suzanne Galloway was seen in consultation in the movement disorder clinic at the request of Irven Shelling, MD.  The evaluation is for tremor.  The records that were made available to me were reviewed.  Pt presents for evaluation of tremor, mild headache and balance change.  Reports that sx's started earlier in 2016, after a hospitalization in 12/2014.  After the hospitalization, she states that she felt a little uncoordinated, "like my legs don't want to go where they are told."  Tremor is intermittent.  "I am solid today."  It is in the right hand.  It is with use of the hand.  No trouble with ADL's, or eating soup.  States that she also has a feeling of "deja vu" and when she has that she will feel near syncopal.  She will feel like "ice water is running through her veins."  She feels like "in the old days when you would be given ether, and right before you would pass out."  That may happen one time a week or it may be several weeks before she has them.  She started dulera in 01/2015.     Tremor characteristics: Affected by caffeine:  No. (2 cups coffee, 2 coke per day) Affected by alcohol:  Doesn't drink enough to know Affected by stress:  Unsure but under lots of stress Affected by fatigue:  Yes.   Spills soup if on spoon:  No. Spills glass of liquid if full:  No. Affects ADL's (tying shoes, brushing teeth, etc):  No.  I reviewed her MRI of the brain from 07/26/15 and there was a moderate amount of small vessel disease.  She has a hx of hyperlipidemia.    Outside reports reviewed: historical medical records and radiology reports.  Allergies  Allergen Reactions  . Dilaudid  [Hydromorphone] Other (See Comments)  . Lactose Intolerance (Gi) Diarrhea  . Morphine And Related Nausea And Vomiting  . Percocet [Oxycodone-Acetaminophen] Other (See Comments)    hallucinations  . Pravastatin Other (See Comments)  . Vesicare [Solifenacin]     constipation    Outpatient Encounter  Prescriptions as of 08/05/2015  Medication Sig  . albuterol (PROVENTIL HFA;VENTOLIN HFA) 108 (90 BASE) MCG/ACT inhaler Inhale 2 puffs into the lungs every 4 (four) hours as needed for wheezing.  Marland Kitchen atorvastatin (LIPITOR) 20 MG tablet Take 1 tablet (20 mg total) by mouth daily. (Patient taking differently: Take 10 mg by mouth daily. )  . benzonatate (TESSALON) 200 MG capsule Take 200 mg by mouth 3 (three) times daily as needed for cough.  Marland Kitchen buPROPion (WELLBUTRIN XL) 150 MG 24 hr tablet Take 150 mg by mouth daily.  . Calcium-Vitamin D 500-125 MG-UNIT TABS Take 1 tablet by mouth daily.  . cholecalciferol (VITAMIN D) 1000 UNITS tablet Take 1,000 Units by mouth daily.  . cyanocobalamin 1000 MCG tablet Take 1,000 mcg by mouth daily.  Marland Kitchen escitalopram (LEXAPRO) 20 MG tablet Take 20 mg by mouth daily.  . famotidine (PEPCID) 20 MG tablet One at bedtime  . levothyroxine (SYNTHROID, LEVOTHROID) 75 MCG tablet 1 tablet every mon, wed, fri  . levothyroxine (SYNTHROID, LEVOTHROID) 88 MCG tablet 1 tablet every tues, thurs, sat, sun  . metoprolol succinate (TOPROL-XL) 50 MG 24 hr tablet Take 50 mg by mouth daily. Take with or immediately following a meal.  . mometasone-formoterol (DULERA) 100-5 MCG/ACT AERO Take 2 puffs first thing in am and then another 2 puffs about 12 hours later.  . Pyridoxine HCl (VITAMIN  B-6 PO) Take by mouth daily.  . [DISCONTINUED] ferrous sulfate 300 (60 FE) MG/5ML syrup Take 5 mLs (300 mg total) by mouth daily with breakfast. (Patient taking differently: Take 300 mg by mouth 3 (three) times a week. )  . [DISCONTINUED] pantoprazole (PROTONIX) 40 MG tablet TAKE 1 TABLET (40 MG TOTAL) BY MOUTH DAILY. TAKE 30-60 MIN BEFORE FIRST MEAL OF THE DAY  . [DISCONTINUED] Polyvinyl Alcohol-Povidone (REFRESH OP) Place 1 drop into both eyes daily.   No facility-administered encounter medications on file as of 08/05/2015.    Past Medical History  Diagnosis Date  . Mitral valve prolapse   . GERD  (gastroesophageal reflux disease)   . Depression   . Asthma   . Hyperlipemia   . Hypothyroidism   . High cholesterol   . Chronic bronchitis ([place])     "get it just about q yr" (01/01/2015)  . Arthritis     "hands, back, toes" (01/01/2015)  . Urinary incontinence     Past Surgical History  Procedure Laterality Date  . Toe surgery Left     "took bone out of 2nd toe; took cyst out"  . Back surgery    . Incontinence surgery    . Carpal tunnel release Bilateral   . Laparoscopic cholecystectomy    . Lumbar laminectomy  1987; 12/2009    ; Archie Endo 01/16/2010  . Cystectomy Right 09/2014    "hand"   . Finger surgery Bilateral     "scraped arthritis out of thumbs"  . Vaginal hysterectomy  1970  . Dilation and curettage of uterus    . Tubal ligation      Social History   Social History  . Marital Status: Widowed    Spouse Name: N/A  . Number of Children: 3  . Years of Education: N/A   Occupational History  . Retired     [place]  . Smoking status: Never Smoker   . Smokeless tobacco: Never Used  . Alcohol Use: Yes     Comment: 01/01/2015 "mixed drink q 2 months or so"  . Drug Use: No  . Sexual Activity: Not Currently   Other Topics Concern  . Not on file   Social History Narrative    Family Status  Relation Status Death Age  . Father Deceased     COPD, esophageal cancer, heart disease  . Brother Alive     aneurysm  . Mother Deceased     PD    Review of Systems Admits to cramping in the toes and feet.  Admits to burning pain in the feet.  Admits to trouble with grasping with hands.  A complete 10 system ROS was obtained and was negative apart from what is mentioned.   Objective:   VITALS:   Filed Vitals:   08/05/15 1354  BP: 120/62  Pulse: 60  Height: 5' 2.5" (1.588 m)  Weight: 147 lb (66.679 kg)   Gen:  Appears stated age and in NAD. HEENT:  Normocephalic, atraumatic. The mucous membranes are moist. The superficial  temporal arteries are without ropiness or tenderness. Cardiovascular: Regular rate and rhythm. Lungs: Clear to auscultation bilaterally. Neck: There are no carotid bruits noted bilaterally.  NEUROLOGICAL:  Orientation:  The patient is alert and oriented x 3.  Recent and remote memory are intact.  Attention span and concentration are normal.  Able to name objects and repeat without trouble.  Fund of knowledge is appropriate Cranial nerves: There is  good facial symmetry. The pupils are equal round and reactive to light bilaterally. Fundoscopic exam reveals clear disc margins bilaterally. Extraocular muscles are intact and visual fields are full to confrontational testing. Speech is fluent and clear. Soft palate rises symmetrically and there is no tongue deviation. Hearing is intact to conversational tone. Tone: Tone is good throughout. Sensation: Sensation is intact to light touch and pinprick throughout (facial, trunk, extremities). Vibration is intact at the bilateral big toe but slightly decreased. There is no extinction with double simultaneous stimulation. There is no sensory dermatomal level identified. Coordination:  The patient has no dysdiadichokinesia or dysmetria. Motor: Strength is 5/5 in the bilateral upper and lower extremities.  Shoulder shrug is equal bilaterally.  There is no pronator drift.  There are no fasciculations noted. DTR's: Deep tendon reflexes are 2/4 at the bilateral biceps, triceps, brachioradialis, 2+ at the bilateral patella and 1/4 at the bilateral achilles.  Plantar responses are downgoing bilaterally. Gait and Station: The patient is able to ambulate without difficulty. The patient is able to heel toe walk without any difficulty. The patient has mild difficulty in relating a tandem fashion.  The patient is able to stand in the Romberg position.   MOVEMENT EXAM: Tremor:  There is no tremor of the outstretched hands.  There is no tremor with intention.  There is no  winging beating tremor.  She has minimal difficulty with Archimedes spirals.  She does still water when asked to pour it from one glass to another, but that is only because she is careless and initially pours it very quickly.  After that first trial, she reports it well.   Lab Results  Component Value Date   [id number] 010 01/03/2015   Lab Results  Component Value Date   HGBA1C 6.3* 01/03/2015    I reviewed his labs from her primary care physician from the end of September, 2016.  Her hemoglobin A1c was rechecked and it was 6.0.  Her TSH was normal at 1.60.  Assessment/Plan:   1.  Tremor, by history.  -I saw no tremor today and the patient reported that she was doing well today.  I suspect that intermittent tremor may be from her asthma medications.  She reports that this really started after her hospitalization in March.  She was started on [facility] right after that hospitalization and perhaps that could contribute.  She is also on Proventil.  Regardless, I saw nothing suspicious for any type of neurodegenerative disease.  I also saw nothing that would make me want to start her on any medication for tremor. 2.  Gait instability  -She may have some mild peripheral neuropathy, although her examination looks quite good, as above.  We will do an EMG and some lab work, including a repeat B12, folate, RPR, SPEP/UPEP with immunofixation. 3.  Episodes of near-syncope with dj vu  -I do think that seizure needs to be ruled out.  She will have an EEG and if that is negative she will have an ambulatory EEG. 4.  We will call her with the results of the above testing.  We will determine follow-up based on the above as well.  Much greater than 50% of this visit was spent in counseling with the patient and the family.  Total face to face time:  60 min

## 2015-08-05 NOTE — Patient Instructions (Signed)
1. Your provider has requested that you have labwork completed today. Please go to [facility] on the second floor of [address]. 2. We will call you to schedule your EMG.

## 2015-08-06 LAB — RPR

## 2015-08-07 LAB — SPEP & IFE WITH QIG
ALBUMIN ELP: 4.7 g/dL (ref 3.8–4.8)
Alpha-1-Globulin: 0.3 g/dL (ref 0.2–0.3)
Alpha-2-Globulin: 0.7 g/dL (ref 0.5–0.9)
BETA 2: 0.3 g/dL (ref 0.2–0.5)
Beta Globulin: 0.4 g/dL (ref 0.4–0.6)
Gamma Globulin: 1.1 g/dL (ref 0.8–1.7)
IGG (IMMUNOGLOBIN G), SERUM: 1160 mg/dL (ref 690–1700)
IgA: 139 mg/dL (ref 69–380)
IgM, Serum: 126 mg/dL (ref 52–322)
TOTAL PROTEIN, SERUM ELECTROPHOR: 7.4 g/dL (ref 6.1–8.1)

## 2015-08-08 ENCOUNTER — Ambulatory Visit (INDEPENDENT_AMBULATORY_CARE_PROVIDER_SITE_OTHER): Payer: Medicare Other | Admitting: Neurology

## 2015-08-08 ENCOUNTER — Telehealth: Payer: Self-pay | Admitting: Neurology

## 2015-08-08 DIAGNOSIS — G40209 Localization-related (focal) (partial) symptomatic epilepsy and epileptic syndromes with complex partial seizures, not intractable, without status epilepticus: Secondary | ICD-10-CM

## 2015-08-08 DIAGNOSIS — R55 Syncope and collapse: Secondary | ICD-10-CM

## 2015-08-08 LAB — UIFE/LIGHT CHAINS/TP QN, 24-HR UR
Albumin, U: DETECTED
Total Protein, Urine: <4 mg/dL — ABNORMAL LOW (ref 5–24)

## 2015-08-08 NOTE — Telephone Encounter (Signed)
If she calls you back, may let her know that routine EEG was negative and make sure that she understands that while I am looking for seizure, I don't know if these are seizures.  Agree with holding on driving.

## 2015-08-08 NOTE — Telephone Encounter (Signed)
Documented in previous encounter

## 2015-08-08 NOTE — Telephone Encounter (Signed)
Please call patient at [phone]

## 2015-08-08 NOTE — Telephone Encounter (Signed)
What does that mean in terms of symptoms?  As far as I know she has never had seizures?  I did EEG to look for them and since it has only been a few hours since she has had it done (and its not even in my box to read), it hasn't been read.

## 2015-08-08 NOTE — Telephone Encounter (Signed)
Pt states that she had 3 seizures this morning before she came in

## 2015-08-08 NOTE — Telephone Encounter (Signed)
She had EEG at 9:00 am this morning. Please advise.

## 2015-08-08 NOTE — Procedures (Signed)
TECHNICAL SUMMARY:  A multi-channel referential and bipolar montage EEG using the standard international 10-20 system was performed on the patient described as awake.  The dominant background activity consists of 10-11 hertz activity seen most prominantly over the anterior head region.  The backgound activity is reactive to eye opening and closing procedures.  Low voltage fast (beta) activity is distributed symmetrically and maximally over the anterior head regions.  ACTIVATION:  Stepwise photic stimulation at 4-20 flashes per second was performed and did not elicit any abnormal waveforms but did produce a symmetric driving response.  Hyperventilation was performed for 3 minutes with good patient effort and produced no changes in the background activity.  EPILEPTIFORM ACTIVITY:  There were no spikes, sharp waves or paroxysmal activity.  SLEEP:  none  CARDIAC:  The EKG lead revealed a regular sinus rhythm.  IMPRESSION:  This is a normal EEG for the patients stated age.  There were no focal, hemispheric or lateralizing features.  No epileptiform activity was recorded.

## 2015-08-08 NOTE — Telephone Encounter (Signed)
Will let patient know that labs look fine when she calls back.

## 2015-08-08 NOTE — Telephone Encounter (Signed)
Tried to call patient with no answer and no way to leave message. She was seen this morning for EEG and informed Manuela Schwartz she had 3 episodes this morning of ice water in the veins, hyper deja vu but no loss of consciousness. She stated one episode happened while she was driving here and Manuela Schwartz informed her she should not be driving. She is scheduled for 48 hour EEG next Tuesday.

## 2015-08-08 NOTE — Telephone Encounter (Signed)
VM-PT called and stated she had 5 seizures today and wanted to know if there is something she can do/Dawn CB# [phone]

## 2015-08-08 NOTE — Telephone Encounter (Signed)
Patient made aware of Dr Doristine Devoid message and that labs were normal. She will keep appt for 48 hour EEG.

## 2015-08-13 ENCOUNTER — Ambulatory Visit (INDEPENDENT_AMBULATORY_CARE_PROVIDER_SITE_OTHER): Payer: Medicare Other | Admitting: Neurology

## 2015-08-13 DIAGNOSIS — M5417 Radiculopathy, lumbosacral region: Secondary | ICD-10-CM

## 2015-08-13 DIAGNOSIS — G40209 Localization-related (focal) (partial) symptomatic epilepsy and epileptic syndromes with complex partial seizures, not intractable, without status epilepticus: Secondary | ICD-10-CM | POA: Diagnosis not present

## 2015-08-13 DIAGNOSIS — R202 Paresthesia of skin: Secondary | ICD-10-CM | POA: Diagnosis not present

## 2015-08-13 DIAGNOSIS — R2681 Unsteadiness on feet: Secondary | ICD-10-CM

## 2015-08-13 DIAGNOSIS — R55 Syncope and collapse: Secondary | ICD-10-CM

## 2015-08-13 NOTE — Procedures (Signed)
[facility] Neu[facility]  [address], [address]  [place], [place] [address] Tel: [phone] Fax:  [phone] Test Date:  08/13/2015  Patient: Suzanne Galloway DOB: Feb 15, 1941 Physician: Narda Amber, DO  Sex: Female Height: 5\' 2"  Ref Phys: Alonza Bogus, DO  ID#: [id number] Temp: 33.0C Technician: Jerilynn Mages. Dean   Patient Complaints: This is a 74 year old female with history of lumbar decompression referred for evaluation of bilateral feet paresthesias and gait instability.  NCV & EMG Findings: Extensive electrodiagnostic testing of the left lower extremity and additional studies of the right shows: 1. Bilateral sural and superficial peroneal sensory responses are within normal limits. 2. Bilateral tibial and peroneal motor responses are within normal limits. 3. Tibial H reflex studies are within normal limits. 4. Chronic motor axon loss changes are seen affecting the L2-L5 myotomes bilaterally, without accompanied active denervation.  Impression: 1. Chronic multilevel radiculopathy affecting L2-L5 levels bilaterally; moderate in degree electrically. 2. There is no evidence of a large fiber generalized sensorimotor polyneuropathy affecting the lower extremities.  ___________________________ Narda Amber, DO    Nerve Conduction Studies Anti Sensory Summary Table   Site NR Peak (ms) Norm Peak (ms) P-T Amp (V) Norm P-T Amp  Left Sup Peroneal Anti Sensory (Ant Lat Mall)  33.0C  12 cm    3.2 <4.6 4.8 >3  Right Sup Peroneal Anti Sensory (Ant Lat Mall)  33.0C  12 cm    3.0 <4.6 7.5 >3  Left Sural Anti Sensory (Lat Mall)  33.0C  Calf    3.4 <4.6 9.9 >3  Right Sural Anti Sensory (Lat Mall)  33.0C  Calf    3.6 <4.6 6.0 >3   Motor Summary Table   Site NR Onset (ms) Norm Onset (ms) O-P Amp (mV) Norm O-P Amp Site1 Site2 Delta-0 (ms) Dist (cm) Vel (m/s) Norm Vel (m/s)  Left Peroneal Motor (Ext Dig Brev)  33.0C  Ankle    3.9 <6.0 5.4 >2.5 B Fib Ankle 5.9 28.0 47 >40  B Fib    9.8  4.4   Poplt B Fib 2.2 10.0 45 >40  Poplt    12.0  4.0         Right Peroneal Motor (Ext Dig Brev)  33.0C  Ankle    3.3 <6.0 6.6 >2.5 B Fib Ankle 5.8 27.0 47 >40  B Fib    9.1  6.1  Poplt B Fib 1.7 10.0 59 >40  Poplt    10.8  5.9         Left Tibial Motor (Abd Hall Brev)  33.0C  Ankle    3.6 <6.0 7.8 >4 Knee Ankle 8.6 38.0 44 >40  Knee    12.2  5.5         Right Tibial Motor (Abd Hall Brev)  33.0C  Ankle    3.1 <6.0 8.2 >4 Knee Ankle 8.5 38.0 45 >40  Knee    11.6  5.6          H Reflex Studies   NR H-Lat (ms) Lat Norm (ms) L-R H-Lat (ms)  Left Tibial (Gastroc)  33.0C     30.07 <35 2.04  Right Tibial (Gastroc)  33.0C     32.11 <35 2.04   EMG   Side Muscle Ins Act Fibs Psw Fasc Number Recrt Dur Dur. Amp Amp. Poly Poly. Comment  Left AntTibialis Nml Nml Nml Nml 1- Rapid Some 1+ Some 1+ Nml Nml N/A  Left RectFemoris Nml Nml Nml Nml 1- Rapid Some 1+ Some 1+  Nml Nml N/A  Left Flex Dig Long Nml Nml Nml Nml 1- Mod-R Some 1+ Few 1+ Nml Nml N/A  Left AdductorLong Nml Nml Nml Nml 1- Rapid Some 1+ Some 1+ Nml Nml N/A  Left GluteusMed Nml Nml Nml Nml 1- Rapid Some 1+ Nml Nml Nml Nml N/A  Left Gastroc Nml Nml Nml Nml Nml Nml Nml Nml Nml Nml Nml Nml N/A  Left BicepsFemS Nml Nml Nml Nml Nml Nml Nml Nml Nml Nml Nml Nml N/A  Right AntTibialis Nml Nml Nml Nml 1- Rapid Some 1+ Some 1+ Nml Nml N/A  Right RectFemoris Nml Nml Nml Nml 1- Rapid Some 1+ Some 1+ Nml Nml N/A  Right GluteusMed Nml Nml Nml Nml 1- Rapid Some 1+ Nml Nml Nml Nml N/A  Right Gastroc Nml Nml Nml Nml Nml Nml Nml Nml Nml Nml Nml Nml N/A      Waveforms:

## 2015-08-14 ENCOUNTER — Telehealth: Payer: Self-pay | Admitting: Neurology

## 2015-08-14 NOTE — Telephone Encounter (Signed)
-----   Message from [place], DO sent at 08/13/2015  5:45 PM EDT ----- Let pt know EMG results please

## 2015-08-14 NOTE — Telephone Encounter (Signed)
Pt is returning your call

## 2015-08-14 NOTE — Telephone Encounter (Signed)
Left message on machine for patient to call back. To make patient aware no PN found on EMG, some old pinched nerves in back. Awaiting call back.

## 2015-08-15 NOTE — Telephone Encounter (Signed)
Patient made aware of results.  

## 2015-08-15 NOTE — Telephone Encounter (Signed)
PT returned your call/Dawn CB# [phone]

## 2015-08-20 NOTE — Procedures (Signed)
ELECTROENCEPHALOGRAM REPORT  Dates of Recording: 08/13/2015 to 20/26/2016  Patient's Name: Suzanne Galloway MRN: [id number] Date of Birth: May 02, 1941  Referring Provider: Dr. Wells Guiles Tat  Procedure: 24-hour ambulatory EEG  History: This is a 74 year old woman with episodes of deja vu and near syncope. EEG for classification.  Medications: Symbicort, Lipitor, Tessalon, Wellbutrin, Vitamin D, B12, Lexapro, Pepcid, Dulera, Levothyroxine, Toprol XL,   Technical Summary: This is a 24-hour multichannel digital EEG recording measured by the international 10-20 system with electrodes applied with paste and impedances below 5000 ohms performed as portable with EKG monitoring.  The digital EEG was referentially recorded, reformatted, and digitally filtered in a variety of bipolar and referential montages for optimal display.    DESCRIPTION OF RECORDING: During maximal wakefulness, the background activity consisted of a symmetric 10 Hz posterior dominant rhythm which was reactive to eye opening.  There were no epileptiform discharges or focal slowing seen in wakefulness.  During the recording, the patient progresses through wakefulness, drowsiness, and Stage 2 sleep.  Again, there were no epileptiform discharges seen.  Events: There were no push button events. Typical events were not reported.   There were no electrographic seizures seen.  EKG lead was unremarkable.  IMPRESSION: This 24-hour ambulatory EEG study is normal.    CLINICAL CORRELATION: A normal EEG does not exclude a clinical diagnosis of epilepsy.  Typical events were not captured. If further clinical questions remain, inpatient video EEG monitoring may be helpful.   Ellouise Newer, M.D.

## 2015-08-21 ENCOUNTER — Telehealth: Payer: Self-pay | Admitting: Neurology

## 2015-08-21 NOTE — Telephone Encounter (Signed)
-----   Message from [place], DO sent at 08/21/2015  7:28 AM EDT ----- Please let pt know that her prolonged EEG did not show any evidence of seizures/abnormal brain waves

## 2015-08-21 NOTE — Telephone Encounter (Signed)
Pt returned your call.  

## 2015-08-21 NOTE — Telephone Encounter (Signed)
Left message on machine for patient to call back.

## 2015-08-21 NOTE — Telephone Encounter (Signed)
Spoke with patient and given results. She states she stopped Dulera and went back to Symbicort. She has not had any deja vu episodes since. This was listed as a possible side effect of Dulera. She will follow up with pulmonary re: these medications and call if needed.

## 2015-08-22 ENCOUNTER — Other Ambulatory Visit: Payer: Self-pay | Admitting: Internal Medicine

## 2015-10-01 ENCOUNTER — Other Ambulatory Visit: Payer: Self-pay | Admitting: Cardiovascular Disease

## 2015-10-01 ENCOUNTER — Other Ambulatory Visit: Payer: Self-pay

## 2015-10-01 DIAGNOSIS — E785 Hyperlipidemia, unspecified: Principal | ICD-10-CM

## 2015-10-02 MED ORDER — ATORVASTATIN 40 MG TABLET
40.0000 mg | ORAL_TABLET | Freq: Every day | ORAL | 5 refills | Status: DC
Start: 2015-10-02 — End: 2016-03-11

## 2015-11-21 DIAGNOSIS — K219 Gastro-esophageal reflux disease without esophagitis: Secondary | ICD-10-CM | POA: Diagnosis not present

## 2015-11-21 DIAGNOSIS — J310 Chronic rhinitis: Secondary | ICD-10-CM | POA: Diagnosis not present

## 2015-11-21 DIAGNOSIS — H1045 Other chronic allergic conjunctivitis: Secondary | ICD-10-CM | POA: Diagnosis not present

## 2015-11-21 DIAGNOSIS — R05 Cough: Secondary | ICD-10-CM | POA: Diagnosis not present

## 2015-11-23 DIAGNOSIS — M25551 Pain in right hip: Secondary | ICD-10-CM | POA: Diagnosis not present

## 2015-11-23 DIAGNOSIS — M7071 Other bursitis of hip, right hip: Secondary | ICD-10-CM | POA: Diagnosis not present

## 2015-12-05 ENCOUNTER — Ambulatory Visit: Payer: Medicare Other | Admitting: Internal Medicine

## 2015-12-06 ENCOUNTER — Encounter: Payer: Self-pay | Admitting: Internal Medicine

## 2015-12-06 ENCOUNTER — Ambulatory Visit (INDEPENDENT_AMBULATORY_CARE_PROVIDER_SITE_OTHER): Payer: Medicare Other | Admitting: Internal Medicine

## 2015-12-06 VITALS — BP 128/76 | HR 73 | Ht 62.0 in | Wt 147.4 lb

## 2015-12-06 DIAGNOSIS — J45991 Cough variant asthma: Secondary | ICD-10-CM

## 2015-12-06 MED ORDER — BUDESONIDE-FORMOTEROL FUMARATE 80-4.5 MCG/ACT IN AERO: INHALATION_SPRAY | RESPIRATORY_TRACT | Status: DC

## 2015-12-06 NOTE — Patient Instructions (Addendum)
symbicort 80 Take 2 puffs first thing in am and then another 2 puffs about 12 hours later.   Only use your albuterol as a rescue medication to be used if you can't catch your breath by resting or doing a relaxed purse lip breathing pattern.  - The less you use it, the better it will work when you need it. - Ok to use up to 2 puffs  every 4 hours if you must but call for immediate appointment if use goes up over your usual need - Don't leave home without it !!  (think of it like the spare tire for your car)   omepraprazole 40 mg Take 30-60 min before first meal of the day and Pepcid 20 mg at bedtime  Gaviscon liquid as needed or Maalox plus   GERD (REFLUX)  is an extremely common cause of respiratory symptoms just like yours , many times with no obvious heartburn at all.    It can be treated with medication, but also with lifestyle changes including elevation of the head of your bed (ideally with 6 inch  bed blocks),  Smoking cessation, avoidance of late meals, excessive alcohol, and avoid fatty foods, chocolate, peppermint, colas, red wine, and acidic juices such as orange juice.  NO MINT OR MENTHOL PRODUCTS SO NO COUGH DROPS  USE SUGARLESS CANDY INSTEAD (Jolley ranchers or Stover's or Life Savers) or even ice chips will also do - the key is to swallow to prevent all throat clearing. NO OIL BASED VITAMINS - use powdered substitutes.   If you are satisfied with your treatment plan,  let your doctor know and he/she can either refill your medications or you can return here when your prescription runs out.     If in any way you are not 100% satisfied,  please tell us.  If 100% better, tell your friends!  Pulmonary follow up is as needed

## 2015-12-06 NOTE — Progress Notes (Signed)
Subjective:    Patient ID: Suzanne Galloway, female    DOB: [birth year]   MRN: [id number]   Brief patient profile:  86 yowf never smoker with dx of recurrent bronchitis yearly starting around 2005 typically around November and better with zpak/predisone referred 06/16/2013 by Dr Lavone Orn for sob > cough onset March 2014   HPI 06/16/2013 1st [facility] office visit/ Suzanne Galloway cc sob > cough  X 5 months better with inhalers then flare again when they wear off, best one is albuterol and no better on symbicort so changed to dulera x sev days prior to OV ( But hfa nearly 0% at initial ov ) and not effective yet. Sob x > slow adls  rec Change Symbicort  To 80 strength and  Take 2 puffs first thing in am and then another 2 puffs about 12 hours later.  Hold the dulera for now Only use your albuterol (ventolin) as a rescue medication  Continue prilosec Take 30-60 min before first meal of the day and pepcid ac mg at bedtime  GERD  Diet  Work on inhaler technique    06/27/2013 f/u ov/Suzanne Galloway    Chief Complaint  Patient presents with  . Follow-up    Breathing and cough have improved some. She is having some cough this am- non prod and she relates to the damp weather. She has not had to use rescue inhaler since her last visit.   much better at hfa but still on pred ? 40  ? Daily.  rec Continue symbicort 80  Take 2 puffs first thing in am and then another 2 puffs about 12 hours later and keep working on perfecting your inhaler technique Only use your albuterol as a rescue medication to be used if you can't catch your breath by resting or doing a relaxed purse lip breathing pattern. The less you use it, the better it will work when you need it.  Goal is less than twice weekly Taper off the prednisone as per Dr Laurann Montana and return if flare off it.     02/18/2015 f/u ov/Suzanne Galloway re: much better on dulera 100 2bid no need for cough meds or albuterol  Chief Complaint  Patient presents with  . Follow-up    Pt  states that her breathing is back at her normal baseline and her cough has improved some. She is using albuterol inhaler once per wk on average.    all smiles,  Not limited by breathing from desired activities  / no sign cough   rec Continue dulera 100 Take 2 puffs first thing in am and then another 2 puffs about 12 hours later.  Only use your albuterol as a rescue medication   Ok to cut your reflux medications as long as not coughing to what you were taking before as long as doing well. F/u as needed    12/06/2015  f/u ov/Suzanne Galloway re: asthma symbicort 160 maint p "sz" on dulera/ did not follow contingency recs as above  Chief Complaint  Patient presents with  . Follow-up    hacky cough and raspy voice.     onset x sev weeks prior to OV  Indolent/ gradually worse cough assoc with overt HB symptoms 24/7   No obvious day to day or  daytime variabilty or assoc excess/ purulent sputum or mucus plugs    cp or chest tightness, subjective wheeze overt sinus  symptoms. No unusual exp hx or h/o childhood pna/ asthma or knowledge of premature birth.  Also denies any obvious fluctuation of symptoms with weather or environmental changes or other aggravating or alleviating factors except as outlined above   Current Medications, Allergies, Complete Past Medical History, Past Surgical History, Family History, and Social History were reviewed in [employer] record.  ROS  The following are not active complaints unless bolded sore throat, dysphagia, dental problems, itching, sneezing,  nasal congestion or excess/ purulent secretions, ear ache,   fever, chills, sweats, unintended wt loss, pleuritic or exertional cp, hemoptysis,  orthopnea pnd or leg swelling, presyncope, palpitations, heartburn, abdominal pain, anorexia, nausea, vomiting, diarrhea  or change in bowel or urinary habits, change in stools or urine, dysuria,hematuria,  rash, arthralgias, visual complaints, headache, numbness  weakness or ataxia or problems with walking or coordination,  change in mood/affect or memory.           Objective:   Physical Exam    amb wf nad    Wt Readings from Last 3 Encounters:  12/06/15 147 lb 6.4 oz (66.86 kg)  08/05/15 147 lb (66.679 kg)  02/18/15 150 lb 12.8 oz (68.402 kg)    Vital signs reviewed    HEENT: Full upper denture , partial lower  turbinates, and orophanx. Nl external ear canals without cough reflex   NECK :  without JVD/Nodes/TM/ nl carotid upstrokes bilaterally   LUNGS: no acc muscle use, clear to A and P bilaterally    CV:  RRR  no s3 or murmur or increase in P2, no edema   ABD:  soft and nontender with nl excursion in the supine position. No bruits or organomegaly, bowel sounds nl  MS:  warm without deformities, calf tenderness, cyanosis or clubbing  SKIN: warm and dry without lesions    NEURO:  alert, approp, no deficits     I personally reviewed images and agree with radiology impression as follows:  CXR:  01/01/15  No edema or consolidation.         Assessment & Plan:   Outpatient Encounter Prescriptions as of 12/06/2015  Medication Sig  . albuterol (PROVENTIL HFA;VENTOLIN HFA) 108 (90 BASE) MCG/ACT inhaler Inhale 2 puffs into the lungs every 4 (four) hours as needed for wheezing.  Marland Kitchen atorvastatin (LIPITOR) 20 MG tablet Take 1 tablet (20 mg total) by mouth daily. (Patient taking differently: Take 10 mg by mouth daily. )  . buPROPion (WELLBUTRIN XL) 150 MG 24 hr tablet Take 150 mg by mouth daily.  . Calcium-Vitamin D 500-125 MG-UNIT TABS Take 1 tablet by mouth daily.  . cholecalciferol (VITAMIN D) 1000 UNITS tablet Take 1,000 Units by mouth daily.  . cyanocobalamin 1000 MCG tablet Take 1,000 mcg by mouth daily.  Marland Kitchen escitalopram (LEXAPRO) 20 MG tablet Take 20 mg by mouth daily.  . famotidine (PEPCID) 20 MG tablet One at bedtime  . levothyroxine (SYNTHROID, LEVOTHROID) 75 MCG tablet 1 tablet every mon, wed, fri  . levothyroxine  (SYNTHROID, LEVOTHROID) 88 MCG tablet 1 tablet every tues, thurs, sat, sun  . metoprolol succinate (TOPROL-XL) 50 MG 24 hr tablet Take 50 mg by mouth daily. Take with or immediately following a meal.  . Pyridoxine HCl (VITAMIN B-6 PO) Take by mouth daily.  . benzonatate (TESSALON) 200 MG capsule Take 200 mg by mouth 3 (three) times daily as needed for cough. Reported on 12/06/2015  . budesonide-formoterol (SYMBICORT) 80-4.5 MCG/ACT inhaler Take 2 puffs first thing in am and then another 2 puffs about 12 hours later.  Marland Kitchen omeprazole (PRILOSEC) 40 MG capsule Take  1 capsule (40 mg total) by mouth daily.  . [DISCONTINUED] mometasone-formoterol (DULERA) 100-5 MCG/ACT AERO Take 2 puffs first thing in am and then another 2 puffs about 12 hours later. (Patient not taking: Reported on 12/06/2015)   No facility-administered encounter medications on file as of 12/06/2015.

## 2015-12-07 ENCOUNTER — Encounter: Payer: Self-pay | Admitting: Internal Medicine

## 2015-12-07 MED ORDER — OMEPRAZOLE 40 MG PO CPDR: 40.0000 mg | DELAYED_RELEASE_CAPSULE | Freq: Every day | ORAL | Status: DC

## 2015-12-07 NOTE — Assessment & Plan Note (Addendum)
-   spirometry 06/27/2013   FEV1 1.92 (96%) ratio 72 and FEF 25-75 68% - Allergy eval by Harold Hedge 11/16/13  NO nl, neg allergy studies, rec topical nasal rx plus allegra - 01/24/2015  Trial of dulera 100 2bid > improved 02/18/2015  - 12/06/2015 p extensive coaching HFA effectiveness =    90%   Symptoms are markedly disproportionate to objective findings and not clear this is a lung problem but pt does appear to have difficult airway management issues. DDX of  difficult airways management almost all start with A and  include Adherence, Ace Inhibitors, Acid Reflux, Active Sinus Disease, Alpha 1 Antitripsin deficiency, Anxiety masquerading as Airways dz,  ABPA,  Allergy(esp in young), Aspiration (esp in elderly), Adverse effects of meds,  Active smokers, A bunch of PE's (a small clot burden can't cause this syndrome unless there is already severe underlying pulm or vascular dz with poor reserve) plus two Bs  = Bronchiectasis and Beta blocker use..and one C= CHF  Adherence is always the initial "prime suspect" and is a multilayered concern that requires a "trust but verify" approach in every patient - starting with knowing how to use medications, especially inhalers, correctly, keeping up with refills and understanding the fundamental difference between maintenance and prns vs those medications only taken for a very short course and then stopped and not refilled.  - - The proper method of use, as well as anticipated side effects, of a metered-dose inhaler are discussed and demonstrated to the patient. Improved effectiveness after extensive coaching during this visit to a level of approximately 75 % from a baseline of 50 % so rec continue symbicort or dulera in the lower doses so as not to exac cough     Acid (or non-acid) GERD > always difficult to exclude as up to 75% of pts in some series report no assoc GI/ Heartburn symptoms but this number rises to 100% in pt with overt HB> rec max (24h)  acid suppression and  diet restrictions/ reviewed and instructions given in writing.   ? Anxiety > usually at the bottom of this list of usual suspects but should be much higher on this pt's based on H and P and note already on psychotropics .   ? Allergy > prev w/u by Harold Hedge unrevealing so no role for steroids here   I had an extended summary  discussion with the patient reviewing all relevant studies completed to date and  lasting 15 to 20 minutes of a 25 minute visit    If responds to rx for GERD return to primary care, if not return here  Each maintenance medication was reviewed in detail including most importantly the difference between maintenance and prns and under what circumstances the prns are to be triggered using an action plan format that is not reflected in the computer generated alphabetically organized AVS.    Please see instructions for details which were reviewed in writing and the patient given a copy highlighting the part that I personally wrote and discussed at today's ov.

## 2015-12-24 DIAGNOSIS — M25551 Pain in right hip: Secondary | ICD-10-CM | POA: Diagnosis not present

## 2015-12-24 DIAGNOSIS — M7071 Other bursitis of hip, right hip: Secondary | ICD-10-CM | POA: Diagnosis not present

## 2015-12-24 DIAGNOSIS — H5203 Hypermetropia, bilateral: Secondary | ICD-10-CM | POA: Diagnosis not present

## 2016-01-08 DIAGNOSIS — L723 Sebaceous cyst: Secondary | ICD-10-CM | POA: Diagnosis not present

## 2016-01-08 DIAGNOSIS — L739 Follicular disorder, unspecified: Secondary | ICD-10-CM | POA: Diagnosis not present

## 2016-01-16 DIAGNOSIS — J45909 Unspecified asthma, uncomplicated: Secondary | ICD-10-CM | POA: Diagnosis not present

## 2016-01-16 DIAGNOSIS — F3342 Major depressive disorder, recurrent, in full remission: Secondary | ICD-10-CM | POA: Diagnosis not present

## 2016-01-16 DIAGNOSIS — I10 Essential (primary) hypertension: Secondary | ICD-10-CM | POA: Diagnosis not present

## 2016-01-16 DIAGNOSIS — E782 Mixed hyperlipidemia: Secondary | ICD-10-CM | POA: Diagnosis not present

## 2016-02-13 ENCOUNTER — Telehealth: Payer: Self-pay | Admitting: Internal Medicine

## 2016-02-13 NOTE — Telephone Encounter (Signed)
Patient states that she will call next week

## 2016-02-13 NOTE — Telephone Encounter (Signed)
Requested Prescriptions     Pending Prescriptions Disp Refills    LevoTHYROxine (SYNTHROID) 112 mcg Tablet [Pharmacy Med Name: L-THYROXINE TABS 112MCG]  2     Sig: Take 1 tablet by mouth every morning before a meal.

## 2016-02-13 NOTE — Telephone Encounter (Signed)
She Needs a followup

## 2016-02-25 DIAGNOSIS — F331 Major depressive disorder, recurrent, moderate: Secondary | ICD-10-CM | POA: Diagnosis not present

## 2016-02-25 DIAGNOSIS — I10 Essential (primary) hypertension: Secondary | ICD-10-CM | POA: Diagnosis not present

## 2016-03-11 ENCOUNTER — Other Ambulatory Visit: Payer: Self-pay | Admitting: Cardiovascular Disease

## 2016-03-11 DIAGNOSIS — E785 Hyperlipidemia, unspecified: Principal | ICD-10-CM

## 2016-03-14 NOTE — Telephone Encounter (Signed)
Last saw Dr Martina SinnerA Browning 2 /2016   Further refills by PCP  or appointment with us

## 2016-03-24 DIAGNOSIS — F3341 Major depressive disorder, recurrent, in partial remission: Secondary | ICD-10-CM | POA: Diagnosis not present

## 2016-05-18 ENCOUNTER — Other Ambulatory Visit: Payer: Self-pay | Admitting: Internal Medicine

## 2016-05-18 DIAGNOSIS — Z1231 Encounter for screening mammogram for malignant neoplasm of breast: Secondary | ICD-10-CM

## 2016-05-26 ENCOUNTER — Ambulatory Visit
Admission: RE | Admit: 2016-05-26 | Discharge: 2016-05-26 | Disposition: A | Discharge status: Home / Self Care | Payer: Medicare Other | Source: Ambulatory Visit | Attending: Internal Medicine | Admitting: Internal Medicine

## 2016-05-26 DIAGNOSIS — Z1231 Encounter for screening mammogram for malignant neoplasm of breast: Secondary | ICD-10-CM | POA: Diagnosis not present

## 2016-06-08 DIAGNOSIS — R251 Tremor, unspecified: Secondary | ICD-10-CM | POA: Diagnosis not present

## 2016-06-08 DIAGNOSIS — F3341 Major depressive disorder, recurrent, in partial remission: Secondary | ICD-10-CM | POA: Diagnosis not present

## 2016-06-08 DIAGNOSIS — F419 Anxiety disorder, unspecified: Secondary | ICD-10-CM | POA: Diagnosis not present

## 2016-06-13 NOTE — Progress Notes (Signed)
Subjective:   Suzanne Galloway was seen in consultation in the movement disorder clinic at the request of Irven Shelling, MD.  The evaluation is for tremor.  The records that were made available to me were reviewed.  Pt presents for evaluation of tremor, mild headache and balance change.  Reports that sx's started earlier in 2016, after a hospitalization in 12/2014.  After the hospitalization, she states that she felt a little uncoordinated, "like my legs don't want to go where they are told."  Tremor is intermittent.  "I am solid today."  It is in the right hand.  It is with use of the hand.  No trouble with ADL's, or eating soup.  States that she also has a feeling of "deja vu" and when she has that she will feel near syncopal.  She will feel like "ice water is running through her veins."  She feels like "in the old days when you would be given ether, and right before you would pass out."  That may happen one time a week or it may be several weeks before she has them.  She started dulera in 01/2015.     Tremor characteristics: Affected by caffeine:  No. (2 cups coffee, 2 coke per day) Affected by alcohol:  Doesn't drink enough to know Affected by stress:  Unsure but under lots of stress Affected by fatigue:  Yes.   Spills soup if on spoon:  No. Spills glass of liquid if full:  No. Affects ADL's (tying shoes, brushing teeth, etc):  No.  I reviewed her MRI of the brain from 07/26/15 and there was a moderate amount of small vessel disease.  She has a hx of hyperlipidemia.    06/16/16 update:  The records that were made available to me were reviewed.  It's been almost a year since I have seen her. She is accompanied by her daughter who supplements the history.   At that time, she was c/o tremor but I didn't really see any on examination.  She was also c/o deja vu episodes.  She had a routine EEG that was negative.  She called several times with "seizures" and said that her seizures consisted of "ice  through the veins and hyper deja vu."  She then had a 48 hour ambulatory EEG that was negative.  She reports that when her dulera was d/c the deja vu spells went away.  Today, however, she states that these came back about 3 weeks ago.  They occur daily and up to 2-3 times a day.  They can awaken her up out of sleep.  Pt/daughter also c/o tremor "slightly" when she is using the hands.  No tremor at rest.  Seems to be both hands, but the R is worse than the L.  She takes symbicort daily and albuterol about 1 time a month.  Daughter also c/o a "quiver" in pts voice but not a loss of voice.  Pt states that she worries about PD, and "I just want this ruled out."  Her mother, brother and paternal GM had PD.  She did have an EMG after last visit.  No evidence of large fiber PN but here was evidence of chronic L2-5 radiculopathy.  Daughter does state that depression is increasing but PCP didn't want to increase/change meds given other things going on.  Also c/o memory change (daughter does) but there is lots of family stress (daughter has breast CA/other has RA) and they are planning to see Dr.  Gutterman.    Outside reports reviewed: historical medical records and radiology reports.  Allergies  Allergen Reactions  . Dilaudid  [Hydromorphone] Other (See Comments)  . Dulera [Mometasone Furo-Formoterol Fum]     Possible deja vu seizures  . Lactose Intolerance (Gi) Diarrhea  . Morphine And Related Nausea And Vomiting  . Percocet [Oxycodone-Acetaminophen] Other (See Comments)    hallucinations  . Pravastatin Other (See Comments)  . Vesicare [Solifenacin]     constipation    Outpatient Encounter Prescriptions as of 06/16/2016  Medication Sig  . albuterol (PROVENTIL HFA;VENTOLIN HFA) 108 (90 BASE) MCG/ACT inhaler Inhale 2 puffs into the lungs every 4 (four) hours as needed for wheezing.  Marland Kitchen atorvastatin (LIPITOR) 20 MG tablet Take 1 tablet (20 mg total) by mouth daily. (Patient taking differently: Take 10 mg by  mouth daily. )  . budesonide-formoterol (SYMBICORT) 80-4.5 MCG/ACT inhaler Take 2 puffs first thing in am and then another 2 puffs about 12 hours later.  Marland Kitchen buPROPion (WELLBUTRIN XL) 150 MG 24 hr tablet Take 150 mg by mouth daily.  . Calcium-Vitamin D 500-125 MG-UNIT TABS Take 1 tablet by mouth daily.  . cetirizine (ZYRTEC) 10 MG tablet Take 10 mg by mouth daily.  . cholecalciferol (VITAMIN D) 1000 UNITS tablet Take 1,000 Units by mouth daily.  . cyanocobalamin 1000 MCG tablet Take 1,000 mcg by mouth daily.  Marland Kitchen escitalopram (LEXAPRO) 20 MG tablet Take 20 mg by mouth daily.  . famotidine (PEPCID) 20 MG tablet One at bedtime  . levothyroxine (SYNTHROID, LEVOTHROID) 75 MCG tablet 1 tablet every mon, wed, fri  . levothyroxine (SYNTHROID, LEVOTHROID) 88 MCG tablet 1 tablet every tues, thurs, sat, sun  . metoprolol succinate (TOPROL-XL) 50 MG 24 hr tablet Take 50 mg by mouth daily. Take with or immediately following a meal.  . [DISCONTINUED] benzonatate (TESSALON) 200 MG capsule Take 200 mg by mouth 3 (three) times daily as needed for cough. Reported on 12/06/2015  . [DISCONTINUED] omeprazole (PRILOSEC) 40 MG capsule Take 1 capsule (40 mg total) by mouth daily.  . [DISCONTINUED] Pyridoxine HCl (VITAMIN B-6 PO) Take by mouth daily.   No facility-administered encounter medications on file as of 06/16/2016.     Past Medical History:  Diagnosis Date  . Arthritis    "hands, back, toes" (01/01/2015)  . Asthma   . Chronic bronchitis ([place])    "get it just about q yr" (01/01/2015)  . Depression   . GERD (gastroesophageal reflux disease)   . High cholesterol   . Hyperlipemia   . Hypothyroidism   . Mitral valve prolapse   . Urinary incontinence     Past Surgical History:  Procedure Laterality Date  . BACK SURGERY    . CARPAL TUNNEL RELEASE Bilateral   . CYSTECTOMY Right 09/2014   "hand"   . DILATION AND CURETTAGE OF UTERUS    . FINGER SURGERY Bilateral    "scraped arthritis out of thumbs"  .  INCONTINENCE SURGERY    . LAPAROSCOPIC CHOLECYSTECTOMY    . LUMBAR LAMINECTOMY  1987; 12/2009   ; Archie Endo 01/16/2010  . TOE SURGERY Left    "took bone out of 2nd toe; took cyst out"  . TUBAL LIGATION    . VAGINAL HYSTERECTOMY  1970    Social History   Social History  . Marital status: Widowed    Spouse name: N/A  . Number of children: 3  . Years of education: N/A   Occupational History  . Retired     [employer]    Social History Main Topics  . Smoking status: Never Smoker  . Smokeless tobacco: Never Used  . Alcohol use Yes     Comment: 01/01/2015 "mixed drink q 2 months or so"  . Drug use: No  . Sexual activity: Not Currently   Other Topics Concern  . Not on file   Social History Narrative  . No narrative on file    Family Status  Relation Status  . Father Deceased   COPD, esophageal cancer, heart disease  . Brother Alive   aneurysm  . Mother Deceased   PD    Review of Systems Admits to cramping in the toes and feet.  Admits to burning pain in the feet.  Admits to trouble with grasping with hands.  A complete 10 system ROS was obtained and was negative apart from what is mentioned.   Objective:   VITALS:   Vitals:   06/16/16 0817  BP: 140/82  Pulse: 65  Weight: 149 lb (67.6 kg)  Height: 5' 2.5" (1.588 m)   Gen:  Appears stated age and in NAD. HEENT:  Normocephalic, atraumatic. The mucous membranes are moist. The superficial temporal arteries are without ropiness or tenderness. Cardiovascular: Regular rate and rhythm. Lungs: Clear to auscultation bilaterally. Neck: There are no carotid bruits noted bilaterally.  NEUROLOGICAL:  Orientation:  The patient is alert and oriented x 3.  Recent and remote memory are intact.  Attention span and concentration are normal.  Able to name objects and repeat without trouble.  Fund of knowledge is appropriate Cranial nerves: There is good facial symmetry. The pupils are equal round and reactive to light bilaterally.  Fundoscopic exam reveals clear disc margins bilaterally. Extraocular muscles are intact and visual fields are full to confrontational testing. Speech is fluent and clear. Soft palate rises symmetrically and there is no tongue deviation. Hearing is intact to conversational tone. Tone: Tone is good throughout. Sensation: Sensation is intact to light touch and pinprick throughout (facial, trunk, extremities). Vibration is intact at the bilateral big toe but slightly decreased. There is no extinction with double simultaneous stimulation. There is no sensory dermatomal level identified. Coordination:  The patient has no dysdiadichokinesia or dysmetria. Motor: Strength is 5/5 in the bilateral upper and lower extremities.  Shoulder shrug is equal bilaterally.  There is no pronator drift.  There are no fasciculations noted. DTR's: Deep tendon reflexes are 2/4 at the bilateral biceps, triceps, brachioradialis, 2+ at the bilateral patella and 1/4 at the bilateral achilles.  Plantar responses are downgoing bilaterally. Gait and Station: The patient is able to ambulate without difficulty. The patient is able to heel toe walk without any difficulty. The patient has mild difficulty in relating a tandem fashion.  The patient is able to stand in the Romberg position.   MOVEMENT EXAM: Tremor:  There is very minimal tremor of the outstretched hands.  It doesn't increase with intention.  No obvious tremor with archimedes spirals.   Lab Results  Component Value Date   [id number] (H) 08/05/2015   Lab Results  Component Value Date   HGBA1C 6.3 (H) 01/03/2015    I reviewed his labs from her primary care physician from the end of September, 2016.  Her hemoglobin A1c was rechecked and it was 6.0.  Her TSH was normal at 1.60.  Assessment/Plan:   1.  Tremor, by history.  -I saw very little tremor today.    I suspect that intermittent tremor may be from her asthma  medications (symbicort and prn albuterol).  More  importantly, I saw no evidence of PD and she was worried about that.   2.  Deja vu spells  -EEG and ambulatory EEG negative.  Pt reports that spells went away when dulera d/c but have come back.  My suspicion for seizure is very low and I wonder if these are anxiety induced.  We had a long discussion about this.  While they initially told me that these were daily and I said that we could do the EEG again, the patient stated that she was free of spells for over a week and that was set off by fragrance of deodorant.  Pt admitted that she also thinks that these are anxiety/stress induced and thinks that perhaps they are better for the last week because she finally told Dr. Laurann Montana that she was "going to break" and he offered her counseling.   3.  Memory loss  -suspect pseudodementia from underlying anxiety/depression.  We discussed neuropsych testing.  She is going to be setting up counseling with [facility]  Encouraged her to set that up. 4.  Much greater than 50% of this visit was spent in counseling and coordinating care.  Total face to face time:  25 min

## 2016-06-16 ENCOUNTER — Ambulatory Visit (INDEPENDENT_AMBULATORY_CARE_PROVIDER_SITE_OTHER): Payer: Medicare Other | Admitting: Neurology

## 2016-06-16 ENCOUNTER — Encounter: Payer: Self-pay | Admitting: Neurology

## 2016-06-16 VITALS — BP 140/82 | HR 65 | Ht 62.5 in | Wt 149.0 lb

## 2016-06-16 DIAGNOSIS — R413 Other amnesia: Secondary | ICD-10-CM

## 2016-06-16 DIAGNOSIS — R251 Tremor, unspecified: Secondary | ICD-10-CM | POA: Diagnosis not present

## 2016-06-16 DIAGNOSIS — F419 Anxiety disorder, unspecified: Secondary | ICD-10-CM | POA: Diagnosis not present

## 2016-06-16 NOTE — Patient Instructions (Signed)
1.  Tremor is not due to parkinsons disease and is likely due to asthma/lung medications 2.  We we get you scheduled for the memory testing caused neuropsych testing.  Dr. Si Raider will be the one doing this 3.  I do not think that your deja vu spells are seizure

## 2016-06-23 ENCOUNTER — Encounter: Payer: Self-pay | Admitting: Psychology

## 2016-06-23 ENCOUNTER — Ambulatory Visit (INDEPENDENT_AMBULATORY_CARE_PROVIDER_SITE_OTHER): Payer: Medicare Other | Admitting: Psychology

## 2016-06-23 DIAGNOSIS — F329 Major depressive disorder, single episode, unspecified: Secondary | ICD-10-CM

## 2016-06-23 DIAGNOSIS — R413 Other amnesia: Secondary | ICD-10-CM

## 2016-06-23 DIAGNOSIS — F32A Depression, unspecified: Secondary | ICD-10-CM

## 2016-06-23 DIAGNOSIS — F419 Anxiety disorder, unspecified: Secondary | ICD-10-CM

## 2016-06-23 NOTE — Progress Notes (Signed)
NEUROPSYCHOLOGICAL INTERVIEW (CPT: [id number])  Name: Suzanne Galloway Date of Birth: 06/08/1941 Date of Interview: 06/23/2016  Reason for Referral:  Suzanne Galloway is a 75 y.o. female who is referred for neuropsychological evaluation by Dr. Wells Guiles Tat of [facility] due to concerns about memory loss. This patient is accompanied in the office by her daughter, [name], who supplements the history.   History of Presenting Problem:  Ms. Sassone reported gradual onset and progressive worsening of short-term memory loss over the past 2 years approximately. She endorsed some fluctuation of symptoms, feeling "sharper" on some days but "more days I'm not". The specific symptom that is most bothersome to the patient is word finding difficulty. Her daughter acknowledged mild degradation in memory over time but noted that she is not as concerned about this as the patient is. She reported patient becomes very bothered by seemingly small symptoms/memory lapses. The patient reported that recently person came up to her in a restaurant and hugged her and talked to her, and the patient had no recall of who this person was. It was not until the person introduced herself to the patient's daughter, who was with her, that she realized it was a family physician who she apparently sees once a month. Her daughter reported that she was not as concerned about this situation because she was seeing the person out of context. Overall, the patient's daughter feels that the patient's memory lapses could be related to her high level of stress at the present time. Currently, the patient is caring for her daughter who has breast cancer. She is taking her daughter to multiple appointments each week for cancer treatment. Ms. Boy reported that she is generally a happy person but that she is experiencing more sadness lately. She is feeling "more melancholy" with increased tearfulness. She is awaiting a call back from Dr. Orion Crook office to begin  psychotherapy with him. She has always been a person who concealed and negative emotions including sadness in the past. However, she has been having difficulty doing this lately. She reported significant angst about the possibility of losing her memory, because she wants to be able to care for her 3 daughters who each have their own medical issues. She stated, "I've got to stay bright so I can help them." Another recent stressor is the fact that the patient is selling her home. (She moved in with one of her daughters and her family 1-1/2 years ago.) The patient has been taking escitalopram and bupropion for several years. She reported that she also had counseling back in the 1980s which she found helpful.  As discussed in Dr. Doristine Devoid note from her 06/16/16 visit, the patient complained of dj vu episodes starting about a year ago. She reported a split second of dj vu followed by a rush of heat through her body and feeling as though she would pass out. She reported this was very bothersome to her. She noticed that a side effect listed of one of her asthma medications (Delura) was "dj vu seizures". She noted that the dj vu episodes started rather immediately upon starting that medication in April 2016. She stopped taking the medication and the dj vu episodes also stopped. However, they did start back up again at some point afterwards. An EEG was completed and was normal. She reportedly went to see her primary care physician, Dr. Laurann Montana, and when she was reassured by him that she was likely not having seizures, the episodes stopped again. She has not  had an episode in the past 2 weeks.  The patient also became concerned about tremor in her hands. Dr. Doristine Devoid evaluation on 06/16/16 did not reveal evidence of Parkinson's disease.  An MRI of the brain from 07/26/2015 reportedly showed moderate small vessel disease.  Upon direct questioning, the patient and her daughter reported the following:  Forgetting  recent conversations/events: No Misplacing/losing items: Yes  Forgetting appointments or other obligations: No Forgetting to take medications: No  Difficulty concentrating: Reduced when reading, and the patient is an avid reader. Starting but not finishing tasks: No Word-finding difficulty: Yes Getting lost when driving: No Making wrong turns when driving: Some attention lapses  Current Functioning: The patient continues to manage all complex ADLs independently, including driving, medications, finances and bill paying, appointments and cooking.  With regard to physical functioning, the patient does endorse some hip pain secondary to bursitis. She endorsed mildly reduced balance. She denied history of falls.  She denied any difficulties with sleep. She reported good appetite. She denied any history of hallucinations. She denied past or present suicidal ideation or intention. No imminent risk of self-harm was identified.  The patient volunteers with [employer] but she did recently have to take a break because of her daughters treatment. She plans to return in October.  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 Divorced from the first 3 marriages. Widowed since 2013. Was with Juanda Crumble for 23 years ("that was the good one") Children: 3 daughters and 5 grandchildren. The patient reports that her family is very close-knit and supportive. Alcohol/Tobacco/Substances: Minimal/social alcohol use, never been a heavy drinker. No history of tobacco use or substance abuse.     Medical History: Past Medical History:  Diagnosis Date  . Arthritis    "hands, back, toes" (01/01/2015)  . Asthma   .  Chronic bronchitis ([place])    "get it just about q yr" (01/01/2015)  . Depression   . GERD (gastroesophageal reflux disease)   . High cholesterol   . Hyperlipemia   . Hypothyroidism   . Mitral valve prolapse   . Urinary incontinence     Current Medications:  Outpatient Encounter Prescriptions as of 06/23/2016  Medication Sig  . albuterol (PROVENTIL HFA;VENTOLIN HFA) 108 (90 BASE) MCG/ACT inhaler Inhale 2 puffs into the lungs every 4 (four) hours as needed for wheezing.  Marland Kitchen atorvastatin (LIPITOR) 20 MG tablet Take 1 tablet (20 mg total) by mouth daily. (Patient taking differently: Take 10 mg by mouth daily. )  . budesonide-formoterol (SYMBICORT) 80-4.5 MCG/ACT inhaler Take 2 puffs first thing in am and then another 2 puffs about 12 hours later.  Marland Kitchen buPROPion (WELLBUTRIN XL) 150 MG 24 hr tablet Take 150 mg by mouth daily.  . Calcium-Vitamin D 500-125 MG-UNIT TABS Take 1 tablet by mouth daily.  . cetirizine (ZYRTEC) 10 MG tablet Take 10 mg by mouth daily.  . cholecalciferol (VITAMIN D) 1000 UNITS tablet Take 1,000 Units by mouth daily.  . cyanocobalamin 1000 MCG tablet Take 1,000 mcg by mouth daily.  Marland Kitchen escitalopram (LEXAPRO) 20 MG tablet Take 20 mg by mouth daily.  . famotidine (PEPCID) 20 MG tablet One at bedtime  . levothyroxine (SYNTHROID, LEVOTHROID) 75 MCG tablet 1 tablet every mon, wed, fri  . levothyroxine (  SYNTHROID, LEVOTHROID) 88 MCG tablet 1 tablet every tues, thurs, sat, sun  . metoprolol succinate (TOPROL-XL) 50 MG 24 hr tablet Take 50 mg by mouth daily. Take with or immediately following a meal.   No facility-administered encounter medications on file as of 06/23/2016.     Behavioral Observations:   Appearance: Neatly and appropriately dressed Gait: Ambulated independently, no abnormalities observed Speech: Fluent; normal rate, rhythm and volume.  Thought process: Linear, goal directed. Affect: Full, appropriate Interpersonal: Very pleasant, appropriate   TESTING: There is  medical necessity to proceed with neuropsychological assessment as the results will be used to aid in differential diagnosis and clinical decision-making and to inform specific treatment recommendations. Per the patient, her daughter and medical records reviewed, there has been a change in cognitive functioning and a reasonable suspicion of mild cognitive impairment or dementia. Additionally, there is a need for objective assessment of the patient's subjective memory concerns in order to determine psychogenic versus neurologic etiology.   PLAN: The patient will return for a full battery of neuropsychological testing with a psychometrician under my supervision. Education regarding testing procedures was provided. Subsequently, the patient will see this provider for a follow-up session at which time her test performances and my impressions and treatment recommendations will be reviewed in detail.   Full neuropsychological evaluation report to follow.

## 2016-06-24 DIAGNOSIS — Z23 Encounter for immunization: Secondary | ICD-10-CM | POA: Diagnosis not present

## 2016-06-29 ENCOUNTER — Ambulatory Visit (INDEPENDENT_AMBULATORY_CARE_PROVIDER_SITE_OTHER): Payer: Medicare Other | Admitting: Psychology

## 2016-06-29 DIAGNOSIS — R413 Other amnesia: Secondary | ICD-10-CM

## 2016-06-29 NOTE — Progress Notes (Signed)
   Neuropsychology Note  Suzanne Galloway returned today for 2 hours of neuropsychological testing with technician, Milana Kidney, BS, under the supervision of Dr. Macarthur Critchley. The patient did not appear overtly distressed by the testing session, per behavioral observation or via self-report to the technician. Rest breaks were offered. Suzanne Galloway will return within 2 weeks for a feedback session with Dr. Si Raider at which time her test performances, clinical impressions and treatment recommendations will be reviewed in detail. The patient understands she can contact our office should she require our assistance before this time.  Full report to follow.

## 2016-07-13 DIAGNOSIS — F419 Anxiety disorder, unspecified: Secondary | ICD-10-CM | POA: Diagnosis not present

## 2016-07-14 DIAGNOSIS — H2513 Age-related nuclear cataract, bilateral: Secondary | ICD-10-CM | POA: Diagnosis not present

## 2016-07-14 DIAGNOSIS — H524 Presbyopia: Secondary | ICD-10-CM | POA: Diagnosis not present

## 2016-07-15 NOTE — Progress Notes (Addendum)
NEUROPSYCHOLOGICAL EVALUATION   Name:    Suzanne Galloway  Date of Birth:   27-Jan-1941 Date of Interview:  06/23/2016 Date of Testing:  06/29/2016   Date of Feedback:  07/16/2016       Background Information:  Reason for Referral:  Suzanne Galloway is a 75 y.o. female referred by Dr. Wells Guiles Tat to assess her current level of cognitive functioning and assist in differential diagnosis. The current evaluation consisted of a review of available medical records, an interview with the patient and her daughter, Suzanne Galloway, and the completion of a neuropsychological testing battery. Informed consent was obtained.  History of Presenting Problem:  [name] reported gradual onset and progressive worsening of short-term memory loss over the past 2 years approximately. She endorsed some fluctuation of symptoms, feeling "sharper" on some days but "more days I'm not". The specific symptom that is most bothersome to the patient is word finding difficulty. Her daughter acknowledged mild degradation in memory over time but noted that she is not as concerned about this as the patient is. She reported patient becomes very bothered by seemingly small symptoms/memory lapses. The patient reported that recently person came up to her in a restaurant and hugged her and talked to her, and the patient had no recall of who this person was. It was not until the person introduced herself to the patient's daughter, who was with her, that she realized it was a family physician who she apparently sees once a month. Her daughter reported that she was not as concerned about this situation because she was seeing the person out of context. Overall, the patient's daughter feels that the patient's memory lapses could be related to her high level of stress at the present time. Currently, the patient is caring for her daughter who has breast cancer. She is taking her daughter to multiple appointments each week for cancer treatment. [name] reported that  she is generally a happy person but that she is experiencing more sadness lately. She is feeling "more melancholy" with increased tearfulness. She is awaiting a call back from Dr. Orion Crook office to begin psychotherapy with him. She has always been a person who concealed negative emotions including sadness in the past. However, she has been having difficulty doing this lately. She reported significant angst about the possibility of losing her memory, because she wants to be able to care for her 3 daughters who each have their own medical issues. She stated, "I've got to stay bright so I can help them." Another recent stressor is the fact that the patient is selling her home. (She moved in with one of her daughters and her family 1-1/2 years ago.) The patient has been taking escitalopram and bupropion for several years. She reported that she also had counseling back in the 1980s which she found helpful.  As discussed in Dr. Doristine Devoid note from her 06/16/16 visit, the patient complained of dj vu episodes starting about a year ago. She reported a split second of dj vu followed by a rush of heat through her body and feeling as though she would pass out. She reported this was very bothersome to her. She noticed that a side effect listed of one of her asthma medications (Delura) was "dj vu seizures". She noted that the dj vu episodes started rather immediately upon starting that medication in April 2016. She stopped taking the medication and the dj vu episodes also stopped. However, they did start back up again at some  point afterwards. An EEG was completed and was normal. She reportedly went to see her primary care physician, Dr. Laurann Montana, and when she was reassured by him that she was likely not having seizures, the episodes stopped again. She has not had an episode in the past 2 weeks.  The patient also became concerned about tremor in her hands. Dr. Doristine Devoid evaluation on 06/16/16 did not reveal evidence of  Parkinson's disease.  An MRI of the brain from 07/26/2015 reportedly showed moderate small vessel disease.  Upon direct questioning, the patient and her daughter reported the following:  Forgetting recent conversations/events: No Misplacing/losing items: Yes  Forgetting appointments or other obligations: No Forgetting to take medications: No  Difficulty concentrating: Reduced when reading, and the patient is an avid reader. Starting but not finishing tasks: No Word-finding difficulty: Yes Getting lost when driving: No Making wrong turns when driving: Some attention lapses  Current Functioning: The patient continues to manage all complex ADLs independently, including driving, medications, finances and bill paying, appointments and cooking.  With regard to physical functioning, the patient does endorse some hip pain secondary to bursitis. She endorsed mildly reduced balance. She denied history of falls.  She denied any difficulties with sleep. She reported good appetite. She denied any history of hallucinations. She denied past or present suicidal ideation or intention. No imminent risk of self-harm was identified.  The patient volunteers with [employer] but she did recently have to take a break because of her daughters treatment. She plans to return in October.  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 Divorced from the first 3 marriages. Widowed since 2013. Was with Suzanne Galloway for 23 years ("that was the good one") Children: 3 daughters and 5 grandchildren. The patient reports that her family is very close-knit and supportive. Alcohol/Tobacco/Substances: Minimal/social alcohol use,  never been a heavy drinker. No history of tobacco use or substance abuse.     Medical History:  Past Medical History:  Diagnosis Date  . Arthritis    "hands, back, toes" (01/01/2015)  . Asthma   . Chronic bronchitis ([place])    "get it just about q yr" (01/01/2015)  . Depression   . GERD (gastroesophageal reflux disease)   . High cholesterol   . Hyperlipemia   . Hypothyroidism   . Mitral valve prolapse   . Urinary incontinence     Current medications:  Outpatient Encounter Prescriptions as of 07/16/2016  Medication Sig  . albuterol (PROVENTIL HFA;VENTOLIN HFA) 108 (90 BASE) MCG/ACT inhaler Inhale 2 puffs into the lungs every 4 (four) hours as needed for wheezing.  Marland Kitchen atorvastatin (LIPITOR) 20 MG tablet Take 1 tablet (20 mg total) by mouth daily. (Patient taking differently: Take 10 mg by mouth daily. )  . budesonide-formoterol (SYMBICORT) 80-4.5 MCG/ACT inhaler Take 2 puffs first thing in am and then another 2 puffs about 12 hours later.  Marland Kitchen buPROPion (WELLBUTRIN XL) 150 MG 24 hr tablet Take 150 mg by mouth daily.  . Calcium-Vitamin D 500-125 MG-UNIT TABS Take 1 tablet by mouth daily.  . cetirizine (ZYRTEC) 10 MG tablet Take 10 mg by mouth daily.  . cholecalciferol (VITAMIN D) 1000 UNITS tablet Take 1,000 Units by mouth daily.  . cyanocobalamin 1000 MCG tablet Take 1,000 mcg by  mouth daily.  Marland Kitchen escitalopram (LEXAPRO) 20 MG tablet Take 20 mg by mouth daily.  . famotidine (PEPCID) 20 MG tablet One at bedtime  . levothyroxine (SYNTHROID, LEVOTHROID) 75 MCG tablet 1 tablet every mon, wed, fri  . levothyroxine (SYNTHROID, LEVOTHROID) 88 MCG tablet 1 tablet every tues, thurs, sat, sun  . metoprolol succinate (TOPROL-XL) 50 MG 24 hr tablet Take 50 mg by mouth daily. Take with or immediately following a meal.   No facility-administered encounter medications on file as of 07/16/2016.      Current Examination:  Behavioral Observations:   Appearance: Neatly and appropriately dressed Gait:  Ambulated independently, no abnormalities observed Speech: Fluent; normal rate, rhythm and volume.  Thought process: Linear, goal directed. Affect: Full, appropriate Interpersonal: Very pleasant, appropriate Orientation: Oriented to all spheres. Accurately named the current President and his predecessor.  Tests Administered: . Test of Premorbid Functioning (TOPF) . Wechsler Adult Intelligence Scale-Fourth Edition (WAIS-IV): Similarities, Block Design, Matrix Reasoning, Coding and Digit Span subtests . [employer] Verbal Learning Test - 2nd Edition (CVLT-2) Short Form . Repeatable Battery for the Assessment of Neuropsychological Status (RBANS) Form A:  Figure Copy and Recall subtests, Story Memory and Recall subtests . Neuropsychological Assessment Battery (NAB) Language Module, Form 1: Naming Subtest . Controlled Oral Word Association Test (COWAT) . Trail Making Test A and B . Clock drawing test . Geriatric Depression Scale (GDS) 15 Item . Generalized Anxiety Disorder - 7 item screener (GAD-7) . Beck Depression Inventory - Second edition (BDI-II)  Test Results: Note: Standardized scores are presented only for use by appropriately trained professionals and to allow for any future test-retest comparison. These scores should not be interpreted without consideration of all the information that is contained in the rest of the report. The most recent standardization samples from the test publisher or other sources were used whenever possible to derive standard scores; scores were corrected for age, gender, ethnicity and education when available.   Test Scores:  Test Name Standardized Score Descriptor  TOPF SS= 87 Low average  WAIS-IV Subtests    Similarities ss= 9 Average  Block Design ss= 10 Average  Matrix Reasoning ss= 8 Average  Coding ss= 8 Average  Digit Span ss= 13 High average  RBANS Subtests    Figure Copy Z= -2.1 Impaired  Figure Recall Z= -2.3 Impaired  Story Memory Z= -1.8  Borderline  Story Recall Z= -1.4 Borderline  CVLT-II Scores    Trial 1 Z= -1 Low average  Trial 4 Z= 0 Average  Trials 1-4 total T= 47 Average  SD Free Recall Z= 0.5 Average  LD Free Recall Z= 1 High average  LD Cued Recall Z= 0.5 Average  Recognition Discriminability (9/9 hits, 1 false positives) Z= 0.5 Average  Forced Choice Recognition Raw= 9/9 WNL  NAB Naming T= 59 High average  COWAT-FAS T= 52 Average  COWAT-Animals T= 63 High average  Trail Making Test A 0 errors T= 54 Average  Trail Making Test B 2 errors T= 38 Low average  Clock Drawing  WNL  GDS-15 4/15 WNL   GAD-7 7/21 Mild   BDI-II 13/63 WNL     Description of Test Results:  Premorbid verbal intellectual abilities were estimated to have been within the low average range based on a test of word reading. Psychomotor processing speed was average. Auditory attention and working memory were high average. Visual-spatial construction was variable. Specifically, her ability to manipulate three dimensional blocks to match a model fell solidly in the average  range, while her drawn copy of a complex geometric figure was impaired. Qualitatively, her performance on the latter appeared due to imprecision of details rather than perceptual deficits. Language abilities were intact. Specifically, confrontation naming was high average, and semantic verbal fluency was high average. With regard to verbal memory, encoding and acquisition of non-contextual information (i.e., word list) was average. After a brief distracter task, free recall was average. After a delay, free recall was high average. Performance on a yes/no recognition task was average. On another verbal memory test, encoding and acquisition of contextual auditory information (i.e., short story) was borderline impaired across two learning trials. After a delay, free recall was borderline impaired. With regard to non-verbal memory, delayed free recall of visual information was impaired.  Executive functioning was intact overall. Mental flexibility and set-shifting were low average on Trails B. Verbal fluency with phonemic search restrictions was average. Both verbal and non-verbal abstract reasoning were average. Performance on a clock drawing task was within normal limits. On self-report questionnaires, the patient's responses were  indicative of mild generalized anxiety characterized by difficulty relaxing, nervousness, excessive worrying, inability to control worrying, restlessness, and fear of the worst happening. She did not endorse a clinically significant level of depressive symptomatology but she did report experiencing several symptoms to a mild degree, including pessimism, anhedonia, guilty feelings, self-criticalness, constricted affect, restlessness, increased appetite, increased sleep, reduced energy, fatigue, and concentration difficulty. She denied suicidal ideation or intention.   Clinical Impressions: Adjustment disorder with mixed depressed mood and anxiety.  Results of cognitive testing were largely within normal limits. Auditory attention/working memory, language (i.e., semantic retrieval), and verbal memory retrieval were even above average. She did demonstrate some variability on memory tests, but performed well on the most robust test of learning and memory (CVLT). Her pattern of performances suggests she greatly benefits from repetition of to-be-learned material. There is no evidence from this evaluation suggest the presence of dementia or Alzheimer's disease at this time.  The patient is expressing mild symptoms of depression and anxiety that are related to current stressors and as such she meets diagnostic criteria for adjustment disorder. It is my clinical opinion that her subjective cognitive complaints are secondary to this increased level of psychosocial stress and associated adjustment reaction.     Recommendations/Plan: Based on the findings of the present  evaluation, the following recommendations are offered:  1. Ms. Terzian will benefit from education regarding the impact of mood/stress on cognitive functions in daily life, which I will review with her at the follow-up session. 2. I believe Ms. Nolette is an excellent candidate for individual therapy to assist her in processing and managing her increased stress. I understand she has begun seeing a local provider and I commended her for doing this. 3. The results of this evaluation provide a nice baseline for future comparison, if needed.    Feedback to Patient: KIIRA DOMINO returned for a feedback appointment on 07/16/2016 to review the results of her neuropsychological evaluation with this provider. 30 minutes face-to-face time was spent reviewing her test results, my impressions and my recommendations as detailed above.    Total time spent on this patient's case: [id number] unit for interview with psychologist; [phone] units of testing by psychometrician under psychologist's supervision; [phone] units for medical record review, scoring of neuropsychological tests, interpretation of test results, preparation of this report, and review of results to the patient by psychologist.      Thank you for your referral of ANAYELI FELICIANO.  Please feel free to contact me if you have any questions or concerns regarding this report.

## 2016-07-16 ENCOUNTER — Ambulatory Visit (INDEPENDENT_AMBULATORY_CARE_PROVIDER_SITE_OTHER): Payer: Medicare Other | Admitting: Psychology

## 2016-07-16 ENCOUNTER — Encounter: Payer: Self-pay | Admitting: Psychology

## 2016-07-16 DIAGNOSIS — R413 Other amnesia: Secondary | ICD-10-CM

## 2016-07-16 DIAGNOSIS — F4323 Adjustment disorder with mixed anxiety and depressed mood: Secondary | ICD-10-CM

## 2016-07-16 NOTE — Patient Instructions (Signed)
Fortunately, results of cognitive testing were entirely within normal limits and NOT indicative of a neurocognitive disorder.   It is most likely the case that your cognitive symptoms in daily life are due to stress.  Below is some more information on this.  I am hopeful that more aggressive treatment of depression and anxiety (e.g. psychotherapy) will not only improve your mood and coping, but also result in improved cognitive functioning in your daily life.    The effect of depression and anxiety on your cognitive functioning: . One of the typical symptoms of depression is difficulty concentrating and making decisions, and various types of anxiety also interfere with attention and concentration . Problems with attention and concentration can disrupt the process of learning and making new memories, which can make it seem like there is a problem with your memory. In your daily life, you may experience this disruption as forgetting names and appointments, misplacing items, and needing to make lists for shopping and errands. It may be harder for you to stay focused on tasks and feel as "sharp" as you did in the past.  . Also, when we are depressed or anxious, we often pay more attention to our difficulties (rather than our strengths) in our daily life, and this can make it seem to [place] like we are doing worse cognitively than we really are. . The cognitive aspects of depression and anxiety are sometimes observed as an identifiable pattern of poor performance on a neuropsychological evaluation, but it is also possible that all scores on an evaluation are within normal limits. . Regardless of the test scores, distress related to depression and anxiety can interfere with the ability to make use of your cognitive resources and function optimally across settings such as work or school, maintaining the home and responsibilities, and personal relationships. . Fortunately, there are treatments for depression and  anxiety, and when mood improves, cognitive functioning in daily life often improves. . Treatment options include psychotherapy, medications (e.g., antidepressants), and behavioral changes, such as increasing your involvement in enjoyable activities, increasing the amount of exercise you are getting, and maintaining a regular routine.

## 2016-07-27 DIAGNOSIS — F419 Anxiety disorder, unspecified: Secondary | ICD-10-CM | POA: Diagnosis not present

## 2016-07-31 DIAGNOSIS — E039 Hypothyroidism, unspecified: Secondary | ICD-10-CM | POA: Diagnosis not present

## 2016-07-31 DIAGNOSIS — E782 Mixed hyperlipidemia: Secondary | ICD-10-CM | POA: Diagnosis not present

## 2016-07-31 DIAGNOSIS — J45909 Unspecified asthma, uncomplicated: Secondary | ICD-10-CM | POA: Diagnosis not present

## 2016-07-31 DIAGNOSIS — Z1389 Encounter for screening for other disorder: Secondary | ICD-10-CM | POA: Diagnosis not present

## 2016-07-31 DIAGNOSIS — F3342 Major depressive disorder, recurrent, in full remission: Secondary | ICD-10-CM | POA: Diagnosis not present

## 2016-07-31 DIAGNOSIS — F419 Anxiety disorder, unspecified: Secondary | ICD-10-CM | POA: Diagnosis not present

## 2016-07-31 DIAGNOSIS — Z0000 Encounter for general adult medical examination without abnormal findings: Secondary | ICD-10-CM | POA: Diagnosis not present

## 2016-07-31 DIAGNOSIS — R7301 Impaired fasting glucose: Secondary | ICD-10-CM | POA: Diagnosis not present

## 2016-08-03 DIAGNOSIS — H2512 Age-related nuclear cataract, left eye: Secondary | ICD-10-CM | POA: Diagnosis not present

## 2016-08-11 ENCOUNTER — Ambulatory Visit: Payer: Medicare Other

## 2016-08-11 DIAGNOSIS — Z23 Encounter for immunization: Principal | ICD-10-CM

## 2016-08-11 NOTE — Nursing Note (Signed)
The Influenza Vaccine VIS document for the flu injection was given to patient to review. Patient or person named in permission has answered no to any history of egg allergy, previous serious reaction to a influenza vaccine or current illness which would preclude them receiving an immunization. Any questions were referred to the physician. Patient identified by last name and date of birth. The Influenza Vaccine was then administered per protocol. The patient was observed for immediate reactions to the vaccine per protocol. None were observed.   Jillyan Plitt Oates, LVN

## 2016-08-24 DIAGNOSIS — H2512 Age-related nuclear cataract, left eye: Secondary | ICD-10-CM | POA: Diagnosis not present

## 2016-08-24 DIAGNOSIS — H25812 Combined forms of age-related cataract, left eye: Secondary | ICD-10-CM | POA: Diagnosis not present

## 2016-09-01 DIAGNOSIS — H2511 Age-related nuclear cataract, right eye: Secondary | ICD-10-CM | POA: Diagnosis not present

## 2016-09-07 DIAGNOSIS — H2511 Age-related nuclear cataract, right eye: Secondary | ICD-10-CM | POA: Diagnosis not present

## 2016-09-07 DIAGNOSIS — H25811 Combined forms of age-related cataract, right eye: Secondary | ICD-10-CM | POA: Diagnosis not present

## 2016-09-25 DIAGNOSIS — H43813 Vitreous degeneration, bilateral: Secondary | ICD-10-CM | POA: Diagnosis not present

## 2016-10-18 DIAGNOSIS — W5501XA Bitten by cat, initial encounter: Secondary | ICD-10-CM | POA: Diagnosis not present

## 2016-10-26 DIAGNOSIS — F3341 Major depressive disorder, recurrent, in partial remission: Secondary | ICD-10-CM | POA: Diagnosis not present

## 2016-10-26 DIAGNOSIS — F419 Anxiety disorder, unspecified: Secondary | ICD-10-CM | POA: Diagnosis not present

## 2016-10-27 ENCOUNTER — Other Ambulatory Visit: Payer: Self-pay | Admitting: Cardiovascular Disease

## 2016-10-27 DIAGNOSIS — E785 Hyperlipidemia, unspecified: Principal | ICD-10-CM

## 2016-10-27 NOTE — Telephone Encounter (Signed)
Patient called and indicated that she needs to have this filled as she is out of the medication. Patient needs to have a local refill for about 10 days sent to   ?    [facility]. Patient also needs the year supply to his mail order pharmacy.     Please call patient back regarding this request and where he can get his local refill at. Patient is confused.

## 2016-10-28 ENCOUNTER — Other Ambulatory Visit: Payer: Self-pay | Admitting: Internal Medicine

## 2016-10-28 ENCOUNTER — Telehealth: Payer: Self-pay | Admitting: Internal Medicine

## 2016-10-28 DIAGNOSIS — E039 Hypothyroidism, unspecified: Principal | ICD-10-CM

## 2016-10-28 DIAGNOSIS — E7800 Pure hypercholesterolemia, unspecified: Secondary | ICD-10-CM

## 2016-10-28 DIAGNOSIS — E785 Hyperlipidemia, unspecified: Principal | ICD-10-CM

## 2016-10-28 MED ORDER — ATORVASTATIN 40 MG TABLET
40.0000 mg | ORAL_TABLET | Freq: Every day | ORAL | 0 refills | Status: DC
Start: 2016-10-28 — End: 2016-10-28

## 2016-10-28 NOTE — Telephone Encounter (Signed)
Dr. Nedra HaiLee refilled local supply, but states needs appointment with cardiology or future refills from pcp.

## 2016-10-28 NOTE — Telephone Encounter (Signed)
Patient scheduled a follow-up appointment for thyroid/cholesterol for 11/17/16 and would like to do labs prior to her appointment.  Please place orders in EMR and call patient to schedule a lab-only appointment.    Tammy CossJudy Pickett-Lopez

## 2016-10-28 NOTE — Telephone Encounter (Signed)
Last appointment with Dr Martina SinnerA Browning 2/11 16  I okayed 1 refill  Further refill by PCP or followup appointment with cardiology

## 2016-10-29 MED ORDER — ATORVASTATIN 40 MG TABLET
40.0000 mg | ORAL_TABLET | Freq: Every day | ORAL | 3 refills | Status: DC
Start: 2016-10-29 — End: 2018-03-18

## 2016-10-29 NOTE — Telephone Encounter (Signed)
Patient returned office phone call and,after positive ID verified x 3, I scheduled lab-only appointment for patient.    Andree CossJudy Pickett-Lopez

## 2016-10-29 NOTE — Telephone Encounter (Signed)
I have attempted to contact this patient by phone with the following results: left message to return my call on answering machine.    Tammy [name]

## 2016-10-29 NOTE — Telephone Encounter (Signed)
Order placed

## 2016-11-10 ENCOUNTER — Ambulatory Visit: Payer: Medicare Other | Attending: Internal Medicine

## 2016-11-10 DIAGNOSIS — E039 Hypothyroidism, unspecified: Secondary | ICD-10-CM | POA: Insufficient documentation

## 2016-11-10 DIAGNOSIS — E7800 Pure hypercholesterolemia, unspecified: Principal | ICD-10-CM | POA: Insufficient documentation

## 2016-11-10 LAB — COMPREHENSIVE METABOLIC PANEL
ALANINE TRANSFERASE (ALT): 18 U/L (ref 5–54)
ALKALINE PHOSPHATASE (ALP): 57 U/L (ref 35–115)
ASPARTATE TRANSAMINASE (AST): 22 U/L (ref 15–43)
Albumin: 3.7 g/dL (ref 3.2–4.6)
BILIRUBIN TOTAL: 0.9 mg/dL (ref 0.3–1.3)
CALCIUM: 9 mg/dL (ref 8.6–10.5)
CARBON DIOXIDE TOTAL: 28 mmol/L (ref 24–32)
CHLORIDE: 104 mmol/L (ref 95–110)
CREATININE BLOOD: 0.79 mg/dL (ref 0.44–1.27)
GLUCOSE: 98 mg/dL (ref 70–99)
POTASSIUM: 3.9 mmol/L (ref 3.3–5.0)
PROTEIN: 6.2 g/dL — AB (ref 6.3–8.3)
SODIUM: 141 mmol/L (ref 135–145)
UREA NITROGEN, BLOOD (BUN): 12 mg/dL (ref 8–22)

## 2016-11-10 LAB — CBC WITH DIFFERENTIAL
BASOPHILS % AUTO: 0.8 %
Basophils Abs Auto: 0.1 10*3/uL (ref 0.0–0.2)
EOSINOPHIL % AUTO: 4.2 %
EOSINOPHIL ABS AUTO: 0.3 10*3/uL (ref 0.0–0.5)
HEMATOCRIT: 42.7 % (ref 36.0–46.0)
Hemoglobin: 14.3 g/dL (ref 12.0–16.0)
LYMPHOCYTE ABS AUTO: 2.1 10*3/uL (ref 1.0–4.8)
LYMPHOCYTES % AUTO: 30.5 %
MCH: 30.2 pg (ref 27.0–33.0)
MCHC: 33.4 % (ref 32.0–36.0)
MCV: 90.4 UM3 (ref 80.0–100.0)
MONOCYTES % AUTO: 7.9 %
MONOCYTES ABS AUTO: 0.5 10*3/uL (ref 0.1–0.8)
MPV: 8.8 UM3 (ref 6.8–10.0)
NEUTROPHIL ABS AUTO: 3.8 10*3/uL (ref 1.8–7.7)
NEUTROPHILS % AUTO: 56.6 %
PLATELET COUNT: 271 10*3/uL (ref 130–400)
RDW: 13.6 % (ref 0.0–14.7)
RED CELL COUNT: 4.72 10*6/uL (ref 4.00–5.20)
WHITE BLOOD CELL COUNT: 6.7 10*3/uL (ref 4.5–11.0)

## 2016-11-10 LAB — LIPID PANEL
CHOLESTEROL: 209 mg/dL — AB (ref 0–200)
HDL CHOLESTEROL: 60 mg/dL (ref >=35)
LDL CHOLESTEROL CALCULATION: 119 mg/dL (ref <130)
Non-HDL Cholesterol: 149 mg/dL (ref <=150)
TOTAL CHOLESTEROL:HDL RATIO: 3.5 (ref <4.0)
TRIGLYCERIDE: 149 mg/dL (ref 35–160)

## 2016-11-10 LAB — THYROID STIMULATING HORMONE: THYROID STIMULATING HORMONE: 0.65 u[IU]/mL (ref 0.35–3.30)

## 2016-11-17 ENCOUNTER — Encounter: Payer: Self-pay | Admitting: Internal Medicine

## 2016-11-17 ENCOUNTER — Ambulatory Visit: Payer: Medicare Other | Admitting: Internal Medicine

## 2016-11-17 VITALS — BP 124/68 | HR 70 | Resp 16 | Ht 64.0 in | Wt 190.0 lb

## 2016-11-17 DIAGNOSIS — F418 Other specified anxiety disorders: Secondary | ICD-10-CM

## 2016-11-17 DIAGNOSIS — R5383 Other fatigue: Principal | ICD-10-CM

## 2016-11-17 DIAGNOSIS — E7800 Pure hypercholesterolemia, unspecified: Secondary | ICD-10-CM

## 2016-11-17 DIAGNOSIS — E039 Hypothyroidism, unspecified: Secondary | ICD-10-CM

## 2016-11-17 DIAGNOSIS — F32A Depression, unspecified: Secondary | ICD-10-CM

## 2016-11-17 DIAGNOSIS — F419 Anxiety disorder, unspecified: Secondary | ICD-10-CM

## 2016-11-17 DIAGNOSIS — F329 Major depressive disorder, single episode, unspecified: Secondary | ICD-10-CM

## 2016-11-17 MED ORDER — SERTRALINE 25 MG TABLET
25.0000 mg | ORAL_TABLET | Freq: Every day | ORAL | 11 refills | Status: DC
Start: 2016-11-17 — End: 2018-04-26

## 2016-11-17 NOTE — Progress Notes (Signed)
Chief Complaint: Patient presents today with followup on her blood work    Hyperlipidemia,   management discussed with patient, she has been not working on low fat diet, she has been not doing regular exercise. Patient verbalised understanding about the significance for regular exercise and low fat diet in management of this problem     Lab Results   Lab Name Value Date/Time    CHOL 209 (H) 11/10/2016 06:53 AM    CHOL 188 05/09/2014 09:44 AM    LDLC 119 11/10/2016 06:53 AM    LDLC 107 05/09/2014 09:44 AM    HDL 60 11/10/2016 06:53 AM    HDL 59 05/09/2014 09:44 AM    TRIG 149 11/10/2016 06:53 AM    TRIG 108 05/09/2014 09:44 AM         She has been Stressed as traveling back and forth to [place]  to her daughter who is not doing   good finacially ,fatigue , increasing weight   She has been stress eating, gaining weight ,feels sad  Started walking yesterday      PHQ9:21    Little interest in doing things: 3  Feeling down, depressed or hopeless: 3  Trouble falling or staying asleep, or sleeping too much: 3  Feeling tired or having little energy: 3  Poor appetite or overeating: 3  Feeling bad about yourself or that you are a failure or have let yourself or your family down: 3  Trouble concentrating on things, such as reading or watching TV: 3  Moving or speaking so slowly that other people could have noticed. Or the opposite, being so fidgety or restless that you have been moving around more then usual: 0  Thoughts that you would be either better off dead or of hurting yourself: 0    Total Score Depression Severity   1-4 Minimal Depression   5-9 Mild Depression   10-14 Moderate Depression   15-19 Moderately Severe Depression   20-27 Severe Depression     Generalized Anxiety Disorder Scale GAD-7: 21    Feeling nervous, anxious or on edge: 3  Not being able to stop or control worrying: 3  Worrying too much about different things: 3  Trouble relaxing: 3  Being so restless that it is hard to sit still: 3  Becoming easily  annoyed or irritable: 3  Feeling afraid as if something awful might happen: 3    Ros  complete review of symptons was done all other systems are negative except as otherwise stated in HPI      Vs  BP 124/68  Pulse 70  Resp 16  Ht 1.626 m (5\' 4" )  Wt 86.2 kg (190 lb 0.6 oz)  SpO2 96%  BMI 32.62 kg/m2    Physical exam -  General Appearance: healthy, alert, no distress, pleasant affect, cooperative.  Eyes:  conjunctivae and corneas clear. PERRL, EOM's intact. sclerae normal.  Ears:  external inspection of ears show no abnormality.  Lungs: no chest deformities noted .lungs clear to auscultation.  Heart:  normal rate and regular rhythm, no murmurs,  Extremities:  no cyanosis, clubbing, or edema.    ASSESMENT AND PLAN     (R53.83) Fatigue, unspecified type  (primary encounter diagnosis)  Comment:   Plan: COMPREHENSIVE METABOLIC PANEL            (E03.9) Acquired hypothyroidism  Comment:   Plan: THYROID STIMULATING HORMONE          (E78.00) Pure hypercholesterolemia  Comment: mildly elevated cholesterol  Plan: LIPID PANEL            (F41.8) Anxiety and depression  Comment: severe   Plan: start tretament     I did review patient's past medical and family/social history, no changes noted.  Barriers to Learning assessed: none. Patient verbalizes understanding of teaching and instructions.  Zettie CooleyAPEET Tnya Ades MD      Electronically Signed By:  Zettie CooleyApeet Eino Whitner, MD  Physician associate   [facility]  [phone]

## 2016-11-17 NOTE — Nursing Note (Signed)
Patient has been identified by first,  last name, date of birth and address.    Since you [facility], have you been seen by any other physicians or surgeons, gone to the emergency room/urgent care or been hospitalized?  NO    Have you started any over-the-counter medications, herbal supplements, vitamins or home remedies?  Yes, new herbal supplements/OTC medications    Vital signs taken, pharmacy verified as well as pain.      Tammy [name] is here by  Herself for lab results, being tired, and weigh issues.    Aleah Ahlgrim Medical Assistant l

## 2016-11-17 NOTE — Patient Instructions (Addendum)
For your cholesterol:    Continue current medication(s)    Lifestyle Modifications to improve your Cholesterol Profile    Exercise: just 20-30 minutes of aerobic exercise on most days of the week can help lower you total and raise your good HDL cholesterol; for cardiovascular benefits we are looking at 40-45 minutes of moderate activity 4-5 days a week; and to lose weight as well, you need to aim for an hour a day.    If you smoke, break the tobacco habit. Quitting smoking can raise your HDL levels modestly, and being tobacco free for 2 years will negate or eliminate the risk of having smoked with regards to cardiovascular risk.    Lose weight: weight loss and gain is the end result of a very simple equation: calories in vs calories out. You will gain weight if you consume more calories then you burn, you will lose weight if you burn more calories then you consume. Simply put eat less and move more.    Choose the better fat. Minimize the saturated and trans fats in your diet. These substances increase the bad cholesterol while decreasing your good cholesterol. Instead, switch to products containing unsaturated fats (olive, canola, flaxseed, etc.). You still have to watch the calories though!    Cut back on simple carbohydrates. Cakes, cookies and highly processed cereals and breads are high-glycemic foods that can lower your HDL and raise the levels of another fat in your bloodstream, triglycerides.    Drink alcohol in moderation, with a caveat! Alcohol should not be considered medicine--if you don't drink, don't start--but some studies have found mild alcohol consumption (one drink per day for women, two for men) can raise your HDL.     Feast on cold-water fish. Eating salmon ( wild salmon), mackerel or other fish from icy waters several times a week can have a very positive effect on your HDL levels. They contain omega-3 fatty acids, which may help to explain their health benefits.    Add fiber. The soluble fiber  found in fruits, vegetables, nuts and grains.      Zoloft was prescribed.    We discussed the underlying pathophysiology of brain neurotransmitters including seratonin, norepinephrine and dopamine, and the effects stress can have on the brain chemistry. The supply and demand theory was explained in that as your supply of these neurotransmitters is set and predetermined genetically, the demand, on the other hand, is directly related to the amount of stress you are under. Due to increased demand under excessive stress the levels of "happy" neurotransmitters deplete and the signs and symptoms of depression begin to manifest. Medication can help raise those levels of neurotransmitters primaily by blocking their reuptake, so the production and supply remain the same, but what is produced stays around longer allowing levels to replenish. The benefits of counseling, and/or therapy, diet and exercise, and taking care of yourself on the most basic of levels were discussed: patient is encouraged to try and avoid stressful situations, to try and make steps towards resolving conflict, to move away from negativity. As the medication starts to work it will become easier to tackle some of these things.     Patient aware that it will take 1-2 weeks to start noticing an effect from the medication, and it can take up to 4-6 weeks to realize the full effect. Side effects are comparable to placebo except they all (aside from Wellbutrin) can have some sexual side effects manifested primarily in a decreased ability to achieve  orgasm, or a delay in orgasm or ejaculation. We see this in about 10-20% of patients on an SSRI, slightly less for SNRI and rarely with Wellbutrin. Weight gain is another common concern and this is rarely a significant factor in the newer medications we use to treat depression; weight is far more susceptible to our eating habits and how they fluctuate with our mood vs the medications we take that may alter our  mood.    Follow up in 4  weeks

## 2016-11-17 NOTE — Nursing Note (Signed)
Declined mammo and colonoscopy during screening.  D.Jashiya Bassett MA I

## 2016-11-23 DIAGNOSIS — J0140 Acute pansinusitis, unspecified: Secondary | ICD-10-CM | POA: Diagnosis not present

## 2016-12-15 ENCOUNTER — Encounter: Payer: Self-pay | Admitting: Internal Medicine

## 2016-12-15 ENCOUNTER — Ambulatory Visit: Payer: Medicare Other | Admitting: Internal Medicine

## 2016-12-15 VITALS — BP 130/80 | HR 68 | Resp 16 | Ht 66.0 in | Wt 187.4 lb

## 2016-12-15 DIAGNOSIS — F418 Other specified anxiety disorders: Secondary | ICD-10-CM

## 2016-12-15 DIAGNOSIS — E7800 Pure hypercholesterolemia, unspecified: Secondary | ICD-10-CM

## 2016-12-15 DIAGNOSIS — E039 Hypothyroidism, unspecified: Secondary | ICD-10-CM

## 2016-12-15 DIAGNOSIS — F32A Depression, unspecified: Secondary | ICD-10-CM

## 2016-12-15 DIAGNOSIS — F419 Anxiety disorder, unspecified: Principal | ICD-10-CM

## 2016-12-15 DIAGNOSIS — F329 Major depressive disorder, single episode, unspecified: Principal | ICD-10-CM

## 2016-12-15 MED ORDER — LEVOTHYROXINE 112 MCG TABLET
1.0000 | ORAL_TABLET | Freq: Every day | ORAL | 2 refills | Status: DC
Start: 2016-12-15 — End: 2018-01-18

## 2016-12-15 NOTE — Progress Notes (Signed)
Chief Complaint: Patient presents today with follow up on depression    Anxiety and Depression: Overall, she has moderate symptoms  She is experiencing depressed mood, feelings of hopelessness/worthlessess, insomnia, difficulty concentrating, no psychotic symptoms and no suicidal or homicidal thoughts, and she reports that none are improved. Psychosocial factors include: family/personal problems. She was prescribed zoloft last visit which she is taking but does not want to continue it , joined [employer] (her husband is alcoholic ) , started walking lost 3 lbs since last vist , she talked to her daughter and that helped her lot      PHQ9:18    Little interest in doing things: 2  Feeling down, depressed or hopeless: 2  Trouble falling or staying asleep, or sleeping too much: 3  Feeling tired or having little energy: 3  Poor appetite or overeating: 3  Feeling bad about yourself or that you are a failure or have let yourself or your family down: 2  Trouble concentrating on things, such as reading or watching TV: 3  Moving or speaking so slowly that other people could have noticed. Or the opposite, being so fidgety or restless that you have been moving around more then usual: 0  Thoughts that you would be either better off dead or of hurting yourself: 0    Total Score Depression Severity   1-4 Minimal Depression   5-9 Mild Depression   10-14 Moderate Depression   15-19 Moderately Severe Depression   20-27 Severe Depression     Generalized Anxiety Disorder Scale GAD-7: 13    Feeling nervous, anxious or on edge: 2  Not being able to stop or control worrying: 2  Worrying too much about different things: 2  Trouble relaxing: 2  Being so restless that it is hard to sit still: 3  Becoming easily annoyed or irritable: 2  Feeling afraid as if something awful might happen: 0      Depression  PHQ-9 results    PHQ-9 12/15/2016   Interest 2   Feeling 2   Sleeping 3   Tired 3   Appetite 3   Feeling Bad 2   Concentrating 3   Moving and  Speaking 0   Suicidal Thoughts 0   Problems Somewhat difficult   PHQ-9 Total 18   PHQ-9 Affective Total 9   PHQ-9 Physical Total 9   PHQ-9 Cognitive Total 3       Patient has a PHQ9 score of (!) 18 and Major Depression, Moderately Severe (15-19) depression.    No suicidal ideation.    .  Ros  complete review of symptons was done all other systems are negative except as otherwise stated in HPI    Vs  BP 130/80  Pulse 68  Resp 16  Ht 1.676 m (5\' 6" )  Wt 85 kg (187 lb 6.3 oz)  SpO2 96%  BMI 30.25 kg/m2      Physical exam -  General Appearance: healthy, alert, no distress, pleasant affect, cooperative.  Eyes:  conjunctivae and corneas clear. PERRL, EOM's intact. sclerae normal.  Ears:  external inspection of ears show no abnormality.  Lungs: no chest deformities noted .lungs clear to auscultation.  Heart:  normal rate and regular rhythm, no murmurs,  Extremities:  no cyanosis, clubbing, or edema.    ASSESMENT AND PLAN     (F41.8) Anxiety and depression  (primary encounter diagnosis)  Comment: moderate severe   She has no suicidal ideation at this visit   Declines to  take medication as feels Counsellor and lifestyle modifications are helping her    Plan: Restart Zoloft 25 mg  Daily if no improvement in depression     (E03.9) Acquired hypothyroidism  Comment:   Plan: THYROID STIMULATING HORMONE            (E78.00) Pure hypercholesterolemia  Comment:   Plan: on tretament     I did review patient's past medical and family/social history, no changes noted.  Barriers to Learning assessed: none. Patient verbalizes understanding of teaching and instructions.  Zettie Cooley MD      Electronically Signed By:  Zettie Cooley, MD  Physician associate   [facility] [place]  [phone]

## 2016-12-15 NOTE — Patient Instructions (Signed)
   Restart Zoloft 25 mg  Daily if no improvement in depression

## 2016-12-15 NOTE — Nursing Note (Signed)
Patient has been identified by first,  last name, date of birth and address.    [facility], have you been seen by any other physicians or surgeons, gone to the emergency room/urgent care or been hospitalized?  NO    Have you started any over-the-counter medications, herbal supplements, vitamins or home remedies?  NO  Vital signs taken, pharmacy verified as well as pain.      Tammy CutterDonna L Townsel is here by herself for medication follow up, tired a lot and weight gain. Stated needs refills for atorvastatin and thyroid medication to mail in pharmacy.      Alahni Varone Medical Assistant l

## 2017-01-06 DIAGNOSIS — Z131 Encounter for screening for diabetes mellitus: Secondary | ICD-10-CM | POA: Diagnosis not present

## 2017-01-06 DIAGNOSIS — M109 Gout, unspecified: Secondary | ICD-10-CM | POA: Diagnosis not present

## 2017-01-12 ENCOUNTER — Other Ambulatory Visit: Payer: Self-pay | Admitting: Internal Medicine

## 2017-01-13 DIAGNOSIS — L820 Inflamed seborrheic keratosis: Secondary | ICD-10-CM | POA: Diagnosis not present

## 2017-01-19 DIAGNOSIS — F419 Anxiety disorder, unspecified: Secondary | ICD-10-CM | POA: Diagnosis not present

## 2017-02-01 ENCOUNTER — Other Ambulatory Visit: Payer: Self-pay | Admitting: Internal Medicine

## 2017-02-01 DIAGNOSIS — K210 Gastro-esophageal reflux disease with esophagitis: Secondary | ICD-10-CM | POA: Diagnosis not present

## 2017-02-01 DIAGNOSIS — R1314 Dysphagia, pharyngoesophageal phase: Secondary | ICD-10-CM | POA: Diagnosis not present

## 2017-02-01 DIAGNOSIS — J45909 Unspecified asthma, uncomplicated: Secondary | ICD-10-CM | POA: Diagnosis not present

## 2017-02-01 DIAGNOSIS — F3342 Major depressive disorder, recurrent, in full remission: Secondary | ICD-10-CM | POA: Diagnosis not present

## 2017-02-04 ENCOUNTER — Ambulatory Visit
Admission: RE | Admit: 2017-02-04 | Discharge: 2017-02-04 | Disposition: A | Discharge status: Home / Self Care | Payer: Medicare Other | Source: Ambulatory Visit | Attending: Internal Medicine | Admitting: Internal Medicine

## 2017-02-04 DIAGNOSIS — R1314 Dysphagia, pharyngoesophageal phase: Secondary | ICD-10-CM

## 2017-02-04 DIAGNOSIS — R1310 Dysphagia, unspecified: Secondary | ICD-10-CM | POA: Diagnosis not present

## 2017-02-19 ENCOUNTER — Observation Stay (HOSPITAL_COMMUNITY)
Admission: EM | Admit: 2017-02-19 | Discharge: 2017-02-20 | Disposition: A | Discharge status: Home / Self Care | Payer: Medicare Other | Attending: Internal Medicine | Admitting: Internal Medicine

## 2017-02-19 ENCOUNTER — Encounter (HOSPITAL_COMMUNITY): Payer: Self-pay | Admitting: Emergency Medicine

## 2017-02-19 ENCOUNTER — Emergency Department (HOSPITAL_COMMUNITY): Payer: Medicare Other

## 2017-02-19 DIAGNOSIS — G9341 Metabolic encephalopathy: Secondary | ICD-10-CM | POA: Diagnosis not present

## 2017-02-19 DIAGNOSIS — R410 Disorientation, unspecified: Secondary | ICD-10-CM

## 2017-02-19 DIAGNOSIS — E782 Mixed hyperlipidemia: Secondary | ICD-10-CM | POA: Diagnosis present

## 2017-02-19 DIAGNOSIS — K219 Gastro-esophageal reflux disease without esophagitis: Secondary | ICD-10-CM | POA: Diagnosis present

## 2017-02-19 DIAGNOSIS — R55 Syncope and collapse: Secondary | ICD-10-CM | POA: Diagnosis not present

## 2017-02-19 DIAGNOSIS — Z7951 Long term (current) use of inhaled steroids: Secondary | ICD-10-CM | POA: Insufficient documentation

## 2017-02-19 DIAGNOSIS — Z634 Disappearance and death of family member: Secondary | ICD-10-CM | POA: Diagnosis present

## 2017-02-19 DIAGNOSIS — E039 Hypothyroidism, unspecified: Secondary | ICD-10-CM | POA: Diagnosis present

## 2017-02-19 DIAGNOSIS — E079 Disorder of thyroid, unspecified: Secondary | ICD-10-CM

## 2017-02-19 DIAGNOSIS — F0390 Unspecified dementia without behavioral disturbance: Secondary | ICD-10-CM | POA: Diagnosis present

## 2017-02-19 DIAGNOSIS — Z885 Allergy status to narcotic agent status: Secondary | ICD-10-CM | POA: Insufficient documentation

## 2017-02-19 DIAGNOSIS — I10 Essential (primary) hypertension: Secondary | ICD-10-CM | POA: Diagnosis not present

## 2017-02-19 DIAGNOSIS — E538 Deficiency of other specified B group vitamins: Secondary | ICD-10-CM | POA: Insufficient documentation

## 2017-02-19 DIAGNOSIS — R413 Other amnesia: Secondary | ICD-10-CM

## 2017-02-19 DIAGNOSIS — R4182 Altered mental status, unspecified: Secondary | ICD-10-CM | POA: Diagnosis present

## 2017-02-19 DIAGNOSIS — G454 Transient global amnesia: Secondary | ICD-10-CM

## 2017-02-19 DIAGNOSIS — Z888 Allergy status to other drugs, medicaments and biological substances status: Secondary | ICD-10-CM | POA: Diagnosis not present

## 2017-02-19 DIAGNOSIS — E785 Hyperlipidemia, unspecified: Secondary | ICD-10-CM | POA: Insufficient documentation

## 2017-02-19 DIAGNOSIS — F329 Major depressive disorder, single episode, unspecified: Secondary | ICD-10-CM | POA: Diagnosis not present

## 2017-02-19 DIAGNOSIS — Z79899 Other long term (current) drug therapy: Secondary | ICD-10-CM | POA: Insufficient documentation

## 2017-02-19 DIAGNOSIS — E7800 Pure hypercholesterolemia, unspecified: Secondary | ICD-10-CM | POA: Insufficient documentation

## 2017-02-19 DIAGNOSIS — F32A Depression, unspecified: Secondary | ICD-10-CM | POA: Diagnosis present

## 2017-02-19 LAB — COMPREHENSIVE METABOLIC PANEL
ALBUMIN: 4.6 g/dL (ref 3.5–5.0)
ALT: 21 U/L (ref 14–54)
AST: 28 U/L (ref 15–41)
Alkaline Phosphatase: 69 U/L (ref 38–126)
Anion gap: 9 (ref 5–15)
BILIRUBIN TOTAL: 0.4 mg/dL (ref 0.3–1.2)
BUN: 14 mg/dL (ref 6–20)
CO2: 28 mmol/L (ref 22–32)
CREATININE: 0.87 mg/dL (ref 0.44–1.00)
Calcium: 9.4 mg/dL (ref 8.9–10.3)
Chloride: 101 mmol/L (ref 101–111)
Glucose, Bld: 112 mg/dL — ABNORMAL HIGH (ref 65–99)
Potassium: 4.6 mmol/L (ref 3.5–5.1)
SODIUM: 138 mmol/L (ref 135–145)
TOTAL PROTEIN: 7.4 g/dL (ref 6.5–8.1)

## 2017-02-19 LAB — APTT: APTT: 33 s (ref 24–36)

## 2017-02-19 LAB — CBC
HCT: 37.3 % (ref 36.0–46.0)
Hemoglobin: 12.5 g/dL (ref 12.0–15.0)
MCH: 30.9 pg (ref 26.0–34.0)
MCHC: 33.5 g/dL (ref 30.0–36.0)
MCV: 92.1 fL (ref 78.0–100.0)
PLATELETS: 260 10*3/uL (ref 150–400)
RBC: 4.05 MIL/uL (ref 3.87–5.11)
RDW: 12.7 % (ref 11.5–15.5)
WBC: 7.3 10*3/uL (ref 4.0–10.5)

## 2017-02-19 LAB — DIFFERENTIAL
BASOS PCT: 0 %
Basophils Absolute: 0 10*3/uL (ref 0.0–0.1)
Eosinophils Absolute: 0.1 10*3/uL (ref 0.0–0.7)
Eosinophils Relative: 1 %
LYMPHS PCT: 19 %
Lymphs Abs: 1.4 10*3/uL (ref 0.7–4.0)
MONO ABS: 0.5 10*3/uL (ref 0.1–1.0)
MONOS PCT: 7 %
NEUTROS ABS: 5.2 10*3/uL (ref 1.7–7.7)
Neutrophils Relative %: 73 %

## 2017-02-19 LAB — URINALYSIS, ROUTINE W REFLEX MICROSCOPIC
Bilirubin Urine: NEGATIVE
GLUCOSE, UA: NEGATIVE mg/dL
Hgb urine dipstick: NEGATIVE
Ketones, ur: NEGATIVE mg/dL
Leukocytes, UA: NEGATIVE
Nitrite: NEGATIVE
PROTEIN: NEGATIVE mg/dL
Specific Gravity, Urine: 1.012 (ref 1.005–1.030)
pH: 6 (ref 5.0–8.0)

## 2017-02-19 LAB — CBG MONITORING, ED: Glucose-Capillary: 112 mg/dL — ABNORMAL HIGH (ref 65–99)

## 2017-02-19 LAB — GLUCOSE, CAPILLARY: GLUCOSE-CAPILLARY: 106 mg/dL — AB (ref 65–99)

## 2017-02-19 LAB — TROPONIN I: Troponin I: <0.03 ng/mL (ref <0.03)

## 2017-02-19 LAB — PROTIME-INR
INR: 0.96
PROTHROMBIN TIME: 12.8 s (ref 11.4–15.2)

## 2017-02-19 MED ORDER — LEVOTHYROXINE SODIUM 75 MCG PO TABS
75.0000 ug | ORAL_TABLET | Freq: Every day | ORAL | Status: DC
  Administered 2017-02-20: 75 ug via ORAL
  Filled 2017-02-19: qty 1

## 2017-02-19 MED ORDER — FAMOTIDINE 20 MG PO TABS: 20.0000 mg | ORAL_TABLET | Freq: Every evening | ORAL | Status: DC | PRN

## 2017-02-19 MED ORDER — FAMOTIDINE IN NACL 20-0.9 MG/50ML-% IV SOLN: 20.0000 mg | Freq: Two times a day (BID) | INTRAVENOUS | Status: DC | PRN

## 2017-02-19 MED ORDER — LORAZEPAM 2 MG/ML IJ SOLN: 0.5000 mg | Freq: Two times a day (BID) | INTRAMUSCULAR | Status: DC | PRN

## 2017-02-19 MED ORDER — LABETALOL HCL 5 MG/ML IV SOLN: 5.0000 mg | INTRAVENOUS | Status: DC | PRN

## 2017-02-19 MED ORDER — SODIUM CHLORIDE 0.9% FLUSH
3.0000 mL | Freq: Two times a day (BID) | INTRAVENOUS | Status: DC
  Administered 2017-02-19 – 2017-02-20 (×2): 3 mL via INTRAVENOUS

## 2017-02-19 MED ORDER — ESCITALOPRAM OXALATE 10 MG PO TABS
20.0000 mg | ORAL_TABLET | Freq: Every day | ORAL | Status: DC
  Administered 2017-02-20: 20 mg via ORAL
  Filled 2017-02-19: qty 2

## 2017-02-19 MED ORDER — ENOXAPARIN SODIUM 40 MG/0.4ML ~~LOC~~ SOLN
40.0000 mg | Freq: Every day | SUBCUTANEOUS | Status: DC
  Administered 2017-02-19: 40 mg via SUBCUTANEOUS

## 2017-02-19 MED ORDER — LORAZEPAM 0.5 MG PO TABS: 0.5000 mg | ORAL_TABLET | Freq: Two times a day (BID) | ORAL | Status: DC | PRN

## 2017-02-19 MED ORDER — METOPROLOL SUCCINATE ER 25 MG PO TB24
50.0000 mg | ORAL_TABLET | Freq: Every day | ORAL | Status: DC
  Administered 2017-02-19: 50 mg via ORAL
  Filled 2017-02-19: qty 1

## 2017-02-19 NOTE — ED Provider Notes (Signed)
[place] DEPT Provider Note   CSN: [id number] Arrival date & time: 02/19/17  0854     History   Chief Complaint Chief Complaint  Patient presents with  . Altered Mental Status    HPI Suzanne Galloway is a 76 y.o. female.  HPI Patient has lived with her daughter and her husband for 2 years. Patient daughter reports that she came home from work yesterday evening and the patient was her normal self. At baseline she is active and volunteers at [facility]. She has been well recently. Yesterday evening she was lying on the couch watching television and fell asleep which is not atypical. Her son-in-law was watching television as well. He noted when she did wake up to go to bed, she looked at him very strangely, walked randomly through the kitchen and then went to her bed. The patient's daughter reports this morning she started asking very unusual questions that were evidently confused. She asked her she had forwarded the address of her old home when she moved, which was 2 years ago. She did not recall what she had done with the money. She asked what the son-in-law was doing in the home that she has lived in with him for 2 years. She did not recall who the president was or the year which is very atypical as she stays up-to-date on current events. The patient is alert and interactive. She recognizes these deficits. She denies any focal weakness, numbness, tingling or extremity dysfunction. She does note a discomfort in her left shoulder and upper arm. No associated weakness, numbness, shortness of breath, diaphoresis or nausea. Past Medical History:  Diagnosis Date  . Arthritis    "hands, back, toes" (01/01/2015)  . Asthma   . Chronic bronchitis ([place])    "get it just about q yr" (01/01/2015)  . Depression   . GERD (gastroesophageal reflux disease)   . High cholesterol   . Hyperlipemia   . Hypothyroidism   . Mitral valve prolapse   . Urinary incontinence     Patient Active Problem  List   Diagnosis Date Noted  . Asthma exacerbation 01/01/2015  . Chest pressure 01/01/2015  . Cough 01/01/2015  . Low grade fever 01/01/2015  . Thyroid disease 01/01/2015  . GERD (gastroesophageal reflux disease) 01/01/2015  . Depression 01/01/2015  . HLD (hyperlipidemia) 01/01/2015  . Benign essential HTN 01/01/2015  . Exertional shortness of breath 12/28/2014  . Chest tightness 12/28/2014  . Mitral valve prolapse 12/28/2014  . Palpitations 12/28/2014  . Cough variant asthma vs UACS from gerd  06/18/2013    Past Surgical History:  Procedure Laterality Date  . BACK SURGERY    . CARPAL TUNNEL RELEASE Bilateral   . CYSTECTOMY Right 09/2014   "hand"   . DILATION AND CURETTAGE OF UTERUS    . FINGER SURGERY Bilateral    "scraped arthritis out of thumbs"  . INCONTINENCE SURGERY    . LAPAROSCOPIC CHOLECYSTECTOMY    . LUMBAR LAMINECTOMY  1987; 12/2009   ; Archie Endo 01/16/2010  . TOE SURGERY Left    "took bone out of 2nd toe; took cyst out"  . TUBAL LIGATION    . VAGINAL HYSTERECTOMY  1970    OB History    No data available       Home Medications    Prior to Admission medications   Medication Sig Start Date End Date Taking? Authorizing Provider  albuterol (PROVENTIL HFA;VENTOLIN HFA) 108 (90 BASE) MCG/ACT inhaler Inhale 2 puffs into the  lungs every 4 (four) hours as needed for wheezing. 01/05/13  Yes Michiel Cowboy, MD  budesonide-formoterol (SYMBICORT) 80-4.5 MCG/ACT inhaler Take 2 puffs first thing in am and then another 2 puffs about 12 hours later. Patient taking differently: Inhale 2 puffs into the lungs every 12 (twelve) hours.  12/06/15  Yes Tanda Rockers, MD  buPROPion (WELLBUTRIN XL) 150 MG 24 hr tablet Take 150 mg by mouth daily.   Yes Historical Provider, MD  Calcium Carbonate-Vitamin D (CALCIUM 600+D) 600-400 MG-UNIT tablet Take 1 tablet by mouth daily.   Yes Historical Provider, MD  cetirizine (ZYRTEC) 10 MG tablet Take 10 mg by mouth daily as needed for  allergies.    Yes Historical Provider, MD  cholecalciferol (VITAMIN D) 1000 UNITS tablet Take 1,000 Units by mouth daily.   Yes Historical Provider, MD  escitalopram (LEXAPRO) 20 MG tablet Take 20 mg by mouth daily.   Yes Historical Provider, MD  famotidine (PEPCID) 20 MG tablet One at bedtime Patient taking differently: Take 20 mg by mouth at bedtime as needed for heartburn or indigestion.  01/24/15  Yes Tanda Rockers, MD  levothyroxine (SYNTHROID, LEVOTHROID) 75 MCG tablet Take 75 mcg by mouth daily before breakfast.    Yes Historical Provider, MD  LORazepam (ATIVAN) 1 MG tablet Take 0.5-1 mg by mouth 2 (two) times daily as needed for anxiety.   Yes Historical Provider, MD  metoprolol succinate (TOPROL-XL) 50 MG 24 hr tablet Take 50 mg by mouth daily. Take with or immediately following a meal.   Yes Historical Provider, MD  vitamin B-12 (CYANOCOBALAMIN) 1000 MCG tablet Take 1,000 mcg by mouth daily.   Yes Historical Provider, MD    Family History Family History  Problem Relation Age of Onset  . COPD Father     smoked  . Heart disease Father   . Esophageal cancer Father     smoked  . Deep vein thrombosis Brother     Social History Social History  Substance Use Topics  . Smoking status: Never Smoker  . Smokeless tobacco: Never Used  . Alcohol use Yes     Comment: 01/01/2015 "mixed drink q 2 months or so"     Allergies   Dilaudid [hydromorphone]; Dulera [mometasone furo-formoterol fum]; Lactose intolerance (gi); Morphine and related; Percocet [oxycodone-acetaminophen]; Pravastatin; and Vesicare [solifenacin]   Review of Systems Review of Systems 10 Systems reviewed and are negative for acute change except as noted in the HPI.   Physical Exam Updated Vital Signs BP (!) 150/89   Pulse 69   Temp 98.2 F (36.8 C) (Oral)   Resp 16   Ht 5' 2.5" (1.588 m)   Wt 135 lb (61.2 kg)   SpO2 95%   BMI 24.30 kg/m   Physical Exam  Constitutional: She appears well-developed and  well-nourished. No distress.  HENT:  Head: Normocephalic and atraumatic.  Right Ear: External ear normal.  Left Ear: External ear normal.  Nose: Nose normal.  Mouth/Throat: Oropharynx is clear and moist.  Eyes: Conjunctivae and EOM are normal. Pupils are equal, round, and reactive to light.  Neck: Neck supple.  Cardiovascular: Normal rate, regular rhythm, normal heart sounds and intact distal pulses.   No murmur heard. Pulmonary/Chest: Effort normal and breath sounds normal. No respiratory distress.  Abdominal: Soft. She exhibits no distension. There is no tenderness. There is no guarding.  Musculoskeletal: Normal range of motion. She exhibits no edema, tenderness or deformity.  Neurological: She is alert. No cranial nerve deficit or  sensory deficit. She exhibits normal muscle tone. Coordination normal.  Skin: Skin is warm and dry.  Psychiatric: She has a normal mood and affect.  Nursing note and vitals reviewed.    ED Treatments / Results  Labs (all labs ordered are listed, but only abnormal results are displayed) Labs Reviewed  COMPREHENSIVE METABOLIC PANEL - Abnormal; Notable for the following:       Result Value   Glucose, Bld 112 (*)    All other components within normal limits  CBG MONITORING, ED - Abnormal; Notable for the following:    Glucose-Capillary 112 (*)    All other components within normal limits  CBC  PROTIME-INR  APTT  URINALYSIS, ROUTINE W REFLEX MICROSCOPIC  TROPONIN I  DIFFERENTIAL    EKG  EKG Interpretation  Date/Time:  Friday Feb 19 2017 09:07:56 EDT Ventricular Rate:  67 PR Interval:    QRS Duration: 86 QT Interval:  414 QTC Calculation: 437 R Axis:   46 Text Interpretation:  Sinus rhythm normal no change from old Confirmed by Johnney Killian, [place] [phone]) on 02/19/2017 10:17:29 AM       Radiology Mr Brain Wo Contrast (neuro Protocol)  Result Date: 02/19/2017 CLINICAL DATA:  Patient was acting and thinking normally yesterday but when she  woke up she was disoriented. Amnestic for event is. EXAM: MRI HEAD WITHOUT CONTRAST TECHNIQUE: Multiplanar, multiecho pulse sequences of the brain and surrounding structures were obtained without intravenous contrast. COMPARISON:  07/25/2015. FINDINGS: Brain: No evidence for acute infarction, hemorrhage, mass lesion, hydrocephalus, or extra-axial fluid. Moderate cerebral and cerebellar atrophy. Extensive T2 and FLAIR hyperintensities throughout the white matter, representing small vessel disease. Vascular: Flow voids are maintained throughout the carotid, basilar, and vertebral arteries. There are no areas of chronic hemorrhage. Skull and upper cervical spine: Unremarkable visualized calvarium, skullbase, and cervical vertebrae. Pituitary, pineal, cerebellar tonsils unremarkable. No upper cervical cord lesions. Sinuses/Orbits: No orbital masses or proptosis. Globes appear symmetric. Sinuses appear well aerated, without evidence for air-fluid level. Other: None. Compared with 2016, similar appearance. IMPRESSION: Atrophy and small vessel disease.  No acute intracranial findings. No significant progression from 2016. Electronically Signed   By: Staci Righter M.D.   On: 02/19/2017 14:22    Procedures Procedures (including critical care time)  Medications Ordered in ED Medications  metoprolol succinate (TOPROL-XL) 24 hr tablet 50 mg (not administered)     Initial Impression / Assessment and Plan / ED Course  I have reviewed the triage vital signs and the nursing notes.  Pertinent labs & imaging results that were available during my care of the patient were reviewed by me and considered in my medical decision making (see chart for details).    Consult: Neurology PA-C Tamala Julian. Recommends stat EEG and [facility] evaluation. Consult: [facility] hospitalist for admission  Final Clinical Impressions(s) / ED Diagnoses   Final diagnoses:  Memory loss Confusion   Patient  developed symptoms which likely began the night before presentation. 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 A number of these lost memories triggered repetitive questioning. Patient did not develop confusion in the sense of situationally inappropriate thought processes. Patient otherwise is alert and very appropriate. Her neurologic examination is normal with no loss of  coordination or other cognitive function. No signs of metabolic illness. No signs of infectious etiology. MRI does not show acute CVA. Differential diagnosis for possible transient global amnesia. Neurology suggests possibility of seizure pattern. Further diagnostic evaluation will be initiated in the emergency department with EEG as recommended by neurology and continued evaluation with admission. New Prescriptions New Prescriptions   No medications on file     Charlesetta Shanks, MD 02/21/17 [phone]

## 2017-02-19 NOTE — Progress Notes (Signed)
Admitted to [facility] via [facility].  Oriented to room, plan of care, safety precautions, NPO til ST evaluates in am.  Consulted MD re: concerns of "no fluids or food" since early am today.  Tele applied & verified.

## 2017-02-19 NOTE — ED Notes (Signed)
Patient states she has had trouble swallowing for the past month or so.

## 2017-02-19 NOTE — ED Notes (Signed)
Pt took metoprolol per EDP request. EDP aware patient had trouble swallowing x1 month. Benefits outweigh the risks.

## 2017-02-19 NOTE — ED Notes (Signed)
ED Provider at bedside. 

## 2017-02-19 NOTE — H&P (Signed)
History and Physical    Suzanne Galloway:[id number] DOB: [birth year] DOA: 02/19/2017   PCP: Lavone Orn, MD Patient coming from: Home  Chief Complaint: Memory loss  HPI: Suzanne Galloway is a 76 y.o. female with medical history significant of hypertension, GERD, hyperlipidemia, hypothyroidism, dj vu seizure disorder. History provided by agents daughters as patient is unable to provide history secondary to acute memory loss. This morning, patient was noted to be acting differently. She recognized her husband but asked why he was in the kitchen. She was also asking her daughter at questions and did not remember that her house have been sold not too long ago. Patient still remembered people but had trouble remembering details with regard to certain people. She was asked about the date, for which she was unable to answer. She was reoriented to the date and the current events, including the President of the [place]. She knows abdominal trauma but did not know that he was currently Software engineer. She follows up with Dr. Carles Collet, neurology, for evaluation of "Deja Vu Seizure disorder."  ED Course: Vitals: Afebrile. Normal pulse. Respirations of 14. Normotensive. 95% on room air Labs: Glucose of 112 otherwise unremarkable Imaging: MRI brain significant for atrophy and small vessel disease, stable from previous MRI in 2016. Medications/Course: Patient passed swallow screen. Metoprolol given.  Review of Systems: Review of Systems  Constitutional: Negative for chills and fever.  Respiratory: Positive for cough. Negative for hemoptysis, sputum production, shortness of breath and wheezing.   Cardiovascular: Negative for chest pain and palpitations.  Gastrointestinal: Positive for abdominal pain, constipation and diarrhea. Negative for nausea and vomiting.  Genitourinary: Positive for frequency and urgency. Negative for dysuria and hematuria.  Neurological: Negative for dizziness, sensory change, speech change,  focal weakness, loss of consciousness and headaches.       Memory loss  Psychiatric/Behavioral: Positive for depression. Negative for hallucinations.  All other systems reviewed and are negative.   Past Medical History:  Diagnosis Date  . Arthritis    "hands, back, toes" (01/01/2015)  . Asthma   . Chronic bronchitis ([place])    "get it just about q yr" (01/01/2015)  . Depression   . GERD (gastroesophageal reflux disease)   . High cholesterol   . Hyperlipemia   . Hypothyroidism   . Mitral valve prolapse   . Urinary incontinence     Past Surgical History:  Procedure Laterality Date  . BACK SURGERY    . CARPAL TUNNEL RELEASE Bilateral   . CYSTECTOMY Right 09/2014   "hand"   . DILATION AND CURETTAGE OF UTERUS    . FINGER SURGERY Bilateral    "scraped arthritis out of thumbs"  . INCONTINENCE SURGERY    . LAPAROSCOPIC CHOLECYSTECTOMY    . LUMBAR LAMINECTOMY  1987; 12/2009   ; Archie Endo 01/16/2010  . TOE SURGERY Left    "took bone out of 2nd toe; took cyst out"  . TUBAL LIGATION    . VAGINAL HYSTERECTOMY  1970     reports that she has never smoked. She has never used smokeless tobacco. She reports that she drinks alcohol. She reports that she does not use drugs.  Allergies  Allergen Reactions  . Dilaudid [Hydromorphone] Other (See Comments)    Reaction:  Hallucinations   . Dulera [Mometasone Furo-Formoterol Fum] Other (See Comments)    Reaction:  Deja vu seizures   . Lactose Intolerance (Gi) Diarrhea  . Morphine And Related Nausea And Vomiting  . Percocet [Oxycodone-Acetaminophen] Other (See Comments)  Reaction:  Hallucinations   . Pravastatin Other (See Comments)    Reaction:  Muscle pain   . Vesicare [Solifenacin] Other (See Comments)    Reaction:  Constipation     Family History  Problem Relation Age of Onset  . COPD Father     smoked  . Heart disease Father   . Esophageal cancer Father     smoked  . Deep vein thrombosis Brother     Prior to Admission  medications   Medication Sig Start Date End Date Taking? Authorizing Provider  albuterol (PROVENTIL HFA;VENTOLIN HFA) 108 (90 BASE) MCG/ACT inhaler Inhale 2 puffs into the lungs every 4 (four) hours as needed for wheezing. 01/05/13  Yes Bringolf, Jonathon, MD  budesonide-formoterol (SYMBICORT) 80-4.5 MCG/ACT inhaler Take 2 puffs first thing in am and then another 2 puffs about 12 hours later. Patient taking differently: Inhale 2 puffs into the lungs every 12 (twelve) hours.  12/06/15  Yes Tanda Rockers, MD  buPROPion (WELLBUTRIN XL) 150 MG 24 hr tablet Take 150 mg by mouth daily.   Yes [provider]  Calcium Carbonate-Vitamin D (CALCIUM 600+D) 600-400 MG-UNIT tablet Take 1 tablet by mouth daily.   Yes [provider]  cetirizine (ZYRTEC) 10 MG tablet Take 10 mg by mouth daily as needed for allergies.    Yes [provider]  cholecalciferol (VITAMIN D) 1000 UNITS tablet Take 1,000 Units by mouth daily.   Yes [provider]  escitalopram (LEXAPRO) 20 MG tablet Take 20 mg by mouth daily.   Yes [provider]  famotidine (PEPCID) 20 MG tablet One at bedtime Patient taking differently: Take 20 mg by mouth at bedtime as needed for heartburn or indigestion.  01/24/15  Yes Tanda Rockers, MD  levothyroxine (SYNTHROID, LEVOTHROID) 75 MCG tablet Take 75 mcg by mouth daily before breakfast.    Yes [provider]  LORazepam (ATIVAN) 1 MG tablet Take 0.5-1 mg by mouth 2 (two) times daily as needed for anxiety.   Yes [provider]  metoprolol succinate (TOPROL-XL) 50 MG 24 hr tablet Take 50 mg by mouth daily. Take with or immediately following a meal.   Yes [provider]  vitamin B-12 (CYANOCOBALAMIN) 1000 MCG tablet Take 1,000 mcg by mouth daily.   Yes [provider]    Physical Exam: Vitals:   02/19/17 1315 02/19/17 1417 02/19/17 1510 02/19/17 1530  BP:  (!) 143/62 132/64 (!) 150/89  Pulse:  71 70 69  Resp: 12 14 16  16   Temp:      TempSrc:      SpO2:  93% 94% 95%  Weight:      Height:         Constitutional: NAD, calm, comfortable Eyes: PERRL, lids and conjunctivae normal ENMT: Mucous membranes are moist. Posterior pharynx clear of any exudate or lesions. Top dentures and bottom partial dentures.  Neck: normal, supple, no masses, no thyromegaly Respiratory: clear to auscultation bilaterally, no wheezing, no crackles. Normal respiratory effort. No accessory muscle use.  Cardiovascular: Regular rate and rhythm, no murmurs / rubs / gallops. No extremity edema. 2+ pedal pulses. Left carotid bruit  Abdomen: no tenderness, no masses palpated. No hepatosplenomegaly. Bowel sounds positive.  Musculoskeletal: no clubbing / cyanosis. No joint deformity upper and lower extremities. Good ROM, no contractures. Normal muscle tone.  Skin: no rashes, lesions, ulcers. No induration Neurologic: CN 2-12 grossly intact. Sensation intact, DTR normal. Strength 5/5 in all 4. Memory impaired Psychiatric: Normal judgment  and insight. Alert and oriented x 3 after earlier reorientation. Normal mood.    Labs on Admission: I have personally reviewed following labs and imaging studies  CBC:  Recent Labs Lab 02/19/17 0935  WBC 7.3  NEUTROABS 5.2  HGB 12.5  HCT 37.3  MCV 92.1  PLT 250   Basic Metabolic Panel:  Recent Labs Lab 02/19/17 0935  NA 138  K 4.6  CL 101  CO2 28  GLUCOSE 112*  BUN 14  CREATININE 0.87  CALCIUM 9.4   GFR: Estimated Creatinine Clearance: 45.2 mL/min (by C-G formula based on SCr of 0.87 mg/dL). Liver Function Tests:  Recent Labs Lab 02/19/17 0935  AST 28  ALT 21  ALKPHOS 69  BILITOT 0.4  PROT 7.4  ALBUMIN 4.6   No results for input(s): LIPASE, AMYLASE in the last 168 hours. No results for input(s): AMMONIA in the last 168 hours. Coagulation Profile:  Recent Labs Lab 02/19/17 0935  INR 0.96   Cardiac Enzymes:  Recent Labs Lab 02/19/17 0935  TROPONINI <0.03   BNP  (last 3 results) No results for input(s): PROBNP in the last 8760 hours. HbA1C: No results for input(s): HGBA1C in the last 72 hours. CBG:  Recent Labs Lab 02/19/17 0939  GLUCAP 112*   Lipid Profile: No results for input(s): CHOL, HDL, LDLCALC, TRIG, CHOLHDL, LDLDIRECT in the last 72 hours. Thyroid Function Tests: No results for input(s): TSH, T4TOTAL, FREET4, T3FREE, THYROIDAB in the last 72 hours. Anemia Panel: No results for input(s): VITAMINB12, FOLATE, FERRITIN, TIBC, IRON, RETICCTPCT in the last 72 hours. Urine analysis:    Component Value Date/Time   COLORURINE YELLOW 02/19/2017 1031   APPEARANCEUR CLEAR 02/19/2017 1031   LABSPEC 1.012 02/19/2017 1031   PHURINE 6.0 02/19/2017 1031   GLUCOSEU NEGATIVE 02/19/2017 1031   HGBUR NEGATIVE 02/19/2017 1031   BILIRUBINUR NEGATIVE 02/19/2017 1031   KETONESUR NEGATIVE 02/19/2017 1031   PROTEINUR NEGATIVE 02/19/2017 1031   UROBILINOGEN 1.0 01/05/2013 1929   NITRITE NEGATIVE 02/19/2017 1031   LEUKOCYTESUR NEGATIVE 02/19/2017 1031   Sepsis Labs: !!!!!!!!!!!!!!!!!!!!!!!!!!!!!!!!!!!!!!!!!!!! @LABRCNTIP (procalcitonin:4,lacticidven:4) )No results found for this or any previous visit (from the past 240 hour(s)).   Radiological Exams on Admission: Mr Brain Wo Contrast (neuro Protocol)  Result Date: 02/19/2017 CLINICAL DATA:  Patient was acting and thinking normally yesterday but when she woke up she was disoriented. Amnestic for event is. EXAM: MRI HEAD WITHOUT CONTRAST TECHNIQUE: Multiplanar, multiecho pulse sequences of the brain and surrounding structures were obtained without intravenous contrast. COMPARISON:  07/25/2015. FINDINGS: Brain: No evidence for acute infarction, hemorrhage, mass lesion, hydrocephalus, or extra-axial fluid. Moderate cerebral and cerebellar atrophy. Extensive T2 and FLAIR hyperintensities throughout the white matter, representing small vessel disease. Vascular: Flow voids are maintained throughout the carotid,  basilar, and vertebral arteries. There are no areas of chronic hemorrhage. Skull and upper cervical spine: Unremarkable visualized calvarium, skullbase, and cervical vertebrae. Pituitary, pineal, cerebellar tonsils unremarkable. No upper cervical cord lesions. Sinuses/Orbits: No orbital masses or proptosis. Globes appear symmetric. Sinuses appear well aerated, without evidence for air-fluid level. Other: None. Compared with 2016, similar appearance. IMPRESSION: Atrophy and small vessel disease.  No acute intracranial findings. No significant progression from 2016. Electronically Signed   By: Staci Righter M.D.   On: 02/19/2017 14:22    EKG: I have independently reviewed this EKG with my findings: Normal sinus rhythm  Assessment/Plan Active Problems:   Thyroid disease   GERD (gastroesophageal reflux disease)   HLD (hyperlipidemia)   Benign essential HTN  Altered mental status  Altered mental status Unknown etiology. MRI unremarkable. EEG pending. [facility]rology to evaluate at [facility]. Patient has a history of dj vu seizure disorder and follows up with neurology as an outpatient. She has had a full workup for dementia. -Neurology recommendations -Follow-up EEG results -Neuro checks  Hypothyroidism Patient sees Dr. Laurann Montana as an outpatient and had her Synthroid recently adjusted from alternating 75 g and 88 g down to just 75 g daily -Continue Synthroid 75 g  Hyperlipidemia Last LDL of 105 and 2016. Currently diet-controlled. Not on statin secondary to muscle cramping.  GERD Stable -Continue Pepcid when necessary  Depression Stable -Continue Cipro and Wellbutrin XL  DVT prophylaxis: Lovenox Code Status: Full code Family Communication: 2 daughters at bedside Disposition Plan: Discharge pending neurology workup Consults called: Discussed with Derek [place] of neurology by EDP (attending Dr. Shon Hale) Admission status: Observation, telemetry   Cordelia Poche, MD [facility] Pager [phone]  If 7PM-7AM, please contact night-coverage [email or web address] Password [email or web address]  02/19/2017, 4:08 PM

## 2017-02-19 NOTE — ED Triage Notes (Signed)
Family reports pt was acting and thinking normally yesterday. When she woke up this am, she was disoriented. Doing better now, but cannot not remember some recent events. No facial droop or unilateral weakness. Took zyrtec last night.

## 2017-02-19 NOTE — ED Notes (Signed)
Patient transported to MRI 

## 2017-02-20 ENCOUNTER — Observation Stay (HOSPITAL_COMMUNITY)
Admit: 2017-02-20 | Discharge: 2017-02-20 | Disposition: A | Discharge status: Home / Self Care | Payer: Medicare Other | Attending: Emergency Medicine | Admitting: Emergency Medicine

## 2017-02-20 DIAGNOSIS — K219 Gastro-esophageal reflux disease without esophagitis: Secondary | ICD-10-CM | POA: Diagnosis not present

## 2017-02-20 DIAGNOSIS — Z885 Allergy status to narcotic agent status: Secondary | ICD-10-CM | POA: Diagnosis not present

## 2017-02-20 DIAGNOSIS — E538 Deficiency of other specified B group vitamins: Secondary | ICD-10-CM | POA: Diagnosis not present

## 2017-02-20 DIAGNOSIS — F322 Major depressive disorder, single episode, severe without psychotic features: Secondary | ICD-10-CM

## 2017-02-20 DIAGNOSIS — E785 Hyperlipidemia, unspecified: Secondary | ICD-10-CM | POA: Diagnosis not present

## 2017-02-20 DIAGNOSIS — Z888 Allergy status to other drugs, medicaments and biological substances status: Secondary | ICD-10-CM | POA: Diagnosis not present

## 2017-02-20 DIAGNOSIS — E039 Hypothyroidism, unspecified: Secondary | ICD-10-CM | POA: Diagnosis not present

## 2017-02-20 DIAGNOSIS — R4182 Altered mental status, unspecified: Secondary | ICD-10-CM | POA: Diagnosis not present

## 2017-02-20 DIAGNOSIS — E079 Disorder of thyroid, unspecified: Secondary | ICD-10-CM | POA: Diagnosis not present

## 2017-02-20 DIAGNOSIS — Z7951 Long term (current) use of inhaled steroids: Secondary | ICD-10-CM | POA: Diagnosis not present

## 2017-02-20 DIAGNOSIS — I10 Essential (primary) hypertension: Secondary | ICD-10-CM | POA: Diagnosis not present

## 2017-02-20 DIAGNOSIS — F0390 Unspecified dementia without behavioral disturbance: Secondary | ICD-10-CM

## 2017-02-20 DIAGNOSIS — G9341 Metabolic encephalopathy: Secondary | ICD-10-CM | POA: Diagnosis not present

## 2017-02-20 DIAGNOSIS — E7800 Pure hypercholesterolemia, unspecified: Secondary | ICD-10-CM | POA: Diagnosis not present

## 2017-02-20 DIAGNOSIS — Z79899 Other long term (current) drug therapy: Secondary | ICD-10-CM | POA: Diagnosis not present

## 2017-02-20 DIAGNOSIS — F329 Major depressive disorder, single episode, unspecified: Secondary | ICD-10-CM | POA: Diagnosis not present

## 2017-02-20 LAB — VITAMIN B12: Vitamin B-12: 312 pg/mL (ref 180–914)

## 2017-02-20 LAB — AMMONIA: Ammonia: 32 umol/L (ref 9–35)

## 2017-02-20 LAB — GLUCOSE, CAPILLARY
Glucose-Capillary: 101 mg/dL — ABNORMAL HIGH (ref 65–99)
Glucose-Capillary: 106 mg/dL — ABNORMAL HIGH (ref 65–99)

## 2017-02-20 LAB — TSH: TSH: 10.174 u[IU]/mL — ABNORMAL HIGH (ref 0.350–4.500)

## 2017-02-20 LAB — SEDIMENTATION RATE: Sed Rate: 15 mm/hr (ref 0–22)

## 2017-02-20 MED ORDER — VITAMIN B-12 1000 MCG PO TABS: 1000.0000 ug | ORAL_TABLET | Freq: Every day | ORAL | Status: DC

## 2017-02-20 MED ORDER — BUPROPION HCL ER (XL) 150 MG PO TB24
150.0000 mg | ORAL_TABLET | Freq: Every day | ORAL | Status: DC
  Administered 2017-02-20: 150 mg via ORAL
  Filled 2017-02-20: qty 1

## 2017-02-20 MED ORDER — ALBUTEROL SULFATE (2.5 MG/3ML) 0.083% IN NEBU: 3.0000 mL | INHALATION_SOLUTION | RESPIRATORY_TRACT | Status: DC | PRN

## 2017-02-20 MED ORDER — ACETAMINOPHEN 325 MG PO TABS: 650.0000 mg | ORAL_TABLET | Freq: Four times a day (QID) | ORAL | Status: DC | PRN

## 2017-02-20 MED ORDER — LEVOTHYROXINE SODIUM 100 MCG PO TABS: 100.0000 ug | ORAL_TABLET | Freq: Every day | ORAL | Status: DC

## 2017-02-20 MED ORDER — LEVOTHYROXINE SODIUM 100 MCG PO TABS: 100.0000 ug | ORAL_TABLET | Freq: Every day | ORAL | 0 refills | Status: DC

## 2017-02-20 MED ORDER — ACETAMINOPHEN 650 MG RE SUPP: 650.0000 mg | Freq: Four times a day (QID) | RECTAL | Status: DC | PRN

## 2017-02-20 MED ORDER — CALCIUM CARBONATE-VITAMIN D 500-200 MG-UNIT PO TABS
1.0000 | ORAL_TABLET | Freq: Every day | ORAL | Status: DC
  Administered 2017-02-20: 1 via ORAL
  Filled 2017-02-20: qty 1

## 2017-02-20 MED ORDER — VITAMIN D 1000 UNITS PO TABS
1000.0000 [IU] | ORAL_TABLET | Freq: Every day | ORAL | Status: DC
  Administered 2017-02-20: 1000 [IU] via ORAL
  Filled 2017-02-20: qty 1

## 2017-02-20 NOTE — Progress Notes (Signed)
After speaking with neurologist, MRI negative, therefore resume diet order & D/C ST eval this am. Resumed neuro checks / VS.

## 2017-02-20 NOTE — Consult Note (Signed)
NEURO HOSPITALIST CONSULT NOTE   Requestig physician: Dr. Clementeen Graham  Reason for Consult: Retrograde memory loss  History obtained from:  Patient, Daughter and Chart     HPI:                                                                                                                                          Suzanne Galloway is an 76 y.o. female who presented to the [facility] ED with a c/c of memory loss. Family had reported her as thinking and behaving normally on Thursday. On awakening Friday morning, she was disoriented. She was unable to remember events that had occurred over the past 2-3 years. She has been under significant psychosocial stress over the past several months, with illness in the family and recent sale of her home, as well as moving in to the home of a daughter. Examples of changes from yesterday included a strange expression on her face, inability to recall that she had moved, asking to change the forwarding address from her old home despite having moved 2.5 years ago, stating it was 2016 and not knowing who the president was. Also with no memory of a recent beach trip with a friend and was unable to recall several occurrences from the past 2 years.   She denies vision loss, focal weakness, facial droop, numbness, fever, chills, CP or SOB.   She sees her Neurologist Dr. Carles Collet as an outpatient for "Deja vu seizures". The patient and her daughter state that they were told by their Neurologist that the "Deja vu seizures" are stress related and she is not on an anticonvulsant for this.    MRI obtained yesterday showed no acute changes. Atrophy and small vessel disease were noted.   Past Medical History:  Diagnosis Date  . Arthritis    "hands, back, toes" (01/01/2015)  . Asthma   . Chronic bronchitis ([place])    "get it just about q yr" (01/01/2015)  . Depression   . GERD (gastroesophageal reflux disease)   . High cholesterol   . Hyperlipemia   . Hypothyroidism    . Mitral valve prolapse   . Urinary incontinence     Past Surgical History:  Procedure Laterality Date  . BACK SURGERY    . CARPAL TUNNEL RELEASE Bilateral   . CYSTECTOMY Right 09/2014   "hand"   . DILATION AND CURETTAGE OF UTERUS    . FINGER SURGERY Bilateral    "scraped arthritis out of thumbs"  . INCONTINENCE SURGERY    . LAPAROSCOPIC CHOLECYSTECTOMY    . LUMBAR LAMINECTOMY  1987; 12/2009   ; Archie Endo 01/16/2010  . TOE SURGERY Left    "took bone out of 2nd toe; took cyst out"  . TUBAL LIGATION    . VAGINAL  HYSTERECTOMY  1970    Family History  Problem Relation Age of Onset  . COPD Father     smoked  . Heart disease Father   . Esophageal cancer Father     smoked  . Transient ischemic attack Father   . Deep vein thrombosis Brother     Social History:  reports that she has never smoked. She has never used smokeless tobacco. She reports that she drinks alcohol. She reports that she does not use drugs.  Allergies  Allergen Reactions  . Dilaudid [Hydromorphone] Other (See Comments)    Reaction:  Hallucinations   . Dulera [Mometasone Furo-Formoterol Fum] Other (See Comments)    Reaction:  Deja vu seizures   . Lactose Intolerance (Gi) Diarrhea  . Morphine And Related Nausea And Vomiting  . Percocet [Oxycodone-Acetaminophen] Other (See Comments)    Reaction:  Hallucinations   . Pravastatin Other (See Comments)    Reaction:  Muscle pain   . Vesicare [Solifenacin] Other (See Comments)    Reaction:  Constipation     MEDICATIONS:                                                                                                                     Prior to Admission:  Prescriptions Prior to Admission  Medication Sig Dispense Refill Last Dose  . albuterol (PROVENTIL HFA;VENTOLIN HFA) 108 (90 BASE) MCG/ACT inhaler Inhale 2 puffs into the lungs every 4 (four) hours as needed for wheezing. 6.7 g 3 Past Week at Unknown time  . budesonide-formoterol (SYMBICORT) 80-4.5 MCG/ACT  inhaler Take 2 puffs first thing in am and then another 2 puffs about 12 hours later. (Patient taking differently: Inhale 2 puffs into the lungs every 12 (twelve) hours. ) 1 Inhaler 11 02/18/2017 at Unknown time  . buPROPion (WELLBUTRIN XL) 150 MG 24 hr tablet Take 150 mg by mouth daily.   02/18/2017 at Unknown time  . Calcium Carbonate-Vitamin D (CALCIUM 600+D) 600-400 MG-UNIT tablet Take 1 tablet by mouth daily.   02/18/2017 at Unknown time  . cetirizine (ZYRTEC) 10 MG tablet Take 10 mg by mouth daily as needed for allergies.    02/18/2017 at Unknown time  . cholecalciferol (VITAMIN D) 1000 UNITS tablet Take 1,000 Units by mouth daily.   02/18/2017 at Unknown time  . escitalopram (LEXAPRO) 20 MG tablet Take 20 mg by mouth daily.   02/18/2017 at Unknown time  . famotidine (PEPCID) 20 MG tablet One at bedtime (Patient taking differently: Take 20 mg by mouth at bedtime as needed for heartburn or indigestion. )   Past Week at Unknown time  . levothyroxine (SYNTHROID, LEVOTHROID) 75 MCG tablet Take 75 mcg by mouth daily before breakfast.    02/18/2017 at Unknown time  . LORazepam (ATIVAN) 1 MG tablet Take 0.5-1 mg by mouth 2 (two) times daily as needed for anxiety.   02/18/2017 at Unknown time  . metoprolol succinate (TOPROL-XL) 50 MG 24 hr tablet Take 50 mg by mouth daily. Take with or  immediately following a meal.   02/18/2017 at 0800  . vitamin B-12 (CYANOCOBALAMIN) 1000 MCG tablet Take 1,000 mcg by mouth daily.   02/18/2017 at Unknown time   Scheduled: . enoxaparin (LOVENOX) injection  40 mg Subcutaneous QHS  . escitalopram  20 mg Oral Daily  . levothyroxine  75 mcg Oral QAC breakfast  . sodium chloride flush  3 mL Intravenous Q12H    ROS:                                                                                                                                       As per HPI.    Blood pressure (!) 145/69, pulse 68, temperature 98.6 F (37 C), temperature source Oral, resp. rate 18, height 5' 2.5" (1.588  m), weight 64.9 kg (143 lb 1.6 oz), SpO2 100 %.  General Examination:                                                                                                      HEENT-  [place]/AT   Lungs- Respirations unlabored Extremities- No edema  Neurological Examination Mental Status: Awake and alert. Oriented to day, year, city and state, but not month. Speech fluent. Comprehension intact for 3 step command but exhibits right left confusion and mild finger agnosia. Naming otherwise intact. Repetition intact. Recalls 2/3 words after a 5 minute delay.  Cranial Nerves: II: Visual fields intact. PERRL. III,IV, VI: ptosis not present, EOMI without nystagmus V,VII: smile symmetric, facial temp sensation normal bilaterally VIII: HOH IX,X: no hypophonia XI: symmetric XII: midline tongue extension Motor: Right : Upper extremity   5/5    Left:     Upper extremity   5/5  Lower extremity   5/5     Lower extremity   5/5 Normal tone throughout; no atrophy noted Sensory:Temp and light touch intact x 4. No extinction.  Deep Tendon Reflexes: 2+ and symmetric throughout Plantars: Right: downgoing   Left: downgoing Cerebellar: No ataxia with FNF bilaterally  Gait: Deferred    Lab Results: Basic Metabolic Panel:  Recent Labs Lab 02/19/17 0935  NA 138  K 4.6  CL 101  CO2 28  GLUCOSE 112*  BUN 14  CREATININE 0.87  CALCIUM 9.4    Liver Function Tests:  Recent Labs Lab 02/19/17 0935  AST 28  ALT 21  ALKPHOS 69  BILITOT 0.4  PROT 7.4  ALBUMIN 4.6   No results for input(s): LIPASE, AMYLASE in the last 168 hours. No  results for input(s): AMMONIA in the last 168 hours.  CBC:  Recent Labs Lab 02/19/17 0935  WBC 7.3  NEUTROABS 5.2  HGB 12.5  HCT 37.3  MCV 92.1  PLT 260    Cardiac Enzymes:  Recent Labs Lab 02/19/17 0935  TROPONINI <0.03    Lipid Panel: No results for input(s): CHOL, TRIG, HDL, CHOLHDL, VLDL, LDLCALC in the last 168 hours.  CBG:  Recent Labs Lab  02/19/17 0939 02/19/17 2316  GLUCAP 112* 106*    Microbiology: Results for orders placed or performed during the hospital encounter of 01/01/15  Rapid strep screen     Status: None   Collection Time: 01/03/15  4:57 PM  Result Value Ref Range Status   Streptococcus, Group A Screen (Direct) NEGATIVE NEGATIVE Final    Comment: (NOTE) A Rapid Antigen test may result negative if the antigen level in the sample is below the detection level of this test. The FDA has not cleared this test as a stand-alone test therefore the rapid antigen negative result has reflexed to a Group A Strep culture.   Culture, Group A Strep     Status: None   Collection Time: 01/03/15  4:57 PM  Result Value Ref Range Status   Strep A Culture Negative  Final    Comment: (NOTE) Performed At: [facility] [place], [place] [id number] Lindon Romp MD WU:[id number]     Coagulation Studies:  Recent Labs  02/19/17 0935  LABPROT 12.8  INR 0.96    Imaging: Mr Brain Wo Contrast (neuro Protocol)  Result Date: 02/19/2017 CLINICAL DATA:  Patient was acting and thinking normally yesterday but when she woke up she was disoriented. Amnestic for event is. EXAM: MRI HEAD WITHOUT CONTRAST TECHNIQUE: Multiplanar, multiecho pulse sequences of the brain and surrounding structures were obtained without intravenous contrast. COMPARISON:  07/25/2015. FINDINGS: Brain: No evidence for acute infarction, hemorrhage, mass lesion, hydrocephalus, or extra-axial fluid. Moderate cerebral and cerebellar atrophy. Extensive T2 and FLAIR hyperintensities throughout the white matter, representing small vessel disease. Vascular: Flow voids are maintained throughout the carotid, basilar, and vertebral arteries. There are no areas of chronic hemorrhage. Skull and upper cervical spine: Unremarkable visualized calvarium, skullbase, and cervical vertebrae. Pituitary, pineal, cerebellar tonsils unremarkable. No upper cervical  cord lesions. Sinuses/Orbits: No orbital masses or proptosis. Globes appear symmetric. Sinuses appear well aerated, without evidence for air-fluid level. Other: None. Compared with 2016, similar appearance. IMPRESSION: Atrophy and small vessel disease.  No acute intracranial findings. No significant progression from 2016. Electronically Signed   By: Staci Righter M.D.   On: 02/19/2017 14:22    Assessment: 1. Retrograde memory loss. This type of memory loss is generally not due to a CNS lesion and most likely is psychiatric in nature. Significant recent psychosocial stressors. MRI shows no acute findings; atrophy and small vessel disease are noted.  2. Depression. 3. Hypothyroidism  Recommendations: 1. EEG.  2. TSH level, B12 level, thiamine level, ammonia, ESR 3. Psychology consult regarding psychosocial stressors. The patient has good insight and seems willing to discuss this further.   Electronically signed: Dr. Kerney Elbe 02/20/2017, 6:45 AM

## 2017-02-20 NOTE — Discharge Instructions (Addendum)
Dementia Caregiver Guide Dementia is a term used to describe a number of symptoms that affect memory and thinking. The most common symptoms include:  Memory loss.  Trouble with language and communication.  Trouble concentrating.  Poor judgment.  Problems with reasoning.  Child-like behavior and language.  Extreme anxiety.  Angry outbursts.  Wandering from home or public places. Dementia usually gets worse slowly over time. In the early stages, people with dementia can stay independent and safe with some help. In later stages, they need help with daily tasks such as dressing, grooming, and using the bathroom. How to help the person with dementia cope Dementia can be frightening and confusing. Here are some tips to help the person with dementia cope with changes caused by the disease. General tips   Keep the person on track with his or her routine.  Try to identify areas where the person may need help.  Be supportive, patient, calm, and encouraging.  Gently remind the person that adjusting to changes takes time.  Help with the tasks that the person has asked for help with.  Keep the person involved in daily tasks and decisions as much as possible.  Encourage conversation, but try not to get frustrated or harried if the person struggles to find words or does not seem to appreciate your help. Communication tips   When the person is talking or seems frustrated, make eye contact and hold the person's hand.  Ask specific questions that need yes or no answers.  Use simple words, short sentences, and a calm voice. Only give one direction at a time.  When offering choices, limit them to just 1 or 2.  Avoid correcting the person in a negative way.  If the person is struggling to find the right words, gently try to help him or her. How to recognize symptoms of stress Symptoms of stress in caregivers include:  Feeling frustrated or angry with the person with  dementia.  Denying that the person has dementia or that his or her symptoms will not improve.  Feeling hopeless and unappreciated.  Difficulty sleeping.  Difficulty concentrating.  Feeling anxious, irritable, or depressed.  Developing stress-related health problems.  Feeling like you have too little time for your own life. Follow these instructions at home:  Make sure that you and the person you are caring for:  Get regular sleep.  Exercise regularly.  Eat regular, nutritious meals.  Drink enough fluid to keep your urine clear or pale yellow.  Take over-the-counter and prescription medicines only as told by your health care providers.  Attend all scheduled health care appointments.  Join a support group with others who are caregivers.  Ask about respite care resources so that you can have a regular break from the stress of caregiving.  Look for signs of stress in yourself and in the person you are caring for. If you notice signs of stress, take steps to manage it.  Consider any safety risks and take steps to avoid them.  Organize medications in a pill box for each day of the week.  Create a plan to handle any legal or financial matters. Get legal or financial advice if needed.  Keep a calendar in a central location to remind the person of appointments or other activities. Tips for reducing the risk of injury  Keep floors clear of clutter. Remove rugs, magazine racks, and floor lamps.  Keep hallways well lit, especially at night.  Put a handrail and nonslip mat in the bathtub  or shower.  Put childproof locks on cabinets that contain dangerous items, such as medicines, alcohol, guns, toxic cleaning items, sharp tools or utensils, matches, and lighters.  Put the locks in places where the person cannot see or reach them easily. This will help ensure that the person does not wander out of the house and get lost.  Be prepared for emergencies. Keep a list of emergency  phone numbers and addresses in a convenient area.  Remove car keys and lock garage doors so that the person does not try to get in the car and drive.  Have the person wear a bracelet that tracks locations and identifies the person as having memory problems. This should be worn at all times for safety. Where to find support: Many individuals and organizations offer support. These include:  Support groups for people with dementia and for caregivers.  Counselors or therapists.  Home health care services.  Adult day care centers. Where to find more information: Alzheimer's Association: [employer] Contact a health care provider if:  The person's health is rapidly getting worse.  You are no longer able to care for the person.  Caring for the person is affecting your physical and emotional health.  The person threatens himself or herself, you, or anyone else. Summary  Dementia is a term used to describe a number of symptoms that affect memory and thinking.  Dementia usually gets worse slowly over time.  Take steps to reduce the person's risk of injury, and to plan for future care.  Caregivers need support, relief from caregiving, and time for their own lives. This information is not intended to replace advice given to you by your health care provider. Make sure you discuss any questions you have with your health care provider. Document Released: 09/08/2016 Document Revised: 09/08/2016 Document Reviewed: 09/08/2016 Elsevier Interactive Patient Education  2017 [employer].

## 2017-02-20 NOTE — Discharge Summary (Addendum)
Physician Discharge Summary  JACEE ENERSON PVV:[id number] DOB: 1941/07/31 DOA: 02/19/2017  PCP: Lavone Orn, MD  Admit date: 02/19/2017 Discharge date: 02/20/2017  Admitted From: Home Disposition:  Home  Recommendations for Outpatient Follow-up:  1. Follow up with PCP in 1-2 weeks.Patient's B12 level is low normal. Please evaluate to see if she would benefit from vitamin B12 injections instead of oral supplement. Patient's Synthroid dose have been increased. 2. Please check thiamine level, RPR, HIV antibody during outpatient follow-up (sent on 5/4) 3.  Patient encouraged to make follow up with her psychologist. 4. Follow-up with her neurologist in 3 weeks.  Home Health: None Equipment/Devices: None  Discharge Condition:*Stable CODE STATUS: Full code Diet recommendation: Regular    Discharge Diagnoses:  Principal Problem:   Acute metabolic encephalopathy   Active Problems:   GERD (gastroesophageal reflux disease)   Depression   HLD (hyperlipidemia)   Benign essential HTN   Hypothyroidism   Dementia without behavioral disturbance  Brief narrative/history of present illness Please refer to admission H&P for details, in brief, 76 year old female with history of hypertension, GERD, hypothyroidism, depression, dj vu seizures (follows with Dr. Carles Collet with neurology and not on any medications), hyperlipidemia who was brought to the ED by her daughter for progressive change in her memory. On the morning of admission she was acting differently. She was very disoriented and unable to recall the events in the past 2 years. She has been undergoing significant stressors recently with her daughter falling ill, recent self-harm home and moving in with her daughter. She has strange expression in her face and could not remember that she had moved in with her daughter. (This was almost 2 and half years ago). She was not oriented with place and person. She also did not remember her recent beach trip. MRI  done on admission was unremarkable except for chronic atrophy (unchanged from last imaging in 2016). Neuro hospitalist consulted and patient transferred to [facility] for further workup. Daughter reports that patient was started on low-dose Ativan recently for anxiety by her PCP.  Hospital course Acute encephalopathy with memory loss MRI without acute findings. TSH elevated. I have increased her Synthroid dose. Low vitamin B12 level (220). She is on B12 supplement which will be continued. Will recommend PCP to evaluate if B12 injections would be more effective. -No signs of infection. -ESR and ammonia level are normal. -Patient also has underlying depression and significant psychosocial stresses. I have encouraged her to follow-up with her psychologist as outpatient and may need to adjust her antidepressants. -I will discontinue her Ativan as both patient and daughter feels it is not helping her with her symptoms and may be making her more confused. Ordered thiamine, HIV antibody, RPR, which can be  followed as outpatient. Neurology consult appreciated. EEG done without epileptiform activity.  Follow-up with her neurologist  in 3-4 weeks  Hypothyroidism Elevated TSH (10.17). Increased Synthroid dose. Follow as outpatient.  Vitamin B12 deficiency Low normal levels (320). Continue home medication. May benefit from injectable B12.  Chronic depression Resume home medications. Encouraged outpatient follow-up with psychologist.  Essential hypertension Stable. Continue beta blocker.  Procedure: MRI brain EEG  Consults: Neurology   family communication: Daughter at bedside  Disposition: Home  Discharge Instructions   Allergies as of 02/20/2017      Reactions   Dilaudid [hydromorphone] Other (See Comments)   Reaction:  Hallucinations    Dulera [mometasone Furo-formoterol Fum] Other (See Comments)   Reaction:  Deja vu seizures  Lactose Intolerance (gi) Diarrhea   Morphine And  Related Nausea And Vomiting   Percocet [oxycodone-acetaminophen] Other (See Comments)   Reaction:  Hallucinations    Pravastatin Other (See Comments)   Reaction:  Muscle pain    Vesicare [solifenacin] Other (See Comments)   Reaction:  Constipation       Medication List    STOP taking these medications   LORazepam 1 MG tablet Commonly known as:  ATIVAN     TAKE these medications   albuterol 108 (90 Base) MCG/ACT inhaler Commonly known as:  PROVENTIL HFA;VENTOLIN HFA Inhale 2 puffs into the lungs every 4 (four) hours as needed for wheezing.   budesonide-formoterol 80-4.5 MCG/ACT inhaler Commonly known as:  SYMBICORT Take 2 puffs first thing in am and then another 2 puffs about 12 hours later. What changed:  how much to take  how to take this  when to take this  additional instructions   buPROPion 150 MG 24 hr tablet Commonly known as:  WELLBUTRIN XL Take 150 mg by mouth daily.   CALCIUM 600+D 600-400 MG-UNIT tablet Generic drug:  Calcium Carbonate-Vitamin D Take 1 tablet by mouth daily.   cetirizine 10 MG tablet Commonly known as:  ZYRTEC Take 10 mg by mouth daily as needed for allergies.   cholecalciferol 1000 units tablet Commonly known as:  VITAMIN D Take 1,000 Units by mouth daily.   escitalopram 20 MG tablet Commonly known as:  LEXAPRO Take 20 mg by mouth daily.   famotidine 20 MG tablet Commonly known as:  PEPCID One at bedtime What changed:  how much to take  how to take this  when to take this  reasons to take this  additional instructions   levothyroxine 100 MCG tablet Commonly known as:  SYNTHROID, LEVOTHROID Take 1 tablet (100 mcg total) by mouth daily before breakfast. What changed:  medication strength  how much to take   metoprolol succinate 50 MG 24 hr tablet Commonly known as:  TOPROL-XL Take 50 mg by mouth daily. Take with or immediately following a meal.   vitamin B-12 1000 MCG tablet Commonly known as:   CYANOCOBALAMIN Take 1,000 mcg by mouth daily.      Follow-up Information    Lavone Orn, MD. Schedule an appointment as soon as possible for a visit in 1 week(s).   Specialty:  Internal Medicine Contact information: [address] [place] [address] [phone]        Tat, Eustace Quail, DO Follow up in 3 week(s).   Specialty:  Neurology Contact information: [address]  [address] [place] [address] [phone]          Allergies  Allergen Reactions  . Dilaudid [Hydromorphone] Other (See Comments)    Reaction:  Hallucinations   . Dulera [Mometasone Furo-Formoterol Fum] Other (See Comments)    Reaction:  Deja vu seizures   . Lactose Intolerance (Gi) Diarrhea  . Morphine And Related Nausea And Vomiting  . Percocet [Oxycodone-Acetaminophen] Other (See Comments)    Reaction:  Hallucinations   . Pravastatin Other (See Comments)    Reaction:  Muscle pain   . Vesicare [Solifenacin] Other (See Comments)    Reaction:  Constipation         Procedures/Studies: Mr Brain Wo Contrast (neuro Protocol)  Result Date: 02/19/2017 CLINICAL DATA:  Patient was acting and thinking normally yesterday but when she woke up she was disoriented. Amnestic for event is. EXAM: MRI HEAD WITHOUT CONTRAST TECHNIQUE: Multiplanar, multiecho  pulse sequences of the brain and surrounding structures were obtained without intravenous contrast. COMPARISON:  07/25/2015. FINDINGS: Brain: No evidence for acute infarction, hemorrhage, mass lesion, hydrocephalus, or extra-axial fluid. Moderate cerebral and cerebellar atrophy. Extensive T2 and FLAIR hyperintensities throughout the white matter, representing small vessel disease. Vascular: Flow voids are maintained throughout the carotid, basilar, and vertebral arteries. There are no areas of chronic hemorrhage. Skull and upper cervical spine: Unremarkable visualized calvarium, skullbase, and cervical vertebrae. Pituitary, pineal,  cerebellar tonsils unremarkable. No upper cervical cord lesions. Sinuses/Orbits: No orbital masses or proptosis. Globes appear symmetric. Sinuses appear well aerated, without evidence for air-fluid level. Other: None. Compared with 2016, similar appearance. IMPRESSION: Atrophy and small vessel disease.  No acute intracranial findings. No significant progression from 2016. Electronically Signed   By: Staci Righter M.D.   On: 02/19/2017 14:22   Dg Esophagus  Result Date: 02/04/2017 CLINICAL DATA:  Pharyngo esophageal dysphagia EXAM: ESOPHOGRAM / BARIUM SWALLOW / BARIUM TABLET STUDY TECHNIQUE: Combined double contrast and single contrast examination performed using effervescent crystals, thick barium liquid, and thin barium liquid. The patient was observed with fluoroscopy swallowing a 13 mm barium sulphate tablet. FLUOROSCOPY TIME:  Fluoroscopy Time:  0 minutes 54 second Radiation Exposure Index (if provided by the fluoroscopic device): Number of Acquired Spot Images: 0 COMPARISON:  None. FINDINGS: Esophageal mucosa and motility normal. Negative for stricture or mass lesion. Negative for hiatal hernia.  Negative for gastroesophageal reflux. Barium tablet passed readily into the stomach without delay IMPRESSION: Negative esophagram Electronically Signed   By: Franchot Gallo M.D.   On: 02/04/2017 10:53       Subjective: Seen and examined. Appears more oriented today.  Discharge Exam: Vitals:   02/20/17 0943 02/20/17 1331  BP: (!) 122/43 (!) 141/62  Pulse: 67 65  Resp: 18 18  Temp: 99 F (37.2 C) 98.8 F (37.1 C)   Vitals:   02/20/17 0400 02/20/17 0600 02/20/17 0943 02/20/17 1331  BP: (!) 148/68 (!) 145/69 (!) 122/43 (!) 141/62  Pulse: 69 68 67 65  Resp: 16 18 18 18   Temp: 98.6 F (37 C) 98.6 F (37 C) 99 F (37.2 C) 98.8 F (37.1 C)  TempSrc: Oral Oral Oral Oral  SpO2: 99% 100% 97% 98%  Weight:      Height:        General: Elderly female not in distress  HEENT: Mucosa, supple  neck Chest: Clear to auscultation bilaterally CVS: Normal S1 and S2, no murmurs GI: Soft, nondistended, nontender Musculoskeletal: Warm, no edema  CNS: Alert and oriented 2, nonfocal    The results of significant diagnostics from this hospitalization (including imaging, microbiology, ancillary and laboratory) are listed below for reference.     Microbiology: No results found for this or any previous visit (from the past 240 hour(s)).   Labs: BNP (last 3 results) No results for input(s): BNP in the last 8760 hours. Basic Metabolic Panel:  Recent Labs Lab 02/19/17 0935  NA 138  K 4.6  CL 101  CO2 28  GLUCOSE 112*  BUN 14  CREATININE 0.87  CALCIUM 9.4   Liver Function Tests:  Recent Labs Lab 02/19/17 0935  AST 28  ALT 21  ALKPHOS 69  BILITOT 0.4  PROT 7.4  ALBUMIN 4.6   No results for input(s): LIPASE, AMYLASE in the last 168 hours.  Recent Labs Lab 02/20/17 1047  AMMONIA 32   CBC:  Recent Labs Lab 02/19/17 0935  WBC 7.3  NEUTROABS 5.2  HGB 12.5  HCT 37.3  MCV 92.1  PLT 260   Cardiac Enzymes:  Recent Labs Lab 02/19/17 0935  TROPONINI <0.03   BNP: Invalid input(s): POCBNP CBG:  Recent Labs Lab 02/19/17 0939 02/19/17 2316 02/20/17 0648 02/20/17 1114  GLUCAP 112* 106* 101* 106*   D-Dimer No results for input(s): DDIMER in the last 72 hours. Hgb A1c No results for input(s): HGBA1C in the last 72 hours. Lipid Profile No results for input(s): CHOL, HDL, LDLCALC, TRIG, CHOLHDL, LDLDIRECT in the last 72 hours. Thyroid function studies  Recent Labs  02/20/17 1047  TSH 10.174*   Anemia work up  Recent Labs  02/20/17 1047  VITAMINB12 312   Urinalysis    Component Value Date/Time   COLORURINE YELLOW 02/19/2017 1031   APPEARANCEUR CLEAR 02/19/2017 1031   LABSPEC 1.012 02/19/2017 1031   PHURINE 6.0 02/19/2017 1031   GLUCOSEU NEGATIVE 02/19/2017 1031   [place] 02/19/2017 1031   [place] 02/19/2017 1031    KETONESUR NEGATIVE 02/19/2017 1031   PROTEINUR NEGATIVE 02/19/2017 1031   UROBILINOGEN 1.0 01/05/2013 1929   NITRITE NEGATIVE 02/19/2017 1031   LEUKOCYTESUR NEGATIVE 02/19/2017 1031   Sepsis Labs Invalid input(s): PROCALCITONIN,  WBC,  LACTICIDVEN Microbiology No results found for this or any previous visit (from the past 240 hour(s)).   Time coordinating discharge: < 30 minutes  SIGNED:   Louellen Molder, MD  [facility] 02/20/2017, 3:26 PM Pager   If 7PM-7AM, please contact night-coverage [email or web address] Password [email or web address]

## 2017-02-20 NOTE — Procedures (Signed)
Electroencephalogram (EEG) Report  Date of study: 02/20/17  Requesting clinician: Charlesetta Shanks, M.D.  Reason for study: Evaluate for seizure  Brief clinical history: This is a 76 year old woman admitted for confusion. EEG is being performed for further evaluation.  Medications:  Current Facility-Administered Medications:  .  acetaminophen (TYLENOL) tablet 650 mg, 650 mg, Oral, Q6H PRN **OR** acetaminophen (TYLENOL) suppository 650 mg, 650 mg, Rectal, Q6H PRN, Mariel Aloe, MD .  albuterol (PROVENTIL) (2.5 MG/3ML) 0.083% nebulizer solution 3 mL, 3 mL, Inhalation, Q4H PRN, Mariel Aloe, MD .  buPROPion (WELLBUTRIN XL) 24 hr tablet 150 mg, 150 mg, Oral, Daily, Nettey, Evalee Jefferson, MD .  calcium-vitamin D (OSCAL WITH D) 500-200 MG-UNIT per tablet 1 tablet, 1 tablet, Oral, Daily, Mariel Aloe, MD .  cholecalciferol (VITAMIN D) tablet 1,000 Units, 1,000 Units, Oral, Daily, Nettey, Ralph A, MD .  enoxaparin (LOVENOX) injection 40 mg, 40 mg, Subcutaneous, QHS, Mariel Aloe, MD, 40 mg at 02/19/17 2350 .  escitalopram (LEXAPRO) tablet 20 mg, 20 mg, Oral, Daily, Nettey, Ralph A, MD .  famotidine (PEPCID) IVPB 20 mg premix, 20 mg, Intravenous, Q12H PRN, Opyd, Ilene Qua, MD .  labetalol (NORMODYNE,TRANDATE) injection 5-10 mg, 5-10 mg, Intravenous, Q2H PRN, Opyd, Ilene Qua, MD .  levothyroxine (SYNTHROID, LEVOTHROID) tablet 75 mcg, 75 mcg, Oral, QAC breakfast, Nettey, Ralph A, MD .  LORazepam (ATIVAN) injection 0.5-1 mg, 0.5-1 mg, Intravenous, BID PRN, Opyd, Zarra Geffert S, MD .  sodium chloride flush (NS) 0.9 % injection 3 mL, 3 mL, Intravenous, Q12H, Mariel Aloe, MD, 3 mL at 02/19/17 2350  Description: This is a routine EEG performed using standard international 10-20 electrode placement. A total of 18 channels are recorded, including one for the EKG. The patient is awake and drowsy during the recording.   Activating Maneuvers: None  Findings:  The EKG channel demonstrates a regular rhythm  with a rate of 60 beats per minute.   The background consists of well-formed alpha activity. The best dominant posterior rhythm is 10-11 Hz. This is symmetric and reacts as expected with eye opening. Voltages are mildly reduced throughout. There is a mild degree of superimposed beta activity with a frontal predominance.  There is no focal slowing. Occasional sharp discharges are noted in the right posterior frontal region, some of which are followed by a small slow wave component. These are maximal around the F8 electrode. No seizures are recorded.   Drowsiness is recorded and is normal in appearance.   Impression:  This is an abnormal EEG due to the presence of occasional sharp discharges in the right posterior frontal region. No seizures. Prominent beta and mildly reduced voltages are noted.  Clinical correlation: The presence of sharp discharges in the right posterior frontal region with suggest cortical irritability in this area. Recommend correlation with neuroimaging. This finding could suggest increased risk of seizure activity. Clinical correlation is recommended. The presence of beta activity with mildly reduced amplitudes is commonly seen with anxiety and/or the use of benzodiazepines.   Melba Coon, MD [facility]

## 2017-02-20 NOTE — Progress Notes (Signed)
Pt d/c to home by car with family. Assessment stable. Prescription given. All questions answered 

## 2017-02-20 NOTE — Progress Notes (Signed)
EEG Completed; Results Pending  

## 2017-02-21 LAB — HIV ANTIBODY (ROUTINE TESTING W REFLEX): HIV SCREEN 4TH GENERATION: NONREACTIVE

## 2017-02-21 LAB — RPR: RPR: NONREACTIVE

## 2017-02-21 LAB — VITAMIN B1

## 2017-02-24 LAB — VITAMIN B1: VITAMIN B1 (THIAMINE): 178.4 nmol/L (ref 66.5–200.0)

## 2017-02-26 DIAGNOSIS — R413 Other amnesia: Secondary | ICD-10-CM | POA: Diagnosis not present

## 2017-02-26 DIAGNOSIS — E039 Hypothyroidism, unspecified: Secondary | ICD-10-CM | POA: Diagnosis not present

## 2017-02-26 DIAGNOSIS — E538 Deficiency of other specified B group vitamins: Secondary | ICD-10-CM | POA: Diagnosis not present

## 2017-02-26 DIAGNOSIS — F3341 Major depressive disorder, recurrent, in partial remission: Secondary | ICD-10-CM | POA: Diagnosis not present

## 2017-03-01 ENCOUNTER — Telehealth: Payer: Self-pay | Admitting: Neurology

## 2017-03-01 NOTE — Telephone Encounter (Signed)
Received a copy of patient's psychology evaluation from 01/05/2017.  I received this from the patient's primary care physician and not from the psychologist Mitzi Hansen Mitchum).  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  He stated that the patient told him that certain smells, including "a particular shampoo" were triggers for her to have events, so he felt that perhaps having an EEG with this shampoo would benefit.  He also felt that perhaps a trial of carbamazepine would be of benefit.  I talked to the patient's primary care physician on the telephone today, after reading her hospital records.  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  It was felt that this was likely a psychiatric event.  I talked to my partner, an epileptologist, about her and she is willing to see the patient in consult.  I will discuss with the patient when I see her in a few weeks.

## 2017-03-02 ENCOUNTER — Ambulatory Visit: Payer: Medicare Other

## 2017-03-09 ENCOUNTER — Ambulatory Visit: Payer: Self-pay | Admitting: Internal Medicine

## 2017-03-10 DIAGNOSIS — F419 Anxiety disorder, unspecified: Secondary | ICD-10-CM | POA: Diagnosis not present

## 2017-03-16 NOTE — Progress Notes (Signed)
Subjective:   Suzanne Galloway was seen in consultation in the movement disorder clinic at the request of Lavone Orn, MD.  The evaluation is for tremor.  The records that were made available to me were reviewed.  Pt presents for evaluation of tremor, mild headache and balance change.  Reports that sx's started earlier in 2016, after a hospitalization in 12/2014.  After the hospitalization, she states that she felt a little uncoordinated, "like my legs don't want to go where they are told."  Tremor is intermittent.  "I am solid today."  It is in the right hand.  It is with use of the hand.  No trouble with ADL's, or eating soup.  States that she also has a feeling of "deja vu" and when she has that she will feel near syncopal.  She will feel like "ice water is running through her veins."  She feels like "in the old days when you would be given ether, and right before you would pass out."  That may happen one time a week or it may be several weeks before she has them.  She started dulera in 01/2015.     Tremor characteristics: Affected by caffeine:  No. (2 cups coffee, 2 coke per day) Affected by alcohol:  Doesn't drink enough to know Affected by stress:  Unsure but under lots of stress Affected by fatigue:  Yes.   Spills soup if on spoon:  No. Spills glass of liquid if full:  No. Affects ADL's (tying shoes, brushing teeth, etc):  No.  I reviewed her MRI of the brain from 07/26/15 and there was a moderate amount of small vessel disease.  She has a hx of hyperlipidemia.    06/16/16 update:  The records that were made available to me were reviewed.  It's been almost a year since I have seen her. She is accompanied by her daughter who supplements the history.   At that time, she was c/o tremor but I didn't really see any on examination.  She was also c/o deja vu episodes.  She had a routine EEG that was negative.  She called several times with "seizures" and said that her seizures consisted of "ice through  the veins and hyper deja vu."  She then had a 48 hour ambulatory EEG that was negative.  She reports that when her dulera was d/c the deja vu spells went away.  Today, however, she states that these came back about 3 weeks ago.  They occur daily and up to 2-3 times a day.  They can awaken her up out of sleep.  Pt/daughter also c/o tremor "slightly" when she is using the hands.  No tremor at rest.  Seems to be both hands, but the R is worse than the L.  She takes symbicort daily and albuterol about 1 time a month.  Daughter also c/o a "quiver" in pts voice but not a loss of voice.  Pt states that she worries about PD, and "I just want this ruled out."  Her mother, brother and paternal GM had PD.  She did have an EMG after last visit.  No evidence of large fiber PN but here was evidence of chronic L2-5 radiculopathy.  Daughter does state that depression is increasing but PCP didn't want to increase/change meds given other things going on.  Also c/o memory change (daughter does) but there is lots of family stress (daughter has breast CA/other has RA) and they are planning to see Dr.  Gutterman.    03/17/17 update:  Pt seen today in f/u, accompanied by her daughter who supplements the history.  The records that were made available to me were reviewed.  I have also talked on the phone with her PCP about her case.  Pt has a hx of "deja vu" spells and they have been worked up fairly extensively with EEG and ambulatory in the past.  She was offered and refused EMU previously.  I reviewed records from her psychologist (non physician) who felt that we missed the diagnosis and that she had classic temporal lobe seizure.  Pt reported that fragrances could bring on a spell.   Pt reports that she had 2 spells yesterday and 1 spell today.  She has a feeling of dread come over her for up to 4 hours before she will have a feeling like hot water is being poured over her.  She then will have her feet turn purple and the the spell will  be gone.  Pt reports lots of stress going on - "lets say, 8 years of depression.  My husband died and then my baby girl has macular degeneration, my oldest girl has rheumatoid arthritis and the other has cancer." Pt then admitted to the hospital 02/19/2017 and seen by neurology 02/20/17 with retrograde amnesia for events from 2 years prior.  MRI of the brain was completed on 02/19/2017 and reviewed.  There was atrophy and small vessel disease.  It was nonacute and essentially unchanged from her 2016 examination.  EEG was done during hospital stay and reported to show R temporal sharps.  Reviewed today with epileptologist and felt that these were sharply contoured theta but not epileptiform and could be artifactual.  The neurologist that saw her in the hospital on May 5 felt that symptoms were "most likely psychiatric in nature."  Pt states that she was told by neurologist in the hospital that it was due to medication.  States that even since d/c she has thought that it was 2016 and has trouble remembering it is 2018.  Outside reports reviewed: historical medical records and radiology reports.  Allergies  Allergen Reactions  . Dilaudid [Hydromorphone] Other (See Comments)    Reaction:  Hallucinations   . Dulera [Mometasone Furo-Formoterol Fum] Other (See Comments)    Reaction:  Deja vu seizures   . Lactose Intolerance (Gi) Diarrhea  . Morphine And Related Nausea And Vomiting  . Percocet [Oxycodone-Acetaminophen] Other (See Comments)    Reaction:  Hallucinations   . Pravastatin Other (See Comments)    Reaction:  Muscle pain   . Vesicare [Solifenacin] Other (See Comments)    Reaction:  Constipation     Outpatient Encounter Prescriptions as of 03/17/2017  Medication Sig  . albuterol (PROVENTIL HFA;VENTOLIN HFA) 108 (90 BASE) MCG/ACT inhaler Inhale 2 puffs into the lungs every 4 (four) hours as needed for wheezing.  . budesonide-formoterol (SYMBICORT) 80-4.5 MCG/ACT inhaler Take 2 puffs first thing in  am and then another 2 puffs about 12 hours later. (Patient taking differently: Inhale 2 puffs into the lungs every 12 (twelve) hours. )  . buPROPion (WELLBUTRIN XL) 150 MG 24 hr tablet Take 150 mg by mouth daily.  . Calcium Carbonate-Vitamin D (CALCIUM 600+D) 600-400 MG-UNIT tablet Take 1 tablet by mouth daily.  . cetirizine (ZYRTEC) 10 MG tablet Take 10 mg by mouth daily as needed for allergies.   . cholecalciferol (VITAMIN D) 1000 UNITS tablet Take 1,000 Units by mouth daily.  Marland Kitchen escitalopram (LEXAPRO) 20  MG tablet Take 20 mg by mouth daily.  . famotidine (PEPCID) 20 MG tablet One at bedtime (Patient taking differently: Take 20 mg by mouth at bedtime as needed for heartburn or indigestion. )  . levothyroxine (SYNTHROID, LEVOTHROID) 100 MCG tablet Take 1 tablet (100 mcg total) by mouth daily before breakfast.  . metoprolol succinate (TOPROL-XL) 50 MG 24 hr tablet Take 50 mg by mouth daily. Take with or immediately following a meal.  . vitamin B-12 (CYANOCOBALAMIN) 1000 MCG tablet Take 1,000 mcg by mouth daily.   No facility-administered encounter medications on file as of 03/17/2017.     Past Medical History:  Diagnosis Date  . Arthritis    "hands, back, toes" (01/01/2015)  . Asthma   . Chronic bronchitis ([place])    "get it just about q yr" (01/01/2015)  . Depression   . GERD (gastroesophageal reflux disease)   . High cholesterol   . Hyperlipemia   . Hypothyroidism   . Mitral valve prolapse   . Urinary incontinence     Past Surgical History:  Procedure Laterality Date  . BACK SURGERY    . CARPAL TUNNEL RELEASE Bilateral   . CYSTECTOMY Right 09/2014   "hand"   . DILATION AND CURETTAGE OF UTERUS    . FINGER SURGERY Bilateral    "scraped arthritis out of thumbs"  . INCONTINENCE SURGERY    . LAPAROSCOPIC CHOLECYSTECTOMY    . LUMBAR LAMINECTOMY  1987; 12/2009   ; Archie Endo 01/16/2010  . TOE SURGERY Left    "took bone out of 2nd toe; took cyst out"  . TUBAL LIGATION    . VAGINAL  HYSTERECTOMY  1970    Social History   Social History  . Marital status: Widowed    Spouse name: N/A  . Number of children: 3  . Years of education: N/A   Occupational History  . Retired     [employer]  . Smoking status: Never Smoker  . Smokeless tobacco: Never Used  . Alcohol use Yes     Comment: 01/01/2015 "mixed drink q 2 months or so"  . Drug use: No  . Sexual activity: Not Currently   Other Topics Concern  . Not on file   Social History Narrative  . No narrative on file    Family Status  Relation Status  . Father Deceased       COPD, esophageal cancer, heart disease  . Brother Alive       aneurysm  . Mother Deceased       PD    Review of Systems   A complete 10 system ROS was obtained and was negative apart from what is mentioned.   Objective:   VITALS:   Vitals:   03/17/17 1329  BP: 98/60  Pulse: 80  SpO2: 96%  Weight: 140 lb (63.5 kg)  Height: 5' 2.5" (1.588 m)   Gen:  Appears stated age and in NAD. HEENT:  Normocephalic, atraumatic. The mucous membranes are moist. The superficial temporal arteries are without ropiness or tenderness. Cardiovascular: Regular rate and rhythm. Lungs: Clear to auscultation bilaterally. Neck: There are no carotid bruits noted bilaterally.  NEUROLOGICAL:  Orientation:  The patient is alert and oriented x 3.  Recent and remote memory are intact.  Attention span and concentration are normal.  Able to name objects and repeat without trouble.  Fund of knowledge is appropriate Cranial nerves: There is good facial symmetry. The pupils are equal round  and reactive to light bilaterally. Fundoscopic exam reveals clear disc margins bilaterally. Extraocular muscles are intact and visual fields are full to confrontational testing. Speech is fluent and clear. Soft palate rises symmetrically and there is no tongue deviation. Hearing is intact to conversational tone. Tone: Tone is good  throughout. Sensation: Sensation is intact to light touch and pinprick throughout (facial, trunk, extremities). Vibration is intact at the bilateral big toe but slightly decreased. There is no extinction with double simultaneous stimulation. There is no sensory dermatomal level identified. Coordination:  The patient has no Dysdiadochokinesia.  Initial finger-nose-finger with the eyes closed demonstrates right index finger to left cheek.  It is directed at the exact same point on the left cheek each time, but after several attempts she does do finger-nose-finger on the right.  Finger-nose-finger on the left is normal. Motor: Strength is 5/5 in the bilateral upper and lower extremities.  Shoulder shrug is equal bilaterally.  There is no pronator drift.  There are no fasciculations noted. DTR's: Deep tendon reflexes are 2/4 at the bilateral biceps, triceps, brachioradialis, 2+ at the bilateral patella and 1/4 at the bilateral achilles.  Plantar responses are downgoing bilaterally. Gait and Station: The patient is able to ambulate without difficulty. The patient is able to heel toe walk without any difficulty. The patient has mild difficulty in relating a tandem fashion.  The patient is able to stand in the Romberg position.   MOVEMENT EXAM: Tremor:  There is no tremor noted.   Lab Results  Component Value Date   VITAMINB12 312 02/20/2017   Lab Results  Component Value Date   HGBA1C 6.3 (H) 01/03/2015      Assessment/Plan:   1.  Deja vu spells  -EEG and ambulatory EEG previously negative.  EEG in hospital in May, 2018 reported to show R temporal sharps.  Reviewed today with epileptologist and felt that these were sharply contoured theta but not epileptiform and could be artifactual. .  Pt reports that spells went away when dulera d/c but have come back.  My suspicion remains fairly low but EMU offered at [facility] and Patient would like to proceed with that.  Also offered her consult with  epileptologist.  She would like to hold until we do EMU and I think that is reasonable.  She has spells daily, so he should be up to easily capture these. 2.  Retrograde amnesia  -hospitalized for this in May and neurohospitalist felt that this was of primary psychiatric origin (conversion).  I agree with this assessment.  -Discussed with the patient that she should not be driving as she reports that she is getting lost because of memory change and amnesia. 3.  Memory loss  -did neuropsych testing on 06/29/16 and no evidence of dementia, but there was evidence of mixed anxiety and depression.  Stated that current psychologist recommended repeat but I am not sure that of value  -I do think that medications could potentially contribute to memory change.  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 4.  Much greater than 50% of this visit was spent in counseling and coordinating care.  Total face to face time:  35 min

## 2017-03-17 ENCOUNTER — Ambulatory Visit (INDEPENDENT_AMBULATORY_CARE_PROVIDER_SITE_OTHER): Payer: Medicare Other | Admitting: Neurology

## 2017-03-17 ENCOUNTER — Telehealth: Payer: Self-pay | Admitting: Neurology

## 2017-03-17 ENCOUNTER — Encounter: Payer: Self-pay | Admitting: Neurology

## 2017-03-17 VITALS — BP 98/60 | HR 80 | Ht 62.5 in | Wt 140.0 lb

## 2017-03-17 DIAGNOSIS — R413 Other amnesia: Secondary | ICD-10-CM

## 2017-03-17 DIAGNOSIS — R412 Retrograde amnesia: Secondary | ICD-10-CM | POA: Diagnosis not present

## 2017-03-17 NOTE — Telephone Encounter (Signed)
Referral for EMU monitoring sent to [facility] at [facility] at [phone] with confirmation received. They will call patient to schedule.

## 2017-03-17 NOTE — Addendum Note (Signed)
Addended byAnnamaria Helling on: 03/17/2017 03:23 PM   Modules accepted: Orders

## 2017-03-17 NOTE — Patient Instructions (Signed)
No driving.   They will call you from [facility] to schedule testing.

## 2017-03-19 ENCOUNTER — Ambulatory Visit: Payer: Medicare Other | Admitting: Neurology

## 2017-03-23 ENCOUNTER — Telehealth: Payer: Self-pay | Admitting: Neurology

## 2017-03-23 NOTE — Telephone Encounter (Signed)
Caller: Butch Penny  Urgent? No  Reason for the call: She is waiting on a call from [facility] (Referral from our office Dr. Carles Collet) to have a test to check for Epilepsy. She is wanting to know if her Insurance covers it? Please call. Thanks

## 2017-03-23 NOTE — Telephone Encounter (Signed)
Number previously given was fax number, Number to EMU is [phone] called pt to inform

## 2017-03-23 NOTE — Telephone Encounter (Signed)
Referral was done by [facility] on 03/17/2017 to [facility]  Called patient to inform that referral was done on 5/30 and they will call her with her appointment. I gave her the number to [facility] at [place] if she has any questions about her appointment or if her insurance will cover the test

## 2017-03-24 ENCOUNTER — Telehealth: Payer: Self-pay | Admitting: Neurology

## 2017-03-24 NOTE — Telephone Encounter (Signed)
Copies faxed to [place].

## 2017-03-24 NOTE — Telephone Encounter (Signed)
Sandy with [facility] called and needs a copy of the EEG, and   Ambulatory EEG faxed to her for an order Dr Tat put in so insurance will pay/Dawn  CB# [phone] Fax# [phone]

## 2017-03-29 DIAGNOSIS — M542 Cervicalgia: Secondary | ICD-10-CM | POA: Diagnosis not present

## 2017-04-09 DIAGNOSIS — E039 Hypothyroidism, unspecified: Secondary | ICD-10-CM | POA: Diagnosis not present

## 2017-04-09 DIAGNOSIS — R6889 Other general symptoms and signs: Secondary | ICD-10-CM | POA: Diagnosis not present

## 2017-04-09 DIAGNOSIS — F3342 Major depressive disorder, recurrent, in full remission: Secondary | ICD-10-CM | POA: Diagnosis not present

## 2017-04-19 ENCOUNTER — Other Ambulatory Visit: Payer: Self-pay | Admitting: Internal Medicine

## 2017-04-19 DIAGNOSIS — Z1231 Encounter for screening mammogram for malignant neoplasm of breast: Secondary | ICD-10-CM

## 2017-05-10 DIAGNOSIS — R4189 Other symptoms and signs involving cognitive functions and awareness: Secondary | ICD-10-CM | POA: Diagnosis not present

## 2017-05-10 DIAGNOSIS — Z820 Family history of epilepsy and other diseases of the nervous system: Secondary | ICD-10-CM | POA: Diagnosis not present

## 2017-05-10 DIAGNOSIS — R569 Unspecified convulsions: Secondary | ICD-10-CM | POA: Diagnosis not present

## 2017-05-10 DIAGNOSIS — R9401 Abnormal electroencephalogram [EEG]: Secondary | ICD-10-CM | POA: Diagnosis not present

## 2017-05-10 DIAGNOSIS — R404 Transient alteration of awareness: Secondary | ICD-10-CM | POA: Diagnosis not present

## 2017-05-10 DIAGNOSIS — K5900 Constipation, unspecified: Secondary | ICD-10-CM | POA: Diagnosis not present

## 2017-05-10 DIAGNOSIS — J449 Chronic obstructive pulmonary disease, unspecified: Secondary | ICD-10-CM | POA: Diagnosis not present

## 2017-05-10 DIAGNOSIS — G40909 Epilepsy, unspecified, not intractable, without status epilepticus: Secondary | ICD-10-CM | POA: Diagnosis not present

## 2017-05-10 DIAGNOSIS — Z79899 Other long term (current) drug therapy: Secondary | ICD-10-CM | POA: Diagnosis not present

## 2017-05-10 DIAGNOSIS — F419 Anxiety disorder, unspecified: Secondary | ICD-10-CM | POA: Diagnosis not present

## 2017-05-10 DIAGNOSIS — R269 Unspecified abnormalities of gait and mobility: Secondary | ICD-10-CM | POA: Diagnosis not present

## 2017-05-10 DIAGNOSIS — E785 Hyperlipidemia, unspecified: Secondary | ICD-10-CM | POA: Diagnosis not present

## 2017-05-10 DIAGNOSIS — F329 Major depressive disorder, single episode, unspecified: Secondary | ICD-10-CM | POA: Diagnosis not present

## 2017-05-10 DIAGNOSIS — G40109 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, not intractable, without status epilepticus: Secondary | ICD-10-CM | POA: Diagnosis not present

## 2017-05-10 DIAGNOSIS — R251 Tremor, unspecified: Secondary | ICD-10-CM | POA: Diagnosis not present

## 2017-05-11 DIAGNOSIS — F329 Major depressive disorder, single episode, unspecified: Secondary | ICD-10-CM | POA: Diagnosis not present

## 2017-05-11 DIAGNOSIS — R4189 Other symptoms and signs involving cognitive functions and awareness: Secondary | ICD-10-CM | POA: Diagnosis not present

## 2017-05-11 DIAGNOSIS — R404 Transient alteration of awareness: Secondary | ICD-10-CM | POA: Diagnosis not present

## 2017-05-11 DIAGNOSIS — R9401 Abnormal electroencephalogram [EEG]: Secondary | ICD-10-CM | POA: Diagnosis not present

## 2017-05-12 DIAGNOSIS — E785 Hyperlipidemia, unspecified: Secondary | ICD-10-CM | POA: Diagnosis not present

## 2017-05-12 DIAGNOSIS — R569 Unspecified convulsions: Secondary | ICD-10-CM | POA: Diagnosis not present

## 2017-05-12 DIAGNOSIS — G40109 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, not intractable, without status epilepticus: Secondary | ICD-10-CM | POA: Insufficient documentation

## 2017-05-12 DIAGNOSIS — R4189 Other symptoms and signs involving cognitive functions and awareness: Secondary | ICD-10-CM | POA: Diagnosis not present

## 2017-05-12 DIAGNOSIS — R269 Unspecified abnormalities of gait and mobility: Secondary | ICD-10-CM | POA: Diagnosis not present

## 2017-05-12 DIAGNOSIS — R251 Tremor, unspecified: Secondary | ICD-10-CM | POA: Diagnosis not present

## 2017-05-12 DIAGNOSIS — Z79899 Other long term (current) drug therapy: Secondary | ICD-10-CM | POA: Diagnosis not present

## 2017-05-12 DIAGNOSIS — Z820 Family history of epilepsy and other diseases of the nervous system: Secondary | ICD-10-CM | POA: Diagnosis not present

## 2017-05-12 HISTORY — DX: 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, not intractable, without status epilepticus: G40.109

## 2017-05-13 DIAGNOSIS — F329 Major depressive disorder, single episode, unspecified: Secondary | ICD-10-CM | POA: Diagnosis not present

## 2017-05-13 DIAGNOSIS — G40109 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, not intractable, without status epilepticus: Secondary | ICD-10-CM | POA: Diagnosis not present

## 2017-05-13 DIAGNOSIS — F419 Anxiety disorder, unspecified: Secondary | ICD-10-CM | POA: Diagnosis not present

## 2017-05-25 DIAGNOSIS — H43813 Vitreous degeneration, bilateral: Secondary | ICD-10-CM | POA: Diagnosis not present

## 2017-05-25 DIAGNOSIS — H26493 Other secondary cataract, bilateral: Secondary | ICD-10-CM | POA: Diagnosis not present

## 2017-05-25 DIAGNOSIS — Z961 Presence of intraocular lens: Secondary | ICD-10-CM | POA: Diagnosis not present

## 2017-05-28 ENCOUNTER — Ambulatory Visit
Admission: RE | Admit: 2017-05-28 | Discharge: 2017-05-28 | Disposition: A | Discharge status: Home / Self Care | Payer: Medicare Other | Source: Ambulatory Visit | Attending: Internal Medicine | Admitting: Internal Medicine

## 2017-05-28 DIAGNOSIS — Z1231 Encounter for screening mammogram for malignant neoplasm of breast: Secondary | ICD-10-CM

## 2017-06-11 ENCOUNTER — Encounter: Payer: Self-pay | Admitting: *Deleted

## 2017-06-11 ENCOUNTER — Other Ambulatory Visit: Payer: Self-pay | Admitting: *Deleted

## 2017-06-11 DIAGNOSIS — G40109 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, not intractable, without status epilepticus: Secondary | ICD-10-CM | POA: Insufficient documentation

## 2017-06-11 NOTE — Patient Outreach (Addendum)
Initial home visit - note this patient is not in the [facility] population as her insurance is [employer].  I visited Suzanne Galloway today at her request. She has recently been diagnosed with partial epilepsy. She has been initiated on Lamictal that was not effective for her, she had increased seizure activity. Then oxcarbazepine which oversedated her. Now she is on Zonegran and it is working. She has not had a seizure in 5 days, so that is very good progress.  She is very isolated during the day, while her daughter and son-in-law are at work, as she cannot drive now, so that is very depressing. She has been taken off Wellbutrin and Celexa and started on Duloxetine. Lovinia thinks this is helping her with her depression.  She admits to some memory impairment.  Outpatient Encounter Prescriptions as of 06/11/2017  Medication Sig Note  . albuterol (PROVENTIL HFA;VENTOLIN HFA) 108 (90 BASE) MCG/ACT inhaler Inhale 2 puffs into the lungs every 4 (four) hours as needed for wheezing.   . budesonide-formoterol (SYMBICORT) 80-4.5 MCG/ACT inhaler Take 2 puffs first thing in am and then another 2 puffs about 12 hours later. (Patient taking differently: Inhale 2 puffs into the lungs every 12 (twelve) hours. )   . Calcium Carbonate-Vitamin D (CALCIUM 600+D) 600-400 MG-UNIT tablet Take 1 tablet by mouth daily.   . cetirizine (ZYRTEC) 10 MG tablet Take 10 mg by mouth daily as needed for allergies.    . DULoxetine (CYMBALTA) 60 MG capsule Take 60 mg by mouth daily.   . famotidine (PEPCID) 20 MG tablet One at bedtime (Patient taking differently: Take 20 mg by mouth at bedtime as needed for heartburn or indigestion. ) 06/11/2017: Takes just as needed.  Marland Kitchen levothyroxine (SYNTHROID, LEVOTHROID) 100 MCG tablet Take 1 tablet (100 mcg total) by mouth daily before breakfast.   . metoprolol succinate (TOPROL-XL) 50 MG 24 hr tablet Take 50 mg by mouth daily. Take with or immediately following a meal.   . vitamin B-12 (CYANOCOBALAMIN) 1000 MCG tablet  Take 1,000 mcg by mouth daily.   Marland Kitchen zonisamide (ZONEGRAN) 100 MG capsule Take 100 mg by mouth daily.   . [DISCONTINUED] buPROPion (WELLBUTRIN XL) 150 MG 24 hr tablet Take 150 mg by mouth daily.   . [DISCONTINUED] cholecalciferol (VITAMIN D) 1000 UNITS tablet Take 1,000 Units by mouth daily.   . [DISCONTINUED] escitalopram (LEXAPRO) 20 MG tablet Take 20 mg by mouth daily.    No facility-administered encounter medications on file as of 06/11/2017.    Patient was recently discharged from hospital and all medications have been reviewed.  BP 122/60 (BP Location: Left Arm, Patient Position: Sitting)   Pulse 86   Resp 16   Ht 1.588 m (5' 2.5")   Wt 138 lb (62.6 kg)   SpO2 98%   BMI 24.84 kg/m    RRR  Lungs were clear.  She is walking without any problems, eating well (she has lost about 12 #) throughout this ordeal.  Fall Risk  03/17/2017 06/16/2016  Falls in the past year? No No   Depression screen PHQ 2/9 06/11/2017  Decreased Interest 1  Down, Depressed, Hopeless 1  PHQ - 2 Score 2  Altered sleeping 0  Tired, decreased energy 2  Change in appetite 0  Feeling bad or failure about yourself  0  Trouble concentrating 1  Moving slowly or fidgety/restless 0  Suicidal thoughts 0  PHQ-9 Score 5  Difficult doing work/chores Not difficult at all   A:  Partial Epilepsy  Depression - improving      HTN well controlled   P: Counseled pt on safety issues. Listened and supported her. We discussed hiring an [employer] driver to do some local outings. Encouraged her to reach out to her church family.   I will see her in another 2 weeks and she knows to contact me if she has any problems or questions.   [name]. Myrtie Neither, MSN, [facility] Gerontological Nurse Practitioner [facility] Care Management [phone]

## 2017-06-24 DIAGNOSIS — Z79899 Other long term (current) drug therapy: Secondary | ICD-10-CM | POA: Diagnosis not present

## 2017-06-24 DIAGNOSIS — F418 Other specified anxiety disorders: Secondary | ICD-10-CM | POA: Diagnosis not present

## 2017-06-24 DIAGNOSIS — G40109 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, not intractable, without status epilepticus: Secondary | ICD-10-CM | POA: Diagnosis not present

## 2017-06-25 ENCOUNTER — Ambulatory Visit: Payer: Self-pay | Admitting: *Deleted

## 2017-07-08 DIAGNOSIS — J441 Chronic obstructive pulmonary disease with (acute) exacerbation: Secondary | ICD-10-CM | POA: Diagnosis not present

## 2017-07-09 ENCOUNTER — Ambulatory Visit (INDEPENDENT_AMBULATORY_CARE_PROVIDER_SITE_OTHER): Payer: Medicare Other | Admitting: Adult Health

## 2017-07-09 ENCOUNTER — Encounter: Payer: Self-pay | Admitting: Adult Health

## 2017-07-09 ENCOUNTER — Ambulatory Visit (INDEPENDENT_AMBULATORY_CARE_PROVIDER_SITE_OTHER)
Admission: RE | Admit: 2017-07-09 | Discharge: 2017-07-09 | Disposition: A | Discharge status: Home / Self Care | Payer: Medicare Other | Source: Ambulatory Visit | Attending: Adult Health | Admitting: Adult Health

## 2017-07-09 DIAGNOSIS — J209 Acute bronchitis, unspecified: Secondary | ICD-10-CM

## 2017-07-09 DIAGNOSIS — J4521 Mild intermittent asthma with (acute) exacerbation: Secondary | ICD-10-CM

## 2017-07-09 DIAGNOSIS — R05 Cough: Secondary | ICD-10-CM | POA: Diagnosis not present

## 2017-07-09 HISTORY — DX: Acute bronchitis, unspecified: J20.9

## 2017-07-09 NOTE — Progress Notes (Signed)
Chart and office note reviewed in detail  > agree with a/p as outlined    

## 2017-07-09 NOTE — Assessment & Plan Note (Signed)
Flare  Check xr  Plan  Patient Instructions  Finish Augmentin as directed.  Mucinex DM Twice daily  As needed  Cough/congestion .  Tessalon 3 times daily as needed for cough. Albuterol as needed for wheezing. Chest x-ray today Follow with Dr. Melvyn Novas in 6 months and as needed. Please contact office for sooner follow up if symptoms do not improve or worsen or seek emergency care

## 2017-07-09 NOTE — Assessment & Plan Note (Signed)
Slowly resolving flare with bronchitis  Plan  Patient Instructions  Finish Augmentin as directed.  Mucinex DM Twice daily  As needed  Cough/congestion .  Tessalon 3 times daily as needed for cough. Albuterol as needed for wheezing. Chest x-ray today Follow with Dr. Melvyn Novas in 6 months and as needed. Please contact office for sooner follow up if symptoms do not improve or worsen or seek emergency care

## 2017-07-09 NOTE — Patient Instructions (Addendum)
Finish Augmentin as directed.  Mucinex DM Twice daily  As needed  Cough/congestion .  Tessalon 3 times daily as needed for cough. Albuterol as needed for wheezing. Chest x-ray today Follow with Dr. Melvyn Novas in 6 months and as needed. Please contact office for sooner follow up if symptoms do not improve or worsen or seek emergency care

## 2017-07-09 NOTE — Progress Notes (Signed)
@Patient  ID: Suzanne Galloway, female    DOB: 02-23-1941, 76 y.o.   MRN: [id number]  Chief Complaint  Patient presents with  . Acute Visit    asthma     Referring provider: Lavone Orn, MD  HPI: 76 year old female never smoker followed for recurrent bronchitis and asthma  TEST  spirometry 06/27/2013   FEV1 1.92 (96%) ratio 72 and FEF 25-75 68% - Allergy eval by Harold Hedge 11/16/13  NO nl, neg allergy studies, rec topical nasal rx plus allegra  07/09/2017 Acute OV ; Asthma  Patient returns for an acute office visit .  Last seen in February 2017 Patient complains over last 2 weeks of cough, congestion, thick mucus,-green  wheezing and shortness of breath. She was seen at urgent care yesterday and started on Augmentin. And tessalon .  She's been using albuterol to help with the wheezing or shortness of breath. She is using mucinex dm for cough with some help.  She denies any fever, chest pain, orthopnea, PND or leg swelling Good appetite with no nausea, vomiting or diarrhea   Allergies  Allergen Reactions  . Lamictal [Lamotrigine] Other (See Comments)    Increased siezure activity.  . Oxcarbazepine Other (See Comments)    Over sedation  . Dilaudid [Hydromorphone] Other (See Comments)    Reaction:  Hallucinations   . Dulera [Mometasone Furo-Formoterol Fum] Other (See Comments)    Reaction:  Deja vu seizures   . Lactose Intolerance (Gi) Diarrhea  . Morphine And Related Nausea And Vomiting  . Percocet [Oxycodone-Acetaminophen] Other (See Comments)    Reaction:  Hallucinations   . Pravastatin Other (See Comments)    Reaction:  Muscle pain   . Vesicare [Solifenacin] Other (See Comments)    Reaction:  Constipation     Immunization History  Administered Date(s) Administered  . Influenza Whole 07/19/2012  . Influenza, High Dose Seasonal PF 07/19/2016  . Influenza,inj,Quad PF,6+ Mos 06/27/2013  . Influenza-Unspecified 06/19/2014, 07/20/2015    Past Medical History:  Diagnosis  Date  . Anxiety   . Arthritis    "hands, back, toes" (01/01/2015)  . Asthma   . Chronic bronchitis ([place])    "get it just about q yr" (01/01/2015)  . Depression   . GERD (gastroesophageal reflux disease)   . High cholesterol   . Hyperlipemia   . Hypertension   . Hypothyroidism   . Mitral valve prolapse   . Seizures ([place])   . Urinary incontinence     Tobacco History: History  Smoking Status  . Never Smoker  Smokeless Tobacco  . Never Used   Counseling given: Not Answered   Outpatient Encounter Prescriptions as of 07/09/2017  Medication Sig  . albuterol (PROVENTIL HFA;VENTOLIN HFA) 108 (90 BASE) MCG/ACT inhaler Inhale 2 puffs into the lungs every 4 (four) hours as needed for wheezing.  Marland Kitchen albuterol (PROVENTIL) (2.5 MG/3ML) 0.083% nebulizer solution Take 2.5 mg by nebulization daily. Per PCP Dr Laurann Montana  . budesonide-formoterol (SYMBICORT) 80-4.5 MCG/ACT inhaler Take 2 puffs first thing in am and then another 2 puffs about 12 hours later. (Patient taking differently: Inhale 2 puffs into the lungs every 12 (twelve) hours. )  . Calcium Carbonate-Vitamin D (CALCIUM 600+D) 600-400 MG-UNIT tablet Take 1 tablet by mouth daily.  . cetirizine (ZYRTEC) 10 MG tablet Take 10 mg by mouth daily as needed for allergies.   . DULoxetine (CYMBALTA) 60 MG capsule Take 60 mg by mouth daily.  . famotidine (PEPCID) 20 MG tablet One at bedtime (Patient taking  differently: Take 20 mg by mouth at bedtime as needed for heartburn or indigestion. )  . levothyroxine (SYNTHROID, LEVOTHROID) 100 MCG tablet Take 1 tablet (100 mcg total) by mouth daily before breakfast.  . metoprolol succinate (TOPROL-XL) 50 MG 24 hr tablet Take 50 mg by mouth daily. Take with or immediately following a meal.  . vitamin B-12 (CYANOCOBALAMIN) 1000 MCG tablet Take 1,000 mcg by mouth daily.  Marland Kitchen zonisamide (ZONEGRAN) 100 MG capsule Take 200 mg by mouth at bedtime.    No facility-administered encounter medications on file as of  07/09/2017.      Review of Systems  Constitutional:   No  weight loss, night sweats,  Fevers, chills, fatigue, or  lassitude.  HEENT:   No headaches,  Difficulty swallowing,  Tooth/dental problems, or  Sore throat,                No sneezing, itching, ear ache,  +nasal congestion, post nasal drip,   CV:  No chest pain,  Orthopnea, PND, swelling in lower extremities, anasarca, dizziness, palpitations, syncope.   GI  No heartburn, indigestion, abdominal pain, nausea, vomiting, diarrhea, change in bowel habits, loss of appetite, bloody stools.   Resp: No chest wall deformity  Skin: no rash or lesions.  GU: no dysuria, change in color of urine, no urgency or frequency.  No flank pain, no hematuria   MS:  No joint pain or swelling.  No decreased range of motion.  No back pain.    Physical Exam  BP 126/72 (BP Location: Left Arm, Cuff Size: Normal)   Pulse 72   Ht 5\' 2"  (1.575 m)   Wt 141 lb 12.8 oz (64.3 kg)   SpO2 98%   BMI 25.94 kg/m   GEN: A/Ox3; pleasant , NAD, elderly    HEENT:  [place]/AT,  EACs-clear, TMs-wnl, NOSE-clear, THROAT-clear, no lesions, no postnasal drip or exudate noted.   NECK:  Supple w/ fair ROM; no JVD; normal carotid impulses w/o bruits; no thyromegaly or nodules palpated; no lymphadenopathy.    RESP  Clear  P & A; w/o, wheezes/ rales/ or rhonchi. no accessory muscle use, no dullness to percussion  CARD:  RRR, no m/r/g, no peripheral edema, pulses intact, no cyanosis or clubbing.  GI:   Soft & nt; nml bowel sounds; no organomegaly or masses detected.   Musco: Warm bil, no deformities or joint swelling noted.   Neuro: alert, no focal deficits noted.    Skin: Warm, no lesions or rashes    Lab Results:  CBC  BMET  No results found for: PROBNP  Imaging: No results found.   Assessment & Plan:   Asthma exacerbation Slowly resolving flare with bronchitis  Plan  Patient Instructions  Finish Augmentin as directed.  Mucinex DM Twice daily   As needed  Cough/congestion .  Tessalon 3 times daily as needed for cough. Albuterol as needed for wheezing. Chest x-ray today Follow with Dr. Melvyn Novas in 6 months and as needed. Please contact office for sooner follow up if symptoms do not improve or worsen or seek emergency care        Bronchitis, acute Flare  Check xr  Plan  Patient Instructions  Finish Augmentin as directed.  Mucinex DM Twice daily  As needed  Cough/congestion .  Tessalon 3 times daily as needed for cough. Albuterol as needed for wheezing. Chest x-ray today Follow with Dr. Melvyn Novas in 6 months and as needed. Please contact office for sooner follow up if symptoms  do not improve or worsen or seek emergency care           Rexene Edison, NP 07/09/2017

## 2017-07-12 ENCOUNTER — Telehealth: Payer: Self-pay | Admitting: Adult Health

## 2017-07-12 NOTE — Telephone Encounter (Signed)
ATC pt, no answer. Left message for pt to call back.  

## 2017-07-13 NOTE — Telephone Encounter (Signed)
Called and spoke with pt and she stated that she was told to take the lorazepam prn and she stated that she is having to take this everyday, just once daily.  She stated that when she takes this it does help.  She wanted to make TP aware and wanted to know if any changes needed to be made.  TP please advise. Thanks

## 2017-07-13 NOTE — Telephone Encounter (Signed)
Spoke with pt, she states her question is : If she is taking this everyday she was wondering if TP could put her on something else because she remembers talking to her about it. Possibly a maintenance drug instead of the Lorazepam. Please advise.

## 2017-07-13 NOTE — Telephone Encounter (Signed)
Pt advised message from TP. Pt understood and will call her primary care. Nothing further is needed.

## 2017-07-13 NOTE — Telephone Encounter (Signed)
I have reviewed pt's current and previously medication list, it does not appear that our office has prescribe Lorazepam to pt previously.    TP please advise if you would pt to contact PCP or prescribe maintenance medication. Thanks.

## 2017-07-13 NOTE — Telephone Encounter (Signed)
Sounds okay to me

## 2017-07-13 NOTE — Telephone Encounter (Signed)
Ok that is fine , advised to not get oversedated .

## 2017-07-13 NOTE — Telephone Encounter (Signed)
Per TP: yes please, would recommend she contact her PCP.  Thanks.

## 2017-07-16 DIAGNOSIS — H43813 Vitreous degeneration, bilateral: Secondary | ICD-10-CM | POA: Diagnosis not present

## 2017-07-16 DIAGNOSIS — Z961 Presence of intraocular lens: Secondary | ICD-10-CM | POA: Diagnosis not present

## 2017-07-16 DIAGNOSIS — H26493 Other secondary cataract, bilateral: Secondary | ICD-10-CM | POA: Diagnosis not present

## 2017-07-19 ENCOUNTER — Ambulatory Visit: Payer: Medicare Other

## 2017-07-19 DIAGNOSIS — Z23 Encounter for immunization: Principal | ICD-10-CM

## 2017-07-19 NOTE — Nursing Note (Signed)
The Influenza Vaccine VIS document for the flu injection was given to patient to review. Patient or person named in permission has answered no to any history of egg allergy, previous serious reaction to a influenza vaccine or current illness which would preclude them receiving an immunization. Any questions were referred to the physician. Patient identified by last name and date of birth. The Influenza Vaccine was then administered per protocol. The patient was observed for immediate reactions to the vaccine per protocol. None were observed.   Tammy [name], LVN

## 2017-07-22 DIAGNOSIS — Z961 Presence of intraocular lens: Secondary | ICD-10-CM | POA: Diagnosis not present

## 2017-07-22 DIAGNOSIS — H43813 Vitreous degeneration, bilateral: Secondary | ICD-10-CM | POA: Diagnosis not present

## 2017-07-22 DIAGNOSIS — H04123 Dry eye syndrome of bilateral lacrimal glands: Secondary | ICD-10-CM | POA: Diagnosis not present

## 2017-07-22 DIAGNOSIS — H26491 Other secondary cataract, right eye: Secondary | ICD-10-CM | POA: Diagnosis not present

## 2017-08-03 DIAGNOSIS — K219 Gastro-esophageal reflux disease without esophagitis: Secondary | ICD-10-CM | POA: Diagnosis not present

## 2017-08-03 DIAGNOSIS — Z0000 Encounter for general adult medical examination without abnormal findings: Secondary | ICD-10-CM | POA: Diagnosis not present

## 2017-08-03 DIAGNOSIS — J45909 Unspecified asthma, uncomplicated: Secondary | ICD-10-CM | POA: Diagnosis not present

## 2017-08-03 DIAGNOSIS — G40209 Localization-related (focal) (partial) symptomatic epilepsy and epileptic syndromes with complex partial seizures, not intractable, without status epilepticus: Secondary | ICD-10-CM | POA: Diagnosis not present

## 2017-08-03 DIAGNOSIS — Z23 Encounter for immunization: Secondary | ICD-10-CM | POA: Diagnosis not present

## 2017-08-04 DIAGNOSIS — M19049 Primary osteoarthritis, unspecified hand: Secondary | ICD-10-CM | POA: Diagnosis not present

## 2017-08-04 DIAGNOSIS — E782 Mixed hyperlipidemia: Secondary | ICD-10-CM | POA: Diagnosis not present

## 2017-08-04 DIAGNOSIS — E039 Hypothyroidism, unspecified: Secondary | ICD-10-CM | POA: Diagnosis not present

## 2017-08-04 DIAGNOSIS — J45901 Unspecified asthma with (acute) exacerbation: Secondary | ICD-10-CM | POA: Diagnosis not present

## 2017-08-05 ENCOUNTER — Ambulatory Visit: Payer: Medicare Other | Admitting: Neurology

## 2017-08-05 DIAGNOSIS — H04123 Dry eye syndrome of bilateral lacrimal glands: Secondary | ICD-10-CM | POA: Diagnosis not present

## 2017-08-05 DIAGNOSIS — H26492 Other secondary cataract, left eye: Secondary | ICD-10-CM | POA: Diagnosis not present

## 2017-08-05 DIAGNOSIS — H43813 Vitreous degeneration, bilateral: Secondary | ICD-10-CM | POA: Diagnosis not present

## 2017-08-05 DIAGNOSIS — Z961 Presence of intraocular lens: Secondary | ICD-10-CM | POA: Diagnosis not present

## 2017-09-14 DIAGNOSIS — E039 Hypothyroidism, unspecified: Secondary | ICD-10-CM | POA: Diagnosis not present

## 2017-10-01 DIAGNOSIS — Z13820 Encounter for screening for osteoporosis: Secondary | ICD-10-CM | POA: Diagnosis not present

## 2017-10-01 DIAGNOSIS — Z780 Asymptomatic menopausal state: Secondary | ICD-10-CM | POA: Diagnosis not present

## 2017-10-20 NOTE — Progress Notes (Signed)
Subjective:   Suzanne Galloway was seen in consultation in the movement disorder clinic at the request of Lavone Orn, MD.  The evaluation is for tremor.  The records that were made available to me were reviewed.  Pt presents for evaluation of tremor, mild headache and balance change.  Reports that sx's started earlier in 2016, after a hospitalization in 12/2014.  After the hospitalization, she states that she felt a little uncoordinated, "like my legs don't want to go where they are told."  Tremor is intermittent.  "I am solid today."  It is in the right hand.  It is with use of the hand.  No trouble with ADL's, or eating soup.  States that she also has a feeling of "deja vu" and when she has that she will feel near syncopal.  She will feel like "ice water is running through her veins."  She feels like "in the old days when you would be given ether, and right before you would pass out."  That may happen one time a week or it may be several weeks before she has them.  She started dulera in 01/2015.     Tremor characteristics: Affected by caffeine:  No. (2 cups coffee, 2 coke per day) Affected by alcohol:  Doesn't drink enough to know Affected by stress:  Unsure but under lots of stress Affected by fatigue:  Yes.   Spills soup if on spoon:  No. Spills glass of liquid if full:  No. Affects ADL's (tying shoes, brushing teeth, etc):  No.  I reviewed her MRI of the brain from 07/26/15 and there was a moderate amount of small vessel disease.  She has a hx of hyperlipidemia.    06/16/16 update:  The records that were made available to me were reviewed.  It's been almost a year since I have seen her. She is accompanied by her daughter who supplements the history.   At that time, she was c/o tremor but I didn't really see any on examination.  She was also c/o deja vu episodes.  She had a routine EEG that was negative.  She called several times with "seizures" and said that her seizures consisted of "ice through  the veins and hyper deja vu."  She then had a 48 hour ambulatory EEG that was negative.  She reports that when her dulera was d/c the deja vu spells went away.  Today, however, she states that these came back about 3 weeks ago.  They occur daily and up to 2-3 times a day.  They can awaken her up out of sleep.  Pt/daughter also c/o tremor "slightly" when she is using the hands.  No tremor at rest.  Seems to be both hands, but the R is worse than the L.  She takes symbicort daily and albuterol about 1 time a month.  Daughter also c/o a "quiver" in pts voice but not a loss of voice.  Pt states that she worries about PD, and "I just want this ruled out."  Her mother, brother and paternal GM had PD.  She did have an EMG after last visit.  No evidence of large fiber PN but here was evidence of chronic L2-5 radiculopathy.  Daughter does state that depression is increasing but PCP didn't want to increase/change meds given other things going on.  Also c/o memory change (daughter does) but there is lots of family stress (daughter has breast CA/other has RA) and they are planning to see Dr.  Gutterman.    03/17/17 update:  Pt seen today in f/u, accompanied by her daughter who supplements the history.  The records that were made available to me were reviewed.  I have also talked on the phone with her PCP about her case.  Pt has a hx of "deja vu" spells and they have been worked up fairly extensively with EEG and ambulatory in the past.  She was offered and refused EMU previously.  I reviewed records from her psychologist (non physician) who felt that we missed the diagnosis and that she had classic temporal lobe seizure.  Pt reported that fragrances could bring on a spell.   Pt reports that she had 2 spells yesterday and 1 spell today.  She has a feeling of dread come over her for up to 4 hours before she will have a feeling like hot water is being poured over her.  She then will have her feet turn purple and the the spell will  be gone.  Pt reports lots of stress going on - "lets say, 8 years of depression.  My husband died and then my baby girl has macular degeneration, my oldest girl has rheumatoid arthritis and the other has cancer." Pt then admitted to the hospital 02/19/2017 and seen by neurology 02/20/17 with retrograde amnesia for events from 2 years prior.  MRI of the brain was completed on 02/19/2017 and reviewed.  There was atrophy and small vessel disease.  It was nonacute and essentially unchanged from her 2016 examination.  EEG was done during hospital stay and reported to show R temporal sharps.  Reviewed today with epileptologist and felt that these were sharply contoured theta but not epileptiform and could be artifactual.  The neurologist that saw her in the hospital on May 5 felt that symptoms were "most likely psychiatric in nature."  Pt states that she was told by neurologist in the hospital that it was due to medication.  States that even since d/c she has thought that it was 2016 and has trouble remembering it is 2018.  10/21/16 update: Patient seen today in follow-up.  This patient is accompanied in the office by her daughter who supplements the history. I have not seen her since May.  I have reviewed numerous records.  She was evaluated at the [facility] unit at [facility] and I talked to Dr. Jacelyn Grip after that.  She has been following at [facility] since then.  EMU demonstrated occasional right temporal sharp activity.  There were 2 electrographic seizures identified.  The patient was started on Lamictal.  Was not on Lamictal for long, but had more seizures and it was discontinued.  Was then started on oxcarbazepine.  Her daughter felt that this caused paranoia and confusion and was transitioned to zonisamide.  Patient then called complaining of tremor, which was felt due to anxiety and was started on Ativan as needed.  She was ultimately changed from Ativan to [place].  This change ultimately resulted in more anxiety and panic  attacks and she states that this was d/c by PCP.  She was not able to get into [facility] for a while so came back here.  Daughter states that a few months ago her synthroid was changed and this resulted in confusion/depression and so it was increased back up but not quite as far because of the TSH.  She has not quite been back to normal.  On christmas eve, she forgot her zonegran (200 mg q hs), but took it in the AM.  She did okay then but the day after Xmas she was very confused, didn't know that she lived with her daughter and kept asking "what did we do with the stuff in the attic, what did we do with the stuff in the shed."  She has been a little confused ever since.  Dr. Jacelyn Grip called in a higher dose of zonegran yesterday in case they had a seizure.  Pt frustrated because they were processing paperwork so she could drive.  She asks me about drinking alcohol  Outside reports reviewed: historical medical records and radiology reports.  Allergies  Allergen Reactions  . Lamictal [Lamotrigine] Other (See Comments)    Increased siezure activity.  . Oxcarbazepine Other (See Comments)    Over sedation  . Dilaudid [Hydromorphone] Other (See Comments)    Reaction:  Hallucinations   . Dulera [Mometasone Furo-Formoterol Fum] Other (See Comments)    Reaction:  Deja vu seizures   . Lactose Intolerance (Gi) Diarrhea  . Morphine And Related Nausea And Vomiting  . Percocet [Oxycodone-Acetaminophen] Other (See Comments)    Reaction:  Hallucinations   . Pravastatin Other (See Comments)    Reaction:  Muscle pain   . Vesicare [Solifenacin] Other (See Comments)    Reaction:  Constipation     Outpatient Encounter Medications as of 10/21/2017  Medication Sig  . albuterol (PROVENTIL HFA;VENTOLIN HFA) 108 (90 BASE) MCG/ACT inhaler Inhale 2 puffs into the lungs every 4 (four) hours as needed for wheezing.  Marland Kitchen albuterol (PROVENTIL) (2.5 MG/3ML) 0.083% nebulizer solution Take 2.5 mg by nebulization daily. Per PCP Dr  Laurann Montana  . budesonide-formoterol (SYMBICORT) 80-4.5 MCG/ACT inhaler Take 2 puffs first thing in am and then another 2 puffs about 12 hours later. (Patient taking differently: Inhale 2 puffs into the lungs every 12 (twelve) hours. )  . Calcium Carbonate-Vitamin D (CALCIUM 600+D) 600-400 MG-UNIT tablet Take 1 tablet by mouth daily.  . cetirizine (ZYRTEC) 10 MG tablet Take 10 mg by mouth daily as needed for allergies.   . DULoxetine (CYMBALTA) 60 MG capsule Take 60 mg by mouth daily.  . famotidine (PEPCID) 20 MG tablet One at bedtime (Patient taking differently: Take 20 mg by mouth at bedtime as needed for heartburn or indigestion. )  . levothyroxine (SYNTHROID, LEVOTHROID) 100 MCG tablet Take 1 tablet (100 mcg total) by mouth daily before breakfast.  . LORazepam (ATIVAN) 0.5 MG tablet Take 0.5 mg by mouth daily. Daughter states she states .88 mg  . metoprolol succinate (TOPROL-XL) 50 MG 24 hr tablet Take 50 mg by mouth daily. Take with or immediately following a meal.  . vitamin B-12 (CYANOCOBALAMIN) 1000 MCG tablet Take 1,000 mcg by mouth daily.  Marland Kitchen zonisamide (ZONEGRAN) 100 MG capsule Take 200 mg by mouth at bedtime.    No facility-administered encounter medications on file as of 10/21/2017.     Past Medical History:  Diagnosis Date  . Anxiety   . Arthritis    "hands, back, toes" (01/01/2015)  . Asthma   . Chronic bronchitis ([place])    "get it just about q yr" (01/01/2015)  . Depression   . GERD (gastroesophageal reflux disease)   . High cholesterol   . Hyperlipemia   . Hypertension   . Hypothyroidism   . Mitral valve prolapse   . Seizures ([place])   . Urinary incontinence     Past Surgical History:  Procedure Laterality Date  . BACK SURGERY    . CARPAL TUNNEL RELEASE Bilateral   . CYSTECTOMY Right 09/2014   "  hand"   . DILATION AND CURETTAGE OF UTERUS    . FINGER SURGERY Bilateral    "scraped arthritis out of thumbs"  . INCONTINENCE SURGERY    . LAPAROSCOPIC CHOLECYSTECTOMY    .  LUMBAR LAMINECTOMY  1987; 12/2009   ; Archie Endo 01/16/2010  . TOE SURGERY Left    "took bone out of 2nd toe; took cyst out"  . TUBAL LIGATION    . VAGINAL HYSTERECTOMY  1970    Social History   Socioeconomic History  . Marital status: Widowed    Spouse name: Not on file  . Number of children: 3  . Years of education: Not on file  . Highest education level: Not on file  Social Needs  . Financial resource strain: Not on file  . Food insecurity - worry: Not on file  . Food insecurity - inability: Not on file  . Transportation needs - medical: Not on file  . Transportation needs - non-medical: Not on file  Occupational History  . Occupation: Retired    Comment: [employer]   Tobacco Use  . Smoking status: Never Smoker  . Smokeless tobacco: Never Used  Substance and Sexual Activity  . Alcohol use: Yes    Comment: 01/01/2015 "mixed drink q 2 months or so"  . Drug use: No  . Sexual activity: Not Currently  Other Topics Concern  . Not on file  Social History Narrative  . Not on file    Family Status  Relation Name Status  . Father  Deceased       COPD, esophageal cancer, heart disease  . Brother  Alive       aneurysm  . Mother  Deceased       PD  . Daughter  (Not Specified)    Review of Systems   A complete 10 system ROS was obtained and was negative apart from what is mentioned.   Objective:   VITALS:   Vitals:   10/21/17 0817  BP: 120/60  Pulse: 74  SpO2: 98%  Weight: 140 lb (63.5 kg)  Height: 5\' 2"  (1.575 m)   Gen:  Appears stated age and in NAD. HEENT:  Normocephalic, atraumatic. The mucous membranes are moist. The superficial temporal arteries are without ropiness or tenderness. Cardiovascular: Regular rate and rhythm. Lungs: Clear to auscultation bilaterally. Neck: There are no carotid bruits noted bilaterally.  NEUROLOGICAL:  Orientation:  The patient is alert and oriented x 3.  Recent and remote memory are intact.  Attention span and concentration  are normal.  Able to name objects and repeat without trouble.  Fund of knowledge is appropriate Cranial nerves: There is good facial symmetry. The pupils are equal round and reactive to light bilaterally. Fundoscopic exam reveals clear disc margins bilaterally. Extraocular muscles are intact and visual fields are full to confrontational testing. Speech is fluent and clear. Soft palate rises symmetrically and there is no tongue deviation. Hearing is intact to conversational tone. Tone: Tone is good throughout. Sensation: Sensation is intact to light touch and pinprick throughout (facial, trunk, extremities). Vibration is intact at the bilateral big toe but slightly decreased. There is no extinction with double simultaneous stimulation. There is no sensory dermatomal level identified. Coordination:  The patient has no Dysdiadochokinesia.  Initial finger-nose-finger with the eyes closed demonstrates right index finger to left cheek.  It is directed at the exact same point on the left cheek each time, but after several attempts she does do finger-nose-finger on the right.  Finger-nose-finger  on the left is normal. Motor: Strength is 5/5 in the bilateral upper and lower extremities.  Shoulder shrug is equal bilaterally.  There is no pronator drift.  There are no fasciculations noted. DTR's: Deep tendon reflexes are 2/4 at the bilateral biceps, triceps, brachioradialis, 2+ at the bilateral patella and 1/4 at the bilateral achilles.  Plantar responses are downgoing bilaterally. Gait and Station: The patient is able to ambulate without difficulty. The patient is able to heel toe walk without any difficulty. The patient has mild difficulty in relating a tandem fashion.  The patient is able to stand in the Romberg position.   MOVEMENT EXAM: Tremor:  There is no tremor noted.   Lab Results  Component Value Date   VITAMINB12 312 02/20/2017   Lab Results  Component Value Date   HGBA1C 6.3 (H) 01/03/2015       Assessment/Plan:   1.  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  She has had more problems lately.  Will slightly increase zonegran to 100 mg in the AM, 200 mg at night.  Risks, benefits, side effects and alternative therapies were discussed.  The opportunity to ask questions was given and they were answered to the best of my ability.  The patient expressed understanding and willingness to follow the outlined treatment protocols.  -limit alcohol to one drink per week  -no driving for 6 months from 10/12/17 2.  Anxiety  -This really has been a issue for her.  [facility] gave her Ativan, which was helpful, but transitioned her appropriately to BuSpar.  She did not think BuSpar was as helpful.  It has been d/c by her PCP.  PCP now treating. 3.  Memory loss  -did neuropsych testing on 06/29/16 and no evidence of dementia, but there was evidence of mixed anxiety and depression.   -talked about ways to get out of the house even though can't drive.   4.  B12 deficiency  -on B12 supplement now.  States that she is on 544mcg.  Told her to make it 1026mcg.  She started this after checked in may.   5.  Much greater than 50% of this visit was spent in counseling and coordinating care.  Total face to face time:  25 min.  This did not include the 40 min of record review which was detailed above, which was non face to face time.

## 2017-10-21 ENCOUNTER — Encounter: Payer: Self-pay | Admitting: Neurology

## 2017-10-21 ENCOUNTER — Ambulatory Visit: Payer: Medicare Other | Admitting: Neurology

## 2017-10-21 VITALS — BP 120/60 | HR 74 | Ht 62.0 in | Wt 140.0 lb

## 2017-10-21 DIAGNOSIS — G40109 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, not intractable, without status epilepticus: Secondary | ICD-10-CM

## 2017-10-21 DIAGNOSIS — E538 Deficiency of other specified B group vitamins: Secondary | ICD-10-CM | POA: Diagnosis not present

## 2017-10-21 DIAGNOSIS — R350 Frequency of micturition: Secondary | ICD-10-CM | POA: Diagnosis not present

## 2017-10-21 DIAGNOSIS — E039 Hypothyroidism, unspecified: Secondary | ICD-10-CM | POA: Diagnosis not present

## 2017-10-21 MED ORDER — ZONISAMIDE 100 MG PO CAPS: ORAL_CAPSULE | ORAL | 1 refills | Status: DC

## 2017-10-21 NOTE — Addendum Note (Signed)
Addended byAnnamaria Helling on: 10/21/2017 08:53 AM   Modules accepted: Orders

## 2017-10-21 NOTE — Patient Instructions (Signed)
1. No driving for 6 months from 10/12/2017.  2. Increase Zonegran 100 mg to 1 tablet in the morning, 2 tablets at night. Prescription sent to your pharmacy.   3. Make sure you are taking Vitamin b12 - 1000 mcg daily.   4. You can stop Ativan.   5. Limit alcohol to one drink a week.

## 2017-10-27 ENCOUNTER — Telehealth: Payer: Self-pay | Admitting: Neurology

## 2017-10-27 NOTE — Telephone Encounter (Signed)
Those spells don't sounds like her seizure spells.  Those sound like anxiety and panic attacks.  Agree with f/u pcp

## 2017-10-27 NOTE — Telephone Encounter (Signed)
Spoke with patient and she states for the past week or so she has had a couple episodes a day of feeling very nervous and "trembly". For example: she states she had laid down a piece of paper today and couldn't remember where she put it and she just burst into tears. She could eventually calm down and find it, but she states having a lot of episodes like this.   I did read her last office note and it states that PCP was to manage anxiety. She has not seen him for this since last visit and d/c ativan.   She will call to make an appt with PCP to address. I let her know I would make you aware and call her back if you had any other suggestions.   Dr. Carles Collet Juluis Rainier.

## 2017-10-27 NOTE — Telephone Encounter (Signed)
Pt left a voicemail message saying she has been feeling really nervous for about a week and would like a call back to discuss

## 2017-10-28 ENCOUNTER — Telehealth: Payer: Self-pay | Admitting: Neurology

## 2017-10-28 NOTE — Telephone Encounter (Signed)
Spoke with patient and she was made aware.

## 2017-10-28 NOTE — Telephone Encounter (Signed)
Received after hours note stating, "Caller Jazlyn Tippens was instructed to contact primary care Dr. Laurann Montana for Ativan and Dr. Quintella Baton needs Dr. Doristine Devoid okay since she is the doctor who told patient not to take it."   I tried to call patient to discuss and let her know that we were not advising patient to go back on Ativan, but to see PCP to discuss management of anxiety. No answer and no voicemail available. Will try again later.

## 2017-10-29 DIAGNOSIS — F3341 Major depressive disorder, recurrent, in partial remission: Secondary | ICD-10-CM | POA: Diagnosis not present

## 2017-11-10 ENCOUNTER — Telehealth: Payer: Self-pay | Admitting: Neurology

## 2017-11-10 NOTE — Telephone Encounter (Signed)
Received call from patients PCP that patient reported that she got anxious on 100mg  of zonegran in AM and 200 at night.  He had her stop the AM dosage and she felt some better.  We both agreed that anxiety plays a big role here.  He would like to give her small dose of xanax or ativan for prn use in the AM and told him that was okay by me.

## 2017-11-12 DIAGNOSIS — M25561 Pain in right knee: Secondary | ICD-10-CM | POA: Diagnosis not present

## 2017-11-12 DIAGNOSIS — M7061 Trochanteric bursitis, right hip: Secondary | ICD-10-CM | POA: Diagnosis not present

## 2017-11-12 DIAGNOSIS — M25551 Pain in right hip: Secondary | ICD-10-CM | POA: Diagnosis not present

## 2017-11-12 DIAGNOSIS — M25559 Pain in unspecified hip: Secondary | ICD-10-CM

## 2017-11-12 HISTORY — DX: Pain in unspecified hip: M25.559

## 2017-11-16 ENCOUNTER — Ambulatory Visit: Payer: Medicare Other | Admitting: Neurology

## 2017-11-23 ENCOUNTER — Other Ambulatory Visit: Payer: Self-pay | Admitting: Neurology

## 2017-11-23 MED ORDER — ZONISAMIDE 100 MG PO CAPS: 200.0000 mg | ORAL_CAPSULE | Freq: Every day | ORAL | 1 refills | Status: DC

## 2017-11-29 DIAGNOSIS — F331 Major depressive disorder, recurrent, moderate: Secondary | ICD-10-CM | POA: Diagnosis not present

## 2017-11-29 DIAGNOSIS — G40209 Localization-related (focal) (partial) symptomatic epilepsy and epileptic syndromes with complex partial seizures, not intractable, without status epilepticus: Secondary | ICD-10-CM | POA: Diagnosis not present

## 2017-11-29 DIAGNOSIS — K210 Gastro-esophageal reflux disease with esophagitis: Secondary | ICD-10-CM | POA: Diagnosis not present

## 2017-11-29 DIAGNOSIS — J45909 Unspecified asthma, uncomplicated: Secondary | ICD-10-CM | POA: Diagnosis not present

## 2017-12-17 ENCOUNTER — Telehealth: Payer: Self-pay | Admitting: Internal Medicine

## 2017-12-17 NOTE — Telephone Encounter (Signed)
ATC pt, no answer. Left message for pt to call back.  

## 2017-12-20 NOTE — Telephone Encounter (Signed)
lmtcb X2 for pt. I do not see where our office has called pt recently.

## 2017-12-21 NOTE — Telephone Encounter (Signed)
Called and spoke with patient, she stated she needed the correct address to our office. Nothing further needed.

## 2017-12-28 DIAGNOSIS — F419 Anxiety disorder, unspecified: Secondary | ICD-10-CM | POA: Diagnosis not present

## 2017-12-30 ENCOUNTER — Telehealth: Payer: Self-pay | Admitting: Internal Medicine

## 2017-12-30 DIAGNOSIS — E7800 Pure hypercholesterolemia, unspecified: Secondary | ICD-10-CM

## 2017-12-30 DIAGNOSIS — E039 Hypothyroidism, unspecified: Principal | ICD-10-CM

## 2017-12-30 NOTE — Telephone Encounter (Signed)
Labs ordered.

## 2017-12-30 NOTE — Telephone Encounter (Signed)
Patient scheduled a follow-up thyroid/cholesterol and would like to do labs prior.  Please place orders in EMR and call patient to schedule a lab-only appointment.    Tammy CossJudy Pickett-Lopez

## 2017-12-30 NOTE — Telephone Encounter (Signed)
I have attempted to contact this patient by phone with the following results: no answer, I will continue to try later.    Tammy [name]

## 2017-12-31 NOTE — Telephone Encounter (Signed)
Spoke to patient, ID x3, fasting lab appointment was scheduled. Done

## 2018-01-07 ENCOUNTER — Ambulatory Visit: Payer: Medicare Other | Admitting: Internal Medicine

## 2018-01-11 ENCOUNTER — Encounter: Payer: Self-pay | Admitting: Internal Medicine

## 2018-01-11 ENCOUNTER — Ambulatory Visit: Payer: Medicare Other | Admitting: Internal Medicine

## 2018-01-11 ENCOUNTER — Ambulatory Visit: Payer: Medicare Other | Attending: Internal Medicine

## 2018-01-11 VITALS — BP 108/70 | HR 71 | Ht 62.0 in | Wt 142.2 lb

## 2018-01-11 DIAGNOSIS — E039 Hypothyroidism, unspecified: Principal | ICD-10-CM | POA: Insufficient documentation

## 2018-01-11 DIAGNOSIS — E7800 Pure hypercholesterolemia, unspecified: Secondary | ICD-10-CM | POA: Insufficient documentation

## 2018-01-11 DIAGNOSIS — J45991 Cough variant asthma: Secondary | ICD-10-CM

## 2018-01-11 LAB — CBC WITH DIFFERENTIAL
BASOPHILS % AUTO: 0.7 %
BASOPHILS ABS AUTO: 0.1 10*3/uL (ref 0.0–0.2)
EOSINOPHIL % AUTO: 2.3 %
EOSINOPHIL ABS AUTO: 0.2 10*3/uL (ref 0.0–0.5)
HEMATOCRIT: 43.1 % (ref 36.0–46.0)
HEMOGLOBIN: 14.7 g/dL (ref 12.0–16.0)
LYMPHOCYTE ABS AUTO: 2 10*3/uL (ref 1.0–4.8)
LYMPHOCYTES % AUTO: 24.5 %
MCH: 31.7 pg (ref 27.0–33.0)
MCHC: 34.1 % (ref 32.0–36.0)
MCV: 93.1 fL (ref 80.0–100.0)
MONOCYTES % AUTO: 7.7 %
MPV: 8.5 fL (ref 6.8–10.0)
Monocytes Abs Auto: 0.6 10*3/uL (ref 0.1–0.8)
NEUTROPHIL ABS AUTO: 5.3 10*3/uL (ref 1.8–7.7)
NEUTROPHILS % AUTO: 64.8 %
PLATELET COUNT: 319 10*3/uL (ref 130–400)
RDW: 14.4 % (ref 0.0–14.7)
Red Blood Cell Count: 4.63 10*6/uL (ref 4.00–5.20)
White Blood Cell Count: 8.1 10*3/uL (ref 4.5–11.0)

## 2018-01-11 LAB — COMPREHENSIVE METABOLIC PANEL
ALANINE TRANSFERASE (ALT): 20 U/L (ref 5–54)
ALBUMIN: 3.8 g/dL (ref 3.2–4.6)
ALKALINE PHOSPHATASE (ALP): 75 U/L (ref 35–115)
ASPARTATE TRANSAMINASE (AST): 24 U/L (ref 15–43)
BILIRUBIN TOTAL: 0.9 mg/dL (ref 0.3–1.3)
CALCIUM: 8.9 mg/dL (ref 8.6–10.5)
CARBON DIOXIDE TOTAL: 25 mmol/L (ref 24–32)
CHLORIDE: 103 mmol/L (ref 95–110)
Creatinine Serum: 0.82 mg/dL (ref 0.44–1.27)
GLUCOSE: 99 mg/dL (ref 70–99)
POTASSIUM: 4.3 mmol/L (ref 3.3–5.0)
PROTEIN: 6.6 g/dL (ref 6.3–8.3)
SODIUM: 139 mmol/L (ref 135–145)
UREA NITROGEN, BLOOD (BUN): 17 mg/dL (ref 8–22)

## 2018-01-11 LAB — LIPID PANEL
CHOLESTEROL: 196 mg/dL (ref 0–200)
HDL CHOLESTEROL: 63 mg/dL (ref >=35)
LDL CHOLESTEROL CALCULATION: 114 mg/dL (ref <130)
NON-HDL CHOLESTEROL: 133 mg/dL (ref <=150)
TOTAL CHOLESTEROL:HDL RATIO: 3.1 (ref <4.0)
TRIGLYCERIDE: 93 mg/dL (ref 35–160)

## 2018-01-11 LAB — THYROID STIMULATING HORMONE: THYROID STIMULATING HORMONE: 14.18 u[IU]/mL — AB (ref 0.35–3.30)

## 2018-01-11 MED ORDER — OMEPRAZOLE 40 MG PO CPDR: 40.0000 mg | DELAYED_RELEASE_CAPSULE | Freq: Every day | ORAL | 2 refills | Status: DC

## 2018-01-11 NOTE — Patient Instructions (Addendum)
Plan A = Automatic = symbicort 80 Take 2 puffs first thing in am and then another 2 puffs about 12 hours later.   Work on inhaler technique:  relax and gently blow all the way out then take a nice smooth deep breath back in, triggering the inhaler at same time you start breathing in.  Hold for up to 5 seconds if you can. Blow out thru nose. Rinse and gargle with water when done      Plan B = Backup Only use your albuterol Ventolin)  as a rescue medication to be used if you can't catch your breath by resting or doing a relaxed purse lip breathing pattern.  - The less you use it, the better it will work when you need it. - Ok to use the inhaler up to 2 puffs  every 4 hours if you must but call for appointment if use goes up over your usual need - Don't leave home without it !!  (think of it like the spare tire for your car)   Plan C = Crisis - only use your albuterol nebulizer if you first try Plan B and it fails to help > ok to use the nebulizer up to every 4 hours but if start needing it regularly call for immediate appointment   omepraprazole 40 mg Take 30-60 min before first meal of the day and Pepcid 20 mg at bedtime  GERD (REFLUX)  is an extremely common cause of respiratory symptoms just like yours , many times with no obvious heartburn at all.    It can be treated with medication, but also with lifestyle changes including elevation of the head of your bed (ideally with 6 inch  bed blocks),  Smoking cessation, avoidance of late meals, excessive alcohol, and avoid fatty foods, chocolate, peppermint, colas, red wine, and acidic juices such as orange juice.  NO MINT OR MENTHOL PRODUCTS SO NO COUGH DROPS  USE SUGARLESS CANDY INSTEAD (Jolley ranchers or Stover's or Life Savers) or even ice chips will also do - the key is to swallow to prevent all throat clearing. NO OIL BASED VITAMINS - use powdered substitutes.     Please schedule a follow up office visit in 6 weeks, call sooner if needed  with all medications /inhalers/ solutions in hand so we can verify exactly what you are taking. This includes all medications from all doctors and over the counters   pft's on return

## 2018-01-11 NOTE — Progress Notes (Signed)
Subjective:    Patient ID: Suzanne Galloway, female    DOB: [birth year]   MRN: [id number]   Brief patient profile:  50 yowf never smoker with dx of recurrent bronchitis yearly starting around 2005 typically around November and better with zpak/predisone referred 06/16/2013 by Dr Lavone Orn for sob > cough onset March 2014   HPI 06/16/2013 1st [facility] office visit/ Wert cc sob > cough  X 5 months better with inhalers then flare again when they wear off, best one is albuterol and no better on symbicort so changed to dulera x sev days prior to OV ( But hfa nearly 0% at initial ov ) and not effective yet. Sob x > slow adls  rec Change Symbicort  To 80 strength and  Take 2 puffs first thing in am and then another 2 puffs about 12 hours later.  Hold the dulera for now Only use your albuterol (ventolin) as a rescue medication  Continue prilosec Take 30-60 min before first meal of the day and pepcid ac mg at bedtime  GERD  Diet  Work on inhaler technique    06/27/2013 f/u ov/Wert    Chief Complaint  Patient presents with  . Follow-up    Breathing and cough have improved some. She is having some cough this am- non prod and she relates to the damp weather. She has not had to use rescue inhaler since her last visit.   much better at hfa but still on pred ? 40  ? Daily.  rec Continue symbicort 80  Take 2 puffs first thing in am and then another 2 puffs about 12 hours later and keep working on perfecting your inhaler technique Only use your albuterol as a rescue medication to be used if you can't catch your breath by resting or doing a relaxed purse lip breathing pattern. The less you use it, the better it will work when you need it.  Goal is less than twice weekly Taper off the prednisone as per Dr Laurann Montana and return if flare off it.     02/18/2015 f/u ov/Wert re: much better on dulera 100 2bid no need for cough meds or albuterol  Chief Complaint  Patient presents with  . Follow-up    Pt  states that her breathing is back at her normal baseline and her cough has improved some. She is using albuterol inhaler once per wk on average.    all smiles,  Not limited by breathing from desired activities  / no sign cough   rec Continue dulera 100 Take 2 puffs first thing in am and then another 2 puffs about 12 hours later.  Only use your albuterol as a rescue medication   Ok to cut your reflux medications as long as not coughing to what you were taking before as long as doing well. F/u as needed    12/06/2015  f/u ov/Wert re: asthma symbicort 160 maint p "sz" on dulera/ did not follow contingency recs as above  Chief Complaint  Patient presents with  . Follow-up    hacky cough and raspy voice.     onset x sev weeks prior to OV  Indolent/ gradually worse cough assoc with overt HB symptoms 24/7  rec symbicort 80 Take 2 puffs first thing in am and then another 2 puffs about 12 hours later.  Only use your albuterol as a rescue medication  omepraprazole 40 mg Take 30-60 min before first meal of the day and Pepcid 20 mg  at bedtime Gaviscon liquid as needed or Maalox plus  GERD diet     01/11/2018  f/u ov/Wert re: re establish re asthma  Chief Complaint  Patient presents with  . Follow-up    Breathing is overall doing well. She is using her albuterol inhaler and neb both once daily on average.   Dyspnea:  Not limited by breathing from desired activities   Cough: no Sleep: ok SABA use:  Misunderstood and using saba bid as maint  Some nasal symptoms better with zyrtec    No obvious day to day or daytime variability or assoc excess/ purulent sputum or mucus plugs or hemoptysis or cp or chest tightness, subjective wheeze or overt sinus or hb symptoms. No unusual exposure hx or h/o childhood pna/ asthma or knowledge of premature birth.  Sleeping ok flat without nocturnal  or early am exacerbation  of respiratory  c/o's or need for noct saba. Also denies any obvious fluctuation of  symptoms with weather or environmental changes or other aggravating or alleviating factors except as outlined above   Current Allergies, Complete Past Medical History, Past Surgical History, Family History, and Social History were reviewed in [employer] record.  ROS  The following are not active complaints unless bolded Hoarseness, sore throat, dysphagia, dental problems, itching, sneezing,  nasal congestion or discharge of excess mucus or purulent secretions, ear ache,   fever, chills, sweats, unintended wt loss or wt gain, classically pleuritic or exertional cp,  orthopnea pnd or leg swelling, presyncope, palpitations, abdominal pain, anorexia, nausea, vomiting, diarrhea  or change in bowel habits or change in bladder habits, change in stools or change in urine, dysuria, hematuria,  rash, arthralgias, visual complaints, headache, numbness, weakness or ataxia or problems with walking or coordination,  change in mood/affect or memory.        Current Meds  Medication Sig  . albuterol (PROVENTIL HFA;VENTOLIN HFA) 108 (90 BASE) MCG/ACT inhaler Inhale 2 puffs into the lungs every 4 (four) hours as needed for wheezing.  Marland Kitchen albuterol (PROVENTIL) (2.5 MG/3ML) 0.083% nebulizer solution Take 2.5 mg by nebulization daily. Per PCP Dr Laurann Montana  . budesonide-formoterol (SYMBICORT) 80-4.5 MCG/ACT inhaler Take 2 puffs first thing in am and then another 2 puffs about 12 hours later. (Patient taking differently: Inhale 2 puffs into the lungs every 12 (twelve) hours. )  . Calcium Carbonate-Vitamin D (CALCIUM 600+D) 600-400 MG-UNIT tablet Take 1 tablet by mouth daily.  . cetirizine (ZYRTEC) 10 MG tablet Take 10 mg by mouth daily as needed for allergies.   Marland Kitchen escitalopram (LEXAPRO) 20 MG tablet Take 20 mg by mouth daily.  . famotidine (PEPCID) 20 MG tablet One at bedtime (Patient taking differently: Take 20 mg by mouth at bedtime as needed for heartburn or indigestion. )  . levothyroxine  (SYNTHROID, LEVOTHROID) 100 MCG tablet Take 1 tablet (100 mcg total) by mouth daily before breakfast.  . LORazepam (ATIVAN) 0.5 MG tablet Take 1 tablet by mouth 2 (two) times daily as needed.  . metoprolol succinate (TOPROL-XL) 50 MG 24 hr tablet Take 50 mg by mouth daily. Take with or immediately following a meal.  . vitamin B-12 (CYANOCOBALAMIN) 1000 MCG tablet Take 1,000 mcg by mouth daily.  Marland Kitchen zonisamide (ZONEGRAN) 100 MG capsule Take 2 capsules (200 mg total) by mouth at bedtime.                    Objective:   Physical Exam    Pleasant amb wf nad  01/11/2018       142   12/06/15 147 lb 6.4 oz (66.86 kg)  08/05/15 147 lb (66.679 kg)  02/18/15 150 lb 12.8 oz (68.402 kg)      Vital signs reviewed - Note on arrival 02 sats  98% on RA        HEENT: nl  , turbinates bilaterally, and oropharynx. Nl external ear canals without cough reflex - full upper denture/ partial lower    NECK :  without JVD/Nodes/TM/ nl carotid upstrokes bilaterally   LUNGS: no acc muscle use,  Nl contour chest which is clear to A and P bilaterally without cough on insp or exp maneuvers   CV:  RRR  no s3 or murmur or increase in P2, and no edema   ABD:  soft and nontender with nl inspiratory excursion in the supine position. No bruits or organomegaly appreciated, bowel sounds nl  MS:  Nl gait/ ext warm without deformities, calf tenderness, cyanosis or clubbing No obvious joint restrictions   SKIN: warm and dry without lesions    NEURO:  alert, approp, nl sensorium with  no motor or cerebellar deficits apparent.         Assessment & Plan:

## 2018-01-16 ENCOUNTER — Encounter: Payer: Self-pay | Admitting: Internal Medicine

## 2018-01-16 NOTE — Assessment & Plan Note (Addendum)
-   spirometry 06/27/2013   FEV1 1.92 (96%) ratio 72 and FEF 25-75 68% - Allergy eval by Harold Hedge 11/16/13  NO nl, neg allergy studies, rec topical nasal rx plus allegra - 01/24/2015  Trial of dulera 100 2bid > improved 02/18/2015    - 01/11/2018  After extensive coaching inhaler device  effectiveness =    75% from a baseline of 50%    Consistently breaking rule of 2's suggesting airways dz difficult to control DDX of  difficult airways management almost all start with A and  include Adherence, Ace Inhibitors, Acid Reflux, Active Sinus Disease, Alpha 1 Antitripsin deficiency, Anxiety masquerading as Airways dz,  ABPA,  Allergy(esp in young), Aspiration (esp in elderly), Adverse effects of meds,  Active smokers, A bunch of PE's (a small clot burden can't cause this syndrome unless there is already severe underlying pulm or vascular dz with poor reserve) plus two Bs  = Bronchiectasis and Beta blocker use..and one C= CHF   In this case Adherence is the biggest issue and starts with  inability to use HFA effectively and also  understand that SABA treats the symptoms but doesn't get to the underlying problem (inflammation).  I used  the analogy of putting steroid cream on a rash to help explain the meaning of topical therapy and the need to get the drug to the target tissue.   - see hfa teaching - return with all meds in hand using a trust but verify approach to confirm accurate Medication  Reconciliation The principal here is that until we are certain that the  patients are doing what we've asked, it makes no sense to ask them to do more.   ? Acid (or non-acid) GERD > always difficult to exclude as up to 75% of pts in some series report no assoc GI/ Heartburn symptoms> rec max (24h)  acid suppression and diet restrictions/ reviewed and instructions given in writing.   ? Anxiety > usually at the bottom of this list of usual suspects but should be   higher on this pt's based on H and P and note already on  psychotropics and may interfere with adherence and also interpretation of response or lack thereof to symptom management which can be quite subjective    I had an extended discussion with the patient reviewing all relevant studies completed to date and  lasting 15 to 20 minutes of a 25 minute visit  To re-establish re chronic asthma management  Each maintenance medication was reviewed in detail including most importantly the difference between maintenance and prns and under what circumstances the prns are to be triggered using an action plan format that is not reflected in the computer generated alphabetically organized AVS.    Please see AVS for specific instructions unique to this visit that I personally wrote and verbalized to the the pt in detail and then reviewed with pt  by my nurse highlighting any  changes in therapy recommended at today's visit to their plan of care.

## 2018-01-18 ENCOUNTER — Encounter: Payer: Self-pay | Admitting: Internal Medicine

## 2018-01-18 ENCOUNTER — Ambulatory Visit: Payer: Medicare Other | Admitting: Internal Medicine

## 2018-01-18 VITALS — BP 134/86 | HR 69 | Ht 66.0 in | Wt 182.3 lb

## 2018-01-18 DIAGNOSIS — E039 Hypothyroidism, unspecified: Secondary | ICD-10-CM

## 2018-01-18 DIAGNOSIS — E7800 Pure hypercholesterolemia, unspecified: Principal | ICD-10-CM

## 2018-01-18 DIAGNOSIS — R05 Cough: Secondary | ICD-10-CM | POA: Diagnosis not present

## 2018-01-18 DIAGNOSIS — J00 Acute nasopharyngitis [common cold]: Secondary | ICD-10-CM | POA: Diagnosis not present

## 2018-01-18 DIAGNOSIS — M791 Myalgia, unspecified site: Secondary | ICD-10-CM | POA: Diagnosis not present

## 2018-01-18 MED ORDER — LEVOTHYROXINE 125 MCG TABLET
125.0000 ug | ORAL_TABLET | Freq: Every day | ORAL | 2 refills | Status: DC
Start: 2018-01-18 — End: 2018-03-18

## 2018-01-18 NOTE — Patient Instructions (Signed)
   Increase levothyroxine to 125 mcg daily

## 2018-01-18 NOTE — Nursing Note (Signed)
Vital signs taken, allergies verified, screened for pain, pharmacy verified, and verified immunization status. have asked the patient if they have received any medical treatment or consultation outside of the [facility].  The patient has indicated no to receiving consults or services outside the [facility] system.  If yes, we have requested information to ascertain these results.   Patient is here with self  Tammy Kempe, MA

## 2018-01-18 NOTE — Progress Notes (Signed)
Chief Complaint: Patient presents today with follow-up on her blood test results    Hyperlipidemia,   management discussed with patient, she has been working on low fat diet, she has been doing regular exercise. Patient verbalised understanding about the significance for regular exercise and low fat diet in management of this problem.  She is walking 30 minutes a day  Lab Results   Lab Name Value Date/Time    CHOL 196 01/11/2018 07:53 AM    CHOL 188 05/09/2014 09:44 AM    LDLC 114 01/11/2018 07:53 AM    LDLC 107 05/09/2014 09:44 AM    HDL 63 01/11/2018 07:53 AM    HDL 59 05/09/2014 09:44 AM    TRIG 93 01/11/2018 07:53 AM    TRIG 108 05/09/2014 09:44 AM       No symptoms of hypo or hyperthyroidism: no decreased or increased weight, no feeling cold/chilly or excessively warm, no diarrhea or constipation, no undue sweatiness, anxiety or palpitations..  She has noticed tiredness and fatigue  Lab Results   Lab Name Value Date/Time    TSH 14.18 (H) 01/11/2018 07:53 AM    TSH 1.89 05/09/2014 09:44 AM    FT4 1.27 06/17/2009 08:16 AM        Ros  complete review of symptons was done all other systems are negative except as otherwise stated in HPI    Vs  BP 134/86 (SITE: right arm, Orthostatic Position: sitting, Cuff Size: regular)   Pulse 69   Ht 1.676 m (5\' 6" )   Wt 82.7 kg (182 lb 5.1 oz)   SpO2 96%   BMI 29.43 kg/m   Physical exam -  General Appearance: healthy, alert, no distress, pleasant affect, cooperative.  Eyes:  conjunctivae and corneas clear. PERRL, EOM's intact. sclerae normal.  Ears:  external inspection of ears show no abnormality.  Lungs: no chest deformities noted .lungs clear to auscultation.  Heart:  normal rate and regular rhythm, no murmurs,  Extremities:  no cyanosis, clubbing, or edema.      ASSESMENT AND PLAN     (E78.00) Pure hypercholesterolemia  (primary encounter diagnosis)  Comment: Improved cholesterol  Plan: THYROID STIMULATING HORMONE, LIPID PANEL,         HEPATIC FUNCTION PANEL             (E03.9) Acquired hypothyroidism  Comment: TSH is elevated  Plan: Increase levothyroxine to 125 mcg/day and repeat TSH in 8 weeks     I did review patient's past medical and family/social history, no changes noted.  Barriers to Learning assessed: none. Patient verbalizes understanding of teaching and instructions.  Zettie CooleyAPEET Sofya Moustafa MD      Electronically Signed By:  Zettie CooleyApeet Chancy Smigiel, MD  Physician associate   [facility] [place]  [phone]

## 2018-01-28 ENCOUNTER — Encounter: Payer: Self-pay | Admitting: *Deleted

## 2018-02-01 ENCOUNTER — Encounter: Payer: Self-pay | Admitting: Diagnostic Neuroimaging

## 2018-02-01 ENCOUNTER — Ambulatory Visit: Payer: Medicare Other | Admitting: Diagnostic Neuroimaging

## 2018-02-01 VITALS — BP 119/59 | HR 69 | Ht 62.0 in | Wt 140.8 lb

## 2018-02-01 DIAGNOSIS — G40109 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, not intractable, without status epilepticus: Secondary | ICD-10-CM

## 2018-02-01 MED ORDER — ZONISAMIDE 100 MG PO CAPS: 200.0000 mg | ORAL_CAPSULE | Freq: Every day | ORAL | 4 refills | Status: DC

## 2018-02-01 NOTE — Patient Instructions (Signed)
  LOCALIZATION EPILEPSY (last event ~Dec 2018) - continue zonisamide 200mg  at bedtime - no driving until seizure free x 6 months  ANXIETY DISORDER + DEPRESSION - continue lexapro + lorazepam - follow up with psychiatry / psychology

## 2018-02-01 NOTE — Progress Notes (Signed)
[facility]  PATIENT: Suzanne Galloway DOB: [birth year]  REFERRING CLINICIAN: Electa Sniff HISTORY FROM: patient  REASON FOR VISIT: new consult    HISTORICAL  CHIEF COMPLAINT:  Chief Complaint  Patient presents with  . Focal seizures    rm 6, New Pt, dgtr- [name], "last seizure 10/13/17, want to transfer care"    HISTORY OF PRESENT ILLNESS:   77 year old female here for evaluation of seizures.  Feb 10, 2012 was a stressful year for patient.  Patient was taking care of her husband who ultimately passed away.  Then in 2013/02/09  she began to have intermittent episodes of dj vu, anxiety, hot sensation in her body, a liquid sensation going down her chest and abdomen.  Episodes can last minutes at a time.  These occurred every few weeks.  Over time the increased in frequency.  These were attributed to anxiety and patient was treated with Lexapro, Wellbutrin and Ativan.  In 10-Feb-2015 patient was evaluated by neurologist for tremor.  She also reported episodes of dj vu and therefore EEG was ordered.  No epileptiform discharges were found.  This is followed up with ambulatory EEG which was also normal.  May 2018 patient presented to the hospital with retrograde memory loss, patient thinking it was 2015/02/10, no recent memory of a beach trip recently, and other confusion.  Hospital EEG showed occasional sharp wave discharges in the right posterior frontal region.  Patient was then referred to [facility] for video EEG monitoring.  Occasional right temporal sharp wave activity was noted.  Patient had 2 electrographic seizures.  Patient was started on Lamictal but then this was discontinued due to lack of effectiveness and possible side effect.  This was changed to oxcarbazepine but this caused paranoia and confusion.  Patient was then started on zonisamide.  Patient developed some tremor which was initially thought to be anxiety related, but then possibly related to zonisamide, and therefore dose was  slightly reduced.  Last event of possible seizure occurred 10/13/17, with memory lapse, confusion, dj vu sensation, anxiety sensation.  Patient currently on zonisamide 200 mg at bedtime for seizure prevention.  No significant tremors.  She is also on Lexapro 20 mg/day for anxiety and depression.  She is on lorazepam 0.5 mg twice a day for anxiety.  Patient still feels quite anxious and depressed at times, related to being stuck at home.  However when she is able to get out of the house she feels like going back home.  She is working with a Teacher, music and psychologist now.  Patient then requested a second opinion with our office.    REVIEW OF SYSTEMS: Full 14 system review of systems performed and negative with exception of: Weight loss fatigue memory loss depression anxiety change in appetite diarrhea shortness of breath feeling hot feeling cold hearing loss.  ALLERGIES: Allergies  Allergen Reactions  . Lamictal [Lamotrigine] Other (See Comments)    Increased siezure activity.  . Oxcarbazepine Other (See Comments)    Over sedation  . Atorvastatin   . Buspirone     Increased depression  . Dilaudid [Hydromorphone] Other (See Comments)    Reaction:  Hallucinations   . Dulera [Mometasone Furo-Formoterol Fum] Other (See Comments)    Reaction:  Deja vu seizures   . Lactose Intolerance (Gi) Diarrhea  . Morphine And Related Nausea And Vomiting  . Percocet [Oxycodone-Acetaminophen] Other (See Comments)    Reaction:  Hallucinations   . Pravastatin Other (See Comments)    Reaction:  Muscle pain, "  weird dreams"  . Vesicare [Solifenacin] Other (See Comments)    Reaction:  Constipation     HOME MEDICATIONS: Outpatient Medications Prior to Visit  Medication Sig Dispense Refill  . albuterol (PROVENTIL HFA;VENTOLIN HFA) 108 (90 BASE) MCG/ACT inhaler Inhale 2 puffs into the lungs every 4 (four) hours as needed for wheezing. 6.7 g 3  . albuterol (PROVENTIL) (2.5 MG/3ML) 0.083% nebulizer  solution Take 2.5 mg by nebulization daily. Per PCP Dr Laurann Montana    . budesonide-formoterol (SYMBICORT) 80-4.5 MCG/ACT inhaler Take 2 puffs first thing in am and then another 2 puffs about 12 hours later. (Patient taking differently: Inhale 2 puffs into the lungs every 12 (twelve) hours. ) 1 Inhaler 11  . Calcium Carbonate-Vitamin D (CALCIUM 600+D) 600-400 MG-UNIT tablet Take 1 tablet by mouth daily.    . cetirizine (ZYRTEC) 10 MG tablet Take 10 mg by mouth daily as needed for allergies.     Marland Kitchen escitalopram (LEXAPRO) 20 MG tablet Take 20 mg by mouth daily.  11  . famotidine (PEPCID) 20 MG tablet One at bedtime (Patient taking differently: Take 20 mg by mouth at bedtime as needed for heartburn or indigestion. )    . levothyroxine (SYNTHROID, LEVOTHROID) 100 MCG tablet Take 1 tablet (100 mcg total) by mouth daily before breakfast. 30 tablet 0  . LORazepam (ATIVAN) 0.5 MG tablet Take 1 tablet by mouth 2 (two) times daily as needed.  4  . metoprolol succinate (TOPROL-XL) 50 MG 24 hr tablet Take 50 mg by mouth daily. Take with or immediately following a meal.    . omeprazole (PRILOSEC) 40 MG capsule Take 1 capsule (40 mg total) by mouth daily before breakfast. 30 capsule 2  . vitamin B-12 (CYANOCOBALAMIN) 1000 MCG tablet Take 1,000 mcg by mouth daily.    Marland Kitchen zonisamide (ZONEGRAN) 100 MG capsule Take 2 capsules (200 mg total) by mouth at bedtime. 180 capsule 1   No facility-administered medications prior to visit.     PAST MEDICAL HISTORY: Past Medical History:  Diagnosis Date  . Anxiety   . Arthritis    "hands, back, toes" (01/01/2015)  . Asthma   . Chronic bronchitis ([place])    "get it just about q yr" (01/01/2015)  . Depression   . GERD (gastroesophageal reflux disease)   . High cholesterol   . Hyperlipemia   . Hypertension   . Hypothyroidism   . Mitral valve prolapse   . Seizures [facility])    dx Aug 2018, most recent seizure 10/13/17  . Urinary incontinence     PAST SURGICAL HISTORY: Past  Surgical History:  Procedure Laterality Date  . BACK SURGERY    . CARPAL TUNNEL RELEASE Bilateral   . CYSTECTOMY Right 09/2014   "hand"   . DILATION AND CURETTAGE OF UTERUS    . FINGER SURGERY Bilateral    "scraped arthritis out of thumbs"  . INCONTINENCE SURGERY    . LAPAROSCOPIC CHOLECYSTECTOMY    . LUMBAR LAMINECTOMY  1987; 12/2009   ; Archie Endo 01/16/2010  . TOE SURGERY Left    "took bone out of 2nd toe; took cyst out"  . TUBAL LIGATION    . VAGINAL HYSTERECTOMY  1970    FAMILY HISTORY: Family History  Problem Relation Age of Onset  . COPD Father        smoked  . Heart disease Father   . Esophageal cancer Father        smoked  . Transient ischemic attack Father   . Cancer Father  kidney  . Deep vein thrombosis Brother   . Parkinson's disease Mother   . Cancer Daughter     SOCIAL HISTORY:  Social History   Socioeconomic History  . Marital status: Widowed    Spouse name: Not on file  . Number of children: 3  . Years of education: Not on file  . Highest education level: Not on file  Occupational History  . Occupation: Retired    Comment: [employer] shop  Social Needs  . Financial resource strain: Not on file  . Food insecurity:    Worry: Not on file    Inability: Not on file  . Transportation needs:    Medical: Not on file    Non-medical: Not on file  Tobacco Use  . Smoking status: Never Smoker  . Smokeless tobacco: Never Used  Substance and Sexual Activity  . Alcohol use: Yes    Comment: 01/01/2015 "mixed drink q 2 months or so"  . Drug use: No  . Sexual activity: Not Currently  Lifestyle  . Physical activity:    Days per week: Not on file    Minutes per session: Not on file  . Stress: Not on file  Relationships  . Social connections:    Talks on phone: Not on file    Gets together: Not on file    Attends religious service: Not on file    Active member of club or organization: Not on file    Attends meetings of clubs or  organizations: Not on file    Relationship status: Not on file  . Intimate partner violence:    Fear of current or ex partner: Not on file    Emotionally abused: Not on file    Physically abused: Not on file    Forced sexual activity: Not on file  Other Topics Concern  . Not on file  Social History Narrative   Lives with daughter, [name]   Retired, widowed   Education- high school   Caffeine 2 cups daily     PHYSICAL EXAM   GENERAL EXAM/CONSTITUTIONAL: Vitals:  Vitals:   02/01/18 1437  BP: (!) 119/59  Pulse: 69  Weight: 140 lb 12.8 oz (63.9 kg)  Height: 5\' 2"  (1.575 m)     Body mass index is 25.75 kg/m.  Visual Acuity Screening   Right eye Left eye Both eyes  Without correction: 20/30 20/30   With correction:        Patient is in no distress; well developed, nourished and groomed; neck is supple  CARDIOVASCULAR:  Examination of carotid arteries is normal; no carotid bruits  Regular rate and rhythm, no murmurs  Examination of peripheral vascular system by observation and palpation is normal  EYES:  Ophthalmoscopic exam of optic discs and posterior segments is normal; no papilledema or hemorrhages  MUSCULOSKELETAL:  Gait, strength, tone, movements noted in Neurologic exam below  NEUROLOGIC: MENTAL STATUS:  MMSE - Mini Mental State Exam 03/17/2017 06/16/2016  Not completed: Refused;Unable to complete Unable to complete    awake, alert, oriented to person, place and time  recent and remote memory intact  normal attention and concentration  language fluent, comprehension intact, naming intact,   fund of knowledge appropriate  CRANIAL NERVE:   2nd - no papilledema on fundoscopic exam  2nd, 3rd, 4th, 6th - pupils equal and reactive to light, visual fields full to confrontation, extraocular muscles intact, no nystagmus  5th - facial sensation symmetric  7th - facial strength symmetric  8th - hearing intact  9th - palate elevates symmetrically,  uvula midline  11th - shoulder shrug symmetric  12th - tongue protrusion midline  MOTOR:   normal bulk and tone, full strength in the BUE, BLE  SENSORY:   normal and symmetric to light touch, temperature, vibration  COORDINATION:   finger-nose-finger, fine finger movements normal  REFLEXES:   deep tendon reflexes present and symmetric  GAIT/STATION:   narrow based gait; romberg is negative    DIAGNOSTIC DATA (LABS, IMAGING, TESTING) - I reviewed patient records, labs, notes, testing and imaging myself where available.  Lab Results  Component Value Date   WBC 7.3 02/19/2017   HGB 12.5 02/19/2017   HCT 37.3 02/19/2017   MCV 92.1 02/19/2017   PLT 260 02/19/2017      Component Value Date/Time   NA 138 02/19/2017 0935   K 4.6 02/19/2017 0935   CL 101 02/19/2017 0935   CO2 28 02/19/2017 0935   GLUCOSE 112 (H) 02/19/2017 0935   BUN 14 02/19/2017 0935   CREATININE 0.87 02/19/2017 0935   CREATININE 0.82 12/27/2014 0714   CALCIUM 9.4 02/19/2017 0935   PROT 7.4 02/19/2017 0935   ALBUMIN 4.6 02/19/2017 0935   AST 28 02/19/2017 0935   ALT 21 02/19/2017 0935   ALKPHOS 69 02/19/2017 0935   BILITOT 0.4 02/19/2017 0935   GFRNONAA >60 02/19/2017 0935   GFRAA >60 02/19/2017 0935   Lab Results  Component Value Date   CHOL 174 01/02/2015   HDL 37 (L) 01/02/2015   LDLCALC 105 (H) 01/02/2015   TRIG 158 (H) 01/02/2015   CHOLHDL 4.7 01/02/2015   Lab Results  Component Value Date   HGBA1C 6.3 (H) 01/03/2015   Lab Results  Component Value Date   VITAMINB12 312 02/20/2017   Lab Results  Component Value Date   TSH 10.174 (H) 02/20/2017    02/19/17 MRI brain - Atrophy and small vessel disease.  No acute intracranial findings. - No significant progression from 2016.  08/20/15 24 hour EEG IMPRESSION: This 24-hour ambulatory EEG study is normal.   CLINICAL CORRELATION: A normal EEG does not exclude a clinical diagnosis of epilepsy.  Typical events were not captured.  If further clinical questions remain, inpatient video EEG monitoring may be helpful.  05/10/17 VEEG ([facility] report) Patient admitted. Not on any anti-seizure drugs. Sleep deprived. Had one of her typical spells of deja vu, temperature change, with associated seizure arising right temporal region. Had another more severe event with post-ictal confusion, that unfortunately was not captured on camera, but again arose right temporal. Had right temporal sharps through her whole recording indicating an increased risk of seizures.     ASSESSMENT AND PLAN  77 y.o. year old female here with suspected right temporal lobe seizures, consisting of dj vu sensation, temperature change, some memory lapse and confusion.  Also with comorbid anxiety and depression.   Dx:  1. 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 zonisamide (ZONEGRAN) 100 MG capsule    Sig: Take 2 capsules (200 mg total) by mouth at bedtime.    Dispense:  180 capsule  Refill:  4   Return in about 6 months (around 08/03/2018).    Penni Bombard, MD 1/44/3154, 0:08 PM Certified in Neurology, Neurophysiology and Neuroimaging  [facility] [address], [place], [place] [address] [phone]

## 2018-02-21 ENCOUNTER — Telehealth: Payer: Self-pay | Admitting: Diagnostic Neuroimaging

## 2018-02-21 ENCOUNTER — Ambulatory Visit: Payer: Medicare Other | Admitting: Neurology

## 2018-02-21 MED ORDER — DIVALPROEX SODIUM 250 MG PO DR TAB: 250.0000 mg | DELAYED_RELEASE_TABLET | Freq: Two times a day (BID) | ORAL | 12 refills | Status: DC

## 2018-02-21 NOTE — Telephone Encounter (Signed)
Called pt and advised her that Dr. Leta Baptist has ordered Depakote 250 mg twice daily. She is aware that the prescription was sent to [facility] and it will have a full print out of information with the medication for her review. Pt verbalized understanding. She stated that she hasn't had a "full" seizure but has just had all the symptoms. Pt asked if she needed to stop her other meds. RN advised at this time do not stop them, add Depakote. Pt verbalized understanding.

## 2018-02-21 NOTE — Telephone Encounter (Signed)
I will start patient on depakote 250mg  twice a day.  Meds ordered this encounter  Medications  . divalproex (DEPAKOTE) 250 MG DR tablet    Sig: Take 1 tablet (250 mg total) by mouth 2 (two) times daily.    Dispense:  60 tablet    Refill:  [place], MD 05/19/3886, 19:59 PM Certified in Neurology, Neurophysiology and Neuroimaging  [facility] [address], [place], [place] [address] [phone]

## 2018-02-21 NOTE — Telephone Encounter (Signed)
Yes; depakote is to be added on. -VRP

## 2018-02-21 NOTE — Telephone Encounter (Signed)
Pt states the soonest appt she could get to see psychiatrist is May 13. Pt states deja vu spells are getting deeper and deeper, shakes so bad can't stand up, trembling badly, ativan not helping. Pt said she "I can't go another week" like this. Please call to advise.

## 2018-02-22 ENCOUNTER — Ambulatory Visit (INDEPENDENT_AMBULATORY_CARE_PROVIDER_SITE_OTHER): Payer: Medicare Other | Admitting: Internal Medicine

## 2018-02-22 ENCOUNTER — Ambulatory Visit: Payer: Medicare Other | Admitting: Internal Medicine

## 2018-02-22 ENCOUNTER — Encounter: Payer: Self-pay | Admitting: Internal Medicine

## 2018-02-22 VITALS — BP 118/80 | HR 82 | Ht 63.0 in | Wt 134.8 lb

## 2018-02-22 DIAGNOSIS — J45991 Cough variant asthma: Secondary | ICD-10-CM

## 2018-02-22 LAB — PULMONARY FUNCTION TEST
DL/VA % pred: 80 %
DL/VA: 3.75 ml/min/mmHg/L
DLCO unc % pred: 71 %
DLCO unc: 16.31 ml/min/mmHg
FEF 25-75 Post: 2.58 L/s
FEF 25-75 Pre: 1.73 L/s
FEF2575-%Change-Post: 49 %
FEF2575-%Pred-Post: 166 %
FEF2575-%Pred-Pre: 111 %
FEV1-%Change-Post: 7 %
FEV1-%Pred-Post: 109 %
FEV1-%Pred-Pre: 101 %
FEV1-Post: 2.18 L
FEV1-Pre: 2.02 L
FEV1FVC-%Change-Post: 5 %
FEV1FVC-%Pred-Pre: 104 %
FEV6-%Change-Post: 2 %
FEV6-%Pred-Post: 105 %
FEV6-%Pred-Pre: 102 %
FEV6-Post: 2.65 L
FEV6-Pre: 2.58 L
FEV6FVC-%Pred-Post: 105 %
FEV6FVC-%Pred-Pre: 105 %
FVC-%Change-Post: 2 %
FVC-%Pred-Post: 99 %
FVC-%Pred-Pre: 97 %
FVC-Post: 2.65 L
FVC-Pre: 2.58 L
Post FEV1/FVC ratio: 82 %
Post FEV6/FVC ratio: 100 %
Pre FEV1/FVC ratio: 78 %
Pre FEV6/FVC Ratio: 100 %
RV % pred: 93 %
RV: 2.12 L
TLC % pred: 98 %
TLC: 4.85 L

## 2018-02-22 NOTE — Assessment & Plan Note (Addendum)
-   spirometry 06/27/2013   FEV1 1.92 (96%) ratio 72 and FEF 25-75 68% - Allergy eval by Harold Hedge 11/16/13  NO nl, neg allergy studies, rec topical nasal rx plus allegra - 01/24/2015  Trial of dulera 100 2bid > improved 02/18/2015   - 01/11/2018  After extensive coaching inhaler device  effectiveness =    75% from a baseline of 50%   - PFT's  02/22/2018  wnl off symbicort am of study > change symbicort 80 to  2 q 12 h prn and pulmonary f/u prn    I had an extended discussion with the patient reviewing all relevant studies completed to date and  lasting 15 to 20 minutes of a 25 minute visit on the following ongoing concerns:   1) tremor suggests using too much saba and laba so goal will be to minimize both   2) Based on the study from NEJM  378; 20 p 1865 (2018) in pts with mild asthma it is reasonable to use low dose symbicort eg 80 2bid "prn" flare in this setting but I emphasized this was only shown with symbicort and takes advantage of the rapid onset of action but is not the same as "rescue therapy" but can be stopped once the acute symptoms have resolved and the need for rescue has been minimized (< 2 x weekly)    3) if not controlled on symb 80 up to 2bid needs to return here asap - otherwise f/u can be entirely prn with refill for symb 80 per pcp  And gerd rx as well at least until sort out whether really symbicort dependent as well and if not then on to taper the gerd rx as well    4) Each maintenance medication was reviewed in detail including most importantly the difference between maintenance and as needed and under what circumstances the prns are to be used.  Please see AVS for specific  Instructions which are unique to this visit and I personally typed out  which were reviewed in detail in writing with the patient and a copy provided.

## 2018-02-22 NOTE — Progress Notes (Signed)
Subjective:    Patient ID: Suzanne Galloway, female    DOB: 1941-03-13   MRN: [id number]   Brief patient profile:  79 yowf never smoker with dx of recurrent bronchitis yearly starting around 2005 typically around November and better with zpak/predisone referred 06/16/2013 by Dr Lavone Orn for sob > cough onset March 2014   HPI 06/16/2013 1st [facility] office visit/ Teo Moede cc sob > cough  X 5 months better with inhalers then flare again when they wear off, best one is albuterol and no better on symbicort so changed to dulera x sev days prior to OV ( But hfa nearly 0% at initial ov ) and not effective yet. Sob x > slow adls  rec Change Symbicort  To 80 strength and  Take 2 puffs first thing in am and then another 2 puffs about 12 hours later.  Hold the dulera for now Only use your albuterol (ventolin) as a rescue medication  Continue prilosec Take 30-60 min before first meal of the day and pepcid ac mg at bedtime  GERD  Diet  Work on inhaler technique    06/27/2013 f/u ov/Tiffanyann Deroo    Chief Complaint  Patient presents with  . Follow-up    Breathing and cough have improved some. She is having some cough this am- non prod and she relates to the damp weather. She has not had to use rescue inhaler since her last visit.   much better at hfa but still on pred ? 40  ? Daily.  rec Continue symbicort 80  Take 2 puffs first thing in am and then another 2 puffs about 12 hours later and keep working on perfecting your inhaler technique Only use your albuterol as a rescue medication to be used if you can't catch your breath by resting or doing a relaxed purse lip breathing pattern. The less you use it, the better it will work when you need it.  Goal is less than twice weekly Taper off the prednisone as per Dr Laurann Montana and return if flare off it.     02/18/2015 f/u ov/Aubria Vanecek re: much better on dulera 100 2bid no need for cough meds or albuterol  Chief Complaint  Patient presents with  . Follow-up    Pt  states that her breathing is back at her normal baseline and her cough has improved some. She is using albuterol inhaler once per wk on average.    all smiles,  Not limited by breathing from desired activities  / no sign cough   rec Continue dulera 100 Take 2 puffs first thing in am and then another 2 puffs about 12 hours later.  Only use your albuterol as a rescue medication   Ok to cut your reflux medications as long as not coughing to what you were taking before as long as doing well. F/u as needed    12/06/2015  f/u ov/Khristina Janota re: asthma symbicort 160 maint p "sz" on dulera/ did not follow contingency recs as above  Chief Complaint  Patient presents with  . Follow-up    hacky cough and raspy voice.     onset x sev weeks prior to OV  Indolent/ gradually worse cough assoc with overt HB symptoms 24/7  rec symbicort 80 Take 2 puffs first thing in am and then another 2 puffs about 12 hours later.  Only use your albuterol as a rescue medication  omepraprazole 40 mg Take 30-60 min before first meal of the day and Pepcid 20 mg  at bedtime Gaviscon liquid as needed or Maalox plus  GERD diet     01/11/2018  f/u ov/Blakely Maranan re: re establish re asthma  Chief Complaint  Patient presents with  . Follow-up    Breathing is overall doing well. She is using her albuterol inhaler and neb both once daily on average.   Dyspnea:  Not limited by breathing from desired activities   Cough: no Sleep: ok SABA use:  Misunderstood and using saba bid as maint  Some nasal symptoms better with zyrtec  rec Plan A = Automatic = symbicort 80 Take 2 puffs first thing in am and then another 2 puffs about 12 hours later.  Work on inhaler technique:   Plan B = Backup Only use your albuterol Ventolin)  as a rescue medication  Plan C = Crisis - only use your albuterol nebulizer if you first try Plan B  Omepraprazole 40 mg Take 30-60 min before first meal of the day and Pepcid 20 mg at bedtime GERD diet   Please schedule  a follow up office visit in 6 weeks, call sooner if needed with all medications /inhalers/ solutions in hand so we can verify exactly what you are taking. This includes all medications from all doctors and over the counters    02/22/2018  f/u ov/Shelbey Spindler re: cough varaint asthma vs uacs  Chief Complaint  Patient presents with  . Follow-up    PFT done today.    Dyspnea:  Not limited by breathing from desired activities   Cough: none Sleep: fine  SABA use:  None    No obvious day to day or daytime variability or assoc excess/ purulent sputum or mucus plugs or hemoptysis or cp or chest tightness, subjective wheeze or overt sinus or hb symptoms. No unusual exposure hx or h/o childhood pna/ asthma or knowledge of premature birth.  Sleeping  Fine flat without nocturnal  or early am exacerbation  of respiratory  c/o's or need for noct saba. Also denies any obvious fluctuation of symptoms with weather or environmental changes or other aggravating or alleviating factors except as outlined above   Current Allergies, Complete Past Medical History, Past Surgical History, Family History, and Social History were reviewed in [employer] record.  ROS  The following are not active complaints unless bolded Hoarseness, sore throat, dysphagia, dental problems, itching, sneezing,  nasal congestion or discharge of excess mucus or purulent secretions, ear ache,   fever, chills, sweats, unintended wt loss or wt gain, classically pleuritic or exertional cp,  orthopnea pnd or arm/hand swelling  or leg swelling, presyncope, palpitations, abdominal pain, anorexia, nausea, vomiting, diarrhea  or change in bowel habits or change in bladder habits, change in stools or change in urine, dysuria, hematuria,  rash, arthralgias, visual complaints, headache, numbness, weakness or ataxia or problems with walking or coordination,  change in mood/tremulous  or  memory.        Current Meds  Medication Sig  .  albuterol (PROVENTIL HFA;VENTOLIN HFA) 108 (90 BASE) MCG/ACT inhaler Inhale 2 puffs into the lungs every 4 (four) hours as needed for wheezing.  Marland Kitchen albuterol (PROVENTIL) (2.5 MG/3ML) 0.083% nebulizer solution Take 2.5 mg by nebulization daily. Per PCP Dr Laurann Montana  . budesonide-formoterol (SYMBICORT) 80-4.5 MCG/ACT inhaler Take 2 puffs first thing in am and then another 2 puffs about 12 hours later. (Patient taking differently: Inhale 2 puffs into the lungs every 12 (twelve) hours. )  . Calcium Carbonate-Vitamin D (CALCIUM 600+D) 600-400 MG-UNIT tablet  Take 1 tablet by mouth daily.  . cetirizine (ZYRTEC) 10 MG tablet Take 10 mg by mouth daily as needed for allergies.   Marland Kitchen divalproex (DEPAKOTE) 250 MG DR tablet Take 1 tablet (250 mg total) by mouth 2 (two) times daily.  Marland Kitchen escitalopram (LEXAPRO) 20 MG tablet Take 20 mg by mouth daily.  . famotidine (PEPCID) 20 MG tablet One at bedtime (Patient taking differently: Take 20 mg by mouth at bedtime as needed for heartburn or indigestion. )  . levothyroxine (SYNTHROID, LEVOTHROID) 100 MCG tablet Take 1 tablet (100 mcg total) by mouth daily before breakfast.  . LORazepam (ATIVAN) 0.5 MG tablet Take 1 tablet by mouth 2 (two) times daily as needed.  . metoprolol succinate (TOPROL-XL) 50 MG 24 hr tablet Take 50 mg by mouth daily. Take with or immediately following a meal.  . omeprazole (PRILOSEC) 40 MG capsule Take 1 capsule (40 mg total) by mouth daily before breakfast.  . vitamin B-12 (CYANOCOBALAMIN) 1000 MCG tablet Take 1,000 mcg by mouth daily.  Marland Kitchen zonisamide (ZONEGRAN) 100 MG capsule Take 2 capsules (200 mg total) by mouth at bedtime.           Objective:   Physical Exam    [facility] amb wf nad    01/11/2018       142   12/06/15 147 lb 6.4 oz (66.86 kg)  08/05/15 147 lb (66.679 kg)  02/18/15 150 lb 12.8 oz (68.402 kg)      Vital signs reviewed - Note on arrival 02 sats  98% on RA      HEENT: nl  turbinates bilaterally, and oropharynx. Nl external  ear canals without cough reflex- - full upper denture/ partial lower   NECK :  without JVD/Nodes/TM/ nl carotid upstrokes bilaterally   LUNGS: no acc muscle use,  Nl contour chest which is clear to A and P bilaterally without cough on insp or exp maneuvers   CV:  RRR  no s3 or murmur or increase in P2, and no edema   ABD:  soft and nontender with nl inspiratory excursion in the supine position. No bruits or organomegaly appreciated, bowel sounds nl  MS:  Nl gait/ ext warm without deformities, calf tenderness, cyanosis or clubbing No obvious joint restrictions   SKIN: warm and dry without lesions    NEURO:  alert, approp, nl sensorium with  no motor or cerebellar deficits apparent. - resting tremor              Assessment & Plan:

## 2018-02-22 NOTE — Progress Notes (Signed)
PFT done today 02/22/18.

## 2018-02-22 NOTE — Patient Instructions (Signed)
Ok to use the symbicort 80 up to 2 puffs every 12 hours if you need it for breathing/ coughing /wheezing   Only use your albuterol after you've tried the symbicort as a rescue medication to be used if you can't catch your breath by resting or doing a relaxed purse lip breathing pattern.  - The less you use it, the better it will work when you need it. - Ok to use up to 2 puffs  every 4 hours if you must but call for immediate appointment if use goes up over your usual need - Don't leave home without it !!  (think of it like the spare tire for your car)     If you are satisfied with your treatment plan,  let your doctor know and he/she can either refill your medications or you can return here when your prescription runs out.     If in any way you are not 100% satisfied,  please tell us.  If 100% better, tell your friends!  Pulmonary follow up is as needed

## 2018-02-28 DIAGNOSIS — F419 Anxiety disorder, unspecified: Secondary | ICD-10-CM | POA: Diagnosis not present

## 2018-03-03 DIAGNOSIS — J45909 Unspecified asthma, uncomplicated: Secondary | ICD-10-CM | POA: Diagnosis not present

## 2018-03-03 DIAGNOSIS — F3342 Major depressive disorder, recurrent, in full remission: Secondary | ICD-10-CM | POA: Diagnosis not present

## 2018-03-03 DIAGNOSIS — G40209 Localization-related (focal) (partial) symptomatic epilepsy and epileptic syndromes with complex partial seizures, not intractable, without status epilepticus: Secondary | ICD-10-CM | POA: Diagnosis not present

## 2018-03-03 DIAGNOSIS — F419 Anxiety disorder, unspecified: Secondary | ICD-10-CM | POA: Diagnosis not present

## 2018-03-07 DIAGNOSIS — G40209 Localization-related (focal) (partial) symptomatic epilepsy and epileptic syndromes with complex partial seizures, not intractable, without status epilepticus: Secondary | ICD-10-CM | POA: Diagnosis not present

## 2018-03-17 ENCOUNTER — Other Ambulatory Visit: Payer: Self-pay | Admitting: Internal Medicine

## 2018-03-18 ENCOUNTER — Other Ambulatory Visit: Payer: Self-pay | Admitting: Internal Medicine

## 2018-03-18 DIAGNOSIS — E785 Hyperlipidemia, unspecified: Principal | ICD-10-CM

## 2018-03-18 MED ORDER — LEVOTHYROXINE 125 MCG TABLET
125.0000 ug | ORAL_TABLET | Freq: Every day | ORAL | 2 refills | Status: DC
Start: 2018-03-18 — End: 2018-04-26

## 2018-03-18 MED ORDER — ATORVASTATIN 40 MG TABLET
40.0000 mg | ORAL_TABLET | Freq: Every day | ORAL | 3 refills | Status: DC
Start: 2018-03-18 — End: 2018-07-06

## 2018-03-18 NOTE — Telephone Encounter (Signed)
Requested Prescriptions     Pending Prescriptions Disp Refills    Atorvastatin (LIPITOR) 40 mg tablet 90 tablet 3     Sig: Take 1 tablet by mouth every day.    LevoTHYROxine 125 mcg Tablet 90 tablet 2     Sig: Take 1 tablet by mouth every morning before a meal. (thyroid)       Tammy [name]

## 2018-03-21 NOTE — Telephone Encounter (Signed)
Spoke with daughter, [name] on DPR who stated since starting Depakote her mother has been sleepy, tired, scared, can't stay warm and is confused. She stated she took her to see her PCP and pulmonologist and there wasn't any underlying cause for her symptoms found. This RN stated will discuss with Dr Leta Baptist; she will get a call back. [name] verbalized understanding, appreciation.

## 2018-03-21 NOTE — Telephone Encounter (Signed)
I called pts daughter back. Patient having nausea, vomiting, lethargy since starting depakote. I recommend to stop depakote and continue zonegran for now.    Penni Bombard, MD 04/23/1469, 9:29 PM Certified in Neurology, Neurophysiology and Neuroimaging  [facility] [address] [place], [place] [address] [phone]

## 2018-03-21 NOTE — Telephone Encounter (Signed)
Pts daughter said the pt is not doing well on the Depakote, said she is having multiple side effects and she is wanting to wean her off it. She is requesting a call back from Dr Leta Baptist. She can be reached at [phone]

## 2018-03-31 DIAGNOSIS — H43813 Vitreous degeneration, bilateral: Secondary | ICD-10-CM | POA: Diagnosis not present

## 2018-03-31 DIAGNOSIS — Z961 Presence of intraocular lens: Secondary | ICD-10-CM | POA: Diagnosis not present

## 2018-03-31 DIAGNOSIS — H524 Presbyopia: Secondary | ICD-10-CM | POA: Diagnosis not present

## 2018-03-31 DIAGNOSIS — H04123 Dry eye syndrome of bilateral lacrimal glands: Secondary | ICD-10-CM | POA: Diagnosis not present

## 2018-04-11 ENCOUNTER — Telehealth: Payer: Self-pay | Admitting: Diagnostic Neuroimaging

## 2018-04-11 NOTE — Telephone Encounter (Signed)
pls setup appt with me or NP to discuss further. -VRP

## 2018-04-11 NOTE — Telephone Encounter (Addendum)
Spoke with patient who stated  Dr Leta Baptist told her to see Dr Lilia Pro her psychiatrist for anxiety. Patient stated that Dr Lilia Pro increased Lorazepam 0.5 mg to 3 x day on 6/10. The patient has been taking it regularly 3 x day since then. She stated her shaking and trembling on the inside is getting just as bad as it was.  She stated Dr Lilia Pro wanted her to ask Dr Leta Baptist if she can take a different medication than depakote for her trembling and shaking.  This RN asked if Dr Lilia Pro would consider her decreasing lorazepam back to twice a day. Patient stated she can't do that due to her anxiety. She then reported last Friday morning she was driving and had a "split second where she couldn't think".  She stated this is the 1st time in over 1 year she has experienced something like that.   This RN advised will discuss with Dr Leta Baptist and call her back. She verbalized understanding, appreciation.

## 2018-04-11 NOTE — Telephone Encounter (Signed)
Pt called stating that she had been told from Dr. Leta Baptist to stop taking LORazepam (ATIVAN) 0.5 MG tablet. Pt states her Psychiatrist wanted her to start back on medication but since starting medication again she has been having the same s/e as before stating she has been shaking so bad can't stand up & trembling badly. Pt requesting a call to know how to go about dealing with s/e. Pt states that psychiatrist office told her to contact Dr. Mamie Nick for these s/e.

## 2018-04-11 NOTE — Telephone Encounter (Signed)
Spoke with patient and advised her Dr Leta Baptist wants her to be seen to discuss her issue. Advised she may see NP if he is not available. Scheduled her to see NP on Wed, and advised she arrive 30 minutes early. She verbalized understanding, appreciation.

## 2018-04-12 NOTE — Progress Notes (Signed)
[facility]  PATIENT: Suzanne Galloway DOB: [birth year]   REASON FOR VISIT: Follow-up for seizure disorder HISTORY FROM: Patient    HISTORY OF PRESENT ILLNESS: 02/01/18 VP70 year old female here for evaluation of seizures.  01-23-12 was a stressful year for patient.  Patient was taking care of her husband who ultimately passed away.  Then in Jan 22, 2013  she began to have intermittent episodes of dj vu, anxiety, hot sensation in her body, a liquid sensation going down her chest and abdomen.  Episodes can last minutes at a time.  These occurred every few weeks.  Over time the increased in frequency.  These were attributed to anxiety and patient was treated with Lexapro, Wellbutrin and Ativan.  In 01/23/2015 patient was evaluated by neurologist for tremor.  She also reported episodes of dj vu and therefore EEG was ordered.  No epileptiform discharges were found.  This is followed up with ambulatory EEG which was also normal.  May 2018 patient presented to the hospital with retrograde memory loss, patient thinking it was 01-23-15, no recent memory of a beach trip recently, and other confusion.  Hospital EEG showed occasional sharp wave discharges in the right posterior frontal region.  Patient was then referred to [facility] for video EEG monitoring.  Occasional right temporal sharp wave activity was noted.  Patient had 2 electrographic seizures.  Patient was started on Lamictal but then this was discontinued due to lack of effectiveness and possible side effect.  This was changed to oxcarbazepine but this caused paranoia and confusion.  Patient was then started on zonisamide.  Patient developed some tremor which was initially thought to be anxiety related, but then possibly related to zonisamide, and therefore dose was slightly reduced.  Last event of possible seizure occurred 10/13/17, with memory lapse, confusion, dj vu sensation, anxiety sensation.  Patient currently on zonisamide 200 mg  at bedtime for seizure prevention.  No significant tremors.  She is also on Lexapro 20 mg/day for anxiety and depression.  She is on lorazepam 0.5 mg twice a day for anxiety.  Patient still feels quite anxious and depressed at times, related to being stuck at home.  However when she is able to get out of the house she feels like going back home.  She is working with a Teacher, music and psychologist now.  Patient then requested a second opinion with our office. UPDATE 6/26/2019CM Suzanne Galloway, 77 year old female returns for follow-up with history of seizure disorder.  She is currently on Zonegran 200 mg at bedtime.  She has failed Lamictal not effective toxic oxcarbamazepine cause confusion, and Depakote caused increased tremor and nausea vomiting and lethargy, last seizure event 10/15/2017.  She continues to follow-up with psychiatry, Dr. Lilia Pro  is currently along with Lexapro she has not received any counseling.  She returns for reevaluation   REVIEW OF SYSTEMS: Full 14 system review of systems performed and notable only for those listed, all others are neg:  Constitutional: neg  Cardiovascular: neg Ear/Nose/Throat: neg  Skin: neg Eyes: neg Respiratory: neg Gastroitestinal: neg  Hematology/Lymphatic: neg  Endocrine: neg Musculoskeletal:neg Allergy/Immunology: neg Neurological: Tremors, seizure disorder Psychiatric: Anxiety Sleep : neg   ALLERGIES: Allergies  Allergen Reactions  . Lamictal [Lamotrigine] Other (See Comments)    Increased siezure activity.  . Oxcarbazepine Other (See Comments)    Over sedation  . Atorvastatin   . Buspirone     Increased depression  . Depakote [Divalproex Sodium] Nausea And Vomiting    lethargy  . Dilaudid [  Hydromorphone] Other (See Comments)    Reaction:  Hallucinations   . Dulera [Mometasone Furo-Formoterol Fum] Other (See Comments)    Reaction:  Deja vu seizures   . Lactose Intolerance (Gi) Diarrhea  . Morphine And Related Nausea And Vomiting   . Percocet [Oxycodone-Acetaminophen] Other (See Comments)    Reaction:  Hallucinations   . Pravastatin Other (See Comments)    Reaction:  Muscle pain, "weird dreams"  . Vesicare [Solifenacin] Other (See Comments)    Reaction:  Constipation     HOME MEDICATIONS: Outpatient Medications Prior to Visit  Medication Sig Dispense Refill  . Calcium Carbonate-Vitamin D (CALCIUM 600+D) 600-400 MG-UNIT tablet Take 1 tablet by mouth daily.    . cetirizine (ZYRTEC) 10 MG tablet Take 10 mg by mouth daily as needed for allergies.     Marland Kitchen escitalopram (LEXAPRO) 20 MG tablet Take 20 mg by mouth daily.  11  . famotidine (PEPCID) 20 MG tablet One at bedtime (Patient taking differently: Take 20 mg by mouth at bedtime as needed for heartburn or indigestion. )    . levothyroxine (SYNTHROID, LEVOTHROID) 88 MCG tablet Take 88 mcg by mouth daily before breakfast.    . LORazepam (ATIVAN) 0.5 MG tablet Take 1 tablet by mouth every 8 (eight) hours as needed.   4  . metoprolol succinate (TOPROL-XL) 50 MG 24 hr tablet Take 50 mg by mouth daily. Take with or immediately following a meal.    . omeprazole (PRILOSEC) 40 MG capsule TAKE ONE CAPSULE BY MOUTH DAILY BEFORE BREAKFAST 30 capsule 2  . zonisamide (ZONEGRAN) 100 MG capsule Take 2 capsules (200 mg total) by mouth at bedtime. 180 capsule 4  . albuterol (PROVENTIL HFA;VENTOLIN HFA) 108 (90 BASE) MCG/ACT inhaler Inhale 2 puffs into the lungs every 4 (four) hours as needed for wheezing. 6.7 g 3  . albuterol (PROVENTIL) (2.5 MG/3ML) 0.083% nebulizer solution Take 2.5 mg by nebulization daily. Per PCP Dr Laurann Montana    . budesonide-formoterol (SYMBICORT) 80-4.5 MCG/ACT inhaler Take 2 puffs first thing in am and then another 2 puffs about 12 hours later. (Patient taking differently: Inhale 2 puffs into the lungs every 12 (twelve) hours. ) 1 Inhaler 11  . divalproex (DEPAKOTE) 250 MG DR tablet Take 1 tablet (250 mg total) by mouth 2 (two) times daily. (Patient not taking: Reported  on 04/11/2018) 60 tablet 12  . levothyroxine (SYNTHROID, LEVOTHROID) 100 MCG tablet Take 1 tablet (100 mcg total) by mouth daily before breakfast. 30 tablet 0  . vitamin B-12 (CYANOCOBALAMIN) 1000 MCG tablet Take 1,000 mcg by mouth daily.     No facility-administered medications prior to visit.     PAST MEDICAL HISTORY: Past Medical History:  Diagnosis Date  . Anxiety   . Arthritis    "hands, back, toes" (01/01/2015)  . Asthma   . Chronic bronchitis ([place])    "get it just about q yr" (01/01/2015)  . Depression   . GERD (gastroesophageal reflux disease)   . High cholesterol   . Hyperlipemia   . Hypertension   . Hypothyroidism   . Mitral valve prolapse   . Seizures [facility])    dx Aug 2018, most recent seizure 10/13/17  . Urinary incontinence     PAST SURGICAL HISTORY: Past Surgical History:  Procedure Laterality Date  . BACK SURGERY    . CARPAL TUNNEL RELEASE Bilateral   . CYSTECTOMY Right 09/2014   "hand"   . DILATION AND CURETTAGE OF UTERUS    . FINGER SURGERY Bilateral    "  scraped arthritis out of thumbs"  . INCONTINENCE SURGERY    . LAPAROSCOPIC CHOLECYSTECTOMY    . LUMBAR LAMINECTOMY  1987; 12/2009   ; Archie Endo 01/16/2010  . TOE SURGERY Left    "took bone out of 2nd toe; took cyst out"  . TUBAL LIGATION    . VAGINAL HYSTERECTOMY  1970    FAMILY HISTORY: Family History  Problem Relation Age of Onset  . COPD Father        smoked  . Heart disease Father   . Esophageal cancer Father        smoked  . Transient ischemic attack Father   . Cancer Father        kidney  . Deep vein thrombosis Brother   . Parkinson's disease Mother   . Cancer Daughter     SOCIAL HISTORY: Social History   Socioeconomic History  . Marital status: Widowed    Spouse name: Not on file  . Number of children: 3  . Years of education: Not on file  . Highest education level: Not on file  Occupational History  . Occupation: Retired    Comment: [employer] shop  Social Needs  .  Financial resource strain: Not on file  . Food insecurity:    Worry: Not on file    Inability: Not on file  . Transportation needs:    Medical: Not on file    Non-medical: Not on file  Tobacco Use  . Smoking status: Never Smoker  . Smokeless tobacco: Never Used  Substance and Sexual Activity  . Alcohol use: Yes    Comment: 01/01/2015 "mixed drink q 2 months or so"  . Drug use: No  . Sexual activity: Not Currently  Lifestyle  . Physical activity:    Days per week: Not on file    Minutes per session: Not on file  . Stress: Not on file  Relationships  . Social connections:    Talks on phone: Not on file    Gets together: Not on file    Attends religious service: Not on file    Active member of club or organization: Not on file    Attends meetings of clubs or organizations: Not on file    Relationship status: Not on file  . Intimate partner violence:    Fear of current or ex partner: Not on file    Emotionally abused: Not on file    Physically abused: Not on file    Forced sexual activity: Not on file  Other Topics Concern  . Not on file  Social History Narrative   Lives with daughter, [name]   Retired, widowed   Education- high school   Caffeine 2 cups daily     PHYSICAL EXAM  Vitals:   04/13/18 1003  BP: 128/73  Pulse: 66  Weight: 137 lb (62.1 kg)  Height: 5\' 2"  (1.575 m)   Body mass index is 25.06 kg/m.  Generalized: Well developed, in no acute distress  Head: normocephalic and atraumatic,. Oropharynx benign  Neck: Supple,  Musculoskeletal: No deformity   Neurological examination   Mentation: Alert oriented to time, place, history taking. Attention span and concentration appropriate. Recent and remote memory intact.  Follows all commands speech and language fluent.   Cranial nerve II-XII: Pupils were equal round reactive to light extraocular movements were full, visual field were full on confrontational test. Facial sensation and strength were normal.  hearing was intact to finger rubbing bilaterally. Uvula tongue midline. head turning  and shoulder shrug were normal and symmetric.Tongue protrusion into cheek strength was normal. Motor: normal bulk and tone, full strength in the BUE, BLE, fine finger movements normal, no pronator drift. No focal weakness Sensory: normal and symmetric to light touch,   Coordination: finger-nose-finger, heel-to-shin bilaterally, no dysmetria, no tremor Reflexes: Symmetric upper and lower plantar responses were flexor bilaterally. Gait and Station: Rising up from seated position without assistance, normal stance,  moderate stride, good arm swing, smooth turning, able to perform tiptoe, and heel walking without difficulty. Tandem gait is steady  DIAGNOSTIC DATA (LABS, IMAGING, TESTING) - I reviewed patient records, labs, notes, testing and imaging myself where available.  Lab Results  Component Value Date   WBC 7.3 02/19/2017   HGB 12.5 02/19/2017   HCT 37.3 02/19/2017   MCV 92.1 02/19/2017   PLT 260 02/19/2017      Component Value Date/Time   NA 138 02/19/2017 0935   K 4.6 02/19/2017 0935   CL 101 02/19/2017 0935   CO2 28 02/19/2017 0935   GLUCOSE 112 (H) 02/19/2017 0935   BUN 14 02/19/2017 0935   CREATININE 0.87 02/19/2017 0935   CREATININE 0.82 12/27/2014 0714   CALCIUM 9.4 02/19/2017 0935   PROT 7.4 02/19/2017 0935   ALBUMIN 4.6 02/19/2017 0935   AST 28 02/19/2017 0935   ALT 21 02/19/2017 0935   ALKPHOS 69 02/19/2017 0935   BILITOT 0.4 02/19/2017 0935   GFRNONAA >60 02/19/2017 0935   GFRAA >60 02/19/2017 0935   Lab Results  Component Value Date   CHOL 174 01/02/2015   HDL 37 (L) 01/02/2015   LDLCALC 105 (H) 01/02/2015   TRIG 158 (H) 01/02/2015   CHOLHDL 4.7 01/02/2015   Lab Results  Component Value Date   HGBA1C 6.3 (H) 01/03/2015   Lab Results  Component Value Date   VITAMINB12 312 02/20/2017   Lab Results  Component Value Date   TSH 10.174 (H) 02/20/2017      ASSESSMENT  AND PLAN  77 y.o. year old female  has a past medical history of Anxiety, localized epilepsy last event December 2018.  Patient has failed Lamictal, Trileptal and Depakote.   She is currently on Zonegran 200 mg at bedtime    PLAN: Continue Zonegran  200mg  daily  Will Check Zonegran level  May add [name] in the future Continue psych follow up, continue Lexapro and Ativan  call for seizure activity Follow up in 4 months Dennie Bible, [facility], [facility], [facility] [address], [place], [place] [address] [phone]

## 2018-04-13 ENCOUNTER — Encounter: Payer: Self-pay | Admitting: Nurse Practitioner

## 2018-04-13 ENCOUNTER — Ambulatory Visit: Payer: Medicare Other | Admitting: Nurse Practitioner

## 2018-04-13 VITALS — BP 128/73 | HR 66 | Ht 62.0 in | Wt 137.0 lb

## 2018-04-13 DIAGNOSIS — Z5181 Encounter for therapeutic drug level monitoring: Secondary | ICD-10-CM | POA: Diagnosis not present

## 2018-04-13 DIAGNOSIS — G40109 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, not intractable, without status epilepticus: Secondary | ICD-10-CM

## 2018-04-13 NOTE — Patient Instructions (Signed)
Continue Zonegran  200mg  daily  Will Check Zonegran level  May add [name] in the future Continue psych follow up Follow up in 4 months

## 2018-04-19 ENCOUNTER — Telehealth: Payer: Self-pay | Admitting: *Deleted

## 2018-04-19 ENCOUNTER — Ambulatory Visit: Payer: Medicare Other | Attending: Internal Medicine

## 2018-04-19 DIAGNOSIS — E7800 Pure hypercholesterolemia, unspecified: Principal | ICD-10-CM | POA: Insufficient documentation

## 2018-04-19 LAB — LIPID PANEL
CHOLESTEROL: 179 mg/dL (ref 0–200)
HDL CHOLESTEROL: 59 mg/dL (ref >=35)
LDL CHOLESTEROL CALCULATION: 95 mg/dL (ref <130)
NON-HDL CHOLESTEROL: 120 mg/dL (ref <=150)
TOTAL CHOLESTEROL:HDL RATIO: 3 (ref <4.0)
TRIGLYCERIDE: 125 mg/dL (ref 35–160)

## 2018-04-19 LAB — THYROID STIMULATING HORMONE: THYROID STIMULATING HORMONE: 0.16 u[IU]/mL — AB (ref 0.35–3.30)

## 2018-04-19 LAB — HEPATIC FUNCTION PANEL
ALANINE TRANSFERASE (ALT): 15 U/L (ref 5–54)
ALBUMIN: 3.6 g/dL (ref 3.2–4.6)
ALKALINE PHOSPHATASE (ALP): 68 U/L (ref 35–115)
ASPARTATE TRANSAMINASE (AST): 20 U/L (ref 15–43)
BILIRUBIN TOTAL: 0.9 mg/dL (ref 0.3–1.3)
PROTEIN: 6.4 g/dL (ref 6.3–8.3)

## 2018-04-19 LAB — ZONISAMIDE LEVEL: ZONISAMIDE: 8.9 ug/mL — AB (ref 10.0–40.0)

## 2018-04-19 MED ORDER — ZONISAMIDE 100 MG PO CAPS: 300.0000 mg | ORAL_CAPSULE | Freq: Every day | ORAL | 6 refills | Status: DC

## 2018-04-19 NOTE — Telephone Encounter (Signed)
Med list updated and refilled

## 2018-04-19 NOTE — Addendum Note (Signed)
Addended by: Otilio Jefferson on: 04/19/2018 10:59 AM   Modules accepted: Orders

## 2018-04-19 NOTE — Telephone Encounter (Signed)
Spoke with and informed her that her Zonegran level is 8.9, subtherapeutic. this RN asked her if she wants to add on an additional 100 mg at night or add another seizure medication per the NP.  She stated she would prefer adding another 100 mg tablet at night. Confirmed her pharmacy and advised she call for any questions or concerns. She verbalized understanding, appreciation of call.

## 2018-04-22 ENCOUNTER — Ambulatory Visit: Payer: Medicare Other | Admitting: Neurology

## 2018-04-26 ENCOUNTER — Ambulatory Visit: Payer: Medicare Other | Admitting: Internal Medicine

## 2018-04-26 ENCOUNTER — Encounter: Payer: Self-pay | Admitting: Internal Medicine

## 2018-04-26 VITALS — BP 116/70 | HR 70 | Temp 97.4°F | Resp 14 | Ht 66.0 in | Wt 180.1 lb

## 2018-04-26 DIAGNOSIS — F32A Depression, unspecified: Secondary | ICD-10-CM

## 2018-04-26 DIAGNOSIS — I480 Paroxysmal atrial fibrillation: Secondary | ICD-10-CM

## 2018-04-26 DIAGNOSIS — E039 Hypothyroidism, unspecified: Secondary | ICD-10-CM

## 2018-04-26 DIAGNOSIS — F419 Anxiety disorder, unspecified: Secondary | ICD-10-CM

## 2018-04-26 DIAGNOSIS — I2782 Chronic pulmonary embolism: Secondary | ICD-10-CM

## 2018-04-26 DIAGNOSIS — F329 Major depressive disorder, single episode, unspecified: Secondary | ICD-10-CM

## 2018-04-26 DIAGNOSIS — E785 Hyperlipidemia, unspecified: Principal | ICD-10-CM

## 2018-04-26 MED ORDER — LEVOTHYROXINE 112 MCG TABLET
ORAL_TABLET | ORAL | 2 refills | Status: DC
Start: 2018-04-26 — End: 2018-07-06

## 2018-04-26 NOTE — Progress Notes (Signed)
Chief Complaint: Patient presents today with follow-up on the blood test results    Hyperlipidemia,   management discussed with patient, she has been working on low fat diet, she has been doing regular exercise. Patient verbalised understanding about the significance for regular exercise and low fat diet in management of this problem, she does walk with her dog 20 minutes a day  Lab Results   Lab Name Value Date/Time    CHOL 179 04/19/2018 08:12 AM    CHOL 188 05/09/2014 09:44 AM    LDLC 95 04/19/2018 08:12 AM    LDLC 107 05/09/2014 09:44 AM    HDL 59 04/19/2018 08:12 AM    HDL 59 05/09/2014 09:44 AM    TRIG 125 04/19/2018 08:12 AM    TRIG 108 05/09/2014 09:44 AM       No symptoms of hypo or hyperthyroidism: no decreased or increased weight, no feeling cold/chilly or excessively warm, no diarrhea or constipation, no undue sweatiness, anxiety or palpitations.   Lab Results   Lab Name Value Date/Time    TSH 0.16 (L) 04/19/2018 08:12 AM    TSH 1.89 05/09/2014 09:44 AM    FT4 1.27 06/17/2009 08:16 AM          Anxiety and depression: Her symptoms are well can trolled on current regimen. Her depressed mood, feelings of hopelessness/worthlessess, insomnia, difficulty concentrating are improved , no psychotic symptoms and no suicidal or homicidal thoughts,  Psychosocial factors include: family/personal problems.      Ros  complete review of symptons was done all other systems are negative except as otherwise stated in HPI      Vs  BP 116/70 (SITE: right arm, Orthostatic Position: sitting, Cuff Size: regular)   Pulse 70   Temp 36.3 C (97.4 F) (Temporal)   Resp 14   Ht 1.676 m (5\' 6" )   Wt 81.7 kg (180 lb 1.9 oz)   SpO2 97%   BMI 29.07 kg/m     Physical exam -  General Appearance: healthy, alert, no distress, pleasant affect, cooperative.  Eyes:  conjunctivae and corneas clear. PERRL, EOM's intact. sclerae normal.  Ears:  external inspection of ears show no abnormality.  Lungs: no chest deformities noted .lungs  clear to auscultation.  Heart:  normal rate and regular rhythm, no murmurs,  Extremities:  no cyanosis, clubbing, or edema.    ASSESMENT AND PLAN     (E78.5) Dyslipidemia  (primary encounter diagnosis)  Comment: LDL is at goal  Plan: On treatment    (I48.0) Paroxysmal atrial fibrillation (HCC)  Comment: History of  Plan:     (I27.82) Other chronic pulmonary embolism without acute cor pulmonale (HCC)  Comment: History of  Plan:     (F41.9,  F32.9) Anxiety and depression  Comment: Well-controlled, currently not on any medications  Plan:     (E03.9) Acquired hypothyroidism  Comment: TSH is low  Plan: THYROID STIMULATING HORMONE  Advised her to take 125 mcg of levothyroxine on Saturday and Sunday and 112 mcg on the weekdays  Repeat TSH in 8 weeks     I did review patient's past medical and family/social history, no changes noted.  Barriers to Learning assessed: none. Patient verbalizes understanding of teaching and instructions.  Zettie CooleyAPEET Kalliope Riesen MD      Electronically Signed By:  Zettie CooleyApeet Raymundo Rout, MD  Physician associate   [facility] [place]  [phone]

## 2018-04-26 NOTE — Nursing Note (Signed)
Tammy [name] has been identified by name, date of birth and address.This patient is accompanied by self  Since you last saw your [facility] provider, have you gone to the emergency room/urgent care or been hospitalized?  No  Have you started any over-the-counter medications, herbal supplements, vitamins or home remedies?  No    [name], MA

## 2018-04-26 NOTE — Patient Instructions (Addendum)
   Take levothyroxine 112 mcg Monday to Friday and levothyroxine 125 mcg on Saturday and Sunday    Repeat TSH in 8 weeks

## 2018-04-28 ENCOUNTER — Other Ambulatory Visit: Payer: Self-pay | Admitting: Internal Medicine

## 2018-04-28 DIAGNOSIS — Z1231 Encounter for screening mammogram for malignant neoplasm of breast: Secondary | ICD-10-CM

## 2018-05-06 ENCOUNTER — Telehealth: Payer: Self-pay | Admitting: *Deleted

## 2018-05-06 MED ORDER — LACOSAMIDE 50 MG PO TABS
50.0000 mg | ORAL_TABLET | Freq: Two times a day (BID) | ORAL | 5 refills | Status: DC
Start: 2018-05-06 — End: 2018-09-20

## 2018-05-06 NOTE — Addendum Note (Signed)
Addended by: Evlyn Courier C on: 05/06/2018 11:03 AM   Modules accepted: Orders

## 2018-05-06 NOTE — Telephone Encounter (Signed)
Pt said since changing her meds on 6/26 she is becoming more and more depressed everyday. She said psychiatrist did not want to add anything. Pt said she is 77 yo and she knows it is a chemical change and she does not feel she needs therapy which is what the psychiatrist has suggested. Please call to advise at [phone]

## 2018-05-06 NOTE — Telephone Encounter (Signed)
Zonegran can cause increased depression 6% of patients. Decrease back to previous dose. Add on [name] 50mg  BID please call the pt

## 2018-05-06 NOTE — Telephone Encounter (Signed)
error 

## 2018-05-06 NOTE — Telephone Encounter (Addendum)
Lacosamide refill Rx successfully faxed to [facility]. Spoke with patient and informed her that NP stated zonegran can increase depression in a small percentage of patients. Advised her that NP has instructed her to decrease zonegran back to previous dose of taking total 200 mg nightly. Advised her the NP has added on lacosamide ([name]) take twice daily. During this conversation that patient received a call from her pharmacy and was told they will order the [name], will be read for her to pick up tomorrow. The patient came back to conversation with this RN and verbalized understanding, appreciation of call. This RN advised she call back for any problems, questions or concerns.

## 2018-05-19 DIAGNOSIS — H903 Sensorineural hearing loss, bilateral: Secondary | ICD-10-CM | POA: Diagnosis not present

## 2018-05-30 DIAGNOSIS — F419 Anxiety disorder, unspecified: Secondary | ICD-10-CM | POA: Diagnosis not present

## 2018-06-03 ENCOUNTER — Ambulatory Visit: Payer: Medicare Other

## 2018-06-03 ENCOUNTER — Ambulatory Visit
Admission: RE | Admit: 2018-06-03 | Discharge: 2018-06-03 | Disposition: A | Discharge status: Home / Self Care | Payer: Medicare Other | Source: Ambulatory Visit | Attending: Internal Medicine | Admitting: Internal Medicine

## 2018-06-03 DIAGNOSIS — Z1231 Encounter for screening mammogram for malignant neoplasm of breast: Secondary | ICD-10-CM | POA: Diagnosis not present

## 2018-06-13 ENCOUNTER — Other Ambulatory Visit: Payer: Self-pay | Admitting: *Deleted

## 2018-06-13 NOTE — Patient Outreach (Signed)
Follow up and case closure. Mrs. Suzanne Galloway is doing well. No seizure activity for a long time. She has been put on new medications which she is tolerating well. She will be following up with her neurologist in October. She reports she has had an annual wellness visit. She is up to date on her immunizations, mammogram, colonoscopy. She reports no new needs and I am closing her case today.  Suzanne Galloway. Suzanne Neither, MSN, [facility] Gerontological Nurse Practitioner [facility] Care Management [phone]

## 2018-06-21 ENCOUNTER — Ambulatory Visit: Payer: Medicare Other | Attending: Internal Medicine

## 2018-06-21 DIAGNOSIS — E039 Hypothyroidism, unspecified: Principal | ICD-10-CM | POA: Insufficient documentation

## 2018-06-21 LAB — THYROID STIMULATING HORMONE: THYROID STIMULATING HORMONE: 0.19 u[IU]/mL — AB (ref 0.35–3.30)

## 2018-06-23 ENCOUNTER — Telehealth: Payer: Self-pay | Admitting: Internal Medicine

## 2018-06-23 DIAGNOSIS — E039 Hypothyroidism, unspecified: Principal | ICD-10-CM

## 2018-06-23 NOTE — Telephone Encounter (Signed)
Let her know her TSH is low that means she is hyperthyroid (increase thyroid hormones) I want her to cut back the levothyroxine to 112 mcg on the weekdays and weekends, repeat TSH in 8 weeks

## 2018-06-24 ENCOUNTER — Telehealth: Payer: Self-pay | Admitting: Internal Medicine

## 2018-06-24 NOTE — Telephone Encounter (Signed)
3 patient identifiers used  Tammy [name] is a 77yr old female    Reviewed results and message from MD with pt. Questions were answered and no further questions or concerns at this time. Advised to please callback to the advice line at any time if questions arise.    Floyde Parkins, RN  PCN Domingo Dimes

## 2018-06-24 NOTE — Telephone Encounter (Signed)
Can nurse triage help with this?    Tammy [name]

## 2018-06-24 NOTE — Telephone Encounter (Signed)
Patient not home per husband, states that she will be back in a few hours and asks that we call back then.  Will try patient again after 3 pm.

## 2018-07-06 ENCOUNTER — Other Ambulatory Visit: Payer: Self-pay | Admitting: Internal Medicine

## 2018-07-06 DIAGNOSIS — E785 Hyperlipidemia, unspecified: Principal | ICD-10-CM

## 2018-07-06 NOTE — Telephone Encounter (Signed)
Requested Prescriptions     Pending Prescriptions Disp Refills    LevoTHYROxine 112 mcg Tablet 90 tablet 2     Sig: One pill on the weekdays (thyroid)    Atorvastatin (LIPITOR) 40 mg tablet 90 tablet 3     Sig: Take 1 tablet by mouth every day.

## 2018-07-07 MED ORDER — ATORVASTATIN 40 MG TABLET
40.0000 mg | ORAL_TABLET | Freq: Every day | ORAL | 3 refills | Status: DC
Start: 2018-07-07 — End: 2019-01-25

## 2018-07-07 MED ORDER — LEVOTHYROXINE 112 MCG TABLET
ORAL_TABLET | ORAL | 2 refills | Status: DC
Start: 2018-07-07 — End: 2018-11-16

## 2018-07-19 DIAGNOSIS — Z23 Encounter for immunization: Secondary | ICD-10-CM | POA: Diagnosis not present

## 2018-07-26 ENCOUNTER — Ambulatory Visit: Payer: Medicare Other

## 2018-07-26 DIAGNOSIS — Z23 Encounter for immunization: Secondary | ICD-10-CM

## 2018-07-26 NOTE — Nursing Note (Signed)
The Influenza Vaccine VIS document for the flu injection was given to patient to review. Patient or person named in permission has answered no to any history of egg allergy, previous serious reaction to a influenza vaccine or current illness which would preclude them receiving an immunization. Any questions were referred to the physician. The Influenza Vaccine was then administered per protocol or by Policy 2041 using the standing order from Dr. James Kirk (Academic) or Dr. Kurt Slapnik (Network).   The patient was observed for immediate reactions to the vaccine per protocol. None were observed.   Hancel Ion, MA

## 2018-08-03 DIAGNOSIS — E039 Hypothyroidism, unspecified: Secondary | ICD-10-CM | POA: Diagnosis not present

## 2018-08-03 DIAGNOSIS — E782 Mixed hyperlipidemia: Secondary | ICD-10-CM | POA: Diagnosis not present

## 2018-08-03 DIAGNOSIS — K219 Gastro-esophageal reflux disease without esophagitis: Secondary | ICD-10-CM | POA: Diagnosis not present

## 2018-08-03 DIAGNOSIS — J45909 Unspecified asthma, uncomplicated: Secondary | ICD-10-CM | POA: Diagnosis not present

## 2018-08-05 ENCOUNTER — Telehealth: Payer: Self-pay | Admitting: Diagnostic Neuroimaging

## 2018-08-05 NOTE — Telephone Encounter (Signed)
Patient called and stated she has called before a few times in an effort to move her apt up. She is worried that her medication is causing extreme anxiety. She states that she is on 2 different medications, managing her seizrues. She has an apt next week but would like to know if she could speak with someone regarding this. Please call and advise.

## 2018-08-05 NOTE — Telephone Encounter (Signed)
Not likely medication related (last changes in July). Monitor symptoms. Follow up with PCP or psychiatry re:: anxiety. Follow up in neurology clinic next week. -VRP

## 2018-08-05 NOTE — Telephone Encounter (Signed)
Spoke with patient and advied her of Dr [employer] reply. She stated she saw her PCP 2 days ago and discussed her anxiety. She stated her PCP told her he felt her anxiety was due to her seizure medications.  She has a follow up with Dr Leta Baptist next Wed; advised she can discuss seizure medications with him then. Advised she monitor her symptoms, and if she feels she needs it, this office has a dr on call on weekends. She verbalized understanding, appreciation.

## 2018-08-10 ENCOUNTER — Encounter: Payer: Self-pay | Admitting: Diagnostic Neuroimaging

## 2018-08-10 ENCOUNTER — Encounter

## 2018-08-10 ENCOUNTER — Ambulatory Visit: Payer: Medicare Other | Admitting: Diagnostic Neuroimaging

## 2018-08-10 VITALS — BP 104/58 | HR 56 | Ht 62.0 in | Wt 133.8 lb

## 2018-08-10 DIAGNOSIS — R413 Other amnesia: Secondary | ICD-10-CM | POA: Diagnosis not present

## 2018-08-10 DIAGNOSIS — Z5181 Encounter for therapeutic drug level monitoring: Secondary | ICD-10-CM

## 2018-08-10 DIAGNOSIS — G40109 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, not intractable, without status epilepticus: Secondary | ICD-10-CM

## 2018-08-10 DIAGNOSIS — R412 Retrograde amnesia: Secondary | ICD-10-CM | POA: Diagnosis not present

## 2018-08-10 NOTE — Progress Notes (Signed)
[facility]  PATIENT: Suzanne Galloway DOB: 1940-12-14  REFERRING CLINICIAN: Electa Sniff HISTORY FROM: patient  REASON FOR VISIT: follow up    HISTORICAL  CHIEF COMPLAINT:  Chief Complaint  Patient presents with  . Seizures    rm 6, dgtr- [name], "no seizure activity; anxiety- is it due to my seizure meds, PCP wants seizure meds readjusted""  . Follow-up    4 month    HISTORY OF PRESENT ILLNESS:   UPDATE (08/10/18, VRP): Since last visit, struggling with anxiety and depression sxs. No def sz except mild deja vu spell 1 week ago. No alleviating or aggravating factors. Tolerating meds. Psychiatry has increased lexapro 1 month ago.  PRIOR HPI (02/01/18): 77 year old female here for evaluation of seizures.  February 04, 2012 was a stressful year for patient.  Patient was taking care of her husband who ultimately passed away.  Then in 03-Feb-2013 she began to have intermittent episodes of dj vu, anxiety, hot sensation in her body, a liquid sensation going down her chest and abdomen.  Episodes can last minutes at a time.  These occurred every few weeks.  Over time the increased in frequency.  These were attributed to anxiety and patient was treated with Lexapro, Wellbutrin and Ativan.  In 02-04-2015 patient was evaluated by neurologist for tremor.  She also reported episodes of dj vu and therefore EEG was ordered.  No epileptiform discharges were found.  This is followed up with ambulatory EEG which was also normal.  May 2018 patient presented to the hospital with retrograde memory loss, patient thinking it was 04-Feb-2015, no recent memory of a beach trip recently, and other confusion.  Hospital EEG showed occasional sharp wave discharges in the right posterior frontal region.  Patient was then referred to [facility] for video EEG monitoring.  Occasional right temporal sharp wave activity was noted.  Patient had 2 electrographic seizures.  Patient was started on Lamictal but then this was discontinued due  to lack of effectiveness and possible side effect.  This was changed to oxcarbazepine but this caused paranoia and confusion.  Patient was then started on zonisamide.  Patient developed some tremor which was initially thought to be anxiety related, but then possibly related to zonisamide, and therefore dose was slightly reduced.  Last event of possible seizure occurred 10/13/17, with memory lapse, confusion, dj vu sensation, anxiety sensation.  Patient currently on zonisamide 200 mg at bedtime for seizure prevention.  No significant tremors.  She is also on Lexapro 20 mg/day for anxiety and depression.  She is on lorazepam 0.5 mg twice a day for anxiety.  Patient still feels quite anxious and depressed at times, related to being stuck at home.  However when she is able to get out of the house she feels like going back home.  She is working with a Teacher, music and psychologist now.  Patient then requested a second opinion with our office.    REVIEW OF SYSTEMS: Full 14 system review of systems performed and negative with exception of: Weight loss fatigue memory loss depression anxiety change in appetite diarrhea shortness of breath feeling hot feeling cold hearing loss.  ALLERGIES: Allergies  Allergen Reactions  . Lamictal [Lamotrigine] Other (See Comments)    Increased siezure activity.  . Oxcarbazepine Other (See Comments)    Over sedation  . Atorvastatin   . Buspirone     Increased depression  . Depakote [Divalproex Sodium] Nausea And Vomiting    lethargy  . Dilaudid [Hydromorphone] Other (See Comments)  Reaction:  Hallucinations   . Dulera [Mometasone Furo-Formoterol Fum] Other (See Comments)    Reaction:  Deja vu seizures   . Lactose Intolerance (Gi) Diarrhea  . Morphine And Related Nausea And Vomiting  . Percocet [Oxycodone-Acetaminophen] Other (See Comments)    Reaction:  Hallucinations   . Pravastatin Other (See Comments)    Reaction:  Muscle pain, "weird dreams"  .  Vesicare [Solifenacin] Other (See Comments)    Reaction:  Constipation     HOME MEDICATIONS: Outpatient Medications Prior to Visit  Medication Sig Dispense Refill  . Calcium Carbonate-Vitamin D (CALCIUM 600+D) 600-400 MG-UNIT tablet Take 1 tablet by mouth daily.    . cetirizine (ZYRTEC) 10 MG tablet Take 10 mg by mouth daily as needed for allergies.     Marland Kitchen escitalopram (LEXAPRO) 20 MG tablet Take 20 mg by mouth daily.  11  . famotidine (PEPCID) 20 MG tablet One at bedtime (Patient taking differently: Take 20 mg by mouth at bedtime as needed for heartburn or indigestion. )    . lacosamide ([name]) 50 MG TABS tablet Take 1 tablet (50 mg total) by mouth 2 (two) times daily. 60 tablet 5  . levothyroxine (SYNTHROID, LEVOTHROID) 88 MCG tablet Take 88 mcg by mouth daily before breakfast.    . LORazepam (ATIVAN) 0.5 MG tablet Take 1 tablet by mouth every 8 (eight) hours as needed.   4  . metoprolol succinate (TOPROL-XL) 50 MG 24 hr tablet Take 50 mg by mouth daily. Take with or immediately following a meal.    . omeprazole (PRILOSEC) 40 MG capsule TAKE ONE CAPSULE BY MOUTH DAILY BEFORE BREAKFAST 30 capsule 2  . zonisamide (ZONEGRAN) 100 MG capsule Take 3 capsules (300 mg total) by mouth at bedtime. (Patient taking differently: Take 200 mg by mouth at bedtime. ) 90 capsule 6   No facility-administered medications prior to visit.     PAST MEDICAL HISTORY: Past Medical History:  Diagnosis Date  . Anxiety   . Arthritis    "hands, back, toes" (01/01/2015)  . Asthma   . Chronic bronchitis ([place])    "get it just about q yr" (01/01/2015)  . Depression   . GERD (gastroesophageal reflux disease)   . High cholesterol   . Hyperlipemia   . Hypertension   . Hypothyroidism   . Mitral valve prolapse   . Seizures [facility])    dx Aug 2018, most recent seizure 10/13/17  . Urinary incontinence     PAST SURGICAL HISTORY: Past Surgical History:  Procedure Laterality Date  . BACK SURGERY    . CARPAL TUNNEL  RELEASE Bilateral   . CYSTECTOMY Right 09/2014   "hand"   . DILATION AND CURETTAGE OF UTERUS    . FINGER SURGERY Bilateral    "scraped arthritis out of thumbs"  . INCONTINENCE SURGERY    . LAPAROSCOPIC CHOLECYSTECTOMY    . LUMBAR LAMINECTOMY  1987; 12/2009   ; Archie Endo 01/16/2010  . TOE SURGERY Left    "took bone out of 2nd toe; took cyst out"  . TUBAL LIGATION    . VAGINAL HYSTERECTOMY  1970    FAMILY HISTORY: Family History  Problem Relation Age of Onset  . COPD Father        smoked  . Heart disease Father   . Esophageal cancer Father        smoked  . Transient ischemic attack Father   . Cancer Father        kidney  . Deep vein thrombosis Brother   .  Parkinson's disease Mother   . Cancer Daughter     SOCIAL HISTORY:  Social History   Socioeconomic History  . Marital status: Widowed    Spouse name: Not on file  . Number of children: 3  . Years of education: Not on file  . Highest education level: Not on file  Occupational History  . Occupation: Retired    Comment: [employer] shop  Social Needs  . Financial resource strain: Not on file  . Food insecurity:    Worry: Not on file    Inability: Not on file  . Transportation needs:    Medical: Not on file    Non-medical: Not on file  Tobacco Use  . Smoking status: Never Smoker  . Smokeless tobacco: Never Used  Substance and Sexual Activity  . Alcohol use: Yes    Comment: 01/01/2015 "mixed drink q 2 months or so"  . Drug use: No  . Sexual activity: Not Currently  Lifestyle  . Physical activity:    Days per week: Not on file    Minutes per session: Not on file  . Stress: Not on file  Relationships  . Social connections:    Talks on phone: Not on file    Gets together: Not on file    Attends religious service: Not on file    Active member of club or organization: Not on file    Attends meetings of clubs or organizations: Not on file    Relationship status: Not on file  . Intimate partner violence:      Fear of current or ex partner: Not on file    Emotionally abused: Not on file    Physically abused: Not on file    Forced sexual activity: Not on file  Other Topics Concern  . Not on file  Social History Narrative   Lives with daughter, [name]   Retired, widowed   Education- high school   Caffeine 2 cups daily     PHYSICAL EXAM   GENERAL EXAM/CONSTITUTIONAL: Vitals:  Vitals:   08/10/18 1408  BP: (!) 104/58  Pulse: (!) 56  Weight: 133 lb 12.8 oz (60.7 kg)  Height: 5\' 2"  (1.575 m)   Wt Readings from Last 9 Encounters:  08/10/18 133 lb 12.8 oz (60.7 kg)  04/13/18 137 lb (62.1 kg)  02/22/18 134 lb 12.8 oz (61.1 kg)  02/01/18 140 lb 12.8 oz (63.9 kg)  01/11/18 142 lb 3.2 oz (64.5 kg)  10/21/17 140 lb (63.5 kg)  07/09/17 141 lb 12.8 oz (64.3 kg)  06/11/17 138 lb (62.6 kg)  03/17/17 140 lb (63.5 kg)    Body mass index is 24.47 kg/m. No exam data present  Patient is in no distress; well developed, nourished and groomed; neck is supple  CARDIOVASCULAR:  Examination of carotid arteries is normal; no carotid bruits  Regular rate and rhythm, no murmurs  Examination of peripheral vascular system by observation and palpation is normal  EYES:  Ophthalmoscopic exam of optic discs and posterior segments is normal; no papilledema or hemorrhages  MUSCULOSKELETAL:  Gait, strength, tone, movements noted in Neurologic exam below  NEUROLOGIC: MENTAL STATUS:  MMSE - Mini Mental State Exam 03/17/2017 06/16/2016  Not completed: Refused;Unable to complete Unable to complete    awake, alert, oriented to person, place and time  recent and remote memory intact  normal attention and concentration  language fluent, comprehension intact, naming intact,   fund of knowledge appropriate  CRANIAL NERVE:   2nd -  no papilledema on fundoscopic exam  2nd, 3rd, 4th, 6th - pupils equal and reactive to light, visual fields full to confrontation, extraocular muscles intact, no  nystagmus  5th - facial sensation symmetric  7th - facial strength symmetric  8th - hearing intact  9th - palate elevates symmetrically, uvula midline  11th - shoulder shrug symmetric  12th - tongue protrusion midline  MOTOR:   normal bulk and tone, full strength in the BUE, BLE  SENSORY:   normal and symmetric to light touch, temperature, vibration  COORDINATION:   finger-nose-finger, fine finger movements normal  REFLEXES:   deep tendon reflexes present and symmetric  GAIT/STATION:   narrow based gait; romberg is negative    DIAGNOSTIC DATA (LABS, IMAGING, TESTING) - I reviewed patient records, labs, notes, testing and imaging myself where available.  Lab Results  Component Value Date   WBC 7.3 02/19/2017   HGB 12.5 02/19/2017   HCT 37.3 02/19/2017   MCV 92.1 02/19/2017   PLT 260 02/19/2017      Component Value Date/Time   NA 138 02/19/2017 0935   K 4.6 02/19/2017 0935   CL 101 02/19/2017 0935   CO2 28 02/19/2017 0935   GLUCOSE 112 (H) 02/19/2017 0935   BUN 14 02/19/2017 0935   CREATININE 0.87 02/19/2017 0935   CREATININE 0.82 12/27/2014 0714   CALCIUM 9.4 02/19/2017 0935   PROT 7.4 02/19/2017 0935   ALBUMIN 4.6 02/19/2017 0935   AST 28 02/19/2017 0935   ALT 21 02/19/2017 0935   ALKPHOS 69 02/19/2017 0935   BILITOT 0.4 02/19/2017 0935   GFRNONAA >60 02/19/2017 0935   GFRAA >60 02/19/2017 0935   Lab Results  Component Value Date   CHOL 174 01/02/2015   HDL 37 (L) 01/02/2015   LDLCALC 105 (H) 01/02/2015   TRIG 158 (H) 01/02/2015   CHOLHDL 4.7 01/02/2015   Lab Results  Component Value Date   HGBA1C 6.3 (H) 01/03/2015   Lab Results  Component Value Date   VITAMINB12 312 02/20/2017   Lab Results  Component Value Date   TSH 10.174 (H) 02/20/2017    02/19/17 MRI brain [I reviewed images myself and agree with interpretation. -VRP]  - Atrophy and small vessel disease.  No acute intracranial findings. - No significant progression from  2016.  08/20/15 24 hour EEG IMPRESSION: This 24-hour ambulatory EEG study is normal.   CLINICAL CORRELATION: A normal EEG does not exclude a clinical diagnosis of epilepsy.  Typical events were not captured. If further clinical questions remain, inpatient video EEG monitoring may be helpful.  05/10/17 VEEG ([facility] report) - Patient admitted. Not on any anti-seizure drugs. Sleep deprived. Had one of her typical spells of deja vu, temperature change, with associated seizure arising right temporal region. Had another more severe event with post-ictal confusion, that unfortunately was not captured on camera, but again arose right temporal. Had right temporal sharps through her whole recording indicating an increased risk of seizures.     ASSESSMENT AND PLAN  77 y.o. year old female here with suspected right temporal lobe seizures, consisting of dj vu sensation, temperature change, some memory lapse and confusion.  Also with comorbid anxiety and depression.  Meds tried: Lamictal (ineffective, side effects), oxcarbazepine (paranoia, confusion), zonisamide (tremor, anxiety), Depakote (lethargy, nausea), vimpat   Dx:  1. Partial epilepsy ([place])   2. Amnesia (retrograde)   3. Memory loss   4. Therapeutic drug monitoring      PLAN:  I spent 25 minutes of  face to face time with patient. Greater than 50% of time was spent in counseling and coordination of care with patient. This is necessary because patient's symptoms are not optimally controlled / improved. In summary we discussed: - Diagnostic results, impressions, or recommended diagnostic studies: chart review; VEEG; sz; anxiety - Prognosis: fair - Risks and benefits of management (treatment) options: med adjustment - Patient and family education: reviewed with patient's daughter    LOCALIZATION EPILEPSY (last major sz event ~Dec 2018; last minor event deja vu Oct 2019) - continue zonisamide 200mg  at bedtime (cannot tolerate higher dose) -  continue vimpat 50mg  twice a day; may consider tapering off in future - may consider referral back to [facility] epilepsy clinic for seizure mgmt (eval'd in May 2018); hold off for now  ANXIETY DISORDER + DEPRESSION - continue lexapro + lorazepam - follow up with psychiatry / psychology  Return in about 6 months (around 02/09/2019).    Penni Bombard, MD 85/88/5027, 7:41 PM Certified in Neurology, Neurophysiology and Neuroimaging  [facility] [address] [place], [place] [address] [phone]

## 2018-08-23 ENCOUNTER — Ambulatory Visit: Payer: Medicare Other | Attending: Internal Medicine

## 2018-08-23 DIAGNOSIS — E039 Hypothyroidism, unspecified: Principal | ICD-10-CM | POA: Insufficient documentation

## 2018-08-23 LAB — THYROID STIMULATING HORMONE: THYROID STIMULATING HORMONE: 0.47 u[IU]/mL (ref 0.35–3.30)

## 2018-08-30 ENCOUNTER — Ambulatory Visit: Payer: Medicare Other | Admitting: Internal Medicine

## 2018-08-30 ENCOUNTER — Encounter: Payer: Self-pay | Admitting: Internal Medicine

## 2018-08-30 VITALS — BP 130/82 | HR 71 | Temp 97.1°F | Resp 14 | Ht 66.0 in | Wt 173.9 lb

## 2018-08-30 DIAGNOSIS — E039 Hypothyroidism, unspecified: Principal | ICD-10-CM

## 2018-08-30 DIAGNOSIS — E7800 Pure hypercholesterolemia, unspecified: Secondary | ICD-10-CM

## 2018-08-30 NOTE — Nursing Note (Signed)
Tammy [name] has been identified by name, date of birth and address.This patient is accompanied by self  Since you last saw your  provider, have you gone to the emergency room/urgent care or been hospitalized?  No  Have you started any over-the-counter medications, herbal supplements, vitamins or home remedies?  No    Breonia Kirstein, MA

## 2018-08-30 NOTE — Progress Notes (Signed)
Chief Complaint: Patient presents today with follow-up on the blood test results    No symptoms of hypo or hyperthyroidism: no decreased or increased weight, no feeling cold/chilly or excessively warm, no diarrhea or constipation, no undue sweatiness, anxiety or palpitations.   Lab Results   Lab Name Value Date/Time    TSH 0.47 08/23/2018 07:42 AM    TSH 1.89 05/09/2014 09:44 AM    FT4 1.27 06/17/2009 08:16 AM        Hyperlipidemia,    management discussed with patient, shehas  been working on low fat diet, she has  been doing regular exercise. Patient verbalised understanding about the significance for regular exercise and low fat diet in management of this problem        Fall risks Assessment:   No falls in past year. No throw rugs, poorly lighted areas, missing rails/grab bars in the home. No medications or conditions that affect stability or mobility.          Ros  complete review of symptons was done all other systems are negative except as otherwise stated in HPI        Physical exam -  General Appearance: healthy, alert, no distress, pleasant affect, cooperative.  Eyes:  conjunctivae and corneas clear. PERRL, EOM's intact. sclerae normal.  Ears:  external inspection of ears show no abnormality.  Lungs: no chest deformities noted .lungs clear to auscultation.  Heart:  normal rate and regular rhythm, no murmurs,  Extremities:  no cyanosis, clubbing, or edema.    ASSESMENT AND PLAN     (E03.9) Acquired hypothyroidism  (primary encounter diagnosis)  Comment: TSH is at goal  Plan: THYROID STIMULATING HORMONE        (E78.00) Pure hypercholesterolemia  Comment:   Plan: LIPID PANEL, HEPATIC FUNCTION PANEL             I did review patient's past medical and family/social history, no changes noted.  Barriers to Learning assessed: none. Patient verbalizes understanding of teaching and instructions.  Zettie CooleyAPEET Orlan Aversa MD      Electronically Signed By:  Zettie CooleyApeet Ndea Kilroy, MD  Physician associate   [facility]  [place]  [phone]

## 2018-09-06 DIAGNOSIS — F419 Anxiety disorder, unspecified: Secondary | ICD-10-CM | POA: Diagnosis not present

## 2018-09-09 ENCOUNTER — Telehealth: Payer: Self-pay | Admitting: Diagnostic Neuroimaging

## 2018-09-09 NOTE — Telephone Encounter (Signed)
Pt is asking for a call from RN to discuss that the LORazepam (ATIVAN) 0.5 MG tablet is not helping with her anxiety worsening.  Pt is asking for a call to discuss the idea of trying something else

## 2018-09-09 NOTE — Telephone Encounter (Signed)
Called patient and advised her that Dr Leta Baptist hasn't prescribed Ativan for her. Advised she call the doctor listed on the bottle; that is the prescribing physician. Advised she discuss with  Him. She verbalized understanding, appreciation.

## 2018-09-20 ENCOUNTER — Telehealth: Payer: Self-pay | Admitting: Diagnostic Neuroimaging

## 2018-09-20 NOTE — Telephone Encounter (Signed)
Patient states 2 weeks ago she had a deja vu seizure brought on by someone's body odor. Yesterday she had a small seizure brought on by smelling a cooked ham. Please call and discuss.

## 2018-09-20 NOTE — Telephone Encounter (Signed)
We can increase vimpat to 100mg  twice a day. -VRP

## 2018-09-20 NOTE — Addendum Note (Signed)
Addended by: Minna Antis on: 09/20/2018 04:15 PM   Modules accepted: Orders

## 2018-09-20 NOTE — Telephone Encounter (Signed)
Spoke with patient and informed her that Dr Leta Baptist stated he can increase [name] to 100 mg twice a day. She is agreeable to this increase and requested new Rx be sent to [facility]. This RN advise dher the new Rx will be sent. She verbalized understanding, appreciation.

## 2018-09-20 NOTE — Telephone Encounter (Addendum)
Spoke with patient who stated she's taking seizure medications as prescribed and hasn't been sick. Her psychiatrist hasn't added to or changed any current medications.  She stated she has deja vu seizures that "can't be seen". She feels a deja vu throughout her body with heat/flushed feeling then is very fatigued, and sleeps a lot which is a new symptom. She stated one of the seizures was shorter than the other.  She has talked to her psychiatrist who advised she let Dr Leta Baptist know.  This RN advised will discuss with Dr Leta Baptist and let her know of his reply. She verbalized understanding, appreciation.

## 2018-09-21 MED ORDER — LACOSAMIDE 100 MG PO TABS: 100.0000 mg | ORAL_TABLET | Freq: Two times a day (BID) | ORAL | 5 refills | Status: DC

## 2018-09-21 NOTE — Telephone Encounter (Signed)
Pt requesting a call stating that her bill for medication [name] went up to over $200 requesting a call to see if there is anyway to lower the price.

## 2018-09-21 NOTE — Telephone Encounter (Addendum)
Called [employer] [place], spoke with Misty to discuss lowering cost of vimpat. She stated the patient has reached the coverage gap with her insurance. No medications are covered until her insurance renews next year.  She advised calling customer service to see if any assistance is available until the new year.  Called cust service, spoke with Charm who advised this RN speak with care management dept [phone] opt [phone],  for tier exception, and she transferred call.   Spoke with Tameka to answer clinical questions for tier exception. Information will be sent to pharmacist for review. Decision will be faxed.

## 2018-09-21 NOTE — Addendum Note (Signed)
Addended by: Andrey Spearman R on: 09/21/2018 09:09 AM   Modules accepted: Orders

## 2018-09-23 NOTE — Telephone Encounter (Signed)
Called [employer] to check status of vimpat PA, spoke with Sterling Big. She stated it was denied a tier exception because patient has not tried/failed two of formulary drugs. She advised an appeal can be sent to their  [place]  fax [phone], cover sheet to include Members name, dob, id #.  Send additional supporting documents and appeal letter. This RN thanked her for information.

## 2018-09-26 ENCOUNTER — Encounter: Payer: Self-pay | Admitting: *Deleted

## 2018-09-26 NOTE — Telephone Encounter (Addendum)
Appeal letter, office notes, allergy list faxed to [employer], # [phone].  Called patient and advised her of above appeal sent in today.  Asked how she has been feeling. She stated she has been fine. This RN advised will call her when [employer] sends their decision. Advised she call for any further events. She verbalized understanding, appreciation.

## 2018-09-26 NOTE — Telephone Encounter (Signed)
Appeal letter for [name] 100 mg on Dr [employer] desk for review, signature.

## 2018-09-27 ENCOUNTER — Telehealth: Payer: Self-pay | Admitting: Diagnostic Neuroimaging

## 2018-09-27 NOTE — Telephone Encounter (Signed)
Pt requesting a call discuss medication LORazepam (ATIVAN) 0.5 MG tablet stating Dr. Melvyn Novas will no long prescribe medication. Also stating she is almost out of meds. Please advise

## 2018-09-27 NOTE — Telephone Encounter (Signed)
She should discuss with psychiatry. -VRP

## 2018-09-28 ENCOUNTER — Ambulatory Visit (INDEPENDENT_AMBULATORY_CARE_PROVIDER_SITE_OTHER): Payer: No Typology Code available for payment source | Admitting: Psychology

## 2018-09-28 DIAGNOSIS — F4323 Adjustment disorder with mixed anxiety and depressed mood: Secondary | ICD-10-CM | POA: Diagnosis not present

## 2018-09-28 NOTE — Telephone Encounter (Signed)
Spoke with patient who stated her psychiatrist was prescribing Ativan and Lexapro. Her psychiatrist told the patient he would no longer prescribe them. She asked if Dr Leta Baptist would. This RN advised her that he manages her epilepsy and it's medications. However he would not treat her for her depression and anxiety. These are not neurological issues. Advised she talk to her PCP.  She verbalized understanding, appreciation. This RN advised her also that there is not a decision form her insurance yet re: [name]. This RN will check on it if no decision back by tomorrow.

## 2018-09-29 ENCOUNTER — Encounter: Payer: Self-pay | Admitting: *Deleted

## 2018-09-29 NOTE — Telephone Encounter (Signed)
Called [employer] [place] to check on status of [name] PA. Spoke with Sharyn Lull who checked records and stated fax was never received. This RN stated a confirmation of fax received by [employer] came; this was on 09/26/18. Sharyn Lull advised to refax to same #, confirmed this office's phone and fax #.  She stated to include previous confirmation of fax on 09/26/18 so that [place] appeals knows this should have already been in process. She advised this office may get a call from appeals dept for additional information. This RN thanked her for her assistance and refaxed all documents with "Expedited" request on fax cover sheet.  Notified patient of this through my chart message.

## 2018-09-30 NOTE — Telephone Encounter (Signed)
Pt requesting a call to discuss medication [name]- did not wish to discuss further with me

## 2018-09-30 NOTE — Telephone Encounter (Signed)
I spoke to daughter of pt, ms troutman.  Since starting the vimpat 100mg  po bid, she has not increased problems with her memory and increased dizziness. Thinking SE of medication.   She started taking the increase on 09/21/2018.  It has helped her deja vu auras, no other sx, but has noted her borderline depression has caused her to not leave her bedroom, stay at home.  Please advise on dosing?  Wants call back today.

## 2018-09-30 NOTE — Telephone Encounter (Signed)
I spoke to Dr. Leta Baptist and he stated pt to go back to 50mg  po bid and see if sx subside and then if auras continue may need to change to another medication.  I spoke to pt and she will 1/2 tablet BID of her 100mg  tablets, (I asked Evelena Peat at pharmacy if ok to do and she said it was).  Pt to let us know how she does.

## 2018-10-04 NOTE — Telephone Encounter (Signed)
Called patient to see ow she is feeling. She stated she is feeling better. She was able to get [name] 100 mg tabs. She continues to take [name] 50 mg 2 x daily per Dr Leta Baptist. This RN advised she stay on that dose and call for any auras, side effects, concerns or questions.  She verbalized understanding, appreciation of call.

## 2018-10-06 NOTE — Telephone Encounter (Signed)
Received fax from [employer] [place] re: [name] 100 mg tabs appeal denied. "Tier exception requirements have not been met". However at this time the patient has reduced her dose to 50 mg twice daily per Dr Leta Baptist, due to possible side effects from higher dose.

## 2018-10-11 DIAGNOSIS — J0100 Acute maxillary sinusitis, unspecified: Secondary | ICD-10-CM | POA: Diagnosis not present

## 2018-10-11 DIAGNOSIS — R05 Cough: Secondary | ICD-10-CM | POA: Diagnosis not present

## 2018-10-28 DIAGNOSIS — R413 Other amnesia: Secondary | ICD-10-CM | POA: Diagnosis not present

## 2018-10-28 DIAGNOSIS — F419 Anxiety disorder, unspecified: Secondary | ICD-10-CM | POA: Diagnosis not present

## 2018-10-28 DIAGNOSIS — J40 Bronchitis, not specified as acute or chronic: Secondary | ICD-10-CM | POA: Diagnosis not present

## 2018-11-15 DIAGNOSIS — R1012 Left upper quadrant pain: Secondary | ICD-10-CM | POA: Diagnosis not present

## 2018-11-16 ENCOUNTER — Other Ambulatory Visit: Payer: Self-pay | Admitting: *Deleted

## 2018-11-16 ENCOUNTER — Other Ambulatory Visit: Payer: Self-pay | Admitting: Internal Medicine

## 2018-11-16 MED ORDER — LEVOTHYROXINE 112 MCG TABLET
ORAL_TABLET | ORAL | 2 refills | Status: DC
Start: 2018-11-16 — End: 2019-01-25

## 2018-11-16 NOTE — Patient Outreach (Signed)
[facility]) Care Management  11/16/2018  Suzanne Galloway 15-Aug-1941 [id number]   Pt request for home visit. Met today with Suzanne Galloway and her daughter Suzanne Galloway with whom Suzanne Galloway resides.  Suzanne Galloway reports she had been sick for about 6 weeks with some kind of virus that took her forever to get over. Her sxs were respiratory, coughing, without fever.  [facility], they gave her amoxicillin and then she went to Dr. Laurann Montana when she did not get better. She is better now.  She says when Dr. Laurann Montana did her PE and palpated her stomach, it was painful. He is going to order some diagnostic testing for this.  She states she wishes she could get off some of these meds. She states she never had any "seiziures" before taking Dulera and then that is when she started getting the "Dejavu" feelings. She states that was stopped and symbicort was started and she had 2 episodes right after that change. She states Dr. Melvyn Novas evaluated her for Asthma and COPD and told her she did not have either one.   Suzanne Galloway also expresses anxiety and fear about developing Parkinson's Disease. Her mother had this and she has some of the same sxs, specifically her hand writing has gotten worse, her voice has changed, she has a minor tremor in her hands. She does report very poor memory. She does not sleep well. She denies any recent falls. Her depression is controlled.  Outpatient Encounter Medications as of 11/16/2018  Medication Sig Note  . Calcium Carbonate-Vitamin D (CALCIUM 600+D) 600-400 MG-UNIT tablet Take 1 tablet by mouth daily.   Marland Kitchen escitalopram (LEXAPRO) 20 MG tablet Take 20 mg by mouth daily.   . Lacosamide 100 MG TABS Take 1 tablet (100 mg total) by mouth 2 (two) times daily. (Patient taking differently: Take 50 mg by mouth 2 (two) times daily. )   . levothyroxine (SYNTHROID, LEVOTHROID) 88 MCG tablet Take 88 mcg by mouth daily before breakfast.   . LORazepam (ATIVAN) 0.5 MG tablet Take 1 tablet by mouth  every 8 (eight) hours as needed.  04/11/2018: 03/28/18 Increased to 3 x day by Dr Lilia Pro, psychiatrist  . metoprolol succinate (TOPROL-XL) 50 MG 24 hr tablet Take 50 mg by mouth daily. Take with or immediately following a meal.   . Multiple Vitamins-Minerals (PRESERVISION AREDS PO) Take by mouth.   Marland Kitchen omeprazole (PRILOSEC) 40 MG capsule TAKE ONE CAPSULE BY MOUTH DAILY BEFORE BREAKFAST   . zonisamide (ZONEGRAN) 100 MG capsule Take 3 capsules (300 mg total) by mouth at bedtime. (Patient taking differently: Take 200 mg by mouth at bedtime. ) 06/13/2018: PT is taking this but the dose has been reduced by neurology to only 200 mg at night.  . [DISCONTINUED] cetirizine (ZYRTEC) 10 MG tablet Take 10 mg by mouth daily as needed for allergies.    . [DISCONTINUED] famotidine (PEPCID) 20 MG tablet One at bedtime (Patient not taking: Reported on 11/16/2018) 06/11/2017: Takes just as needed.   No facility-administered encounter medications on file as of 11/16/2018.    Objective:  VS:  Pulse 86, Pulse Ox 98%, Resp 18, BP 130/50 Alert and oriented X3, minimal hand tremor, no cogwheel rigidity, gait is normal, voice seems normal today. RRR Lungs are clear  A: Anxiety     Epigastric tenderness     Polypharmacy     Minor hand tremor  P: Investigate drug interactions and longevity of seizure disorders after use of LABA inhalers.  Listened to and supported pt.     Will stay in contact with pt and keep her informed of any significant findings.     Pt to advise me of any changes in her condition.     Consider reducing some of her medications that have been added since she began having the "seizures" with primary and neurology oversignt.  [name]. Myrtie Neither, MSN, [facility] Gerontological Nurse Practitioner [facility] Care Management [phone]

## 2018-11-17 ENCOUNTER — Other Ambulatory Visit: Payer: Self-pay

## 2018-11-17 NOTE — Patient Outreach (Addendum)
[facility]) Care Management  [place]   11/17/2018  Suzanne Galloway 05-26-1941 [id number]  Reason for referral: medication review and drug information  Referral source: Deloria Lair, GNP-BC  PMHx includes but not limited to: mitral valve prolapse, hypertension, GERD, hypothyroidism, partial epilepsy, depression and hyperlipidemia  Outreach:  Successful telephone call with Suzanne Galloway.  HIPAA identifiers verified.   Subjective:  Suzanne Galloway states that all her problems started a few years ago after she got hit on the head with frozen meat that fell on her while cleaning out her freezer.  She states that this gave her a concussion.  Shortly thereafter, she reports that she was started on [facility] and within a couple of days she had her first seizure.  She states that [facility] makes her "goofy" and "effects me mentally".  She reports her seizures as feelings of deja vu with hot flashes that last for seconds.  She states that she had been off her inhalers Ruthe Mannan and Symbicort) for about a year until recently.  She reports that she was sick around Christmas, so she used her Symbicort inhaler for~ 2 days and again experienced seizures.  She reports that she has been seizure free since stopping Symbicort.    Objective: Lab Results  Component Value Date   CREATININE 0.87 02/19/2017   CREATININE 0.86 01/03/2015   CREATININE 0.92 01/02/2015    Lab Results  Component Value Date   HGBA1C 6.3 (H) 01/03/2015    Lipid Panel     Component Value Date/Time   CHOL 174 01/02/2015 0559   TRIG 158 (H) 01/02/2015 0559   HDL 37 (L) 01/02/2015 0559   CHOLHDL 4.7 01/02/2015 0559   VLDL 32 01/02/2015 0559   LDLCALC 105 (H) 01/02/2015 0559    BP Readings from Last 3 Encounters:  08/10/18 (!) 104/58  04/13/18 128/73  02/22/18 118/80    Allergies  Allergen Reactions  . Lamictal [Lamotrigine] Other (See Comments)    Increased siezure activity.  . Oxcarbazepine Other (See Comments)     Over sedation  . Atorvastatin   . Buspirone     Increased depression  . Carbamazepine Other (See Comments)    Paranoia, confusion  . Depakote [Divalproex Sodium] Nausea And Vomiting    lethargy  . Dilaudid [Hydromorphone] Other (See Comments)    Reaction:  Hallucinations   . Dulera [Mometasone Furo-Formoterol Fum] Other (See Comments)    Reaction:  Deja vu seizures   . Lactose Intolerance (Gi) Diarrhea  . Morphine And Related Nausea And Vomiting  . Other     Zonisamide- in higher doses caused tremors, anxiety  . Percocet [Oxycodone-Acetaminophen] Other (See Comments)    Reaction:  Hallucinations   . Pravastatin Other (See Comments)    Reaction:  Muscle pain, "weird dreams"  . Vesicare [Solifenacin] Other (See Comments)    Reaction:  Constipation     Medications Reviewed Today    Reviewed by Dionne Milo, [facility] (Pharmacist) on 11/17/18 at 1323  Med List Status: <None>  Medication Order Taking? Sig Documenting Provider Last Dose Status Informant  Calcium Carbonate-Vitamin D (CALCIUM 600+D) 600-400 MG-UNIT tablet [id number] Yes Take 1 tablet by mouth daily. [provider] Taking Active Self  escitalopram (LEXAPRO) 20 MG tablet [id number] Yes Take 20 mg by mouth daily. [provider] Taking Active   Lacosamide 100 MG TABS [id number] Yes Take 1 tablet (100 mg total) by mouth 2 (two) times daily.  Patient taking differently:  Take 50 mg  by mouth 2 (two) times daily.    Penumalli, Earlean Polka, MD Taking Active   levothyroxine (SYNTHROID, LEVOTHROID) 88 MCG tablet [id number] Yes Take 88 mcg by mouth daily before breakfast. [provider] Taking Active   LORazepam (ATIVAN) 0.5 MG tablet [id number] Yes Take 1 tablet by mouth every 8 (eight) hours as needed. 11/17/18 pt reports taking twice daily. [provider] Taking Active            Med Note (Clytee Heinrich, Anderson Malta D   Thu Nov 17, 2018  1:21 PM)    metoprolol succinate (TOPROL-XL) 50 MG 24 hr  tablet [id number] Yes Take 50 mg by mouth daily. Take with or immediately following a meal. [provider] Taking Active Self  Multiple Vitamins-Minerals (PRESERVISION AREDS PO) [id number] Yes Take 2 tablets by mouth daily.  [provider] Taking Active   omeprazole (PRILOSEC) 40 MG capsule [id number] Yes TAKE ONE CAPSULE BY MOUTH DAILY BEFORE Hulda Humphrey, MD Taking Active   zonisamide (ZONEGRAN) 100 MG capsule [id number] Yes Take 3 capsules (300 mg total) by mouth at bedtime.  Patient taking differently:  Take 200 mg by mouth at bedtime.    Dennie Bible, NP Taking Active            Med Note Myrtie Neither, CARRO[place]   Mon Jun 13, 2018 10:58 AM) PT is taking this but the dose has been reduced by neurology to only 200 mg at night.          Assessment:  Drugs sorted by system:  Neurologic/Psychologic: escitalopram, lacosamide, lorazepam, zonisamide   Cardiovascular: metoprolol succinate  Gastrointestinal: omeprazole  Endocrine: levothroxine  Vitamins/Minerals/Supplements: calcium/vitamin D, Preservision Areds  Medication Review Findings:  . Per the Beers List, lorazepam is highly anticholinergic and clearance is reduced with advanced age. Risk of confusion, dry mouth, constipation and other anticholinergic effects or toxicity may occur.  There is strong evidence to avoid use in the elderly. Marland Kitchen LABA-Both Dulera and Symbicort contain formoterol.  While seizure is listed as a possible reason to avoid use due to the potential for CNS stimulation, I am unable to find any incidence rates for seizures caused by beta-2 agonists.  Per Dr. Gustavus Bryant notes, 2014 pt. on Symbicort, 01/24/15-02/18/15 on Dulera, 12/06/15 changed to Symbicort due to seizures, 01/11/18 patient on Symbicort.  His note states that she was using albuterol during this time and that she was experiencing tremor.  He notes she can change Symbicort to prn.  The half life of formoterol is ~ 10 hours and she would  not have been at drug steady state if she only used it a couple of days.       Plan: Ms. Galloway requests that I call her back next week to further discuss.  She wants to review with her daughter this weekend.   Joetta Manners, PharmD Clinical Pharmacist [place] [phone]

## 2018-11-21 ENCOUNTER — Other Ambulatory Visit: Payer: Self-pay

## 2018-11-21 ENCOUNTER — Ambulatory Visit: Payer: No Typology Code available for payment source | Admitting: Psychology

## 2018-11-21 ENCOUNTER — Ambulatory Visit: Payer: Self-pay

## 2018-11-21 ENCOUNTER — Other Ambulatory Visit: Payer: Self-pay | Admitting: Diagnostic Neuroimaging

## 2018-11-21 NOTE — Patient Outreach (Signed)
[facility]) Care Management  [place]  11/21/2018  Suzanne Galloway [birth year] [id number]  Reason for call: follow up medication related questions  Unsuccessful telephone call attempt # 1 to patient.   Unable to leave message.  Voice mail did not pick up.  Plan:  I will make another outreach attempt to patient within 3-4 business days.  Joetta Manners, PharmD Clinical Pharmacist [place] [phone]

## 2018-11-22 ENCOUNTER — Telehealth: Payer: Self-pay | Admitting: *Deleted

## 2018-11-22 NOTE — Telephone Encounter (Signed)
Dr Leta Baptist completed [name] Rx, faxed to [facility].

## 2018-11-22 NOTE — Telephone Encounter (Signed)
Received [name] 50 mg refill request form [facility]. Called, spoke with Lattie Haw and advised her Dr Leta Baptist refilled Vimapt 09/21/18. She stated the patient is taking 50 mg twice daily, so that is why the request was sent. I noted a call with patient 10/04/18, and she cut back to 50 mg twice daily, approved by Dr Leta Baptist. This was due to side effects. I advised Lattie Haw I will call patient to check her status on lower dose then proceed as indicated. Lattie Haw verbalized understanding. Called patient and asked how she is doing on [name] 50 mg twice daily. She said she has been fine. Her pharmacy cut 100 mg tabs in half for her, but the pharmacist advised her Rx be changed to [name] 50 mg.  I advised her I 'll let Dr Leta Baptist know, and new Rx will be taken care of. Patient verbalized understanding, appreciation.

## 2018-11-25 ENCOUNTER — Other Ambulatory Visit: Payer: Self-pay

## 2018-11-25 ENCOUNTER — Ambulatory Visit: Payer: Self-pay

## 2018-11-25 DIAGNOSIS — K580 Irritable bowel syndrome with diarrhea: Secondary | ICD-10-CM | POA: Diagnosis not present

## 2018-11-25 DIAGNOSIS — R1012 Left upper quadrant pain: Secondary | ICD-10-CM | POA: Diagnosis not present

## 2018-11-25 NOTE — Patient Outreach (Signed)
[facility]) Care Management  11/25/2018  NORIE LATENDRESSE 1941/09/04 [id number]  Successful outreach call to Ms. Zahm and her daughter Perley Jain.  HIPAA identifiers verified.   Daughter, [name] reports that her mom is having diarrhea after each meal that is very foul smelling.  She feels like it is getting worse.  She states that she saw the PCP last week and was told that if not better in 10 days to make another appointment.  Call placed to PCP, Dr. Laurann Montana.  He has an opening and they were told to come in.    Plan: Will follow up with patient this afternoon.   Joetta Manners, PharmD Clinical Pharmacist [place] [phone]  Successful outreach call to daughter Perley Jain.  HIPAA identifiers verified.  Daughter reports that her mom saw Dr. Laurann Montana today.  She states that she did not have have any rectal bleeding, was tested for H. Pylori (awaiting results) and will start taking a probiotic to see if that helps.  She states that her mom has not had anymore "deja vu spells" since stopping Symbicort.  Formoterol was added to her allergy list  after patient has had seizures after using Dulera and Symbicort.   Daughter, [name] reports that her mom doesn't have any other medication related questions or concerns at this time. I will close her [place] case.  She has my phone number and is aware she can call me in the future should medication issues arise.  Plan: Route case closure letter to PCP, Dr. Laurann Montana.  Joetta Manners, PharmD Clinical Pharmacist [place] [phone]

## 2018-12-07 DIAGNOSIS — F419 Anxiety disorder, unspecified: Secondary | ICD-10-CM | POA: Diagnosis not present

## 2019-01-02 ENCOUNTER — Telehealth: Payer: Self-pay | Admitting: Neurology

## 2019-01-02 NOTE — Telephone Encounter (Signed)
Pt called to make sure that she was on the schedule still for the 18th and would like to be called at home phone num if any changes will be made to her appt due to the fact that she is 80ys old. Please advise.

## 2019-01-02 NOTE — Telephone Encounter (Signed)
I called pt, explained that Dr. Rexene Alberts is recommending that anyone over 7 or anyone with immunosuppression consider postponing their appt to avoid possible exposure to virus. Pt is agreeable to this and her appt was rescheduled for 02/16/2019 at 11:00am. Pt verbalized understanding of new appt date and time.

## 2019-01-04 ENCOUNTER — Institutional Professional Consult (permissible substitution): Payer: Medicare Other | Admitting: Neurology

## 2019-01-10 DIAGNOSIS — A09 Infectious gastroenteritis and colitis, unspecified: Secondary | ICD-10-CM | POA: Diagnosis not present

## 2019-01-23 ENCOUNTER — Telehealth: Payer: Self-pay | Admitting: Neurology

## 2019-01-23 NOTE — Telephone Encounter (Addendum)
I called pt. Her meds, allergies, and PMH were updated.  Pt has had a sleep study about 4 years ago but was never placed on a cpap. Pt does endorse snoring.  Pt was instructed on how to measure her neck size and will have this measurement completed prior to her appt on Wednesday.  Pt's recent weight is 128 lbs and she is 5'2.5.  Epworth Sleepiness Scale 0= would never doze 1= slight chance of dozing 2= moderate chance of dozing 3= high chance of dozing  Sitting and reading: 1 Watching TV: 1 Sitting inactive in a public place (ex. Theater or meeting): 0 As a passenger in a car for an hour without a break: 0 Lying down to rest in the afternoon: 2 Sitting and talking to someone: 0 Sitting quietly after lunch (no alcohol): 0 In a car, while stopped in traffic: 0 Total: 4  FSS: 22

## 2019-01-23 NOTE — Telephone Encounter (Signed)
Due to current COVID 19 pandemic, our office is severely reducing in office visits for at least the next 2 weeks, in order to minimize the risk to our patients and healthcare providers.   Called patient and was given consent to schedule a virtual visit. I spoke with patient's daughter [name] and walked her through the steps of downloading the app. I advised her to check her e-mail for an e-mail from [place] with further instructions. She verbalized understanding.  Pt understands that although there may be some limitations with this type of visit, we will take all precautions to reduce any security or privacy concerns.  Pt understands that this will be treated like an in office visit and we will file with pt's insurance, and there may be a patient responsible charge related to this service.  Pt's email is [email or web address]. Pt understands that the cis[employer] webex software must be downloaded and operational on the device pt plans to use for the visit.

## 2019-01-23 NOTE — Addendum Note (Signed)
Addended by: Lester Russell A on: 01/23/2019 10:57 AM   Modules accepted: Orders

## 2019-01-25 ENCOUNTER — Other Ambulatory Visit: Payer: Self-pay

## 2019-01-25 ENCOUNTER — Encounter: Payer: Self-pay | Admitting: Neurology

## 2019-01-25 ENCOUNTER — Ambulatory Visit (INDEPENDENT_AMBULATORY_CARE_PROVIDER_SITE_OTHER): Payer: Medicare Other | Admitting: Neurology

## 2019-01-25 ENCOUNTER — Other Ambulatory Visit: Payer: Self-pay | Admitting: Internal Medicine

## 2019-01-25 DIAGNOSIS — E785 Hyperlipidemia, unspecified: Secondary | ICD-10-CM

## 2019-01-25 DIAGNOSIS — R351 Nocturia: Secondary | ICD-10-CM

## 2019-01-25 DIAGNOSIS — Z8669 Personal history of other diseases of the nervous system and sense organs: Secondary | ICD-10-CM

## 2019-01-25 DIAGNOSIS — G4719 Other hypersomnia: Secondary | ICD-10-CM

## 2019-01-25 DIAGNOSIS — G479 Sleep disorder, unspecified: Secondary | ICD-10-CM | POA: Diagnosis not present

## 2019-01-25 NOTE — Progress Notes (Signed)
Star Age, MD, PhD [facility] [address] [place], [place] [address]   Virtual Visit via Video Note on 01/25/2019:  I connected with [name] on 01/25/19 at 11:00 AM EDT by a video enabled telemedicine application and verified that I am speaking with the correct person using two identifiers.   I discussed the limitations of evaluation and management by telemedicine and the availability of in person appointments. The patient expressed understanding and agreed to proceed.  History of Present Illness:  [name] is a 78 year-old right-handed woman with an underlying medical history of partial epilepsy, hypertension, hyperlipidemia, hypothyroidism, mitral valve prolapse, reflux disease, depression, anxiety, arthritis, asthma, with whom I am conducting a virtual, video based new patient visit via Webex in lieu of a face-to-face visit for evaluation of her sleep disorder, in particular, evaluation of her daytime somnolence. The patient is accompanied by her daughter, Suzanne Galloway today and joins via iPad from home. She is referred by her psychiatry nurse practitioner, Sharlotte Alamo, and I reviewed her note from 09/13/2018. Her Epworth sleepiness score is 4 out of 24, fatigue severity score is 22 out of 63. She had sleep study testing about 4 years ago. Prior sleep study results are not available for my review today. She has not been on CPAP therapy. I reviewed outside records from [facility], it looks like she did not actually have a sleep study, she had video EEG admission in July 2018. They were concerns for right-sided seizures. She has seen Dr. Carles Collet and she has subsequently seen Dr. Leta Baptist for seizure disorder, in October 2019. Of note, she is on several potentially sedating medications including Ativan, Zonegran, Lexapro and Lacosamide. Her main concern is potentially having seizures in her sleep as she is tired during the day. Interestingly, recently  in the past few weeks she actually feels improved in that regard. She no longer sees the psychiatrypractitioner she reports. She lives with her daughter Suzanne Galloway and Suzanne Galloway's husband. Her other daughter has sleep apnea but is not on a CPAP machine as I understand, she has a total of 3 daughters. She is a nonsmoker and utilizes caffeine daily in the form of soda 1-2 per day and coffee, about 2 cups per day. She may not always hydrate well enough, estimates that she drinks about 2 bottles of water per day. She does not snore very consistently, per Suzanne Galloway, patient snores rarely. She has nocturia about 3 times per average night, denies morning headaches and headaches in general, has a TV in the bedroom but does not watch it at night. She is widowed, husband passed away 7 years ago. She has 2 cats, 1 cat in the bedroom on her bed at night. The time around 8:30 and rise time around 6.     Her Past Medical History Is Significant For: Past Medical History:  Diagnosis Date  . Anxiety   . Arthritis    "hands, back, toes" (01/01/2015)  . Asthma   . Chronic bronchitis ([place])    "get it just about q yr" (01/01/2015)  . Depression   . GERD (gastroesophageal reflux disease)   . High cholesterol   . Hyperlipemia   . Hypertension   . Hypothyroidism   . Mitral valve prolapse   . Seizures [facility])    dx Aug 2018, most recent seizure 10/13/17  . Urinary incontinence     Her Past Surgical History Is Significant For: Past Surgical History:  Procedure Laterality Date  .  BACK SURGERY    . CARPAL TUNNEL RELEASE Bilateral   . CYSTECTOMY Right 09/2014   "hand"   . DILATION AND CURETTAGE OF UTERUS    . FINGER SURGERY Bilateral    "scraped arthritis out of thumbs"  . INCONTINENCE SURGERY    . LAPAROSCOPIC CHOLECYSTECTOMY    . LUMBAR LAMINECTOMY  1987; 12/2009   ; [facility] Endo 01/16/2010  . TOE SURGERY Left    "took bone out of 2nd toe; took cyst out"  . TUBAL LIGATION    . VAGINAL HYSTERECTOMY  1970    Her Family History Is  Significant For: Family History  Problem Relation Age of Onset  . COPD Father        smoked  . Heart disease Father   . Esophageal cancer Father        smoked  . Transient ischemic attack Father   . Cancer Father        kidney  . Deep vein thrombosis Brother   . Parkinson's disease Mother   . Cancer Daughter     Her Social History Is Significant For: Social History   Socioeconomic History  . Marital status: Widowed    Spouse name: Not on file  . Number of children: 3  . Years of education: Not on file  . Highest education level: Not on file  Occupational History  . Occupation: Retired    Comment: [employer] shop  Social Needs  . Financial resource strain: Not on file  . Food insecurity:    Worry: Not on file    Inability: Not on file  . Transportation needs:    Medical: Not on file    Non-medical: Not on file  Tobacco Use  . Smoking status: Never Smoker  . Smokeless tobacco: Never Used  Substance and Sexual Activity  . Alcohol use: Yes    Comment: 01/01/2015 "mixed drink q 2 months or so"  . Drug use: No  . Sexual activity: Not Currently  Lifestyle  . Physical activity:    Days per week: Not on file    Minutes per session: Not on file  . Stress: Not on file  Relationships  . Social connections:    Talks on phone: Not on file    Gets together: Not on file    Attends religious service: Not on file    Active member of club or organization: Not on file    Attends meetings of clubs or organizations: Not on file    Relationship status: Not on file  Other Topics Concern  . Not on file  Social History Narrative   Lives with daughter, Suzanne Galloway   Retired, widowed   Education- high school   Caffeine 2 cups daily    Her Allergies Are:  Allergies  Allergen Reactions  . Lamictal [Lamotrigine] Other (See Comments)    Increased siezure activity.  . Oxcarbazepine Other (See Comments)    Over sedation  . Symbicort [Budesonide-Formoterol Fumarate] Other (See  Comments)    Had 2 "seizures" after taking.   . Atorvastatin   . Buspirone     Increased depression  . Carbamazepine Other (See Comments)    Paranoia, confusion  . Depakote [Divalproex Sodium] Nausea And Vomiting    lethargy  . Dilaudid [Hydromorphone] Other (See Comments)    Reaction:  Hallucinations   . Dulera [Mometasone Furo-Formoterol Fum] Other (See Comments)    Reaction:  Deja vu seizures   . Lactose Intolerance (Gi) Diarrhea  . Morphine And  Related Nausea And Vomiting  . Other     Zonisamide- in higher doses caused tremors, anxiety  . Percocet [Oxycodone-Acetaminophen] Other (See Comments)    Reaction:  Hallucinations   . Pravastatin Other (See Comments)    Reaction:  Muscle pain, "weird dreams"  . Vesicare [Solifenacin] Other (See Comments)    Reaction:  Constipation   :   Her Current Medications Are:  Outpatient Encounter Medications as of 01/25/2019  Medication Sig  . Calcium Carbonate-Vitamin D (CALCIUM 600+D) 600-400 MG-UNIT tablet Take 1 tablet by mouth daily.  Marland Kitchen escitalopram (LEXAPRO) 20 MG tablet Take 20 mg by mouth daily.  . Lacosamide 100 MG TABS Take 1 tablet (100 mg total) by mouth 2 (two) times daily. (Patient taking differently: Take 50 mg by mouth 2 (two) times daily. )  . levothyroxine (SYNTHROID, LEVOTHROID) 88 MCG tablet Take 88 mcg by mouth daily before breakfast.  . LORazepam (ATIVAN) 0.5 MG tablet Take 1 tablet by mouth every 8 (eight) hours as needed. 11/17/18 pt reports taking twice daily.  . metoprolol succinate (TOPROL-XL) 50 MG 24 hr tablet Take 50 mg by mouth daily. Take with or immediately following a meal.  . Multiple Vitamins-Minerals (PRESERVISION AREDS PO) Take 2 tablets by mouth daily.   Marland Kitchen omeprazole (PRILOSEC) 20 MG capsule Take 20 mg by mouth 2 (two) times daily before a meal.  . zonisamide (ZONEGRAN) 100 MG capsule Take 3 capsules (300 mg total) by mouth at bedtime. (Patient taking differently: Take 200 mg by mouth at bedtime. )   No  facility-administered encounter medications on file as of 01/25/2019.   :   Review of Systems:  Out of a complete 14 point review of systems, all are reviewed and negative with the exception of these symptoms as listed below:  Observations/Objective:  Her most recent vital signs for my review are from 10/11/2018 when [facility]: Blood pressure 104/60, pulse 62, temperature 98, respirations 14, oxygen saturation 97%, weight 129 pounds for BMI of 23.59. Her most recent weight by self-report is126 pounds, neck circumference by self-report is 14 inches. She is in no acute distress, pleasant, conversant, speech is clear, no dysarthria and no voice tremor noted. She has no head or neck tremor. Extraocular movements are preserved. Airway examination shows mouth dryness, tongue protrudes centrally and palate elevates symmetrically, Mallampati pierced to be class I, tonsils are not fully visualized, tip of uvula not fully seen. Facial animation is normal. Coordination with hand movements unremarkable, finger to nose unremarkable, slight difficulty with the left with slight past pointing noted but no intention tremor. She has no drift, no postural or action tremor. Romberg is negative. Walking is unremarkable, preserved arm swing noted.  Assessment and Plan: Ms. Dreyah Montrose is a 78 year-old right-handed woman with an underlying medical history of partial epilepsy, hypertension, hyperlipidemia, hypothyroidism, mitral valve prolapse, reflux disease, depression, anxiety, arthritis, asthma, with whom I am conducting a virtual, video based new patient visit via Webex in lieu of a face-to-face visit for evaluation of an underlying organic sleep disorder. Her biggest concern is possibly having seizures in her sleep, her daytime somnolence has actually improved recently. I explained to the patient and her daughter that evaluation with a sleep study currently would be very limited, we are  not offering any in lab studies currently. If anything she may benefit from a by mouth EEG evaluation and hospitalization electively for that if deemed necessary, she is encouraged to talk  to Dr. Leta Baptist about this at her next visit which is scheduled in July. From the sleep standpoint, currently we can only offer home sleep testing which is really only cured towards evaluation for underlying obstructive sleep apnea. We mutually agreed that her sleep apnea risk is rather small. She does not have any telltale symptoms, body size is slender, neck is slender. No witnessed apneas are reported, snoring is minimal. We could potentially reconvene if necessary and consider a laboratory attended sleep study down the [place]. This would be a more comprehensive evaluation of her sleep and would allow [place] to monitor brain electrical activity and video. She is encouraged to maintain good sleep hygiene, stay well hydrated, well rested, she could do a little better with her water intake, she agrees. She is encouraged to continue her antiepileptic medication and keep her follow-up appointment with Dr. Leta Baptist to discuss further and consider video EEG monitoring if necessary.  I will see her in follow-up if needed. I answered all the questions today and the patient and her daughter were in agreement.  Star Age, MD, PhD    Follow Up Instructions: 1. Keep follow-up appointment with Dr. Leta Baptist, discuss repeating video EEG monitoring versus in lab sleep study which we could consider down the road. 2. Keep good sleep hygiene, stay better hydrated with water. 3. Follow-up with me as needed.   I discussed the assessment and treatment plan with the patient. The patient was provided an opportunity to ask questions and all were answered. The patient agreed with the plan and demonstrated an understanding of the instructions.   The patient was advised to call back or seek an in-person evaluation if the symptoms worsen or if  the condition fails to improve as anticipated.  I provided 30 minutes of non-face-to-face time during this encounter.   Star Age, MD

## 2019-01-25 NOTE — Patient Instructions (Signed)
Given verbally, during today's virtual video-based encounter, with verbal feedback received.   

## 2019-01-26 MED ORDER — LEVOTHYROXINE 112 MCG TABLET
ORAL_TABLET | ORAL | 2 refills | Status: DC
Start: 2019-01-26 — End: 2019-03-29

## 2019-01-26 MED ORDER — ATORVASTATIN 40 MG TABLET
40.0000 mg | ORAL_TABLET | Freq: Every day | ORAL | 3 refills | Status: DC
Start: 2019-01-26 — End: 2019-03-29

## 2019-01-30 ENCOUNTER — Other Ambulatory Visit: Payer: Self-pay | Admitting: *Deleted

## 2019-01-30 NOTE — Patient Outreach (Signed)
Ms. Galer called me this afternoon, reporting that she has had up to 5 instances of when she stands up she gets dizzy. She has had to sit down to let this feeling pass.  She is taking metoprolol succinate 50 mg po daily. She does not check her blood pressure at home and she does not have a blood pressure cuff.  Advised her that what she is experiencing is probably orthostatic hypotension. Your BP goes down when changing of position, namely when arising from lying down or from a sitting position to standing.  Advised to reduce her metoprolol succinate to 1/2 tablet daily =25 mg.   [facility] so you can monitor your blood pressure every day at the same time, for example before lunch if you have taken this medication in the am with breakfast.  Arise slowly and stand for at least a minute before you start walking so if you get dizzy you can sit right down.  Call me if any problems and I will call you on Friday to check in on you.  I will let Dr. Laurann Montana know that we reduced your medication.  [name]. Myrtie Neither, MSN, [facility] Gerontological Nurse Practitioner [facility] [phone]

## 2019-02-03 ENCOUNTER — Ambulatory Visit: Payer: Self-pay | Admitting: *Deleted

## 2019-02-06 DIAGNOSIS — K210 Gastro-esophageal reflux disease with esophagitis: Secondary | ICD-10-CM | POA: Diagnosis not present

## 2019-02-06 DIAGNOSIS — J45909 Unspecified asthma, uncomplicated: Secondary | ICD-10-CM | POA: Diagnosis not present

## 2019-02-06 DIAGNOSIS — K580 Irritable bowel syndrome with diarrhea: Secondary | ICD-10-CM | POA: Diagnosis not present

## 2019-02-06 DIAGNOSIS — F419 Anxiety disorder, unspecified: Secondary | ICD-10-CM | POA: Diagnosis not present

## 2019-02-13 ENCOUNTER — Ambulatory Visit: Payer: Medicare Other | Admitting: Diagnostic Neuroimaging

## 2019-02-14 DIAGNOSIS — R1013 Epigastric pain: Secondary | ICD-10-CM | POA: Diagnosis not present

## 2019-02-16 ENCOUNTER — Institutional Professional Consult (permissible substitution): Payer: Self-pay | Admitting: Neurology

## 2019-02-21 ENCOUNTER — Other Ambulatory Visit: Payer: Self-pay

## 2019-02-23 ENCOUNTER — Telehealth: Payer: Self-pay | Admitting: *Deleted

## 2019-02-23 NOTE — Telephone Encounter (Signed)
Called patient to move FU sooner (was to be seen in April) and advised her that Due to current COVID 19 pandemic, our office is severely reducing in person visits in order to minimize the risk to our patients and healthcare providers. We recommend to convert your appointment to a video visit. We'll take all precautions to reduce any security or privacy concerns. This will be treated like an office visit, and we will file with your insurance. She had done webex with Dr Rexene Alberts. I explained [email or web address], and she consented to video visit. Moved appt to next week, updated EMR. She stated her daughter may call next week for details because she will help her with visit. I stated that is fine. E mail sent to [email or web address].

## 2019-02-28 ENCOUNTER — Encounter: Payer: Self-pay | Admitting: Diagnostic Neuroimaging

## 2019-02-28 ENCOUNTER — Other Ambulatory Visit: Payer: Self-pay

## 2019-02-28 ENCOUNTER — Ambulatory Visit (INDEPENDENT_AMBULATORY_CARE_PROVIDER_SITE_OTHER): Payer: Medicare Other | Admitting: Diagnostic Neuroimaging

## 2019-02-28 ENCOUNTER — Ambulatory Visit: Payer: Self-pay | Admitting: Internal Medicine

## 2019-02-28 DIAGNOSIS — G40109 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, not intractable, without status epilepticus: Secondary | ICD-10-CM

## 2019-02-28 DIAGNOSIS — R413 Other amnesia: Secondary | ICD-10-CM

## 2019-02-28 MED ORDER — LACOSAMIDE 50 MG PO TABS: 50.0000 mg | ORAL_TABLET | Freq: Two times a day (BID) | ORAL | 5 refills | Status: DC

## 2019-02-28 MED ORDER — ZONISAMIDE 100 MG PO CAPS: 200.0000 mg | ORAL_CAPSULE | Freq: Every day | ORAL | 4 refills | Status: DC

## 2019-02-28 NOTE — Progress Notes (Signed)
Virtual Visit via Video Note  I connected with NEEMA BARREIRA on 02/28/19 at  2:00 PM EDT by a video enabled telemedicine application and verified that I am speaking with the correct person using two identifiers.   I discussed the limitations of evaluation and management by telemedicine and the availability of in person appointments. The patient expressed understanding and agreed to proceed.  Patient is at their home. I am at the office.    History of Present Illness:  - since last visit, doing well - has had a few deja vu spells (1 per month) - no seizures - tolerating vimpat and zonisamide - some mild memory loss issues    Observations/Objective:   VIDEO EXAM  GENERAL EXAM/CONSTITUTIONAL:  Vitals: There were no vitals filed for this visit.  There is no height or weight on file to calculate BMI. Wt Readings from Last 3 Encounters:  08/10/18 133 lb 12.8 oz (60.7 kg)  04/13/18 137 lb (62.1 kg)  02/22/18 134 lb 12.8 oz (61.1 kg)     Patient is in no distress; well developed, nourished and groomed; neck is supple   NEUROLOGIC: MENTAL STATUS:  MMSE - Mini Mental State Exam 03/17/2017 06/16/2016  Not completed: Refused;Unable to complete Unable to complete    awake, alert, oriented to person, place and time  recent and remote memory intact  normal attention and concentration  language fluent, comprehension intact, naming intact  fund of knowledge appropriate  CRANIAL NERVE:   2nd, 3rd, 4th, 6th - visual fields full to confrontation, extraocular muscles intact, no nystagmus  5th - facial sensation symmetric  7th - facial strength symmetric  8th - hearing intact  11th - shoulder shrug symmetric  12th - tongue protrusion midline  MOTOR:   NO TREMOR; NO DRIFT IN BUE  SENSORY:   normal and symmetric to light touch  COORDINATION:   fine finger movements normal    Assessment and Plan:  Dx:  1. Partial epilepsy ([place])   2. Memory loss      LOCALIZATION EPILEPSY (last major sz event ~Dec 2018; minor events deja vu spells 1 per month) - continue zonisamide 200mg  at bedtime (cannot tolerate higher dose) - continue vimpat 50mg  twice a day; may consider tapering off in future - may consider referral back to [facility] for seizure mgmt (eval'd in May 2018); hold off for now  Meds ordered this encounter  Medications  . zonisamide (ZONEGRAN) 100 MG capsule    Sig: Take 2 capsules (200 mg total) by mouth at bedtime.    Dispense:  180 capsule    Refill:  4    Dose increase  . lacosamide ([name]) 50 MG TABS tablet    Sig: Take 1 tablet (50 mg total) by mouth 2 (two) times daily.    Dispense:  60 tablet    Refill:  5   MEMORY LOSS - safety and supervision reviewed - caregiver resources provided - caution with driving and finances   Follow Up Instructions:  - Return in about 8 months (around 10/31/2019).    I discussed the assessment and treatment plan with the patient. The patient was provided an opportunity to ask questions and all were answered. The patient agreed with the plan and demonstrated an understanding of the instructions.   The patient was advised to call back or seek an in-person evaluation if the symptoms worsen or if the condition fails to improve as anticipated.  I provided 20 minutes of non-face-to-face  time during this encounter.   Penni Bombard, MD 2/62/0355, 9:74 PM Certified in Neurology, Neurophysiology and Neuroimaging  [facility] [address] [place], [place] [address] [phone]

## 2019-03-14 ENCOUNTER — Other Ambulatory Visit: Payer: Self-pay | Admitting: *Deleted

## 2019-03-14 NOTE — Patient Outreach (Signed)
Telephone assessment. Mrs. Sherrill called me this am reporting an odd side effect from her medication: carafate which she has been taking in the liquid form. She reports she since started this medication, she has developed an odor being emitted through her L axillary area. None on the right that she can perceive.   Advised pt I would reseach and then get back with her. Looked up side effects in multiple reputable sites and the closest thing that comes to this sx is excessive sweating.  Called [facility] and reported this side effect along with other medicatons she is taking and her medical hx.  Was told they would get back to me if there is any previously reported cases and what may be suggested interventions to minimize this problem.  Advised pt to wash or wipe her armpits with cleansing towellette bid to tid to help rid the area of the odor. Also advised she could get used to the odor but others around her would not so taking extra hygienic measures is the best thing to do for now.  Katelyn was very appreciative of the interventions.  Will report any news to her when I receive it.  [name]. Myrtie Neither, MSN, [facility] Gerontological Nurse Practitioner [facility] Care Management [phone]

## 2019-03-16 ENCOUNTER — Other Ambulatory Visit: Payer: Self-pay | Admitting: Gastroenterology

## 2019-03-16 DIAGNOSIS — R634 Abnormal weight loss: Secondary | ICD-10-CM | POA: Diagnosis not present

## 2019-03-16 DIAGNOSIS — R1084 Generalized abdominal pain: Secondary | ICD-10-CM | POA: Diagnosis not present

## 2019-03-20 DIAGNOSIS — R634 Abnormal weight loss: Secondary | ICD-10-CM | POA: Diagnosis not present

## 2019-03-20 DIAGNOSIS — R1084 Generalized abdominal pain: Secondary | ICD-10-CM | POA: Diagnosis not present

## 2019-03-29 ENCOUNTER — Other Ambulatory Visit: Payer: Self-pay | Admitting: Internal Medicine

## 2019-03-29 DIAGNOSIS — E785 Hyperlipidemia, unspecified: Secondary | ICD-10-CM

## 2019-03-29 MED ORDER — LEVOTHYROXINE 112 MCG TABLET
ORAL_TABLET | ORAL | 2 refills | Status: DC
Start: 2019-03-29 — End: 2019-06-20

## 2019-03-29 MED ORDER — ATORVASTATIN 40 MG TABLET
40.0000 mg | ORAL_TABLET | Freq: Every day | ORAL | 3 refills | Status: DC
Start: 2019-03-29 — End: 2019-11-07

## 2019-03-29 NOTE — Telephone Encounter (Signed)
Requested Prescriptions     Pending Prescriptions Disp Refills    Atorvastatin (LIPITOR) 40 mg tablet 90 tablet 3     Sig: Take 1 tablet by mouth every day.    LevoTHYROxine 112 mcg Tablet 90 tablet 2     Sig: One pill on the weekdays (thyroid)

## 2019-03-31 ENCOUNTER — Ambulatory Visit
Admission: RE | Admit: 2019-03-31 | Discharge: 2019-03-31 | Disposition: A | Discharge status: Home / Self Care | Payer: Medicare Other | Source: Ambulatory Visit | Attending: Gastroenterology | Admitting: Gastroenterology

## 2019-03-31 DIAGNOSIS — R634 Abnormal weight loss: Secondary | ICD-10-CM

## 2019-03-31 DIAGNOSIS — R1084 Generalized abdominal pain: Secondary | ICD-10-CM

## 2019-03-31 DIAGNOSIS — N289 Disorder of kidney and ureter, unspecified: Secondary | ICD-10-CM | POA: Diagnosis not present

## 2019-03-31 MED ORDER — IOPAMIDOL (ISOVUE-300) INJECTION 61%
100.0000 mL | Freq: Once | INTRAVENOUS | Status: AC | PRN
  Administered 2019-03-31: 100 mL via INTRAVENOUS

## 2019-04-17 DIAGNOSIS — D3001 Benign neoplasm of right kidney: Secondary | ICD-10-CM | POA: Diagnosis not present

## 2019-04-27 ENCOUNTER — Ambulatory Visit: Payer: Medicare Other | Attending: Internal Medicine

## 2019-04-27 DIAGNOSIS — E039 Hypothyroidism, unspecified: Secondary | ICD-10-CM | POA: Insufficient documentation

## 2019-04-27 DIAGNOSIS — E7800 Pure hypercholesterolemia, unspecified: Secondary | ICD-10-CM

## 2019-04-27 LAB — LIPID PANEL
Cholesterol: 191 mg/dL (ref 0–200)
HDL Cholesterol: 65 mg/dL (ref >=35)
LDL Cholesterol Calculation: 108 mg/dL (ref <130)
Non-HDL Cholesterol: 126 mg/dL (ref <=150)
Total Cholesterol: HDL Ratio: 2.9 (ref <4.0)
Triglyceride: 91 mg/dL (ref 35–160)

## 2019-04-27 LAB — HEPATIC FUNCTION PANEL
Alanine Transferase (ALT): 22 U/L (ref 5–54)
Albumin: 3.7 g/dL (ref 3.2–4.6)
Alkaline Phosphatase (ALP): 59 U/L (ref 35–115)
Aspartate Transaminase (AST): 28 U/L (ref 15–43)
Bilirubin Direct: 0.1 mg/dL (ref 0.0–0.2)
Bilirubin Total: 1.2 mg/dL (ref 0.3–1.3)
Protein: 6.5 g/dL (ref 6.3–8.3)

## 2019-04-27 LAB — THYROID STIMULATING HORMONE: Thyroid Stimulating Hormone: 1.21 u[IU]/mL (ref 0.35–3.30)

## 2019-05-01 ENCOUNTER — Ambulatory Visit: Payer: Medicare Other | Admitting: Diagnostic Neuroimaging

## 2019-05-02 ENCOUNTER — Other Ambulatory Visit: Payer: Self-pay | Admitting: Internal Medicine

## 2019-05-02 DIAGNOSIS — Z1231 Encounter for screening mammogram for malignant neoplasm of breast: Secondary | ICD-10-CM

## 2019-05-03 DIAGNOSIS — K449 Diaphragmatic hernia without obstruction or gangrene: Secondary | ICD-10-CM | POA: Diagnosis not present

## 2019-05-03 DIAGNOSIS — R1013 Epigastric pain: Secondary | ICD-10-CM | POA: Diagnosis not present

## 2019-05-03 DIAGNOSIS — K2940 Chronic atrophic gastritis without bleeding: Secondary | ICD-10-CM | POA: Diagnosis not present

## 2019-05-03 DIAGNOSIS — R634 Abnormal weight loss: Secondary | ICD-10-CM | POA: Diagnosis not present

## 2019-05-04 ENCOUNTER — Encounter: Payer: Self-pay | Admitting: Internal Medicine

## 2019-05-04 ENCOUNTER — Ambulatory Visit: Payer: Medicare Other | Admitting: Internal Medicine

## 2019-05-04 VITALS — BP 104/52 | HR 72 | Temp 97.0°F | Resp 16 | Ht 66.0 in | Wt 177.7 lb

## 2019-05-04 DIAGNOSIS — E039 Hypothyroidism, unspecified: Secondary | ICD-10-CM

## 2019-05-04 DIAGNOSIS — E7800 Pure hypercholesterolemia, unspecified: Secondary | ICD-10-CM

## 2019-05-04 NOTE — Nursing Note (Signed)
Tammy [name] has been identified by name, date of birth and address.This patient is accompanied by self  Since you last saw your [facility] provider, have you gone to the emergency room/urgent care or been hospitalized?  No  Have you started any over-the-counter medications, herbal supplements, vitamins or home remedies?  No    Cynda Familia, MA

## 2019-05-04 NOTE — Progress Notes (Signed)
Chief Complaint: Patient presents today with follow-up on the blood test results      Hyperlipidemia,    management discussed with patient, shehas  been working on low fat diet, she has  been doing regular exercise. Patient verbalised understanding about the significance for regular exercise and low fat diet in management of this problem  doing yard work gained few pounds   Lab Results   Lab Name Value Date/Time    CHOL 191 04/27/2019 07:33 AM    CHOL 188 05/09/2014 09:44 AM    LDLC 108 04/27/2019 07:33 AM    LDLC 107 05/09/2014 09:44 AM    HDL 65 04/27/2019 07:33 AM    HDL 59 05/09/2014 09:44 AM    TRIG 91 04/27/2019 07:33 AM    TRIG 108 05/09/2014 09:44 AM     No symptoms of hypo or hyperthyroidism: no decreased or increased weight, no feeling cold/chilly or excessively warm, no diarrhea or constipation, no undue sweatiness, anxiety or palpitations.   Lab Results   Lab Name Value Date/Time    TSH 1.21 04/27/2019 07:33 AM    TSH 1.89 05/09/2014 09:44 AM    FT4 1.27 06/17/2009 08:16 AM          Ros  complete review of symptons was done all other systems are negative except as otherwise stated in HPI    Vs  BP 104/52 (SITE: right arm, Orthostatic Position: sitting, Cuff Size: regular)   Pulse 72   Temp 36.1 C (97 F) (Temporal)   Resp 16   Ht 1.676 m (5\' 6" )   Wt 80.6 kg (177 lb 11.1 oz)   SpO2 98%   BMI 28.68 kg/m     Physical exam -  General Appearance: healthy, alert, no distress, pleasant affect, cooperative.  Eyes:  conjunctivae and corneas clear. PERRL, EOM's intact. sclerae normal.  Ears:  external inspection of ears show no abnormality.  Lungs: no chest deformities noted .lungs clear to auscultation.  Heart:  normal rate and regular rhythm, no murmurs,  Extremities:  no cyanosis, clubbing, or edema.    ASSESMENT AND PLAN     (E78.00) Pure hypercholesterolemia  (primary encounter diagnosis)  Comment: Well-controlled  Plan: COMPREHENSIVE METABOLIC PANEL, LIPID PANEL       (E03.9) Acquired  hypothyroidism  Comment:   Plan: THYROID STIMULATING HORMONE             I did review patient's past medical and family/social history, no changes noted.  Barriers to Learning assessed: none. Patient verbalizes understanding of teaching and instructions.  Harvie Heck MD      Electronically Signed By:  Harvie Heck, MD  Physician associate   [place]  [phone]

## 2019-05-24 DIAGNOSIS — R197 Diarrhea, unspecified: Secondary | ICD-10-CM | POA: Diagnosis not present

## 2019-05-25 ENCOUNTER — Encounter: Payer: Self-pay | Admitting: *Deleted

## 2019-05-29 ENCOUNTER — Other Ambulatory Visit: Payer: Self-pay

## 2019-05-29 ENCOUNTER — Encounter: Payer: Self-pay | Admitting: Diagnostic Neuroimaging

## 2019-05-29 ENCOUNTER — Ambulatory Visit: Payer: Medicare Other | Admitting: Diagnostic Neuroimaging

## 2019-05-29 VITALS — BP 126/56 | HR 63 | Temp 98.2°F | Ht 62.5 in | Wt 130.4 lb

## 2019-05-29 DIAGNOSIS — G40109 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, not intractable, without status epilepticus: Secondary | ICD-10-CM | POA: Diagnosis not present

## 2019-05-29 DIAGNOSIS — R413 Other amnesia: Secondary | ICD-10-CM

## 2019-05-29 MED ORDER — ZONISAMIDE 100 MG PO CAPS: 100.0000 mg | ORAL_CAPSULE | Freq: Every day | ORAL | 4 refills | Status: DC

## 2019-05-29 MED ORDER — LACOSAMIDE 50 MG PO TABS: 50.0000 mg | ORAL_TABLET | Freq: Two times a day (BID) | ORAL | 5 refills | Status: DC

## 2019-05-29 NOTE — Patient Instructions (Signed)
Reduce zonisamide to 100mg  at bedtime.   Continue vimpat 50mg  twice a day.   Follow up with psychiatry.

## 2019-05-29 NOTE — Progress Notes (Signed)
[facility]  PATIENT: Suzanne Galloway DOB: 12-24-1940  REFERRING CLINICIAN: Electa Sniff HISTORY FROM: patient  REASON FOR VISIT: follow up    HISTORICAL  CHIEF COMPLAINT:  Chief Complaint  Patient presents with  . Memory Loss    rm 7, dgtrJudeen Hammans  MMSE 24    HISTORY OF PRESENT ILLNESS:   UPDATE (05/29/19, VRP): Since last visit, doing poorly according to family. Continues with depression and anxiety. Severity is moderate. No alleviating or aggravating factors. Tolerating meds. Weekly deja vu spells. No major seizures. Some intermittent memory loss.   UPDATE (08/10/18, VRP): Since last visit, struggling with anxiety and depression sxs. No def sz except mild deja vu spell 1 week ago. No alleviating or aggravating factors. Tolerating meds. Psychiatry has increased lexapro 1 month ago.  PRIOR HPI (02/01/18): 78 year old female here for evaluation of seizures.  01/17/12 was a stressful year for patient.  Patient was taking care of her husband who ultimately passed away.  Then in 01-16-13 she began to have intermittent episodes of dj vu, anxiety, hot sensation in her body, a liquid sensation going down her chest and abdomen.  Episodes can last minutes at a time.  These occurred every few weeks.  Over time the increased in frequency.  These were attributed to anxiety and patient was treated with Lexapro, Wellbutrin and Ativan.  In 17-Jan-2015 patient was evaluated by neurologist for tremor.  She also reported episodes of dj vu and therefore EEG was ordered.  No epileptiform discharges were found.  This is followed up with ambulatory EEG which was also normal.  May 2018 patient presented to the hospital with retrograde memory loss, patient thinking it was 01-17-15, no recent memory of a beach trip recently, and other confusion.  Hospital EEG showed occasional sharp wave discharges in the right posterior frontal region.  Patient was then referred to [facility] for video EEG monitoring.   Occasional right temporal sharp wave activity was noted.  Patient had 2 electrographic seizures.  Patient was started on Lamictal but then this was discontinued due to lack of effectiveness and possible side effect.  This was changed to oxcarbazepine but this caused paranoia and confusion.  Patient was then started on zonisamide.  Patient developed some tremor which was initially thought to be anxiety related, but then possibly related to zonisamide, and therefore dose was slightly reduced.  Last event of possible seizure occurred 10/13/17, with memory lapse, confusion, dj vu sensation, anxiety sensation.  Patient currently on zonisamide 200 mg at bedtime for seizure prevention.  No significant tremors.  She is also on Lexapro 20 mg/day for anxiety and depression.  She is on lorazepam 0.5 mg twice a day for anxiety.  Patient still feels quite anxious and depressed at times, related to being stuck at home.  However when she is able to get out of the house she feels like going back home.  She is working with a Teacher, music and psychologist now.  Patient then requested a second opinion with our office.    REVIEW OF SYSTEMS: Full 14 system review of systems performed and negative with exception of: Weight loss fatigue memory loss depression anxiety change in appetite diarrhea shortness of breath feeling hot feeling cold hearing loss.  ALLERGIES: Allergies  Allergen Reactions  . Codeine Other (See Comments)    "makes me crazy"  . Lamictal [Lamotrigine] Other (See Comments)    Increased siezure activity.  . Oxcarbazepine Other (See Comments)    Over sedation  .  Symbicort [Budesonide-Formoterol Fumarate] Other (See Comments)    Had 2 "seizures" after taking.   . Atorvastatin   . Buspirone     Increased depression  . Carbamazepine Other (See Comments)    Paranoia, confusion  . Depakote [Divalproex Sodium] Nausea And Vomiting    lethargy  . Dilaudid [Hydromorphone] Other (See Comments)     Reaction:  Hallucinations   . Dulera [Mometasone Furo-Formoterol Fum] Other (See Comments)    Reaction:  Deja vu seizures   . Lactose Other (See Comments)  . Lactose Intolerance (Gi) Diarrhea  . Morphine And Related Nausea And Vomiting  . Other     Zonisamide- in higher doses caused tremors, anxiety  . Percocet [Oxycodone-Acetaminophen] Other (See Comments)    Reaction:  Hallucinations   . Pravastatin Other (See Comments)    Reaction:  Muscle pain, "weird dreams"  . Vesicare [Solifenacin] Other (See Comments)    Reaction:  Constipation     HOME MEDICATIONS: Outpatient Medications Prior to Visit  Medication Sig Dispense Refill  . b complex vitamins capsule Take 1 capsule by mouth daily.    . Calcium Carbonate-Vitamin D (CALCIUM 600+D) 600-400 MG-UNIT tablet Take 1 tablet by mouth daily.    Marland Kitchen escitalopram (LEXAPRO) 20 MG tablet Take 20 mg by mouth daily.  11  . lacosamide ([name]) 50 MG TABS tablet Take 1 tablet (50 mg total) by mouth 2 (two) times daily. 60 tablet 5  . levothyroxine (SYNTHROID, LEVOTHROID) 88 MCG tablet Take 88 mcg by mouth daily before breakfast.    . LORazepam (ATIVAN) 0.5 MG tablet Take 1 tablet by mouth every 8 (eight) hours as needed. 11/17/18 pt reports taking twice daily.  4  . metoprolol succinate (TOPROL-XL) 50 MG 24 hr tablet Take 50 mg by mouth daily. Take with or immediately following a meal.    . Multiple Vitamins-Minerals (PRESERVISION AREDS PO) Take 2 tablets by mouth daily.     Marland Kitchen omeprazole (PRILOSEC) 20 MG capsule Take 20 mg by mouth 2 (two) times daily before a meal.    . sucralfate (CARAFATE) 1 g tablet Take 1 g by mouth 4 (four) times daily -  with meals and at bedtime.    Marland Kitchen zonisamide (ZONEGRAN) 100 MG capsule Take 2 capsules (200 mg total) by mouth at bedtime. 180 capsule 4   No facility-administered medications prior to visit.     PAST MEDICAL HISTORY: Past Medical History:  Diagnosis Date  . Anxiety   . Arthritis    "hands, back, toes"  (01/01/2015)  . Asthma   . Chronic bronchitis ([place])    "get it just about q yr" (01/01/2015)  . Depression   . GERD (gastroesophageal reflux disease)   . Hearing loss    hearing aids  . High cholesterol   . Hyperlipemia   . Hypertension   . Hypothyroidism   . IBS (irritable bowel syndrome)   . Mitral valve prolapse   . Seizures [facility])    dx Aug 2018, most recent seizure 10/13/17, partial complex  . Urinary incontinence     PAST SURGICAL HISTORY: Past Surgical History:  Procedure Laterality Date  . BACK SURGERY    . CARPAL TUNNEL RELEASE Bilateral 2008  . CYSTECTOMY Right 09/2014   "hand"   . DILATION AND CURETTAGE OF UTERUS    . FINGER SURGERY Bilateral    "scraped arthritis out of thumbs"  . INCONTINENCE SURGERY    . LAPAROSCOPIC CHOLECYSTECTOMY    . LUMBAR LAMINECTOMY  1987; 12/2009   ; /  notes 01/16/2010  . TOE SURGERY Left 09/21/2005   "took bone out of 2nd toe; took cyst out"  . TUBAL LIGATION    . VAGINAL HYSTERECTOMY  1970    FAMILY HISTORY: Family History  Problem Relation Age of Onset  . COPD Father        smoked  . Heart disease Father   . Esophageal cancer Father        smoked  . Transient ischemic attack Father   . Cancer Father        kidney  . Deep vein thrombosis Brother   . Parkinson's disease Mother   . Heart failure Mother   . Diabetes Mother   . Cancer Daughter   . Diabetes Maternal Grandmother   . Diabetes Paternal Grandfather     SOCIAL HISTORY:  Social History   Socioeconomic History  . Marital status: Widowed    Spouse name: Not on file  . Number of children: 3  . Years of education: 17  . Highest education level: GED or equivalent  Occupational History  . Occupation: Retired    Comment: [employer] shop  Social Needs  . Financial resource strain: Not on file  . Food insecurity    Worry: Not on file    Inability: Not on file  . Transportation needs    Medical: Not on file    Non-medical: Not on file  Tobacco Use   . Smoking status: Never Smoker  . Smokeless tobacco: Never Used  Substance and Sexual Activity  . Alcohol use: Yes    Comment: 01/01/2015 "mixed drink q 2 months or so", 05/29/19 quit 2016  . Drug use: No  . Sexual activity: Not Currently  Lifestyle  . Physical activity    Days per week: Not on file    Minutes per session: Not on file  . Stress: Not on file  Relationships  . Social Herbalist on phone: Not on file    Gets together: Not on file    Attends religious service: Not on file    Active member of club or organization: Not on file    Attends meetings of clubs or organizations: Not on file    Relationship status: Not on file  . Intimate partner violence    Fear of current or ex partner: Not on file    Emotionally abused: Not on file    Physically abused: Not on file    Forced sexual activity: Not on file  Other Topics Concern  . Not on file  Social History Narrative   Lives with dgtr, [name] and son-in-law    Retired, widowed   Education- high school   Caffeine 1 cup daily     PHYSICAL EXAM   GENERAL EXAM/CONSTITUTIONAL: Vitals:  Vitals:   05/29/19 0939  BP: (!) 126/56  Pulse: 63  Temp: 98.2 F (36.8 C)  Weight: 130 lb 6.4 oz (59.1 kg)  Height: 5' 2.5" (1.588 m)   Wt Readings from Last 9 Encounters:  05/29/19 130 lb 6.4 oz (59.1 kg)  08/10/18 133 lb 12.8 oz (60.7 kg)  04/13/18 137 lb (62.1 kg)  02/22/18 134 lb 12.8 oz (61.1 kg)  02/01/18 140 lb 12.8 oz (63.9 kg)  01/11/18 142 lb 3.2 oz (64.5 kg)  10/21/17 140 lb (63.5 kg)  07/09/17 141 lb 12.8 oz (64.3 kg)  06/11/17 138 lb (62.6 kg)    Body mass index is 23.47 kg/m. No exam data present  Patient  is in no distress; well developed, nourished and groomed; neck is supple  CARDIOVASCULAR:  Examination of carotid arteries is normal; no carotid bruits  Regular rate and rhythm, no murmurs  Examination of peripheral vascular system by observation and palpation is normal  EYES:   Ophthalmoscopic exam of optic discs and posterior segments is normal; no papilledema or hemorrhages  MUSCULOSKELETAL:  Gait, strength, tone, movements noted in Neurologic exam below  NEUROLOGIC: MENTAL STATUS:  MMSE - Mini Mental State Exam 05/29/2019 03/17/2017 06/16/2016  Not completed: - Refused;Unable to complete Unable to complete  Orientation to time 5 - -  Orientation to Place 5 - -  Registration 3 - -  Attention/ Calculation 1 - -  Recall 2 - -  Language- name 2 objects 2 - -  Language- repeat 1 - -  Language- follow 3 step command 3 - -  Language- read & follow direction 1 - -  Write a sentence 1 - -  Copy design 0 - -  Total score 24 - -    awake, alert, oriented to person, place and time  recent and remote memory intact  normal attention and concentration  language fluent, comprehension intact, naming intact,   fund of knowledge appropriate  CRANIAL NERVE:   2nd - no papilledema on fundoscopic exam  2nd, 3rd, 4th, 6th - pupils equal and reactive to light, visual fields full to confrontation, extraocular muscles intact, no nystagmus  5th - facial sensation symmetric  7th - facial strength symmetric  8th - hearing intact  9th - palate elevates symmetrically, uvula midline  11th - shoulder shrug symmetric  12th - tongue protrusion midline  MOTOR:   normal bulk and tone, full strength in the BUE, BLE  SENSORY:   normal and symmetric to light touch, temperature, vibration  COORDINATION:   finger-nose-finger, fine finger movements normal  REFLEXES:   deep tendon reflexes present and symmetric  GAIT/STATION:   narrow based gait; romberg is negative    DIAGNOSTIC DATA (LABS, IMAGING, TESTING) - I reviewed patient records, labs, notes, testing and imaging myself where available.  Lab Results  Component Value Date   WBC 7.3 02/19/2017   HGB 12.5 02/19/2017   HCT 37.3 02/19/2017   MCV 92.1 02/19/2017   PLT 260 02/19/2017       Component Value Date/Time   NA 138 02/19/2017 0935   K 4.6 02/19/2017 0935   CL 101 02/19/2017 0935   CO2 28 02/19/2017 0935   GLUCOSE 112 (H) 02/19/2017 0935   BUN 14 02/19/2017 0935   CREATININE 0.87 02/19/2017 0935   CREATININE 0.82 12/27/2014 0714   CALCIUM 9.4 02/19/2017 0935   PROT 7.4 02/19/2017 0935   ALBUMIN 4.6 02/19/2017 0935   AST 28 02/19/2017 0935   ALT 21 02/19/2017 0935   ALKPHOS 69 02/19/2017 0935   BILITOT 0.4 02/19/2017 0935   GFRNONAA >60 02/19/2017 0935   GFRAA >60 02/19/2017 0935   Lab Results  Component Value Date   CHOL 174 01/02/2015   HDL 37 (L) 01/02/2015   LDLCALC 105 (H) 01/02/2015   TRIG 158 (H) 01/02/2015   CHOLHDL 4.7 01/02/2015   Lab Results  Component Value Date   HGBA1C 6.3 (H) 01/03/2015   Lab Results  Component Value Date   VITAMINB12 312 02/20/2017   Lab Results  Component Value Date   TSH 10.174 (H) 02/20/2017    02/19/17 MRI brain [I reviewed images myself and agree with interpretation. -VRP]  - Atrophy and  small vessel disease.  No acute intracranial findings. - No significant progression from 2016.  08/20/15 24 hour EEG IMPRESSION: This 24-hour ambulatory EEG study is normal.   CLINICAL CORRELATION: A normal EEG does not exclude a clinical diagnosis of epilepsy.  Typical events were not captured. If further clinical questions remain, inpatient video EEG monitoring may be helpful.  05/10/17 VEEG ([facility] report) - Patient admitted. Not on any anti-seizure drugs. Sleep deprived. Had one of her typical spells of deja vu, temperature change, with associated seizure arising right temporal region. Had another more severe event with post-ictal confusion, that unfortunately was not captured on camera, but again arose right temporal. Had right temporal sharps through her whole recording indicating an increased risk of seizures.     ASSESSMENT AND PLAN  78 y.o. year old female here with suspected right temporal lobe seizures, consisting  of dj vu sensation, temperature change, some memory lapse and confusion.  Also with comorbid anxiety and depression.  Meds tried: Lamictal (ineffective, side effects), oxcarbazepine (paranoia, confusion), zonisamide (tremor, anxiety), Depakote (lethargy, nausea), vimpat  Dx:  1. Partial epilepsy ([place])   2. 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 Memory loss     PLAN:  LOCALIZATION EPILEPSY (last major sz event ~Dec 2018; weekly deja vu spells) - family is concerned about medication side effects - reduce zonisamide to 100mg  at bedtime - continue vimpat 50mg  twice a day  - may consider referral back to [facility] epilepsy clinic for seizure mgmt (eval'd in May 2018)  ANXIETY DISORDER + DEPRESSION - continue lexapro + lorazepam - follow up with psychiatry / psychology   Return in about 4 months (around 09/28/2019).    Penni Bombard, MD 2/63/7858, 85:02 AM Certified in Neurology, Neurophysiology and Neuroimaging  [facility] [address] [place], [place] [address] [phone]

## 2019-06-02 DIAGNOSIS — J45909 Unspecified asthma, uncomplicated: Secondary | ICD-10-CM | POA: Diagnosis not present

## 2019-06-02 DIAGNOSIS — E782 Mixed hyperlipidemia: Secondary | ICD-10-CM | POA: Diagnosis not present

## 2019-06-02 DIAGNOSIS — M19049 Primary osteoarthritis, unspecified hand: Secondary | ICD-10-CM | POA: Diagnosis not present

## 2019-06-02 DIAGNOSIS — E039 Hypothyroidism, unspecified: Secondary | ICD-10-CM | POA: Diagnosis not present

## 2019-06-05 ENCOUNTER — Telehealth: Payer: Self-pay | Admitting: Diagnostic Neuroimaging

## 2019-06-05 NOTE — Telephone Encounter (Signed)
Called patient and reviewed Dr Gladstone Lighter plan per last office note with her in detail. She stated she had been taking zonisemide 200 mg one night, 100 mg the next night, alternating. She stated she wasn't sure he wanted her to go to 100 mg nightly "cold [place]". I again reviewed his notes with her.  I then advised her he stated to continue taking lorazepam, Lexapro as prescribed and follow up with her psychiatrist, per PCP referral Oct 2019. She  verbalized understanding, appreciation.

## 2019-06-05 NOTE — Telephone Encounter (Signed)
Pt is calling in wanting to get an understanding on tapering to her lower dose of zonisamide (ZONEGRAN) 100 MG capsule   She also wants to know how she will obtain the referral for a psychatrist

## 2019-06-13 NOTE — Telephone Encounter (Signed)
Pt has called to inform that re: the zonisamide (ZONEGRAN) 100 MG capsule being cut down to 1 it is helping with her seizures she is doing fine but she is still having the anxiety and depression.  Pt wants RN to know this week she will see Mickel Baas O'Neil(psychologist)Please call

## 2019-06-13 NOTE — Telephone Encounter (Addendum)
Spoke with patient who stated as far as deja vue and seizures, she's doing fine. Her call was mainly for our information. She stated her anxiety and depression medications are not helping her now. I advised she discuss this with L O'Neil. She agreed,  verbalized understanding, appreciation.

## 2019-06-16 ENCOUNTER — Other Ambulatory Visit: Payer: Self-pay

## 2019-06-16 ENCOUNTER — Ambulatory Visit
Admission: RE | Admit: 2019-06-16 | Discharge: 2019-06-16 | Disposition: A | Discharge status: Home / Self Care | Payer: Medicare Other | Source: Ambulatory Visit | Attending: Internal Medicine | Admitting: Internal Medicine

## 2019-06-16 DIAGNOSIS — Z1231 Encounter for screening mammogram for malignant neoplasm of breast: Secondary | ICD-10-CM

## 2019-06-20 ENCOUNTER — Other Ambulatory Visit: Payer: Self-pay | Admitting: Internal Medicine

## 2019-06-20 MED ORDER — LEVOTHYROXINE 112 MCG TABLET
ORAL_TABLET | ORAL | 2 refills | Status: DC
Start: 2019-06-20 — End: 2019-06-22

## 2019-06-20 NOTE — Telephone Encounter (Signed)
Patient is calling in regarding thyroid prescription and states this was changed at her last visit   Please review     She states she takes 1 tablet daily and needs a new prescription to reflect this   Please call when done       Patient will also need a small supply sent to the local pharmacy

## 2019-06-21 NOTE — Telephone Encounter (Signed)
Patient needs a a 10 day supply as [facility] states refill could take up to 2 weeks. Please send to [facility] [address]. Please advise

## 2019-06-22 MED ORDER — LEVOTHYROXINE 112 MCG TABLET
ORAL_TABLET | ORAL | 0 refills | Status: DC
Start: 2019-06-22 — End: 2019-11-07

## 2019-06-22 NOTE — Telephone Encounter (Signed)
Done

## 2019-06-22 NOTE — Addendum Note (Signed)
Addended byHarvie Heck on: 06/22/2019 11:44 AM     Modules accepted: Orders

## 2019-06-28 ENCOUNTER — Other Ambulatory Visit: Payer: Self-pay | Admitting: Urology

## 2019-06-28 DIAGNOSIS — D3000 Benign neoplasm of unspecified kidney: Secondary | ICD-10-CM

## 2019-07-04 DIAGNOSIS — F419 Anxiety disorder, unspecified: Secondary | ICD-10-CM | POA: Diagnosis not present

## 2019-07-17 DIAGNOSIS — F419 Anxiety disorder, unspecified: Secondary | ICD-10-CM | POA: Diagnosis not present

## 2019-07-21 ENCOUNTER — Ambulatory Visit: Payer: Medicare Other

## 2019-07-21 DIAGNOSIS — Z23 Encounter for immunization: Secondary | ICD-10-CM

## 2019-07-21 NOTE — Nursing Note (Signed)
The patient has received a flu vaccine previously: yes  The Influenza Vaccine VIS document for the flu injection was given to patient to review. Patient or person named in permission has answered no to any history of egg allergy, previous serious reaction to a influenza vaccine or current illness which would preclude them receiving an immunization via protocol and policy 2041 without additional physician input. Any questions were referred to the physician. The Influenza Vaccine was then administered per protocol or by Policy 2041 using the standing order from Dr. James Kirk (Academic) or Dr. Kurt Slapnik (Network). The patient was observed for 0 minutes for immediate reactions to the vaccine per protocol. no reaction was observed.   Darrel Baroni, LVN

## 2019-07-27 DIAGNOSIS — H04123 Dry eye syndrome of bilateral lacrimal glands: Secondary | ICD-10-CM | POA: Diagnosis not present

## 2019-07-27 DIAGNOSIS — H43813 Vitreous degeneration, bilateral: Secondary | ICD-10-CM | POA: Diagnosis not present

## 2019-07-27 DIAGNOSIS — Z961 Presence of intraocular lens: Secondary | ICD-10-CM | POA: Diagnosis not present

## 2019-07-31 DIAGNOSIS — F419 Anxiety disorder, unspecified: Secondary | ICD-10-CM | POA: Diagnosis not present

## 2019-08-01 ENCOUNTER — Telehealth: Payer: Self-pay | Admitting: Internal Medicine

## 2019-08-01 NOTE — Telephone Encounter (Signed)
Called and spoke with patient and gave her Dr Lysle Morales message , patient understood and will call pharmacy    Patient states [employer] will cover vaccine, I again let her know [employer] will cover the vaccine at the pharmacy

## 2019-08-01 NOTE — Telephone Encounter (Signed)
Patient request;  shingrix vaccine      Patient wants to do at [place] covered thru [facility]  Please advise.

## 2019-08-01 NOTE — Telephone Encounter (Signed)
Shingrix vaccine ordered please let her know it is cheaper to get at the pharmacy

## 2019-08-08 DIAGNOSIS — K580 Irritable bowel syndrome with diarrhea: Secondary | ICD-10-CM | POA: Diagnosis not present

## 2019-08-08 DIAGNOSIS — K2100 Gastro-esophageal reflux disease with esophagitis, without bleeding: Secondary | ICD-10-CM | POA: Diagnosis not present

## 2019-08-08 DIAGNOSIS — E039 Hypothyroidism, unspecified: Secondary | ICD-10-CM | POA: Diagnosis not present

## 2019-08-08 DIAGNOSIS — E782 Mixed hyperlipidemia: Secondary | ICD-10-CM | POA: Diagnosis not present

## 2019-08-15 DIAGNOSIS — R509 Fever, unspecified: Secondary | ICD-10-CM | POA: Diagnosis not present

## 2019-08-28 DIAGNOSIS — F419 Anxiety disorder, unspecified: Secondary | ICD-10-CM | POA: Diagnosis not present

## 2019-09-05 DIAGNOSIS — Z0120 Encounter for dental examination and cleaning without abnormal findings: Secondary | ICD-10-CM | POA: Diagnosis not present

## 2019-09-12 ENCOUNTER — Other Ambulatory Visit (HOSPITAL_COMMUNITY): Payer: Self-pay | Admitting: *Deleted

## 2019-09-12 DIAGNOSIS — M19049 Primary osteoarthritis, unspecified hand: Secondary | ICD-10-CM | POA: Diagnosis not present

## 2019-09-12 DIAGNOSIS — E782 Mixed hyperlipidemia: Secondary | ICD-10-CM | POA: Diagnosis not present

## 2019-09-12 DIAGNOSIS — E039 Hypothyroidism, unspecified: Secondary | ICD-10-CM | POA: Diagnosis not present

## 2019-09-12 DIAGNOSIS — J45909 Unspecified asthma, uncomplicated: Secondary | ICD-10-CM | POA: Diagnosis not present

## 2019-09-12 DIAGNOSIS — R1310 Dysphagia, unspecified: Secondary | ICD-10-CM

## 2019-09-21 ENCOUNTER — Other Ambulatory Visit: Payer: Self-pay

## 2019-09-21 ENCOUNTER — Ambulatory Visit (HOSPITAL_COMMUNITY)
Admission: RE | Admit: 2019-09-21 | Discharge: 2019-09-21 | Disposition: A | Discharge status: Home / Self Care | Payer: Medicare Other | Source: Ambulatory Visit | Attending: Gastroenterology | Admitting: Gastroenterology

## 2019-09-21 DIAGNOSIS — R1310 Dysphagia, unspecified: Secondary | ICD-10-CM

## 2019-09-21 DIAGNOSIS — R05 Cough: Secondary | ICD-10-CM | POA: Diagnosis not present

## 2019-09-26 DIAGNOSIS — F419 Anxiety disorder, unspecified: Secondary | ICD-10-CM | POA: Diagnosis not present

## 2019-10-03 ENCOUNTER — Encounter: Payer: Self-pay | Admitting: *Deleted

## 2019-10-03 ENCOUNTER — Ambulatory Visit: Payer: Medicare Other | Admitting: Diagnostic Neuroimaging

## 2019-10-03 ENCOUNTER — Other Ambulatory Visit: Payer: Self-pay

## 2019-10-03 VITALS — BP 153/75 | HR 65 | Temp 97.2°F | Ht 62.0 in | Wt 129.0 lb

## 2019-10-03 DIAGNOSIS — G40109 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, not intractable, without status epilepticus: Secondary | ICD-10-CM

## 2019-10-03 DIAGNOSIS — R413 Other amnesia: Secondary | ICD-10-CM

## 2019-10-03 MED ORDER — LACOSAMIDE 50 MG PO TABS: 50.0000 mg | ORAL_TABLET | Freq: Two times a day (BID) | ORAL | 4 refills | Status: DC

## 2019-10-03 MED ORDER — ZONISAMIDE 100 MG PO CAPS: 200.0000 mg | ORAL_CAPSULE | Freq: Every day | ORAL | 4 refills | Status: DC

## 2019-10-03 NOTE — Progress Notes (Signed)
[facility]  PATIENT: Suzanne Galloway DOB: 02/06/1941  REFERRING CLINICIAN: Electa Sniff HISTORY FROM: patient  REASON FOR VISIT: follow up    HISTORICAL  CHIEF COMPLAINT:  Chief Complaint  Patient presents with  . Epilepsy, memory loss    rm 6, 4 month FU, "having deja vu seizures in past month, maybe due to recent high anxiety"  MMSE 24    HISTORY OF PRESENT ILLNESS:   UPDATE (10/03/19, VRP): Since last visit, more stress and tragedy from her friends medical issues. More panic and anxiety and stomach issues. Deja vu spells continue, slightly more so patient increased her zonisamide. Symptoms are otherwise stable.   UPDATE (05/29/19, VRP): Since last visit, doing poorly according to family. Continues with depression and anxiety. Severity is moderate. No alleviating or aggravating factors. Tolerating meds. Weekly deja vu spells. No major seizures. Some intermittent memory loss.   UPDATE (08/10/18, VRP): Since last visit, struggling with anxiety and depression sxs. No def sz except mild deja vu spell 1 week ago. No alleviating or aggravating factors. Tolerating meds. Psychiatry has increased lexapro 1 month ago.  PRIOR HPI (02/01/18): 78 year old female here for evaluation of seizures.  Jan 24, 2012 was a stressful year for patient.  Patient was taking care of her husband who ultimately passed away.  Then in 23-Jan-2013 she began to have intermittent episodes of dj vu, anxiety, hot sensation in her body, a liquid sensation going down her chest and abdomen.  Episodes can last minutes at a time.  These occurred every few weeks.  Over time the increased in frequency.  These were attributed to anxiety and patient was treated with Lexapro, Wellbutrin and Ativan.  In 2015/01/24 patient was evaluated by neurologist for tremor.  She also reported episodes of dj vu and therefore EEG was ordered.  No epileptiform discharges were found.  This is followed up with ambulatory EEG which was also  normal.  May 2018 patient presented to the hospital with retrograde memory loss, patient thinking it was 24-Jan-2015, no recent memory of a beach trip recently, and other confusion.  Hospital EEG showed occasional sharp wave discharges in the right posterior frontal region.  Patient was then referred to [facility] for video EEG monitoring.  Occasional right temporal sharp wave activity was noted.  Patient had 2 electrographic seizures.  Patient was started on Lamictal but then this was discontinued due to lack of effectiveness and possible side effect.  This was changed to oxcarbazepine but this caused paranoia and confusion.  Patient was then started on zonisamide.  Patient developed some tremor which was initially thought to be anxiety related, but then possibly related to zonisamide, and therefore dose was slightly reduced.  Last event of possible seizure occurred 10/13/17, with memory lapse, confusion, dj vu sensation, anxiety sensation.  Patient currently on zonisamide 200 mg at bedtime for seizure prevention.  No significant tremors.  She is also on Lexapro 20 mg/day for anxiety and depression.  She is on lorazepam 0.5 mg twice a day for anxiety.  Patient still feels quite anxious and depressed at times, related to being stuck at home.  However when she is able to get out of the house she feels like going back home.  She is working with a Teacher, music and psychologist now.  Patient then requested a second opinion with our office.    REVIEW OF SYSTEMS: Full 14 system review of systems performed and negative with exception of: as per HPI.   ALLERGIES: Allergies  Allergen  Reactions  . Codeine Other (See Comments)    "makes me crazy"  . Lamictal [Lamotrigine] Other (See Comments)    Increased siezure activity.  . Oxcarbazepine Other (See Comments)    Over sedation  . Symbicort [Budesonide-Formoterol Fumarate] Other (See Comments)    Had 2 "seizures" after taking.   . Atorvastatin   .  Buspirone     Increased depression  . Carbamazepine Other (See Comments)    Paranoia, confusion  . Depakote [Divalproex Sodium] Nausea And Vomiting    lethargy  . Dilaudid [Hydromorphone] Other (See Comments)    Reaction:  Hallucinations   . Dulera [Mometasone Furo-Formoterol Fum] Other (See Comments)    Reaction:  Deja vu seizures   . Lactose Other (See Comments)  . Lactose Intolerance (Gi) Diarrhea  . Morphine And Related Nausea And Vomiting  . Other     Zonisamide- in higher doses caused tremors, anxiety  . Percocet [Oxycodone-Acetaminophen] Other (See Comments)    Reaction:  Hallucinations   . Pravastatin Other (See Comments)    Reaction:  Muscle pain, "weird dreams"  . Vesicare [Solifenacin] Other (See Comments)    Reaction:  Constipation     HOME MEDICATIONS: Outpatient Medications Prior to Visit  Medication Sig Dispense Refill  . ALPRAZolam (XANAX) 0.25 MG tablet Take 0.25 mg by mouth 3 (three) times daily.    Marland Kitchen b complex vitamins capsule Take 1 capsule by mouth daily.    . Calcium Carbonate-Vitamin D (CALCIUM 600+D) 600-400 MG-UNIT tablet Take 1 tablet by mouth daily.    Marland Kitchen escitalopram (LEXAPRO) 20 MG tablet Take 20 mg by mouth daily.  11  . lacosamide ([name]) 50 MG TABS tablet Take 1 tablet (50 mg total) by mouth 2 (two) times daily. 60 tablet 5  . levothyroxine (SYNTHROID) 75 MCG tablet Take 75 mcg by mouth every morning. 10/03/19 takes 88 mcg alternating with 75 mcg daily    . levothyroxine (SYNTHROID, LEVOTHROID) 88 MCG tablet Take 88 mcg by mouth daily before breakfast.    . LORazepam (ATIVAN) 0.5 MG tablet Take 1 tablet by mouth every 8 (eight) hours as needed. 11/17/18 pt reports taking twice daily.  4  . metoprolol succinate (TOPROL-XL) 50 MG 24 hr tablet Take 50 mg by mouth daily. Take with or immediately following a meal.    . Multiple Vitamins-Minerals (PRESERVISION AREDS PO) Take 2 tablets by mouth daily.     Marland Kitchen omeprazole (PRILOSEC) 20 MG capsule Take 20 mg by  mouth 2 (two) times daily before a meal.    . sucralfate (CARAFATE) 1 g tablet Take 1 g by mouth 4 (four) times daily -  with meals and at bedtime.    Marland Kitchen zonisamide (ZONEGRAN) 100 MG capsule Take 1 capsule (100 mg total) by mouth at bedtime. 90 capsule 4   No facility-administered medications prior to visit.    PAST MEDICAL HISTORY: Past Medical History:  Diagnosis Date  . Anxiety   . Arthritis    "hands, back, toes" (01/01/2015)  . Asthma   . Chronic bronchitis ([place])    "get it just about q yr" (01/01/2015)  . Depression   . GERD (gastroesophageal reflux disease)   . Hearing loss    hearing aids  . High cholesterol   . Hyperlipemia   . Hypertension   . Hypothyroidism   . IBS (irritable bowel syndrome)   . Mitral valve prolapse   . Seizures [facility])    dx Aug 2018, most recent seizure 10/13/17, partial complex  .  Urinary incontinence     PAST SURGICAL HISTORY: Past Surgical History:  Procedure Laterality Date  . BACK SURGERY    . CARPAL TUNNEL RELEASE Bilateral 2008  . CYSTECTOMY Right 09/2014   "hand"   . DILATION AND CURETTAGE OF UTERUS    . FINGER SURGERY Bilateral    "scraped arthritis out of thumbs"  . INCONTINENCE SURGERY    . LAPAROSCOPIC CHOLECYSTECTOMY    . LUMBAR LAMINECTOMY  1987; 12/2009   ; Archie Endo 01/16/2010  . TOE SURGERY Left 09/21/2005   "took bone out of 2nd toe; took cyst out"  . TUBAL LIGATION    . VAGINAL HYSTERECTOMY  1970    FAMILY HISTORY: Family History  Problem Relation Age of Onset  . COPD Father        smoked  . Heart disease Father   . Esophageal cancer Father        smoked  . Transient ischemic attack Father   . Cancer Father        kidney  . Deep vein thrombosis Brother   . Parkinson's disease Mother   . Heart failure Mother   . Diabetes Mother   . Cancer Daughter   . Diabetes Maternal Grandmother   . Diabetes Paternal Grandfather     SOCIAL HISTORY:  Social History   Socioeconomic History  . Marital status: Widowed     Spouse name: Not on file  . Number of children: 3  . Years of education: 73  . Highest education level: GED or equivalent  Occupational History  . Occupation: Retired    Comment: [employer] shop  Tobacco Use  . Smoking status: Never Smoker  . Smokeless tobacco: Never Used  Substance and Sexual Activity  . Alcohol use: Yes    Comment: 01/01/2015 "mixed drink q 2 months or so", 05/29/19 quit 2016  . Drug use: No  . Sexual activity: Not Currently  Other Topics Concern  . Not on file  Social History Narrative   10/03/19 Lives with dgtr, [name] and son-in-law    Retired, widowed   Education- high school   Caffeine 1 cup daily   Social Determinants of Health   Financial Resource Strain:   . Difficulty of Paying Living Expenses: Not on file  Food Insecurity:   . Worried About Charity fundraiser in the Last Year: Not on file  . Ran Out of Food in the Last Year: Not on file  Transportation Needs:   . Lack of Transportation (Medical): Not on file  . Lack of Transportation (Non-Medical): Not on file  Physical Activity:   . Days of Exercise per Week: Not on file  . Minutes of Exercise per Session: Not on file  Stress:   . Feeling of Stress : Not on file  Social Connections:   . Frequency of Communication with Friends and Family: Not on file  . Frequency of Social Gatherings with Friends and Family: Not on file  . Attends Religious Services: Not on file  . Active Member of Clubs or Organizations: Not on file  . Attends Archivist Meetings: Not on file  . Marital Status: Not on file  Intimate Partner Violence:   . Fear of Current or Ex-Partner: Not on file  . Emotionally Abused: Not on file  . Physically Abused: Not on file  . Sexually Abused: Not on file     PHYSICAL EXAM   GENERAL EXAM/CONSTITUTIONAL: Vitals:  Vitals:   10/03/19 1038  BP: Marland Kitchen)  153/75  Pulse: 65  Temp: (!) 97.2 F (36.2 C)  Weight: 129 lb (58.5 kg)  Height: 5\' 2"  (1.575 m)   Wt  Readings from Last 9 Encounters:  10/03/19 129 lb (58.5 kg)  05/29/19 130 lb 6.4 oz (59.1 kg)  08/10/18 133 lb 12.8 oz (60.7 kg)  04/13/18 137 lb (62.1 kg)  02/22/18 134 lb 12.8 oz (61.1 kg)  02/01/18 140 lb 12.8 oz (63.9 kg)  01/11/18 142 lb 3.2 oz (64.5 kg)  10/21/17 140 lb (63.5 kg)  07/09/17 141 lb 12.8 oz (64.3 kg)    Body mass index is 23.59 kg/m. No exam data present  Patient is in no distress; well developed, nourished and groomed; neck is supple  CARDIOVASCULAR:  Examination of carotid arteries is normal; no carotid bruits  Regular rate and rhythm, no murmurs  Examination of peripheral vascular system by observation and palpation is normal  EYES:  Ophthalmoscopic exam of optic discs and posterior segments is normal; no papilledema or hemorrhages  MUSCULOSKELETAL:  Gait, strength, tone, movements noted in Neurologic exam below  NEUROLOGIC: MENTAL STATUS:  MMSE - Mini Mental State Exam 10/03/2019 05/29/2019 03/17/2017  Not completed: - - Refused;Unable to complete  Orientation to time 4 5 -  Orientation to Place 4 5 -  Registration 3 3 -  Attention/ Calculation 1 1 -  Recall 3 2 -  Language- name 2 objects 2 2 -  Language- repeat 1 1 -  Language- follow 3 step command 3 3 -  Language- read & follow direction 1 1 -  Write a sentence 1 1 -  Copy design 1 0 -  Total score 24 24 -    awake, alert, oriented to person, place and time  recent and remote memory intact  normal attention and concentration  language fluent, comprehension intact, naming intact,   fund of knowledge appropriate  CRANIAL NERVE:   2nd - no papilledema on fundoscopic exam  2nd, 3rd, 4th, 6th - pupils equal and reactive to light, visual fields full to confrontation, extraocular muscles intact, no nystagmus  5th - facial sensation symmetric  7th - facial strength symmetric  8th - hearing intact  9th - palate elevates symmetrically, uvula midline  11th - shoulder shrug  symmetric  12th - tongue protrusion midline  MOTOR:   normal bulk and tone, full strength in the BUE, BLE  SENSORY:   normal and symmetric to light touch, temperature, vibration  COORDINATION:   finger-nose-finger, fine finger movements normal  REFLEXES:   deep tendon reflexes present and symmetric  GAIT/STATION:   narrow based gait; romberg is negative    DIAGNOSTIC DATA (LABS, IMAGING, TESTING) - I reviewed patient records, labs, notes, testing and imaging myself where available.  Lab Results  Component Value Date   WBC 7.3 02/19/2017   HGB 12.5 02/19/2017   HCT 37.3 02/19/2017   MCV 92.1 02/19/2017   PLT 260 02/19/2017      Component Value Date/Time   NA 138 02/19/2017 0935   K 4.6 02/19/2017 0935   CL 101 02/19/2017 0935   CO2 28 02/19/2017 0935   GLUCOSE 112 (H) 02/19/2017 0935   BUN 14 02/19/2017 0935   CREATININE 0.87 02/19/2017 0935   CREATININE 0.82 12/27/2014 0714   CALCIUM 9.4 02/19/2017 0935   PROT 7.4 02/19/2017 0935   ALBUMIN 4.6 02/19/2017 0935   AST 28 02/19/2017 0935   ALT 21 02/19/2017 0935   ALKPHOS 69 02/19/2017 0935   BILITOT 0.4  02/19/2017 0935   GFRNONAA >60 02/19/2017 0935   GFRAA >60 02/19/2017 0935   Lab Results  Component Value Date   CHOL 174 01/02/2015   HDL 37 (L) 01/02/2015   LDLCALC 105 (H) 01/02/2015   TRIG 158 (H) 01/02/2015   CHOLHDL 4.7 01/02/2015   Lab Results  Component Value Date   HGBA1C 6.3 (H) 01/03/2015   Lab Results  Component Value Date   VITAMINB12 312 02/20/2017   Lab Results  Component Value Date   TSH 10.174 (H) 02/20/2017    02/19/17 MRI brain [I reviewed images myself and agree with interpretation. -VRP]  - Atrophy and small vessel disease.  No acute intracranial findings. - No significant progression from 2016.  08/20/15 24 hour EEG IMPRESSION: This 24-hour ambulatory EEG study is normal.   CLINICAL CORRELATION: A normal EEG does not exclude a clinical diagnosis of epilepsy.  Typical  events were not captured. If further clinical questions remain, inpatient video EEG monitoring may be helpful.  05/10/17 VEEG ([facility] report) - Patient admitted. Not on any anti-seizure drugs. Sleep deprived. Had one of her typical spells of deja vu, temperature change, with associated seizure arising right temporal region. Had another more severe event with post-ictal confusion, that unfortunately was not captured on camera, but again arose right temporal. Had right temporal sharps through her whole recording indicating an increased risk of seizures.     ASSESSMENT AND PLAN  78 y.o. year old female here with suspected right temporal lobe seizures, consisting of dj vu sensation, temperature change, some memory lapse and confusion.  Also with comorbid anxiety and depression.  Meds tried: Lamictal (ineffective, side effects), oxcarbazepine (paranoia, confusion), zonisamide (tremor, anxiety), Depakote (lethargy, nausea), vimpat  Dx:  1. Partial epilepsy ([place])   2. Memory loss      PLAN:  LOCALIZATION EPILEPSY (last major sz event ~Dec 2018; weekly deja vu spellsl worsening) - continue zonisamide 200mg  at bedtime - continue vimpat 50mg  twice a day  - if spells or seizures sig worsen, may consider referral back to [facility] epilepsy clinic for seizure mgmt (last eval'd in May 2018)  MEMORY LOSS (mild) - likely related to seizure d/o, seizure meds and mood d/o - supportive care  ANXIETY DISORDER + DEPRESSION (worsening) - continue lexapro + lorazepam - follow up with psychiatry / psychology  Meds ordered this encounter  Medications  . zonisamide (ZONEGRAN) 100 MG capsule    Sig: Take 2 capsules (200 mg total) by mouth at bedtime.    Dispense:  180 capsule    Refill:  4  . lacosamide ([name]) 50 MG TABS tablet    Sig: Take 1 tablet (50 mg total) by mouth 2 (two) times daily.    Dispense:  180 tablet    Refill:  4   Return in about 1 year (around 10/02/2020) for with NP ([name]  Lomax).    Penni Bombard, MD [id number], [id number] AM Certified in Neurology, Neurophysiology and Neuroimaging  [facility] [address] [place], [place] [address] [phone]

## 2019-10-05 DIAGNOSIS — F419 Anxiety disorder, unspecified: Secondary | ICD-10-CM | POA: Diagnosis not present

## 2019-10-09 ENCOUNTER — Ambulatory Visit
Admission: RE | Admit: 2019-10-09 | Discharge: 2019-10-09 | Disposition: A | Discharge status: Home / Self Care | Payer: Medicare Other | Source: Ambulatory Visit | Attending: Urology | Admitting: Urology

## 2019-10-09 ENCOUNTER — Other Ambulatory Visit: Payer: Self-pay

## 2019-10-09 DIAGNOSIS — D3000 Benign neoplasm of unspecified kidney: Secondary | ICD-10-CM

## 2019-10-09 DIAGNOSIS — N281 Cyst of kidney, acquired: Secondary | ICD-10-CM | POA: Diagnosis not present

## 2019-10-09 MED ORDER — GADOBENATE DIMEGLUMINE 529 MG/ML IV SOLN
11.0000 mL | Freq: Once | INTRAVENOUS | Status: AC | PRN
  Administered 2019-10-09: 11 mL via INTRAVENOUS

## 2019-10-10 DIAGNOSIS — E039 Hypothyroidism, unspecified: Secondary | ICD-10-CM | POA: Diagnosis not present

## 2019-10-17 DIAGNOSIS — D3000 Benign neoplasm of unspecified kidney: Secondary | ICD-10-CM | POA: Diagnosis not present

## 2019-10-25 DIAGNOSIS — R109 Unspecified abdominal pain: Secondary | ICD-10-CM | POA: Diagnosis not present

## 2019-10-31 ENCOUNTER — Ambulatory Visit: Payer: Medicare Other | Attending: Internal Medicine

## 2019-10-31 DIAGNOSIS — E039 Hypothyroidism, unspecified: Secondary | ICD-10-CM | POA: Insufficient documentation

## 2019-10-31 DIAGNOSIS — E7800 Pure hypercholesterolemia, unspecified: Secondary | ICD-10-CM | POA: Insufficient documentation

## 2019-10-31 LAB — COMPREHENSIVE METABOLIC PANEL
Alanine Transferase (ALT): 24 U/L (ref 5–54)
Albumin: 3.4 g/dL (ref 3.2–4.6)
Alkaline Phosphatase (ALP): 59 U/L (ref 35–115)
Aspartate Transaminase (AST): 29 U/L (ref 15–43)
Bilirubin Total: 0.6 mg/dL (ref 0.3–1.3)
Calcium: 8.9 mg/dL (ref 8.6–10.5)
Carbon Dioxide Total: 28 mmol/L (ref 24–32)
Chloride: 106 mmol/L (ref 95–110)
Creatinine Serum: 0.77 mg/dL (ref 0.44–1.27)
E-GFR Creatinine (Female): 75 mL/min/{1.73_m2}
E-GFR, African American (Female): 86 mL/min/{1.73_m2}
Glucose: 80 mg/dL (ref 70–99)
Potassium: 4.2 mmol/L (ref 3.3–5.0)
Protein: 6.1 g/dL — ABNORMAL LOW (ref 6.3–8.3)
Sodium: 141 mmol/L (ref 135–145)
Urea Nitrogen, Blood (BUN): 18 mg/dL (ref 8–22)

## 2019-10-31 LAB — LIPID PANEL
Cholesterol: 193 mg/dL (ref 0–200)
HDL Cholesterol: 63 mg/dL (ref >=35)
LDL Cholesterol Calculation: 113 mg/dL (ref <130)
Non-HDL Cholesterol: 130 mg/dL (ref <=150)
Total Cholesterol: HDL Ratio: 3.1 (ref <4.0)
Triglyceride: 84 mg/dL (ref 35–160)

## 2019-10-31 LAB — THYROID STIMULATING HORMONE: Thyroid Stimulating Hormone: 9.79 u[IU]/mL — ABNORMAL HIGH (ref 0.35–3.30)

## 2019-11-04 ENCOUNTER — Other Ambulatory Visit: Payer: Self-pay | Admitting: Family Medicine

## 2019-11-07 ENCOUNTER — Ambulatory Visit: Payer: Medicare Other | Admitting: Internal Medicine

## 2019-11-07 DIAGNOSIS — I480 Paroxysmal atrial fibrillation: Secondary | ICD-10-CM

## 2019-11-07 DIAGNOSIS — E785 Hyperlipidemia, unspecified: Secondary | ICD-10-CM

## 2019-11-07 DIAGNOSIS — I2782 Chronic pulmonary embolism: Secondary | ICD-10-CM

## 2019-11-07 DIAGNOSIS — E039 Hypothyroidism, unspecified: Secondary | ICD-10-CM

## 2019-11-07 MED ORDER — ATORVASTATIN 40 MG TABLET
40.0000 mg | ORAL_TABLET | Freq: Every day | ORAL | 3 refills | Status: DC
Start: 2019-11-07 — End: 2020-04-24

## 2019-11-07 MED ORDER — LEVOTHYROXINE 112 MCG TABLET
ORAL_TABLET | ORAL | 2 refills | Status: DC
Start: 2019-11-07 — End: 2020-04-24

## 2019-11-07 NOTE — Progress Notes (Signed)
I performed a virtual check with the patient via telephone for follow-up on the blood test results.  I obtained verbal consent from the patient to perform this clinical encounter    Hypertension: Patient has been checking blood pressure  .she does  take medications regularly due to havaing  blood pressure  Current cardiovascular symptoms are none.   Lab Results   Lab Name Value Date/Time    NA 141 10/31/2019 07:16 AM    NA 139 05/09/2014 09:44 AM    K 4.2 10/31/2019 07:16 AM    K 3.9 05/09/2014 09:44 AM    CL 106 10/31/2019 07:16 AM    CL 106 05/09/2014 09:44 AM    CO2 28 10/31/2019 07:16 AM    CO2 27 05/09/2014 09:44 AM    BUN 18 10/31/2019 07:16 AM    BUN 15 05/09/2014 09:44 AM    CR 0.77 10/31/2019 07:16 AM    CR 0.69 05/09/2014 09:44 AM    GLU 80 10/31/2019 07:16 AM    GLU 96 05/09/2014 09:44 AM     Hyperlipidemia,    management discussed with patient, shehas  been working on low fat diet, she has  been doing regular exercise. Patient verbalised understanding about the significance for regular exercise and low fat diet in management of this problem    Lab Results   Lab Name Value Date/Time    CHOL 193 10/31/2019 07:16 AM    CHOL 188 05/09/2014 09:44 AM    LDLC 113 10/31/2019 07:16 AM    LDLC 107 05/09/2014 09:44 AM    HDL 63 10/31/2019 07:16 AM    HDL 59 05/09/2014 09:44 AM    TRIG 84 10/31/2019 07:16 AM    TRIG 108 05/09/2014 09:44 AM     No symptoms of hypo or hyperthyroidism: no decreased or increased weight, no feeling cold/chilly or excessively warm, no diarrhea or constipation, no undue sweatiness, anxiety or palpitations.   mvi in am with thyroid pills     Lab Results   Lab Name Value Date/Time    TSH 9.79 (H) 10/31/2019 07:16 AM    TSH 1.89 05/09/2014 09:44 AM    FT4 1.27 06/17/2009 08:16 AM        Fall risks Assessment:   No falls in past year. No throw rugs, poorly lighted areas, missing rails/grab bars in the home. No medications or conditions that affect stability or mobility.        Objective-  General Appearance: healthy, alert, no distress, pleasant affect, cooperative.  Mental Status: Appearance/Cooperation: in no apparent distress and well groomed and dressed, pleasant attitude and normal behavior with a normal eye contact        ASSESMENT AND PLAN     (E03.9) Acquired hypothyroidism  (primary encounter diagnosis)  Comment:   Plan: Thyroid Stimulating Hormone      (E78.5) Dyslipidemia  Comment: Cholesterol is well controlled  Plan: Atorvastatin (LIPITOR) 40 mg tablet            (I48.0) Paroxysmal atrial fibrillation (HCC)  Comment: History of atrial fibrillation in 2010, no recurrent episode  Plan:    (I27.82) Other chronic pulmonary embolism without acute cor pulmonale (HCC)  Comment: History of in 2010 currently not on any anticoagulation  Plan:      I did review patient's past medical and family/social history, no changes noted.  Barriers to Learning assessed: none. Patient verbalizes understanding of teaching and instructions.  Zettie Cooley MD      Electronically  Signed By:  Harvie Heck, MD  Physician associate   [place]  [phone]

## 2019-11-08 ENCOUNTER — Telehealth: Payer: Self-pay | Admitting: Internal Medicine

## 2019-11-08 ENCOUNTER — Ambulatory Visit: Payer: Medicare Other

## 2019-11-08 NOTE — Telephone Encounter (Signed)
Attempted to leave message for patient to call back, phone rings no answer/no machine. Please schedule her for a 1 month follow up with [facility] the week prior.  Harrell Lark, MA

## 2019-11-22 DIAGNOSIS — L820 Inflamed seborrheic keratosis: Secondary | ICD-10-CM | POA: Diagnosis not present

## 2019-11-22 DIAGNOSIS — L720 Epidermal cyst: Secondary | ICD-10-CM | POA: Diagnosis not present

## 2019-11-22 DIAGNOSIS — L738 Other specified follicular disorders: Secondary | ICD-10-CM | POA: Diagnosis not present

## 2019-11-29 ENCOUNTER — Ambulatory Visit: Payer: Medicare Other | Attending: Family Medicine

## 2019-11-29 DIAGNOSIS — Z23 Encounter for immunization: Secondary | ICD-10-CM

## 2019-11-29 NOTE — Progress Notes (Signed)
Patient WAS wearing a surgical mask  Droplet precautions were followed when caring for the patient.   PPE used by provider during encounter: Surgical mask    The patient is eligible to receive the COVID-19 Vaccine at this time: Yes    The patient has received a COVID vaccine previously: No      The patient acknowledges receipt of the Fact Sheet for Recipients and Caregivers for the Moderna vaccine, which includes information about the risks and benefits of the vaccine and available alternatives. The patient was informed the FDA has authorized the emergency use of the COVID-19 Vaccine, which is not an FDA-approved vaccine and that the patient may accept or refuse the vaccine.    Pre-Screening Documentation:      COVID-19 Immunization Allergies  1. Have you ever had a severe allergic reaction to a previous dose of the COVID-19 Vaccine or to any ingredient of the COVID-19 Vaccine? (Review ingredients in the Fact Sheet for Recipients and Caregivers): No  2. Have you ever had an immediate allergic reaction of any severity within 4 hours of receiving a previous dose of the COVID-19 Vaccine or any of its ingredients?: No  3. Have you ever had an immediate allergic reaction of any severity to polysorbate?: No  Did the patient answer yes to any of questions 1-3?: No (proceed to question 4)      COVID-19 Immunization Clinical Considerations  4.Are you pregnant or breastfeeding?: No  5.Do you have a weakened immune system caused by something such as HIV infection or cancer, or do you take medications that affect the immune system?: No  6. Do you have a bleeding disorder or are you taking blood thinners? : No  Did the patient answer yes to any of questions 4-6? : No (proceed to question 7)      COVID-19 Immunization Deferral Considerations  7. Have you received any other vaccines within the past 14 days? : No  8. Have you tested positive for COVID-19 in the last 14 days? : No  9. Have you received a monoclonal antibody or  convalescent plasma treatment for COVID-19 in the past 90 days? : No  10. Have you had a confirmed COVID-19 exposure in the past 14 days?: No  11. Are you feeling sick today? : No  Did the patient answer yes to any of questions 7-11?: No (proceed to question 12)      COVID-19 Immunization Dermal Filler Consideration  12. Have you received a dermal filler injection?: No (proceed to question 13)      COVID-19 Immunization Observation Considerations  13. Do you have a history of immediate allergic reaction of any severity to a vaccine?: No  14. Do you have a history of immediate allergic reaction of any severity to an injectable medication?: No  15. Do you have a history of severe allergic reaction due to any cause? : No  Did the patient answer yes to any of questions 13-15? : No  If no, the patient was informed a 15-minute observation period is recommended after the vaccine to watch for a reaction.: Confirmed        All patient questions were answered and the patient agrees to receive the COVID-19 vaccine today.     Vaccine prepared and administered according to the current Emergency Use Authorization and Fact Sheet for Healthcare Providers Administering Vaccine.    Patient given vaccine card and discharge instructions with information about the Fact Sheet for Recipients and Caregivers and the  v-safe program. Patient directed to observation area.     Ronnell Freshwater, RN

## 2019-12-18 ENCOUNTER — Ambulatory Visit: Payer: Medicare Other | Attending: Internal Medicine

## 2019-12-18 DIAGNOSIS — Z23 Encounter for immunization: Secondary | ICD-10-CM

## 2019-12-18 NOTE — Progress Notes (Signed)
   Covid-19 Vaccination Clinic  Name:  Suzanne Galloway    MRN: [id number] DOB: 08-10-1941  12/18/2019  [name] was observed post Covid-19 immunization for 15 minutes without incidence. She was provided with Vaccine Information Sheet and instruction to access the V-Safe system.   [name] was instructed to call 911 with any severe reactions post vaccine: Marland Kitchen Difficulty breathing  . Swelling of your face and throat  . A fast heartbeat  . A bad rash all over your body  . Dizziness and weakness    Immunizations Administered    Name Date Dose VIS Date Route   Pfizer COVID-19 Vaccine 12/18/2019 12:11 PM 0.3 mL 09/29/2019 Intramuscular   Manufacturer: [place]   Lot: [id number]   [place]: [id number]

## 2019-12-21 DIAGNOSIS — E782 Mixed hyperlipidemia: Secondary | ICD-10-CM | POA: Diagnosis not present

## 2019-12-21 DIAGNOSIS — E039 Hypothyroidism, unspecified: Secondary | ICD-10-CM | POA: Diagnosis not present

## 2019-12-21 DIAGNOSIS — M19049 Primary osteoarthritis, unspecified hand: Secondary | ICD-10-CM | POA: Diagnosis not present

## 2019-12-21 DIAGNOSIS — J45909 Unspecified asthma, uncomplicated: Secondary | ICD-10-CM | POA: Diagnosis not present

## 2019-12-27 ENCOUNTER — Ambulatory Visit: Payer: Medicare Other | Attending: Family Medicine

## 2019-12-27 DIAGNOSIS — Z23 Encounter for immunization: Secondary | ICD-10-CM

## 2019-12-27 NOTE — Progress Notes (Signed)
Patient WAS wearing a surgical mask  Droplet precautions were followed when caring for the patient.   PPE used by provider during encounter: Surgical mask, Face Shield/Goggles and Gloves    The patient is eligible to receive the COVID-19 Vaccine at this time: Yes    The patient has received a COVID vaccine previously: Yes   Immunization History   Administered Date(s) Administered    COVID-19 mRNA PF 100 mcg/0.5 mL (Moderna)   11/29/2019            The patient acknowledges receipt of the Fact Sheet for Recipients and Caregivers for the Moderna vaccine, which includes information about the risks and benefits of the vaccine and available alternatives. The patient was informed the FDA has authorized the emergency use of the COVID-19 Vaccine, which is not an FDA-approved vaccine and that the patient may accept or refuse the vaccine.    Pre-Screening Documentation:      COVID-19 Immunization Allergies  1. Have you ever had a severe allergic reaction to a previous dose of the COVID-19 Vaccine or to any ingredient of the COVID-19 Vaccine? (Review ingredients in the Fact Sheet for Recipients and Caregivers): No  2. Have you ever had an immediate allergic reaction of any severity within 4 hours of receiving a previous dose of the COVID-19 Vaccine or any of its ingredients?: No  3. Have you ever had an immediate allergic reaction of any severity to polysorbate or polyethylene glycol [PEG]?: No  Did the patient answer yes to any of questions 1-3?: No (proceed to question 4)      COVID-19 Immunization Clinical Considerations  4.Are you pregnant or breastfeeding?: No  5.Do you have a weakened immune system caused by something such as HIV infection or cancer, or do you take medications that affect the immune system?: No  6. Do you have a bleeding disorder or are you taking blood thinners? : No  Did the patient answer yes to any of questions 4-6? : No (proceed to question 7)      COVID-19 Immunization Deferral  Considerations  7. Have you received any other vaccines within the past 14 days? : No  8. Have you had a confirmed COVID-19 exposure in the past 14 days?: No  9. Have you tested positive for COVID-19 in the last 14 days? : No  10. Are you feeling sick today? : No  Did the patient answer yes to any of questions 7-10?: No (proceed to question 11)       COVID-19 Immunization Antibody Deferral Considerations  11. In the past 90 days, have you tested positive for COVID-19 and recieved treatment?: No (proceed to question 12)       COVID-19 Immunization Dermal Filler Consideration  12. Have you received a dermal filler injection?: No (proceed to question 13)      COVID-19 Immunization Observation Considerations  13. Do you have a history of immediate allergic reaction of any severity to a vaccine?: No  14. Do you have a history of immediate allergic reaction of any severity to an injectable medication?: No  15. Do you have a history of severe allergic reaction due to any cause? : No  Did the patient answer yes to any of questions 13-15? : No  If no, the patient was informed a 15-minute observation period is recommended after the vaccine to watch for a reaction.: Confirmed        All patient questions were answered and the patient agrees to receive the   COVID-19 vaccine today.     Vaccine prepared and administered according to the current Emergency Use Authorization and Fact Sheet for Healthcare Providers Administering Vaccine.    Patient given vaccine card and discharge instructions with information about the Fact Sheet for Recipients and Caregivers and the v-safe program. Patient directed to observation area.     Adien Kimmel, LVN

## 2020-01-16 ENCOUNTER — Ambulatory Visit: Payer: Medicare Other | Attending: Internal Medicine

## 2020-01-16 DIAGNOSIS — E039 Hypothyroidism, unspecified: Secondary | ICD-10-CM | POA: Insufficient documentation

## 2020-01-16 DIAGNOSIS — Z23 Encounter for immunization: Secondary | ICD-10-CM

## 2020-01-16 LAB — THYROID STIMULATING HORMONE: Thyroid Stimulating Hormone: 1.31 u[IU]/mL (ref 0.35–3.30)

## 2020-01-16 NOTE — Progress Notes (Signed)
   Covid-19 Vaccination Clinic  Name:  Suzanne Galloway    MRN: [id number] DOB: 1941/07/19  01/16/2020  [name] was observed post Covid-19 immunization for 15 minutes without incident. She was provided with Vaccine Information Sheet and instruction to access the V-Safe system.   [name] was instructed to call 911 with any severe reactions post vaccine: Marland Kitchen Difficulty breathing  . Swelling of face and throat  . A fast heartbeat  . A bad rash all over body  . Dizziness and weakness   Immunizations Administered    Name Date Dose VIS Date Route   Pfizer COVID-19 Vaccine 01/16/2020 11:52 AM 0.3 mL 09/29/2019 Intramuscular   Manufacturer: [place]   Lot: [id number]   [place]: [id number]

## 2020-01-23 ENCOUNTER — Ambulatory Visit: Payer: Medicare Other | Admitting: Internal Medicine

## 2020-01-23 DIAGNOSIS — E7800 Pure hypercholesterolemia, unspecified: Secondary | ICD-10-CM

## 2020-01-23 DIAGNOSIS — E039 Hypothyroidism, unspecified: Secondary | ICD-10-CM

## 2020-01-23 NOTE — Progress Notes (Signed)
I performed a virtual check with the patient via telephone for follow-up on the test results I obtained verbal consent from the patient to perform this clinical encounter    Time spent on discussion 15 minutes      No symptoms of hypo or hyperthyroidism: no decreased or increased weight, no feeling cold/chilly or excessively warm, no diarrhea or constipation, no undue sweatiness, anxiety or palpitations.   Lab Results   Lab Name Value Date/Time    TSH 1.31 01/16/2020 07:57 AM    TSH 1.89 05/09/2014 09:44 AM    FT4 1.27 06/17/2009 08:16 AM     She declines to have a DEXA scan, understands the risk of osteoporosis and fractures    Hyperlipidemia,    management discussed with patient, shehas  been working on  diet, she has  been doing regular exercise. Patient verbalised understanding about the significance for regular exercise and low fat diet in management of this problem        Objective-  General Appearance: healthy, alert, no distress, pleasant affect, cooperative.  Mental Status: Appearance/Cooperation: in no apparent distress and well groomed and dressed, pleasant attitude and normal behavior with a normal eye contact        ASSESMENT AND PLAN     (E78.00) Pure hypercholesterolemia  (primary encounter diagnosis)  Comment:   Plan: Comprehensive Metabolic Panel, CBC with         Differential, Lipid Panel            (E03.9) Acquired hypothyroidism  Comment: Improved TSH  Plan: Thyroid Stimulating Hormone        Continue current treatment     I did review patient's past medical and family/social history, no changes noted.  Barriers to Learning assessed: none. Patient verbalizes understanding of teaching and instructions.  Zettie Cooley MD      Electronically Signed By:  Zettie Cooley, MD  Physician associate   [facility] [place]  [phone]

## 2020-01-24 DIAGNOSIS — K2100 Gastro-esophageal reflux disease with esophagitis, without bleeding: Secondary | ICD-10-CM | POA: Diagnosis not present

## 2020-01-24 DIAGNOSIS — K580 Irritable bowel syndrome with diarrhea: Secondary | ICD-10-CM | POA: Diagnosis not present

## 2020-01-24 DIAGNOSIS — K2970 Gastritis, unspecified, without bleeding: Secondary | ICD-10-CM | POA: Diagnosis not present

## 2020-02-08 DIAGNOSIS — K580 Irritable bowel syndrome with diarrhea: Secondary | ICD-10-CM | POA: Diagnosis not present

## 2020-02-08 DIAGNOSIS — K2950 Unspecified chronic gastritis without bleeding: Secondary | ICD-10-CM | POA: Diagnosis not present

## 2020-02-08 DIAGNOSIS — J45909 Unspecified asthma, uncomplicated: Secondary | ICD-10-CM | POA: Diagnosis not present

## 2020-02-08 DIAGNOSIS — K2100 Gastro-esophageal reflux disease with esophagitis, without bleeding: Secondary | ICD-10-CM | POA: Diagnosis not present

## 2020-02-15 DIAGNOSIS — I10 Essential (primary) hypertension: Secondary | ICD-10-CM | POA: Diagnosis not present

## 2020-02-15 DIAGNOSIS — F3342 Major depressive disorder, recurrent, in full remission: Secondary | ICD-10-CM | POA: Diagnosis not present

## 2020-02-15 DIAGNOSIS — K580 Irritable bowel syndrome with diarrhea: Secondary | ICD-10-CM | POA: Diagnosis not present

## 2020-02-23 DIAGNOSIS — K2950 Unspecified chronic gastritis without bleeding: Secondary | ICD-10-CM | POA: Diagnosis not present

## 2020-02-23 DIAGNOSIS — Z1211 Encounter for screening for malignant neoplasm of colon: Secondary | ICD-10-CM | POA: Diagnosis not present

## 2020-03-08 DIAGNOSIS — N289 Disorder of kidney and ureter, unspecified: Secondary | ICD-10-CM | POA: Diagnosis not present

## 2020-03-09 DIAGNOSIS — I083 Combined rheumatic disorders of mitral, aortic and tricuspid valves: Secondary | ICD-10-CM | POA: Diagnosis not present

## 2020-03-09 DIAGNOSIS — J9601 Acute respiratory failure with hypoxia: Secondary | ICD-10-CM | POA: Diagnosis not present

## 2020-03-09 DIAGNOSIS — I110 Hypertensive heart disease with heart failure: Secondary | ICD-10-CM | POA: Diagnosis not present

## 2020-03-09 DIAGNOSIS — F419 Anxiety disorder, unspecified: Secondary | ICD-10-CM | POA: Diagnosis not present

## 2020-03-09 DIAGNOSIS — I2102 ST elevation (STEMI) myocardial infarction involving left anterior descending coronary artery: Secondary | ICD-10-CM | POA: Diagnosis not present

## 2020-03-09 DIAGNOSIS — I214 Non-ST elevation (NSTEMI) myocardial infarction: Secondary | ICD-10-CM | POA: Diagnosis not present

## 2020-03-09 DIAGNOSIS — I2109 ST elevation (STEMI) myocardial infarction involving other coronary artery of anterior wall: Secondary | ICD-10-CM | POA: Diagnosis not present

## 2020-03-09 DIAGNOSIS — J811 Chronic pulmonary edema: Secondary | ICD-10-CM | POA: Diagnosis not present

## 2020-03-09 DIAGNOSIS — I5023 Acute on chronic systolic (congestive) heart failure: Secondary | ICD-10-CM | POA: Diagnosis not present

## 2020-03-09 DIAGNOSIS — R0602 Shortness of breath: Secondary | ICD-10-CM | POA: Diagnosis not present

## 2020-03-09 DIAGNOSIS — I517 Cardiomegaly: Secondary | ICD-10-CM | POA: Diagnosis not present

## 2020-03-09 DIAGNOSIS — I2510 Atherosclerotic heart disease of native coronary artery without angina pectoris: Secondary | ICD-10-CM | POA: Diagnosis not present

## 2020-03-09 DIAGNOSIS — I2582 Chronic total occlusion of coronary artery: Secondary | ICD-10-CM | POA: Diagnosis not present

## 2020-03-09 DIAGNOSIS — J90 Pleural effusion, not elsewhere classified: Secondary | ICD-10-CM | POA: Diagnosis not present

## 2020-03-09 DIAGNOSIS — I462 Cardiac arrest due to underlying cardiac condition: Secondary | ICD-10-CM | POA: Diagnosis not present

## 2020-03-09 DIAGNOSIS — R918 Other nonspecific abnormal finding of lung field: Secondary | ICD-10-CM | POA: Diagnosis not present

## 2020-03-09 DIAGNOSIS — I4901 Ventricular fibrillation: Secondary | ICD-10-CM | POA: Diagnosis not present

## 2020-03-09 DIAGNOSIS — E785 Hyperlipidemia, unspecified: Secondary | ICD-10-CM | POA: Diagnosis not present

## 2020-03-09 DIAGNOSIS — R0781 Pleurodynia: Secondary | ICD-10-CM | POA: Diagnosis not present

## 2020-03-09 DIAGNOSIS — F329 Major depressive disorder, single episode, unspecified: Secondary | ICD-10-CM | POA: Diagnosis not present

## 2020-03-09 DIAGNOSIS — E039 Hypothyroidism, unspecified: Secondary | ICD-10-CM | POA: Diagnosis not present

## 2020-03-09 DIAGNOSIS — I219 Acute myocardial infarction, unspecified: Secondary | ICD-10-CM | POA: Insufficient documentation

## 2020-03-09 DIAGNOSIS — I469 Cardiac arrest, cause unspecified: Secondary | ICD-10-CM | POA: Diagnosis not present

## 2020-03-09 DIAGNOSIS — G40109 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, not intractable, without status epilepticus: Secondary | ICD-10-CM | POA: Diagnosis not present

## 2020-03-09 DIAGNOSIS — R197 Diarrhea, unspecified: Secondary | ICD-10-CM | POA: Diagnosis not present

## 2020-03-09 DIAGNOSIS — I255 Ischemic cardiomyopathy: Secondary | ICD-10-CM | POA: Diagnosis not present

## 2020-03-09 DIAGNOSIS — I2584 Coronary atherosclerosis due to calcified coronary lesion: Secondary | ICD-10-CM | POA: Diagnosis not present

## 2020-03-09 DIAGNOSIS — J449 Chronic obstructive pulmonary disease, unspecified: Secondary | ICD-10-CM | POA: Diagnosis not present

## 2020-03-09 DIAGNOSIS — R079 Chest pain, unspecified: Secondary | ICD-10-CM | POA: Diagnosis not present

## 2020-03-09 DIAGNOSIS — L7632 Postprocedural hematoma of skin and subcutaneous tissue following other procedure: Secondary | ICD-10-CM | POA: Diagnosis not present

## 2020-03-09 DIAGNOSIS — K219 Gastro-esophageal reflux disease without esophagitis: Secondary | ICD-10-CM | POA: Diagnosis not present

## 2020-03-09 HISTORY — DX: Acute myocardial infarction, unspecified: I21.9

## 2020-03-10 DIAGNOSIS — I2510 Atherosclerotic heart disease of native coronary artery without angina pectoris: Secondary | ICD-10-CM | POA: Insufficient documentation

## 2020-03-10 DIAGNOSIS — I255 Ischemic cardiomyopathy: Secondary | ICD-10-CM

## 2020-03-10 DIAGNOSIS — Z955 Presence of coronary angioplasty implant and graft: Secondary | ICD-10-CM | POA: Insufficient documentation

## 2020-03-10 HISTORY — DX: Ischemic cardiomyopathy: I25.5

## 2020-03-10 HISTORY — DX: Presence of coronary angioplasty implant and graft: Z95.5

## 2020-03-10 HISTORY — DX: Atherosclerotic heart disease of native coronary artery without angina pectoris: I25.10

## 2020-03-19 ENCOUNTER — Ambulatory Visit: Payer: Self-pay | Admitting: *Deleted

## 2020-03-19 ENCOUNTER — Other Ambulatory Visit: Payer: Self-pay | Admitting: *Deleted

## 2020-03-20 DIAGNOSIS — I10 Essential (primary) hypertension: Secondary | ICD-10-CM | POA: Diagnosis not present

## 2020-03-20 DIAGNOSIS — Z79890 Hormone replacement therapy (postmenopausal): Secondary | ICD-10-CM | POA: Diagnosis not present

## 2020-03-20 DIAGNOSIS — I252 Old myocardial infarction: Secondary | ICD-10-CM | POA: Diagnosis not present

## 2020-03-20 DIAGNOSIS — K219 Gastro-esophageal reflux disease without esophagitis: Secondary | ICD-10-CM | POA: Diagnosis not present

## 2020-03-20 DIAGNOSIS — Z79899 Other long term (current) drug therapy: Secondary | ICD-10-CM | POA: Diagnosis not present

## 2020-03-20 DIAGNOSIS — R911 Solitary pulmonary nodule: Secondary | ICD-10-CM | POA: Diagnosis not present

## 2020-03-20 DIAGNOSIS — Z7982 Long term (current) use of aspirin: Secondary | ICD-10-CM | POA: Diagnosis not present

## 2020-03-20 DIAGNOSIS — J449 Chronic obstructive pulmonary disease, unspecified: Secondary | ICD-10-CM | POA: Diagnosis not present

## 2020-03-20 DIAGNOSIS — R0602 Shortness of breath: Secondary | ICD-10-CM | POA: Diagnosis not present

## 2020-03-20 DIAGNOSIS — E039 Hypothyroidism, unspecified: Secondary | ICD-10-CM | POA: Diagnosis not present

## 2020-03-20 DIAGNOSIS — F419 Anxiety disorder, unspecified: Secondary | ICD-10-CM | POA: Diagnosis not present

## 2020-03-20 DIAGNOSIS — I213 ST elevation (STEMI) myocardial infarction of unspecified site: Secondary | ICD-10-CM | POA: Diagnosis not present

## 2020-03-20 DIAGNOSIS — R0789 Other chest pain: Secondary | ICD-10-CM | POA: Diagnosis not present

## 2020-03-20 DIAGNOSIS — J90 Pleural effusion, not elsewhere classified: Secondary | ICD-10-CM | POA: Diagnosis not present

## 2020-03-20 DIAGNOSIS — I2510 Atherosclerotic heart disease of native coronary artery without angina pectoris: Secondary | ICD-10-CM | POA: Diagnosis not present

## 2020-03-20 DIAGNOSIS — Z955 Presence of coronary angioplasty implant and graft: Secondary | ICD-10-CM | POA: Diagnosis not present

## 2020-03-20 DIAGNOSIS — R079 Chest pain, unspecified: Secondary | ICD-10-CM | POA: Diagnosis not present

## 2020-03-20 DIAGNOSIS — I255 Ischemic cardiomyopathy: Secondary | ICD-10-CM | POA: Diagnosis not present

## 2020-03-20 DIAGNOSIS — I499 Cardiac arrhythmia, unspecified: Secondary | ICD-10-CM | POA: Diagnosis not present

## 2020-03-20 DIAGNOSIS — R918 Other nonspecific abnormal finding of lung field: Secondary | ICD-10-CM | POA: Diagnosis not present

## 2020-03-20 HISTORY — DX: Other chest pain: R07.89

## 2020-03-21 DIAGNOSIS — I5189 Other ill-defined heart diseases: Secondary | ICD-10-CM | POA: Diagnosis not present

## 2020-03-21 DIAGNOSIS — R0789 Other chest pain: Secondary | ICD-10-CM | POA: Diagnosis not present

## 2020-03-21 DIAGNOSIS — I313 Pericardial effusion (noninflammatory): Secondary | ICD-10-CM | POA: Diagnosis not present

## 2020-03-21 MED ORDER — ENOXAPARIN SODIUM 40 MG/0.4ML ~~LOC~~ SOLN
40.00 | SUBCUTANEOUS | Status: DC
Start: 2020-03-21 — End: 2020-03-21

## 2020-03-21 MED ORDER — POTASSIUM CHLORIDE CRYS ER 20 MEQ PO TBCR
20.00 | EXTENDED_RELEASE_TABLET | ORAL | Status: DC
Start: 2020-03-21 — End: 2020-03-21

## 2020-03-21 MED ORDER — ASPIRIN 81 MG PO CHEW
81.00 | CHEWABLE_TABLET | ORAL | Status: DC
Start: 2020-03-22 — End: 2020-03-21

## 2020-03-21 MED ORDER — LEVOTHYROXINE SODIUM 75 MCG PO TABS
75.00 | ORAL_TABLET | ORAL | Status: DC
Start: 2020-03-22 — End: 2020-03-21

## 2020-03-21 MED ORDER — SACUBITRIL-VALSARTAN 24-26 MG PO TABS
1.00 | ORAL_TABLET | ORAL | Status: DC
Start: 2020-03-21 — End: 2020-03-21

## 2020-03-21 MED ORDER — GENERIC EXTERNAL MEDICATION
Status: DC
Start: ? — End: 2020-03-21

## 2020-03-21 MED ORDER — ZOLPIDEM TARTRATE 5 MG PO TABS
5.00 | ORAL_TABLET | ORAL | Status: DC
Start: ? — End: 2020-03-21

## 2020-03-21 MED ORDER — FAMOTIDINE 20 MG PO TABS
40.00 | ORAL_TABLET | ORAL | Status: DC
Start: 2020-03-21 — End: 2020-03-21

## 2020-03-21 MED ORDER — SPIRONOLACTONE 25 MG PO TABS
25.00 | ORAL_TABLET | ORAL | Status: DC
Start: 2020-03-22 — End: 2020-03-21

## 2020-03-21 MED ORDER — SENNOSIDES-DOCUSATE SODIUM 8.6-50 MG PO TABS
1.00 | ORAL_TABLET | ORAL | Status: DC
Start: ? — End: 2020-03-21

## 2020-03-21 MED ORDER — SUCRALFATE 1 G PO TABS
1.00 | ORAL_TABLET | ORAL | Status: DC
Start: 2020-03-21 — End: 2020-03-21

## 2020-03-21 MED ORDER — MUPIROCIN 2 % EX OINT
TOPICAL_OINTMENT | CUTANEOUS | Status: DC
Start: 2020-03-21 — End: 2020-03-21

## 2020-03-21 MED ORDER — ZONISAMIDE 100 MG PO CAPS
200.00 | ORAL_CAPSULE | ORAL | Status: DC
Start: 2020-03-21 — End: 2020-03-21

## 2020-03-21 MED ORDER — ESCITALOPRAM OXALATE 10 MG PO TABS
20.00 | ORAL_TABLET | ORAL | Status: DC
Start: 2020-03-22 — End: 2020-03-21

## 2020-03-21 MED ORDER — ALPRAZOLAM 0.25 MG PO TABS
0.25 | ORAL_TABLET | ORAL | Status: DC
Start: ? — End: 2020-03-21

## 2020-03-21 MED ORDER — MORPHINE SULFATE (PF) 2 MG/ML IV SOLN
2.00 | INTRAVENOUS | Status: DC
Start: ? — End: 2020-03-21

## 2020-03-21 MED ORDER — NITROGLYCERIN 0.4 MG SL SUBL
0.40 | SUBLINGUAL_TABLET | SUBLINGUAL | Status: DC
Start: ? — End: 2020-03-21

## 2020-03-21 MED ORDER — SODIUM CHLORIDE 0.9 % IV SOLN
10.00 | INTRAVENOUS | Status: DC
Start: ? — End: 2020-03-21

## 2020-03-21 MED ORDER — TICAGRELOR 90 MG PO TABS
90.00 | ORAL_TABLET | ORAL | Status: DC
Start: 2020-03-21 — End: 2020-03-21

## 2020-03-21 MED ORDER — METOPROLOL SUCCINATE ER 25 MG PO TB24
12.50 | ORAL_TABLET | ORAL | Status: DC
Start: 2020-03-22 — End: 2020-03-21

## 2020-03-21 MED ORDER — DIPHENHYDRAMINE HCL 25 MG PO CAPS
25.00 | ORAL_CAPSULE | ORAL | Status: DC
Start: ? — End: 2020-03-21

## 2020-03-21 MED ORDER — LACOSAMIDE 50 MG PO TABS
50.00 | ORAL_TABLET | ORAL | Status: DC
Start: 2020-03-22 — End: 2020-03-21

## 2020-03-21 MED ORDER — ISOSORBIDE MONONITRATE ER 30 MG PO TB24
30.00 | ORAL_TABLET | ORAL | Status: DC
Start: 2020-03-21 — End: 2020-03-21

## 2020-03-21 MED ORDER — ATORVASTATIN CALCIUM 80 MG PO TABS
80.00 | ORAL_TABLET | ORAL | Status: DC
Start: 2020-03-21 — End: 2020-03-21

## 2020-03-21 MED ORDER — LOPERAMIDE HCL 2 MG PO CAPS
2.00 | ORAL_CAPSULE | ORAL | Status: DC
Start: ? — End: 2020-03-21

## 2020-03-22 DIAGNOSIS — R0789 Other chest pain: Secondary | ICD-10-CM | POA: Diagnosis not present

## 2020-03-27 ENCOUNTER — Ambulatory Visit: Payer: Medicare Other | Admitting: Legal Medicine

## 2020-03-27 ENCOUNTER — Encounter: Payer: Self-pay | Admitting: Legal Medicine

## 2020-03-27 VITALS — BP 110/64 | HR 67 | Temp 97.6°F | Ht 66.0 in | Wt 177.0 lb

## 2020-03-27 DIAGNOSIS — G8929 Other chronic pain: Secondary | ICD-10-CM

## 2020-03-27 DIAGNOSIS — M25562 Pain in left knee: Secondary | ICD-10-CM

## 2020-03-27 NOTE — Nursing Note (Signed)
Vital signs taken, allergies verified, screened for pain, pharmacy verified, and verified immunization status. have asked the patient if they have received any medical treatment or consultation outside of the [facility].  The patient has indicated no to receiving consults or services outside the  Health system.  If yes, we have requested information to ascertain these results.   Patient is here with self  Linsey Arteaga, MA

## 2020-03-27 NOTE — Progress Notes (Signed)
Chief Complaint   Patient presents with    Knee Pain     left knee pain x 3 mo       Tammy [name] is a 79yr old female who presents with chief complaint of; I am having pain in my left knee.     Patient WAS wearing a surgical mask  Droplet precautions were followed when caring for the patient.   PPE used by provider during encounter: Surgical mask     Started with; left knee pain , 3  months ago.  Symptom onset was rapid.  Now the worst problem is; no injury.   Course of symptoms over time has been worsening.   Severity is described as moderate and severe.     Body temperatures: no fever apparent.    REVIEW OF SYSTEMS:  Constitutional: negative.  Musculoskeletal: joint pain.  Neuro: negative.    Patient Active Problem List   Diagnosis    Hypothyroidism    Routine general medical examination at a health care facility    Other malaise and fatigue    left supraclavicular fullness    Loss of weight    Pure hypercholesterolemia    Pulmonary embolism (HCC)    Atrial fibrillation (HCC)    Long term current use of anticoagulant therapy    PE (physical exam)    BMI 28.0-28.9,adult    Vitamin D deficiency     Past Surgical History:   Procedure Laterality Date    COLONOSCOPY      declined in past    NO SURGICAL HISTORY         ACTIVE MEDICATIONS (patient reports taking):  Outpatient Medications Marked as Taking for the 03/27/20 encounter (Office Visit) with Lorenda Hatchet, MD   Medication Sig Dispense Refill    Atorvastatin (LIPITOR) 40 mg tablet Take 1 tablet by mouth every day. 90 tablet 3    LevoTHYROxine 112 mcg Tablet One pill daily (thyroid) 90 tablet 2       ALLERGIES:    Enoxaparin    Rash    Comment:When she had PE, diffuse rash, biopsied    PHYSICAL EXAM:   BP 110/64 (SITE: left arm, Orthostatic Position: sitting, Cuff Size: regular)   Pulse 67   Temp 36.4 C (97.6 F) (Temporal)   Ht 1.676 m (5\' 6" )   Wt 80.3 kg (177 lb)   SpO2 96%   BMI 28.57 kg/m   GEN:   The patient appears in no  apparent distress and well developed and well nourished.  MUSCULOSKELETAL:    Knee exam;  Left Knee Exam:  Inspection:no effusion.  Palpation: medial joint line tenderness.  Passive ROM: normal flexion and extension  Ligaments and Meniscus exam: Lachman no laxity, Valgus stress no laxity, Varus stress no laxity and MacMurray maneuver negative.  Gait: antalgic.  NEUROLOGICAL:  muscle tone and strength normal and symmetric, coordination intact bilaterally.      EXAM: KNEES STAND AP, BI + KNEE 1 OR 2 VW, LT + KNEE 1 OR 2 VW, RT   EXAM DATE: 05/08/2014 11:04 AM   HISTORY: Signs/Symptoms: Chronic knee pain arthritis  COMPARISON: None   FINDINGS:    Frontal and lateral views of the knees. At the right knee, there is mild   chondrocalcinosis both medially and laterally. Mild narrowing of the medial   knee joint space. Remodeling of the posterior surface of patella suggesting   higher grade chondromalacia here. Small joint effusion.   Left knee shows mild  chondrocalcinosis and mild osteophyte formation. Small   joint effusion. It is difficult to assess patellofemoral joint space   without merchant or sunrise views.     IMPRESSION:     Chondrocalcinosis and arthrosis bilateral knees. Findings suggestive of   higher grade chondromalacia of the right patella. These findings together   are suggestive of CPPD arthropathy.   Final Report Electronically Signed By: Amie Portland, M.D. on 05/08/2014       ASSESSMENT & PLAN:  (M25.562,  G89.29) Chronic pain of left knee  (primary encounter diagnosis)  Comment: new problem additional workup planned;   Plan: KNEE 3 VIEWS, LEFT, Orthopedic-General Referral        Instructions; PRICE treatments.  Stop heating.       Return if symptoms worsen or fail to improve.    Barriers to Learning assessed: none. Patient verbalizes understanding of teaching and instructions.  Patient instructed to seek medical reevaluation if treatment is not as effective as anticipated, condition worsens, or  has side effects from medication.      Electronically signed by:  Tillie Rung, MD  Diplomate of American Board of Family Practice  Tasley Walker PCN  Phone: [phone]

## 2020-03-27 NOTE — Patient Instructions (Signed)
Ibuprofen, over the counter, 200 mg tabs.  (Motrin, Advil)  Can take up to 800 mg every 8 hours, or 600 mg every 6 hours, or 400 mg every 4 hours,  as needed for pain, fever, inflammation.    OR;    Naproxen, over the counter, 220 mg tabs.  (Aleve)  Can take 2 tabs every 12 hours for pain, fever, inflammation.    Watch out for stomach upset/pain; some people are very sensitive to the decrease in acid protection in the stomach that happens with these medications.     Can add if needed;  Tylenol (acetaminophen);  1000 mg every 8 hours as needed.

## 2020-03-28 ENCOUNTER — Ambulatory Visit
Admission: RE | Admit: 2020-03-28 | Discharge: 2020-03-28 | Disposition: A | Discharge status: Home / Self Care | Payer: Medicare Other | Source: Ambulatory Visit | Attending: Legal Medicine | Admitting: Legal Medicine

## 2020-03-28 DIAGNOSIS — M1712 Unilateral primary osteoarthritis, left knee: Secondary | ICD-10-CM

## 2020-03-28 DIAGNOSIS — M25562 Pain in left knee: Secondary | ICD-10-CM

## 2020-03-28 DIAGNOSIS — G8929 Other chronic pain: Secondary | ICD-10-CM

## 2020-04-01 ENCOUNTER — Other Ambulatory Visit: Payer: Self-pay | Admitting: Diagnostic Neuroimaging

## 2020-04-01 DIAGNOSIS — Z955 Presence of coronary angioplasty implant and graft: Secondary | ICD-10-CM | POA: Diagnosis not present

## 2020-04-01 DIAGNOSIS — I255 Ischemic cardiomyopathy: Secondary | ICD-10-CM | POA: Diagnosis not present

## 2020-04-01 DIAGNOSIS — G40109 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, not intractable, without status epilepticus: Secondary | ICD-10-CM

## 2020-04-01 DIAGNOSIS — I2510 Atherosclerotic heart disease of native coronary artery without angina pectoris: Secondary | ICD-10-CM | POA: Diagnosis not present

## 2020-04-03 ENCOUNTER — Ambulatory Visit: Payer: Medicare Other | Admitting: Orthopaedic Surgery

## 2020-04-03 ENCOUNTER — Encounter: Payer: Self-pay | Admitting: Orthopaedic Surgery

## 2020-04-03 VITALS — HR 85 | Temp 98.9°F | Ht 65.98 in | Wt 176.0 lb

## 2020-04-03 DIAGNOSIS — M1712 Unilateral primary osteoarthritis, left knee: Secondary | ICD-10-CM

## 2020-04-03 NOTE — Progress Notes (Addendum)
04/03/2020  Orthopaedic Clinic History and Physical    Dear Dr. Everrett Coombe, Rogelia Rohrer, MD:    HPI: I had the pleasure of seeing your patient, Tammy [name], in clinic today in consultation for left knee pain.  As you know, Tammy [name] is a 79 yo retired female with mild to moderate constant left knee pain located throughout, does not radiate.  Onset: 3 to 4 years ago, insidious onset, has gone significantly worse in the last several months, worse with use, yard work, walking, standing, getting up and down, stairs, night and is relieved with rest, ice, NSAIDs. Pain quality: Aching, sharp and stabbing, numbing.  Patient does not have symptoms of catching, clicking, locking, or popping. Buckling and weakness. Patient has pain with stairs and sitting for prolonged periods of time.  Medications used for this pain includes ibuprofen or Aleve with some benefit.  Previous treatments have included no injections, no therapy and + ice and heat.  Tried a knee sleeve that was too tight.  No knee brace.  I had seen patient 6 years ago for the other knee and shoulder and steroid injections were done at that time.  Has not had recurrence of the right knee pain.  Patient's current activity level consists of daily activities working around the home and garden, walking dog.    Patient Active Problem List   Diagnosis    Hypothyroidism    Routine general medical examination at a health care facility    Other malaise and fatigue    left supraclavicular fullness    Loss of weight    Pure hypercholesterolemia    Pulmonary embolism (HCC)    Atrial fibrillation ([place])    Long term current use of anticoagulant therapy    PE (physical exam)    BMI 28.0-28.9,adult    Vitamin D deficiency     Past Surgical History:   Procedure Laterality Date    COLONOSCOPY      declined in past    NO SURGICAL HISTORY         Allergies   Allergen Reactions    Enoxaparin Rash     When she had PE, diffuse rash, biopsied         Current Outpatient  Medications on File Prior to Visit   Medication Sig Dispense Refill    Atorvastatin (LIPITOR) 40 mg tablet Take 1 tablet by mouth every day. 90 tablet 3    LevoTHYROxine 112 mcg Tablet One pill daily (thyroid) 90 tablet 2     No current facility-administered medications on file prior to visit.       Family History   Problem Relation Name Age of Onset    Cancer Sister          oral    Heart Sister      Heart Mother        Social History     Occupational History    Occupation: used to work with animals   Tobacco Use    Smoking status: Never Smoker    Smokeless tobacco: Never Used   Substance and Sexual Activity    Alcohol use: No    Drug use: No    Sexual activity: Not on file     I did review patient's past medical and family/social history, no changes noted.    ROS: Complete review of systems was reviewed and all systems reviewed were negative except for the pertinent positives as mentioned in the HPI.    Exam:  Pulse 85   Temp 37.2 C (98.9 F) (Temporal)   Ht 1.676 m (5' 5.98")   Wt 79.8 kg (176 lb)   SpO2 98%   BMI 28.42 kg/m     Appearance: in no apparent distress, alert, oriented times 3, well groomed and dressed and cooperative, mood appropriate, responds appropriately to spoken word with non labored breathing.    HEENT: NC/AT  CV: pulses intact  Resp: unlabored breathing  Skin: no excoriations, lacerations, or rashes  Gait: heel toe pattern, normal foot angle progression.    Msk:   left Knee exam  Upon inspection, there is varus alignment  no swelling or ecchymosis.  ROM: 0 - 110  Patellofemoral crepitus +  Patellar grind test -  Effusion -  Medial joint line tenderness + Lateral joint line tenderness +  Motor in LE: 5/5 EHL/TA/GS/Quad/Hamstring  Normal sensation L2- S1 distribution  DP 2+    Imaging: Radiographs obtained 03/28/20 and radiology report that I independently reviewed demonstrate progression the left knee osteoarthritis, most significant in the medial compartment.   Chondrocalcinosis.    A: 32yr female with left knee osteoarthritis    P: Given the patient's history, physical exam, and radiographs, the patient has findings consistent with osteoarthritis of the knee. Educated the patient in regards to the pathophysiology and progression of arthritis and given a handout.  I also counseled the patient on treatment options to include conservative treatment options such as activity modification, weight management,  icing, antiinflammatory medication, bracing and knee exercises/physical therapy.  The patient also understands that if oral antiinflammatory medication does not offer substantial relief, they may undergo injectible medication to include steroid or viscosupplementation.  The patient also understands that they may elect to undergo a total knee replacement if conservative management fails. The patient was given an orthotics order for a hinged knee brace.  She had good improvement in the right knee with a steroid injection in the past, and would like to proceed with this today. Risks and benefits were discussed and patient elected to proceed.  Patient was in a seated position.  Under sterile preparation, I injected 2 cc of 1% lidocaine, 2cc of 0.2% ropivicaine, and 50ml of 40mg /ml kenalog into the left knee joint through an inferolateral approach. The patient tolerated the procedure well without complication. They should ice the area and monitor for a flare reaction. Call or return to clinic prn if these symptoms worsen or fail to improve as anticipated.    Electronically signed by:  , MD  Orthopaedic Surgery

## 2020-04-03 NOTE — Nursing Note (Signed)
Patient identified by name and date of birth, and address. Patient has been asked if they are taking any OTC or herbal medications, vitamins, supplements or home remedies. Also asked if they have been hospitalized since last visit, patient denies being hospitalized since last office visit. Patient here today for left knee pain. Patient here today with self. Nely Dedmon MA II

## 2020-04-05 ENCOUNTER — Other Ambulatory Visit: Payer: Self-pay | Admitting: *Deleted

## 2020-04-05 ENCOUNTER — Telehealth (HOSPITAL_COMMUNITY): Payer: Self-pay

## 2020-04-05 DIAGNOSIS — I255 Ischemic cardiomyopathy: Secondary | ICD-10-CM | POA: Diagnosis not present

## 2020-04-05 DIAGNOSIS — I25119 Atherosclerotic heart disease of native coronary artery with unspecified angina pectoris: Secondary | ICD-10-CM | POA: Diagnosis not present

## 2020-04-05 DIAGNOSIS — I10 Essential (primary) hypertension: Secondary | ICD-10-CM | POA: Insufficient documentation

## 2020-04-05 DIAGNOSIS — F3341 Major depressive disorder, recurrent, in partial remission: Secondary | ICD-10-CM | POA: Diagnosis not present

## 2020-04-05 DIAGNOSIS — I700 Atherosclerosis of aorta: Secondary | ICD-10-CM | POA: Diagnosis not present

## 2020-04-05 NOTE — Patient Outreach (Signed)
[facility]) Care Management  04/05/2020  FRANCA STAKES [birth year] [id number]  Telephone outreach:  Mrs. Suzanne Galloway is progressing and recovering status post 2 MI's. He also has a new dx of HF.  She went to the [facility] hospital in [facility] and has established with Dr. Ralph Dowdy, cardiologist. She is quite pleased with him and his practice and will continue to follow up with them.  Yorley advised she would be see her primary MD today, Dr. Laurann Montana.  Patient was recently discharged from hospital and all medications have been reviewed. MULTIPLE NEW CARDIAC MEDS! Outpatient Encounter Medications as of 04/05/2020  Medication Sig Note  . aspirin 81 MG chewable tablet Chew 81 mg by mouth daily.   Marland Kitchen atorvastatin (LIPITOR) 80 MG tablet Take 80 mg by mouth daily.   . Calcium Carbonate-Vitamin D (CALCIUM 600+D) 600-400 MG-UNIT tablet Take 1 tablet by mouth daily. 05/29/2019: 500+D 1000  . escitalopram (LEXAPRO) 20 MG tablet Take 20 mg by mouth daily.   . famotidine (PEPCID) 20 MG tablet Take 20 mg by mouth.   . isosorbide mononitrate (IMDUR) 30 MG 24 hr tablet Take 30 mg by mouth daily.   Marland Kitchen levothyroxine (SYNTHROID) 75 MCG tablet Take 75 mcg by mouth every morning. 10/03/19 takes 88 mcg alternating with 75 mcg daily 03/19/2020: 62mcg daily  . metoprolol succinate (TOPROL-XL) 25 MG 24 hr tablet Take 25 mg by mouth daily.   . nitroGLYCERIN (NITROSTAT) 0.4 MG SL tablet Place 0.4 mg under the tongue every 5 (five) minutes as needed for chest pain.   . sacubitril-valsartan (ENTRESTO) 24-26 MG Take 1 tablet by mouth 2 (two) times daily.   Marland Kitchen spironolactone (ALDACTONE) 25 MG tablet Take 25 mg by mouth daily.   . ticagrelor (BRILINTA) 90 MG TABS tablet Take by mouth 2 (two) times daily.   Marland Kitchen levothyroxine (SYNTHROID, LEVOTHROID) 88 MCG tablet Take 88 mcg by mouth daily before breakfast. (Patient not taking: Reported on 04/06/2020)   . Multiple Vitamins-Minerals (PRESERVISION AREDS PO) Take 2  tablets by mouth daily.  03/19/2020: Reduced to 12.5 mg daily  . sucralfate (CARAFATE) 1 g tablet Take 1 g by mouth 4 (four) times daily -  with meals and at bedtime. 02/23/2019: 02/23/19 taking 3 x daily  . [name] 50 MG TABS tablet Take 1 tablet (50 mg total) by mouth 2 (two) times daily.   Marland Kitchen zonisamide (ZONEGRAN) 100 MG capsule Take 2 capsules (200 mg total) by mouth at bedtime.   . [DISCONTINUED] metoprolol succinate (TOPROL-XL) 50 MG 24 hr tablet Take 50 mg by mouth daily. Take with or immediately following a meal. 03/19/2020: Reduced to 12.5 mg daily   No facility-administered encounter medications on file as of 04/05/2020.   Fall Risk  04/06/2020 08/10/2018 04/13/2018 02/01/2018 10/21/2017  Falls in the past year? 0 No No No No  Number falls in past yr: 0 - - - -  Injury with Fall? 0 - - - -   Depression screen [facility] 2/9 04/06/2020 06/11/2017  Decreased Interest 1 1  Down, Depressed, Hopeless 1 1  PHQ - 2 Score 2 2  Altered sleeping 0 0  Tired, decreased energy 1 2  Change in appetite 0 0  Feeling bad or failure about yourself  0 0  Trouble concentrating 1 1  Moving slowly or fidgety/restless 0 0  Suicidal thoughts 0 0  PHQ-9 Score 4 5  Difficult doing work/chores Not difficult at all Not difficult at all   [place]  Problem One     Most Recent Value  Care Plan Problem One New cardiac problems: MI X 2, CHF.  Role Documenting the Problem One Care Management [place] for Problem One Active  [facility] Long Term Goal  Pt will be following basic self care habits to monitor and maintain her health status by the end of 90 days as noted by NP.  THN Long Term Goal Start Date 04/05/20  Interventions for Problem One Long Term Goal Reviewed episode, hospitalizations, new meds, instructions, follow up appts, diet.  THN CM Short Term Goal #1  Pt will follow up with her cardiology team as scheduled over the next 30 days as seen in MR.  THN CM Short Term Goal #1 Start Date 04/05/20   Interventions for Short Term Goal #1 Reinforced the importance of attending follow up appts.     I will call Micaella again in one week.  [name]. Myrtie Neither, MSN, [facility] Gerontological Nurse Practitioner [facility] Care Management [phone]

## 2020-04-05 NOTE — Telephone Encounter (Signed)
Pt insurance is active and benefits verified through [facility]. Co-pay $20.00, DED $0.00/$0.00 met, out of pocket $3,900.00/$457.55 met, co-insurance 0%. No pre-authorization required. [place] Medicare, 04/05/20 @ 3:13PM, REF#[id number]  Will contact patient to see if she is interested in the Cardiac Rehab Program. If interested, patient will need to complete follow up appt. Once completed, patient will be contacted for scheduling upon review by the RN Navigator.

## 2020-04-06 ENCOUNTER — Encounter: Payer: Self-pay | Admitting: *Deleted

## 2020-04-08 ENCOUNTER — Encounter (HOSPITAL_COMMUNITY): Payer: Self-pay | Admitting: *Deleted

## 2020-04-08 NOTE — Progress Notes (Signed)
Received referral notification from Dr. Ralph Dowdy at [facility] for this pt to participate in Cardiac Rehab with the diagnosis of 02/2020 Stemi, DES to LAD vfib arrest. Pt had subsequent admission for atypical chest pain on 6/2.  Treated with increased BB and the addition of imdur.Clinical review of pt follow up appt on 6/14  with Cathlean Sauer NP with Dr. Ralph Dowdy - cardiologist office note. Pt is making the expected progress in recovery.  Pt appropriate for scheduling for on site cardiac rehab and/or enrollment in Virtual Cardiac Rehab once signed MD referral order received.  Pt Covid score is 3.  Will forward to staff for follow up. Cherre Huger, BSN Cardiac and Training and development officer

## 2020-04-15 ENCOUNTER — Ambulatory Visit: Payer: Medicare Other | Attending: Internal Medicine

## 2020-04-15 DIAGNOSIS — E7800 Pure hypercholesterolemia, unspecified: Secondary | ICD-10-CM

## 2020-04-15 DIAGNOSIS — E039 Hypothyroidism, unspecified: Secondary | ICD-10-CM | POA: Insufficient documentation

## 2020-04-15 LAB — COMPREHENSIVE METABOLIC PANEL
Alanine Transferase (ALT): 18 U/L (ref 5–54)
Albumin: 3.5 g/dL (ref 3.2–4.6)
Alkaline Phosphatase (ALP): 59 U/L (ref 35–115)
Aspartate Transaminase (AST): 19 U/L (ref 15–43)
Bilirubin Total: 0.8 mg/dL (ref 0.3–1.3)
Calcium: 8.7 mg/dL (ref 8.6–10.5)
Carbon Dioxide Total: 25 mmol/L (ref 24–32)
Chloride: 106 mmol/L (ref 95–110)
Creatinine Serum: 0.83 mg/dL (ref 0.44–1.27)
E-GFR Creatinine (Female): 68 mL/min/{1.73_m2}
Glucose: 93 mg/dL (ref 70–99)
Potassium: 4.1 mmol/L (ref 3.3–5.0)
Protein: 5.9 g/dL — ABNORMAL LOW (ref 6.3–8.3)
Sodium: 140 mmol/L (ref 135–145)
Urea Nitrogen, Blood (BUN): 17 mg/dL (ref 8–22)

## 2020-04-15 LAB — CBC WITH DIFFERENTIAL
Basophils % Auto: 1.1 %
Basophils Abs Auto: 0.1 10*3/uL (ref 0.0–0.2)
Eosinophils % Auto: 3.4 %
Eosinophils Abs Auto: 0.2 10*3/uL (ref 0.0–0.5)
Hematocrit: 41.7 % (ref 36.0–46.0)
Hemoglobin: 13.8 g/dL (ref 12.0–16.0)
Lymphocytes % Auto: 29.6 %
Lymphocytes Abs Auto: 2 10*3/uL (ref 1.0–4.8)
MCH: 30.3 pg (ref 27.0–33.0)
MCHC: 33.1 % (ref 32.0–36.0)
MCV: 91.5 fL (ref 80.0–100.0)
MPV: 8.4 fL (ref 6.8–10.0)
Monocytes % Auto: 8.1 %
Monocytes Abs Auto: 0.6 10*3/uL (ref 0.1–0.8)
Neutrophils % Auto: 57.8 %
Neutrophils Abs Auto: 4 10*3/uL (ref 1.8–7.7)
Platelet Count: 313 10*3/uL (ref 130–400)
RDW: 14.5 % (ref 0.0–14.7)
Red Blood Cell Count: 4.55 10*6/uL (ref 4.00–5.20)
White Blood Cell Count: 6.9 10*3/uL (ref 4.5–11.0)

## 2020-04-15 LAB — LIPID PANEL
Cholesterol: 181 mg/dL (ref 0–200)
HDL Cholesterol: 60 mg/dL (ref >=35)
LDL Cholesterol Calculation: 105 mg/dL (ref <130)
Non-HDL Cholesterol: 121 mg/dL (ref <=150)
Total Cholesterol: HDL Ratio: 3 (ref <4.0)
Triglyceride Level: 78 mg/dL (ref 35–160)

## 2020-04-15 LAB — THYROID STIMULATING HORMONE: Thyroid Stimulating Hormone: 1.53 u[IU]/mL (ref 0.35–3.30)

## 2020-04-16 ENCOUNTER — Telehealth (HOSPITAL_COMMUNITY): Payer: Self-pay

## 2020-04-16 NOTE — Telephone Encounter (Signed)
Pt called and stated that she has been waiting on a call back to schedule for cardiac rehab. Advised pt that we are waiting for a signed MD order from her [facility] cardiologist. Advised pt that we sent the order over to be signed on 04/05/2020 and we received the order back signed incorrectly. Advised pt that we re faxed the MD order over on 04/08/2020 to receive a handwritten signature. Called [facility] cardiology and advised them of this information and they stated that they will have the handwritten signature MD order faxed back

## 2020-04-16 NOTE — Telephone Encounter (Signed)
Tempi from [facility] returned phone call, adv her we recv'd MD order but it was stamped with a rubber stamp signature. And per [facility] health we are not able to accept. Also adv her we sent a 2nd request over on the 21st of June for a new MD order to be completed and a letter why. She stated she did not recv'd the 2nd request and that the MD was out of the office and was not sure when he will return, and not sure if another MD in office will be able to sign the order.  Will refax MD order and letter.

## 2020-04-24 ENCOUNTER — Encounter: Payer: Self-pay | Admitting: Internal Medicine

## 2020-04-24 ENCOUNTER — Ambulatory Visit: Payer: Medicare Other | Admitting: Internal Medicine

## 2020-04-24 ENCOUNTER — Other Ambulatory Visit: Payer: Self-pay | Admitting: Internal Medicine

## 2020-04-24 VITALS — BP 118/70 | HR 83 | Temp 97.7°F | Resp 16 | Ht 66.0 in | Wt 174.7 lb

## 2020-04-24 DIAGNOSIS — E039 Hypothyroidism, unspecified: Secondary | ICD-10-CM

## 2020-04-24 DIAGNOSIS — I480 Paroxysmal atrial fibrillation: Secondary | ICD-10-CM

## 2020-04-24 DIAGNOSIS — E7800 Pure hypercholesterolemia, unspecified: Secondary | ICD-10-CM

## 2020-04-24 DIAGNOSIS — E785 Hyperlipidemia, unspecified: Secondary | ICD-10-CM

## 2020-04-24 DIAGNOSIS — I6529 Occlusion and stenosis of unspecified carotid artery: Secondary | ICD-10-CM

## 2020-04-24 MED ORDER — LEVOTHYROXINE 112 MCG TABLET
ORAL_TABLET | ORAL | 2 refills | Status: DC
Start: 2020-04-24 — End: 2020-04-24

## 2020-04-24 MED ORDER — CLOPIDOGREL 75 MG TABLET
75.0000 mg | ORAL_TABLET | Freq: Every day | ORAL | 5 refills | Status: DC
Start: 2020-04-24 — End: 2020-04-24

## 2020-04-24 MED ORDER — CLOPIDOGREL 75 MG TABLET
ORAL_TABLET | ORAL | 2 refills | Status: DC
Start: 2020-04-24 — End: 2020-04-25

## 2020-04-24 NOTE — Telephone Encounter (Signed)
Requested Prescriptions     Pending Prescriptions Disp Refills    LevoTHYROxine 112 mcg Tablet 90 tablet 2     Sig: One pill daily (thyroid)    Atorvastatin (LIPITOR) 40 mg tablet 90 tablet 3     Sig: Take 1 tablet by mouth every day.

## 2020-04-24 NOTE — Patient Instructions (Signed)
   CHANGE ASPRIN TO PLAVIX 75 MG DAILY

## 2020-04-24 NOTE — Progress Notes (Signed)
Chief Complaint: Patient presents today with   Follow-up on the blood pressure and cholesterol    Patient WAS wearing a surgical mask   Contact and Droplet precautions were followed when caring for the patient.   PPE used by provider during encounter: Surgical mask,face shield and gloves used     Tammy [name] If you are reviewing this progress note and have questions about the meaning or medical terms being used, please schedule an appointment or bring it up at your next follow-up appointment. Medical notes are meant to be a communication tool between medical professionals and require medical terms to be used for efficiency.      - Voice recognition software may have been used in portions of this report.   Inadvertent computerized transcription errors may be present despite efforts.          Hyperlipidemia,    management discussed with patient, shehas  been working on  diet, she has  been doing regular exercise. Patient verbalised understanding about the significance for regular exercise and low fat diet in management of this problem    Lab Results   Lab Name Value Date/Time    CHOL 181 04/15/2020 07:31 AM    CHOL 188 05/09/2014 09:44 AM    LDLC 105 04/15/2020 07:31 AM    LDLC 107 05/09/2014 09:44 AM    HDL 60 04/15/2020 07:31 AM    HDL 59 05/09/2014 09:44 AM    TRIG 78 04/15/2020 07:31 AM    TRIG 108 05/09/2014 09:44 AM     No symptoms of hypo or hyperthyroidism: no decreased or increased weight, no feeling cold/chilly or excessively warm, no diarrhea or constipation, no undue sweatiness, anxiety or palpitations.   Lab Results   Lab Name Value Date/Time    TSH 1.53 04/15/2020 07:31 AM    TSH 1.89 05/09/2014 09:44 AM    FT4 1.27 06/17/2009 08:16 AM          She was being evaluated for the dentist for broken teeth and infection going to the sinuses for which she received Amoxicillin  also had dental x-rays  She brings her dentist letter from Dr. Golden Circle -she had x-ray dental films on the CBCT taken that there is a  radiopacity present along the inferior border of the right hyoid bone near the base of her scan.  This radiopacity is potentially near the artery branches of the area, and previous experiences with this types of lesion often turn out to be atheromatous or plaques in the arteries.  We have also discussed with Tammy [name] that this could be a calcification of the hyoid itself or the surrounding cartilage.  At this time we recommend that Tammy [name] be evaluated by her primary care physician/or potentially a cardiologist.  We do have the ability to send the CBCT to be milliliters for additional radiographic graphic evaluation if needed.  Please evaluate and treat as you see fit and please free to contact us with any questions or concerns      Ros arthrosclerosis plaques  complete review of symptons was done all other systems are negative except as otherwise stated in HPI      Vs  BP 118/70 (SITE: right arm, Orthostatic Position: sitting, Cuff Size: regular)   Pulse 83   Temp 36.5 C (97.7 F) (Temporal)   Resp 16   Ht 1.676 m (5\' 6" )   Wt 79.2 kg (174 lb 11.2 oz)   SpO2 99%   BMI 28.20 kg/m  Physical exam -  General Appearance: healthy, alert, no distress, pleasant affect, cooperative.  Eyes:  conjunctivae and corneas clear. PERRL, EOM's intact. sclerae normal.  Ears:  external inspection of ears show no abnormality.  Carotid no bruit with with  Lungs: no chest deformities noted .lungs clear to auscultation.  Heart:  normal rate and regular rhythm, no murmurs,  Extremities:  no cyanosis, clubbing, or edema.    ASSESMENT AND PLAN     (E03.9) Acquired hypothyroidism  (primary encounter diagnosis)  Comment:  Plan: Thyroid Stimulating Hormone            (E78.00) Pure hypercholesterolemia  Comment: Cholesterol is well controlled  Plan: Continue atorvastatin 40 mg a day    (I48.0) Paroxysmal atrial fibrillation (HCC)  Comment: History of  Plan: Currently not on any anticoagulation    (I65.29) Carotid atherosclerosis,  unspecified laterality  Comment: ?  To be further evaluated  Plan: US CAROTID DUPLEX, BILATERAL, CBC with         Differential, Comprehensive Metabolic Panel,         Lipid Panel, Thyroid Stimulating Hormone  Will change aspirin 81 mg to Plavix 75 mg a day        I did review patient's past medical and family/social history, no changes noted.  Barriers to Learning assessed: none. Patient verbalizes understanding of teaching and instructions.  Zettie Cooley MD      Electronically Signed By:  Zettie Cooley, MD  Physician associate   [facility] [place]  [phone]

## 2020-04-24 NOTE — Nursing Note (Signed)
Vital signs taken, allergies verified, screened for pain, pharmacy verified, and verified immunization status. The patient has indicated no to receiving consults or services outside the [facility] system.  If yes, we have requested information to ascertain these results. Patient is here with self  Tammy Janes, MA

## 2020-04-25 ENCOUNTER — Telehealth: Payer: Self-pay | Admitting: Internal Medicine

## 2020-04-25 MED ORDER — ATORVASTATIN 40 MG TABLET
40.0000 mg | ORAL_TABLET | Freq: Every day | ORAL | 3 refills | Status: DC
Start: 2020-04-25 — End: 2020-07-30

## 2020-04-25 MED ORDER — CLOPIDOGREL 75 MG TABLET
75.0000 mg | ORAL_TABLET | Freq: Every day | ORAL | 3 refills | Status: DC
Start: 2020-04-25 — End: 2020-07-30

## 2020-04-25 MED ORDER — LEVOTHYROXINE 112 MCG TABLET
ORAL_TABLET | ORAL | 2 refills | Status: DC
Start: 2020-04-25 — End: 2021-04-23

## 2020-04-25 NOTE — Telephone Encounter (Signed)
Fax received from [facility] regarding rx for clopidogrel bisulfate:  Clopidogrel (PLAVIX) 75 mg Tablet 90 tablet 2 04/24/2020 04/24/2021    Sig: (blood thinner)    Sent to pharmacy as: clopidogreL 75 mg tablet (PLAVIX)    Class: Pharmacy    E-Prescribing Status: Receipt confirmed by pharmacy (04/24/2020 9:11 AM PDT)      Please review sig and resend.

## 2020-04-26 NOTE — Telephone Encounter (Signed)
Medication sent.

## 2020-04-29 ENCOUNTER — Telehealth: Payer: Self-pay | Admitting: Diagnostic Neuroimaging

## 2020-04-29 NOTE — Telephone Encounter (Signed)
Called patient who stated on 04/01/20 she got vimpat for $37 for one month. Today she got same # tabs at $142.87. she stated pharmacist told her she in gap with insurance. We discussed that she may have to meet deductible or reach gap before price lowers. She stated if she has to pay higher for a few months she'll do it. I advised will call pharmacy for details. Patient verbalized understanding, appreciation. [employer], spoke with Wallace who stated her insurance has reached a coverage gap. She'll have to pay out of pocket until gap met. Then Medicare will cover at much lower cost. She doesn't qualify for discount card with Medicare.  Desiree stated she'll call insurance to find out gap amount and let patient know.

## 2020-04-29 NOTE — Telephone Encounter (Signed)
Pt called stating that the price went up on her [name] 50 MG TABS tablet and she is wanting to know if there is a generic for this medication. Please advise.

## 2020-04-30 ENCOUNTER — Telehealth (HOSPITAL_COMMUNITY): Payer: Self-pay

## 2020-04-30 ENCOUNTER — Encounter (HOSPITAL_COMMUNITY): Payer: Self-pay

## 2020-04-30 NOTE — Telephone Encounter (Signed)
Noted. -VRP 

## 2020-04-30 NOTE — Telephone Encounter (Signed)
Attempted to call patient in regards to Cardiac Rehab - LM on VM Mailed letter 

## 2020-05-16 ENCOUNTER — Telehealth (HOSPITAL_COMMUNITY): Payer: Self-pay | Admitting: Pharmacy Technician

## 2020-05-16 NOTE — Telephone Encounter (Signed)
Cardiac Rehab Medication Review by a Pharmacist  Does the patient  feel that his/her medications are working for him/her?  yes  Has the patient been experiencing any side effects to the medications prescribed?  No Patient does endorse severe diarrhea (at least 2 BM per day with one night of 8 watery stools) however patient has a doctor's appointment this week to have this checked out. Patient denies symptoms of lightheadedness or dizziness and is staying hydrated.   Does the patient measure his/her own blood pressure or blood glucose at home?  yes - BP only BP 122/56, HR 69 - only dizziness endorsed when standing up too quickly  Does the patient have any problems obtaining medications due to transportation or finances?   no  Understanding of regimen: good Understanding of indications: good Potential of compliance: good  Pharmacist Intervention: N/A  Romilda Garret, PharmD PGY1 Acute Care Pharmacy Resident Phone: [phone] 05/16/2020 4:12 PM  Please check [email or web address] for unit specific pharmacy phone numbers.

## 2020-05-20 NOTE — Progress Notes (Incomplete)
Name: Tammy [name]  Date: ***  Sex: female   Age: 63yr    MR#: [id number]   Ref Dr: Matilde Haymaker, MD        HPI Tammy [name] ***, seen in consult by the request of Dr. Marland Kitchen for evaluation of ***.   patient mask status:20356   airborne/isolation:[id number] precautions were followed when caring for the patient.    PPE used by provider during encounter:20355     Is there any history of heart problems?   Previous bypass surgery, stent, MI or heart failure?   CVFC *** Treadmill?      Chronic illnesses update   Vascular calcifications: She was being evaluated for the dentist for broken teeth and infection going to the sinuses for which she received Amoxicillin  also had dental x-rays  She brings her dentist letter from Dr. Golden Circle -she had x-ray dental films on the CBCT taken that there is a radiopacity present along the inferior border of the right hyoid bone near the base of her scan.  This radiopacity is potentially near the artery branches of the area, and previous experiences with this types of lesion often turn out to be atheromatous or plaques in the arteries.  We have also discussed with Tammy [name] that this could be a calcification of the hyoid itself or the surrounding cartilage. At this time we recommend that Tammy [name] be evaluated by her primary care physician/or potentially a cardiologist.  H/O Atrial fibrillation: On April 04, 2009, patient was lifting some heavy boxes and experienced right-sided chest pain with shortness of breath.  When she pressed on it, it felt better.  Patient thought it was musculoskeletal in component.  On April 05, 2009, patient noted increase in chest pain symptoms and shortness of breath.  Patient traveled to [facility] and hospitalized at [facility] after evaluation showed patient to be in atrial fibrillation with rapid ventricular response and CT of the chest showed pulmonary embolism the medial base of the right lower lobe segment, pulmonary artery, right lower lobe atelectasis,  and old infiltrate and right pleural effusion.  Patient was converted to sinus rhythm with intravenous Cardizem and patient improved.  Patient's assessment had [facility] in [place], [place], by the admitting physician was that the pulmonary emboli most likely was secondary to possible recent flight versus underlying genetic disorder, underlying malignancy.  Patient was started on subcu Lovenox and warfarin.   Hyperlipidemia  Hypothyroidism     CHART REVIEW AND UPDATE ***  Current cardiac medications:   Atorvastatin 40 mg/day  Clopidogrel 75 mgday  /  Previous cardiac medications:   ***  /  Medication compliance: ***  Anticoagulants: ***  Stimulants  Coffee ***; Tea ***; Cola ***; Other ***    Smoking status: never smoker     Risk Factor for AF      Diabetes  No    HTN  No     Tobacco use Never smoker     High BMI ***    OSA ***    ETOH  ***    LVH  ***    Family History  ***    RV pacing  ***    .  Risk Factor for CAD      DM  ***   HTN  ***      Tobacco  ***    High BMI ***    Hyperlipidemia  ***    Family Hx of premature CAD  ***  PAD  ***    Sedentary?  ***    .  .    Patient Active Problem List   Diagnosis    Hypothyroidism    Routine general medical examination at a health care facility    Other malaise and fatigue    left supraclavicular fullness    Loss of weight    Pure hypercholesterolemia    Pulmonary embolism (HCC)    Atrial fibrillation (HCC)    Long term current use of anticoagulant therapy    PE (physical exam)    BMI 28.0-28.9,adult    Vitamin D deficiency       Allergies:   Allergies   Allergen Reactions    Enoxaparin Rash     When she had PE, diffuse rash, biopsied           Medications:   Current Outpatient Medications   Medication Sig    Atorvastatin (LIPITOR) 40 mg tablet Take 1 tablet by mouth every day.    Clopidogrel (PLAVIX) 75 mg Tablet Take 1 tablet by mouth every morning. (blood thinner)    LevoTHYROxine 112 mcg Tablet One pill daily (thyroid)     No current  facility-administered medications for this visit.       Social History:   Social History     Socioeconomic History    Marital status: MARRIED     Spouse name: Not on file    Number of children: 2    Years of education: Not on file    Highest education level: Not on file   Occupational History    Occupation: used to work with animals   Tobacco Use    Smoking status: Never Smoker    Smokeless tobacco: Never Used   Substance and Sexual Activity    Alcohol use: No    Drug use: No    Sexual activity: Not on file   Other Topics Concern    Not on file   Social History Narrative    Not on file     Social Determinants of Health     Financial Resource Strain:     Difficulty of Paying Living Expenses:    Food Insecurity:     Worried About Programme researcher, broadcasting/film/video in the Last Year:     Barista in the Last Year:    Transportation Needs:     Freight forwarder (Medical):     Lack of Transportation (Non-Medical):    Physical Activity:     Days of Exercise per Week:     Minutes of Exercise per Session:    Stress:     Feeling of Stress :    Social Connections:     Frequency of Communication with Friends and Family:     Frequency of Social Gatherings with Friends and Family:     Attends Religious Services:     Active Member of Clubs or Organizations:     Attends Engineer, structural:     Marital Status:    Intimate Partner Violence:     Fear of Current or Ex-Partner:     Emotionally Abused:     Physically Abused:     Sexually Abused:      Past Medical History:   Past Medical History:   Diagnosis Date    Herpes zoster without mention of complication 2004       Past Surgical History:   Past Surgical History:   Procedure Laterality Date    COLONOSCOPY  declined in past    NO SURGICAL HISTORY        Family History:   Family History   Problem Relation Name Age of Onset    Cancer Sister          oral    Heart Sister      Heart Mother         ROS:   Constitutional: weight ***.  Eyes:  *** glasses.  Ears, Nose, Mouth, Throat: No sore throat, rhinorrhea, dysphagia..  CV: ***  Resp: No hemoptysis, valley fever.  GI: negative  history of GI bleeding, nausea, vomit, diarrhea.  GU: No history of renal colic, hematuria, nocturia.  Musculoskeletal: No back pain or joint pain  Integumentary: negative history of pruritus, photosensitivity.  Neuro: No history of seizures, focal neurological deficit, cognitive disorder.Marland Kitchen.  Psych: Mood pt's report, euthymic.  Endo: No history of DM, thyroid disorders.  Heme/Lymphatic: No history of blood dyscrasia, bruising or bleeding.  Allergy/Immun: Enoxiheparin.      There were no vitals filed for this visit.    Physical Exam:  General Appearance:  General Appearance:10008::"healthy, alert, no distress, pleasant affect, cooperative".  Eyes:   Brief Eye Exam:10444::"conjunctivae and corneas clear. PERRL, EOM's intact. sclerae normal".  Ears:   Ear Exam Brief:10009::"normal TMs and canal","external inspection of ears show no abnormality".  Nose:   Nose Brief Exam:10011::"normal".  Mouth:  Oropharynx Brief Exam:10012::"normal".  Neck:   Neck Exam:10490::"Neck supple. No adenopathy, thyroid symmetric, normal size".  Heart:   Heart Exam:10535::"normal rate and regular rhythm, no murmurs, clicks, or gallops".  Lungs:  Lung Exam Brief:10013::"clear to auscultation".  Extremities:   Extremity Exam:10533::"no cyanosis, clubbing, or edema".  Skin:   SKIN EXAM:10543::"Skin color, texture, turgor normal. No rashes or lesions".  Mental Status:  mental status:11091    DATA REVIEWED  Is the lab up to date? ***  Labs:     BCP:   Lab Results   Lab Name Value Date/Time    NA 140 04/15/2020 07:31 AM    NA 139 05/09/2014 09:44 AM    K 4.1 04/15/2020 07:31 AM    K 3.9 05/09/2014 09:44 AM    CL 106 04/15/2020 07:31 AM    CL 106 05/09/2014 09:44 AM    CO2 25 04/15/2020 07:31 AM    CO2 27 05/09/2014 09:44 AM    BUN 17 04/15/2020 07:31 AM    BUN 15 05/09/2014 09:44 AM    CR 0.83  04/15/2020 07:31 AM    CR 0.69 05/09/2014 09:44 AM    GLU 93 04/15/2020 07:31 AM    GLU 96 05/09/2014 09:44 AM     LFT:   Lab Results   Lab Name Value Date/Time    AST 19 04/15/2020 07:31 AM    AST 25 05/09/2014 09:44 AM    ALT 18 04/15/2020 07:31 AM    ALT 18 05/09/2014 09:44 AM    ALP 59 04/15/2020 07:31 AM    ALP 44 05/09/2014 09:44 AM    ALB 3.5 04/15/2020 07:31 AM    ALB 3.7 05/09/2014 09:44 AM    TP 5.9 (L) 04/15/2020 07:31 AM    TP 6.5 05/09/2014 09:44 AM    TBIL 0.8 04/15/2020 07:31 AM    TBIL 0.7 05/09/2014 09:44 AM     CBC:   Lab Results   Lab Name Value Date/Time    WBC 6.9 04/15/2020 07:31 AM    WBC 7.3 05/09/2014 09:44 AM    HGB 13.8 04/15/2020  07:31 AM    HGB 13.8 05/09/2014 09:44 AM    HCT 41.7 04/15/2020 07:31 AM    HCT 42.1 05/09/2014 09:44 AM    PLT 313 04/15/2020 07:31 AM    PLT 302 05/09/2014 09:44 AM       Lipids:   Lab Results   Lab Name Value Date/Time    CHOL 181 04/15/2020 07:31 AM    CHOL 188 05/09/2014 09:44 AM    LDLC 105 04/15/2020 07:31 AM    LDLC 107 05/09/2014 09:44 AM    HDL 60 04/15/2020 07:31 AM    HDL 59 05/09/2014 09:44 AM    TRIG 78 04/15/2020 07:31 AM    TRIG 108 05/09/2014 09:44 AM     Thyroid: @LABTSH @      EKG     ***:    *** (tracing personally and independently reviewed)  Rhythm Strip: ***    MEDICAL-DECISION-MANAGEMENT:  Is the lab up to date? ***  ***     RTO for follow up and re evaluation ***    Instructions and list of medications printed and provided to the patient.     history review:10509   education:10548::"Barriers to Learning assessed: none. Patient verbalizes understanding of teaching and instructions."  , MD  .

## 2020-05-21 ENCOUNTER — Telehealth (HOSPITAL_COMMUNITY): Payer: Self-pay | Admitting: *Deleted

## 2020-05-21 DIAGNOSIS — H903 Sensorineural hearing loss, bilateral: Secondary | ICD-10-CM | POA: Diagnosis not present

## 2020-05-21 DIAGNOSIS — K580 Irritable bowel syndrome with diarrhea: Secondary | ICD-10-CM | POA: Diagnosis not present

## 2020-05-21 DIAGNOSIS — R197 Diarrhea, unspecified: Secondary | ICD-10-CM | POA: Diagnosis not present

## 2020-05-21 NOTE — Telephone Encounter (Signed)
Spoke with patient confirmed appointment for orientation on 05/23/20 at 1:30. Patient says she is not having loose stools currently. Unable to complete health history as the patient is currently at a doctor's appointment.Barnet Pall, RN,BSN 05/21/2020 11:09 AM

## 2020-05-21 NOTE — Telephone Encounter (Signed)
Left message to call cardiac rehab regarding cardiac rehab orientation appointment.Barnet Pall, RN,BSN 05/21/2020 10:52 AM

## 2020-05-22 ENCOUNTER — Other Ambulatory Visit: Payer: Self-pay | Admitting: Internal Medicine

## 2020-05-22 DIAGNOSIS — Z1231 Encounter for screening mammogram for malignant neoplasm of breast: Secondary | ICD-10-CM

## 2020-05-23 ENCOUNTER — Encounter (HOSPITAL_COMMUNITY)
Admission: RE | Admit: 2020-05-23 | Discharge: 2020-05-23 | Disposition: A | Discharge status: Home / Self Care | Payer: Medicare Other | Source: Ambulatory Visit | Attending: Cardiology | Admitting: Cardiology

## 2020-05-23 ENCOUNTER — Other Ambulatory Visit: Payer: Self-pay

## 2020-05-23 ENCOUNTER — Encounter (HOSPITAL_COMMUNITY): Payer: Self-pay

## 2020-05-23 VITALS — BP 118/62 | HR 67 | Ht 62.5 in | Wt 129.9 lb

## 2020-05-23 DIAGNOSIS — Z955 Presence of coronary angioplasty implant and graft: Secondary | ICD-10-CM

## 2020-05-23 DIAGNOSIS — I2102 ST elevation (STEMI) myocardial infarction involving left anterior descending coronary artery: Secondary | ICD-10-CM | POA: Insufficient documentation

## 2020-05-23 HISTORY — DX: Atherosclerotic heart disease of native coronary artery without angina pectoris: I25.10

## 2020-05-23 NOTE — Progress Notes (Signed)
Cardiac Individual Treatment Plan  Patient Details  Name: Suzanne Galloway MRN: [id number] Date of Birth: [birth year] Referring Provider:     CARDIAC REHAB PHASE II ORIENTATION from 05/23/2020 in [place]  Referring Provider Dr Herbie [place] Preli-Dr Fransico Him MD, Covering      Initial Encounter Date:    CARDIAC REHAB PHASE II ORIENTATION from 05/23/2020 in [place]  Date 05/23/20      Visit Diagnosis: 03/10/20 STEMI, DES LAD   ST elevation myocardial infarction involving left anterior descending (LAD) coronary artery ([place])  Patient's Home Medications on Admission:  Current Outpatient Medications:  .  Ascorbic Acid (VITAMIN C PO), Take 1 tablet by mouth daily., Disp: , Rfl:  .  aspirin 81 MG chewable tablet, Chew 81 mg by mouth in the morning and at bedtime. , Disp: , Rfl:  .  atorvastatin (LIPITOR) 80 MG tablet, Take 80 mg by mouth daily., Disp: , Rfl:  .  Calcium Carbonate-Vitamin D (CALCIUM 600+D) 600-400 MG-UNIT tablet, Take 1 tablet by mouth daily., Disp: , Rfl:  .  escitalopram (LEXAPRO) 20 MG tablet, Take 20 mg by mouth daily., Disp: , Rfl: 11 .  famotidine (PEPCID) 40 MG tablet, Take 40 mg by mouth 2 (two) times daily. , Disp: , Rfl:  .  isosorbide mononitrate (IMDUR) 30 MG 24 hr tablet, Take 30 mg by mouth daily., Disp: , Rfl:  .  levothyroxine (SYNTHROID) 75 MCG tablet, Take 75 mcg by mouth every morning. , Disp: , Rfl:  .  loperamide (IMODIUM A-D) 2 MG tablet, Take 2 mg by mouth 4 (four) times daily as needed for diarrhea or loose stools., Disp: , Rfl:  .  metoprolol succinate (TOPROL-XL) 25 MG 24 hr tablet, Take 25 mg by mouth daily., Disp: , Rfl:  .  Multiple Vitamins-Minerals (PRESERVISION AREDS PO), Take 1 tablet by mouth daily. , Disp: , Rfl:  .  nitroGLYCERIN (NITROSTAT) 0.4 MG SL tablet, Place 0.4 mg under the tongue every 5 (five) minutes as needed for chest pain., Disp: , Rfl:  .  Polyethyl Glycol-Propyl  Glycol (SYSTANE OP), Apply 1 drop to eye daily as needed., Disp: , Rfl:  .  sacubitril-valsartan (ENTRESTO) 24-26 MG, Take 1 tablet by mouth 2 (two) times daily. , Disp: , Rfl:  .  spironolactone (ALDACTONE) 25 MG tablet, Take 25 mg by mouth daily., Disp: , Rfl:  .  sucralfate (CARAFATE) 1 g tablet, Take 1 g by mouth 4 (four) times daily -  with meals and at bedtime., Disp: , Rfl:  .  ticagrelor (BRILINTA) 90 MG TABS tablet, Take 90 mg by mouth 2 (two) times daily. , Disp: , Rfl:  .  [name] 50 MG TABS tablet, Take 1 tablet (50 mg total) by mouth 2 (two) times daily. (Patient taking differently: Take 50 mg by mouth 2 (two) times daily. ), Disp: 180 tablet, Rfl: 4 .  zonisamide (ZONEGRAN) 100 MG capsule, Take 2 capsules (200 mg total) by mouth at bedtime., Disp: 180 capsule, Rfl: 4  Past Medical History: Past Medical History:  Diagnosis Date  . Anxiety   . Arthritis    "hands, back, toes" (01/01/2015)  . Asthma   . Chronic bronchitis ([place])    "get it just about q yr" (01/01/2015)  . Coronary artery disease    S/P STEMI 03/10/20, DES LAD, VFib Arrest  . Depression   . GERD (gastroesophageal reflux disease)   . Hearing loss  hearing aids  . High cholesterol   . Hyperlipemia   . Hypertension   . Hypothyroidism   . IBS (irritable bowel syndrome)   . Mitral valve prolapse   . Seizures [facility] )    dx Aug 2018, most recent seizure 10/13/17, partial complex  . Urinary incontinence     Tobacco Use: Social History   Tobacco Use  Smoking Status Never Smoker  Smokeless Tobacco Never Used    Labs: Recent Review Flowsheet Data    Labs for ITP Cardiac and Pulmonary Rehab Latest Ref Rng & Units 12/27/2014 01/02/2015 01/03/2015   Cholestrol 0 - 200 mg/dL 182 174 -   LDLCALC 0 - 99 mg/dL 96 105(H) -   HDL >39 mg/dL 38(L) 37(L) -   Trlycerides <150 mg/dL 241(H) 158(H) -   Hemoglobin A1c 4.8 - 5.6 % - - 6.3(H)      Capillary Blood Glucose: Lab Results  Component Value Date   GLUCAP 106 (H)  02/20/2017   GLUCAP 101 (H) 02/20/2017   GLUCAP 106 (H) 02/19/2017   GLUCAP 112 (H) 02/19/2017   GLUCAP 171 (H) 01/06/2015     Exercise Target Goals: Exercise Program Goal: Individual exercise prescription set using results from initial 6 min walk test and THRR while considering  patient's activity barriers and safety.   Exercise Prescription Goal: Starting with aerobic activity 30 plus minutes a day, 3 days per week for initial exercise prescription. Provide home exercise prescription and guidelines that participant acknowledges understanding prior to discharge.  Activity Barriers & Risk Stratification:  Activity Barriers & Cardiac Risk Stratification - 05/23/20 1522      Activity Barriers & Cardiac Risk Stratification   Activity Barriers Back Problems    Cardiac Risk Stratification High           6 Minute Walk:  6 Minute Walk    Row Name 05/23/20 1521         6 Minute Walk   Phase Initial     Distance 1520 feet     Walk Time 6 minutes     # of Rest Breaks 0     MPH 2.9     METS 3.1     RPE 12     Perceived Dyspnea  0     VO2 Peak 10.7     Symptoms No     Resting HR 67 bpm     Resting BP 118/62     Resting Oxygen Saturation  99 %     Exercise Oxygen Saturation  during 6 min walk 98 %     Max Ex. HR 102 bpm     Max Ex. BP 140/60     2 Minute Post BP 120/70            Oxygen Initial Assessment:   Oxygen Re-Evaluation:   Oxygen Discharge (Final Oxygen Re-Evaluation):   Initial Exercise Prescription:  Initial Exercise Prescription - 05/23/20 1500      Date of Initial Exercise RX and Referring Provider   Date 05/23/20    Referring Provider Dr Herbie Baltimore Preli-Dr Fransico Him MD, Covering    Expected Discharge Date 07/19/20      Bike   Level 1.5    Watts 20    Minutes 15    METs 2.7      NuStep   Level 2    SPM 75    Minutes 15    METs 2.5      Prescription Details   Frequency (times  per week) 3x    Duration Progress to 10 minutes  continuous walking  at current work load and total walking time to 30-45 min      Intensity   THRR 40-80% of Max Heartrate 57-114    Ratings of Perceived Exertion 11-13    Perceived Dyspnea 0-4      Progression   Progression Continue progressive overload as per policy without signs/symptoms or physical distress.      Resistance Training   Training Prescription Yes    Weight 3lbs    Reps 10-15           Perform Capillary Blood Glucose checks as needed.  Exercise Prescription Changes:   Exercise Comments:   Exercise Goals and Review:   Exercise Goals    Row Name 05/23/20 1522             Exercise Goals   Increase Physical Activity Yes       Intervention Provide advice, education, support and counseling about physical activity/exercise needs.;Develop an individualized exercise prescription for aerobic and resistive training based on initial evaluation findings, risk stratification, comorbidities and participant's personal goals.       Expected Outcomes Short Term: Attend rehab on a regular basis to increase amount of physical activity.;Long Term: Add in home exercise to make exercise part of routine and to increase amount of physical activity.;Long Term: Exercising regularly at least 3-5 days a week.       Increase Strength and Stamina Yes       Intervention Provide advice, education, support and counseling about physical activity/exercise needs.;Develop an individualized exercise prescription for aerobic and resistive training based on initial evaluation findings, risk stratification, comorbidities and participant's personal goals.       Expected Outcomes Short Term: Increase workloads from initial exercise prescription for resistance, speed, and METs.;Short Term: Perform resistance training exercises routinely during rehab and add in resistance training at home;Long Term: Improve cardiorespiratory fitness, muscular endurance and strength as measured by increased METs and  functional capacity (6MWT)       Able to understand and use rate of perceived exertion (RPE) scale Yes       Intervention Provide education and explanation on how to use RPE scale       Expected Outcomes Short Term: Able to use RPE daily in rehab to express subjective intensity level;Long Term:  Able to use RPE to guide intensity level when exercising independently       Knowledge and understanding of Target Heart Rate Range (THRR) Yes       Intervention Provide education and explanation of THRR including how the numbers were predicted and where they are located for reference       Expected Outcomes Short Term: Able to state/look up THRR;Long Term: Able to use THRR to govern intensity when exercising independently;Short Term: Able to use daily as guideline for intensity in rehab       Able to check pulse independently Yes       Intervention Provide education and demonstration on how to check pulse in carotid and radial arteries.;Review the importance of being able to check your own pulse for safety during independent exercise       Expected Outcomes Short Term: Able to explain why pulse checking is important during independent exercise;Long Term: Able to check pulse independently and accurately       Understanding of Exercise Prescription Yes       Intervention Provide education, explanation, and written materials on patient's  individual exercise prescription       Expected Outcomes Short Term: Able to explain program exercise prescription;Long Term: Able to explain home exercise prescription to exercise independently              Exercise Goals Re-Evaluation :    Discharge Exercise Prescription (Final Exercise Prescription Changes):   Nutrition:  Target Goals: Understanding of nutrition guidelines, daily intake of sodium 1500mg , cholesterol 200mg , calories 30% from fat and 7% or less from saturated fats, daily to have 5 or more servings of fruits and vegetables.  Biometrics:  Pre  Biometrics - 05/23/20 1523      Pre Biometrics   Height 5' 2.5" (1.588 m)    Weight 58.9 kg    Waist Circumference 34.5 inches    Hip Circumference 38 inches    Waist to Hip Ratio 0.91 %    BMI (Calculated) 23.36    Triceps Skinfold 11 mm    % Body Fat 32.7 %    Grip Strength 25 kg    Flexibility 13 in    Single Leg Stand 20.43 seconds            Nutrition Therapy Plan and Nutrition Goals:   Nutrition Assessments:   Nutrition Goals Re-Evaluation:   Nutrition Goals Discharge (Final Nutrition Goals Re-Evaluation):   Psychosocial: Target Goals: Acknowledge presence or absence of significant depression and/or stress, maximize coping skills, provide positive support system. Participant is able to verbalize types and ability to use techniques and skills needed for reducing stress and depression.  Initial Review & Psychosocial Screening:  Initial Psych Review & Screening - 05/23/20 1551      Initial Review   Current issues with Current Depression;History of Depression;Current Anxiety/Panic;Current Stress Concerns    Source of Stress Concerns Chronic Illness    Comments Calianne voices being depressed over her recent hospitalization times 2      [place]? Yes   Makynleigh lives with her daughter and has a total of 3 children for support     Barriers   Psychosocial barriers to participate in program The patient should benefit from training in stress management and relaxation.      Screening Interventions   Interventions Encouraged to exercise;Provide feedback about the scores to participant;To provide support and resources with identified psychosocial needs    Expected Outcomes Short Term goal: Utilizing psychosocial counselor, staff and physician to assist with identification of specific Stressors or current issues interfering with healing process. Setting desired goal for each stressor or current issue identified.;Long Term Goal: Stressors or current  issues are controlled or eliminated.;Short Term goal: Identification and review with participant of any Quality of Life or Depression concerns found by scoring the questionnaire.;Long Term goal: The participant improves quality of Life and PHQ9 Scores as seen by post scores and/or verbalization of changes           Quality of Life Scores:  Quality of Life - 05/23/20 1533      Quality of Life   Select Quality of Life      Quality of Life Scores   Health/Function Pre 26.18 %    Socioeconomic Pre 26.17 %    Psych/Spiritual Pre 27.93 %    Family Pre 30 %    GLOBAL Pre 27.06 %          Scores of 19 and below usually indicate a poorer quality of life in these areas.  A difference of  2-3 points is  a clinically meaningful difference.  A difference of 2-3 points in the total score of the Quality of Life Index has been associated with significant improvement in overall quality of life, self-image, physical symptoms, and general health in studies assessing change in quality of life.  PHQ-9: Recent Review Flowsheet Data    Depression screen [facility] 2/9 05/23/2020 04/06/2020 06/11/2017   Decreased Interest 0 1 1   Down, Depressed, Hopeless 1 1 1    PHQ - 2 Score 1 2 2    Altered sleeping - 0 0   Tired, decreased energy - 1 2   Change in appetite - 0 0   Feeling bad or failure about yourself  - 0 0   Trouble concentrating - 1 1   Moving slowly or fidgety/restless - 0 0   Suicidal thoughts - 0 0   PHQ-9 Score - 4 5   Difficult doing work/chores - Not difficult at all Not difficult at all     Interpretation of Total Score  Total Score Depression Severity:  1-4 = Minimal depression, 5-9 = Mild depression, 10-14 = Moderate depression, 15-19 = Moderately severe depression, 20-27 = Severe depression   Psychosocial Evaluation and Intervention:   Psychosocial Re-Evaluation:   Psychosocial Discharge (Final Psychosocial Re-Evaluation):   Vocational Rehabilitation: Provide vocational rehab  assistance to qualifying candidates.   Vocational Rehab Evaluation & Intervention:  Vocational Rehab - 05/23/20 1554      Initial Vocational Rehab Evaluation & Intervention   Assessment shows need for Vocational Rehabilitation No   Ramisa is retired and does not need vocational rehab at this time          Education: Education Goals: Education classes will be provided on a weekly basis, covering required topics. Participant will state understanding/return demonstration of topics presented.  Learning Barriers/Preferences:  Learning Barriers/Preferences - 05/23/20 1533      Learning Barriers/Preferences   Learning Barriers Hearing;Sight    Learning Preferences Written Material;Skilled Demonstration;Individual Instruction           Education Topics: Hypertension, Hypertension Reduction -Define heart disease and high blood pressure. Discus how high blood pressure affects the body and ways to reduce high blood pressure.   Exercise and Your Heart -Discuss why it is important to exercise, the FITT principles of exercise, normal and abnormal responses to exercise, and how to exercise safely.   Angina -Discuss definition of angina, causes of angina, treatment of angina, and how to decrease risk of having angina.   Cardiac Medications -Review what the following cardiac medications are used for, how they affect the body, and side effects that may occur when taking the medications.  Medications include Aspirin, Beta blockers, calcium channel blockers, ACE Inhibitors, angiotensin receptor blockers, diuretics, digoxin, and antihyperlipidemics.   Congestive Heart Failure -Discuss the definition of CHF, how to live with CHF, the signs and symptoms of CHF, and how keep track of weight and sodium intake.   Heart Disease and Intimacy -Discus the effect sexual activity has on the heart, how changes occur during intimacy as we age, and safety during sexual activity.   Smoking Cessation /  COPD -Discuss different methods to quit smoking, the health benefits of quitting smoking, and the definition of COPD.   Nutrition I: Fats -Discuss the types of cholesterol, what cholesterol does to the heart, and how cholesterol levels can be controlled.   Nutrition II: Labels -Discuss the different components of food labels and how to read food label   Heart Parts/Heart Disease and PAD -  Discuss the anatomy of the heart, the pathway of blood circulation through the heart, and these are affected by heart disease.   Stress I: Signs and Symptoms -Discuss the causes of stress, how stress may lead to anxiety and depression, and ways to limit stress.   Stress II: Relaxation -Discuss different types of relaxation techniques to limit stress.   Warning Signs of Stroke / TIA -Discuss definition of a stroke, what the signs and symptoms are of a stroke, and how to identify when someone is having stroke.   Knowledge Questionnaire Score:  Knowledge Questionnaire Score - 05/23/20 1533      Knowledge Questionnaire Score   Pre Score 18/24           Core Components/Risk Factors/Patient Goals at Admission:  Personal Goals and Risk Factors at Admission - 05/24/20 0817      Core Components/Risk Factors/Patient Goals on Admission    Weight Management Weight Maintenance;Yes    Intervention Weight Management: Develop a combined nutrition and exercise program designed to reach desired caloric intake, while maintaining appropriate intake of nutrient and fiber, sodium and fats, and appropriate energy expenditure required for the weight goal.;Weight Management: Provide education and appropriate resources to help participant work on and attain dietary goals.    Expected Outcomes Short Term: Continue to assess and modify interventions until short term weight is achieved;Weight Maintenance: Understanding of the daily nutrition guidelines, which includes 25-35% calories from fat, 7% or less cal from  saturated fats, less than 200mg  cholesterol, less than 1.5gm of sodium, & 5 or more servings of fruits and vegetables daily;Understanding recommendations for meals to include 15-35% energy as protein, 25-35% energy from fat, 35-60% energy from carbohydrates, less than 200mg  of dietary cholesterol, 20-35 gm of total fiber daily;Understanding of distribution of calorie intake throughout the day with the consumption of 4-5 meals/snacks    Hypertension Yes    Intervention Provide education on lifestyle modifcations including regular physical activity/exercise, weight management, moderate sodium restriction and increased consumption of fresh fruit, vegetables, and low fat dairy, alcohol moderation, and smoking cessation.;Monitor prescription use compliance.    Expected Outcomes Short Term: Continued assessment and intervention until BP is < 140/66mm HG in hypertensive participants. < 130/42mm HG in hypertensive participants with diabetes, heart failure or chronic kidney disease.;Long Term: Maintenance of blood pressure at goal levels.    Lipids Yes    Intervention Provide education and support for participant on nutrition & aerobic/resistive exercise along with prescribed medications to achieve LDL 70mg , HDL >40mg .    Expected Outcomes Short Term: Participant states understanding of desired cholesterol values and is compliant with medications prescribed. Participant is following exercise prescription and nutrition guidelines.;Long Term: Cholesterol controlled with medications as prescribed, with individualized exercise RX and with personalized nutrition plan. Value goals: LDL < 70mg , HDL > 40 mg.    Stress Yes    Intervention Offer individual and/or small group education and counseling on adjustment to heart disease, stress management and health-related lifestyle change. Teach and support self-help strategies.;Refer participants experiencing significant psychosocial distress to appropriate mental health  specialists for further evaluation and treatment. When possible, include family members and significant others in education/counseling sessions.    Expected Outcomes Short Term: Participant demonstrates changes in health-related behavior, relaxation and other stress management skills, ability to obtain effective social support, and compliance with psychotropic medications if prescribed.;Long Term: Emotional wellbeing is indicated by absence of clinically significant psychosocial distress or social isolation.  Core Components/Risk Factors/Patient Goals Review:    Core Components/Risk Factors/Patient Goals at Discharge (Final Review):    ITP Comments:  ITP Comments    Row Name 05/23/20 1521           ITP Comments Dr. Fransico Him, Medical Director              Comments: Mallissa attended orientation on 05/23/20 to review rules and guidelines for program.  Completed 6 minute walk test, Intitial ITP, and exercise prescription.  VSS. Telemetry-Sinus Rhythm with an occasional PVC's.  Asymptomatic. Safety measures and social distancing in place per CDC guidelines.Barnet Pall, RN,BSN 05/24/2020 8:22 AM

## 2020-05-27 ENCOUNTER — Encounter (HOSPITAL_COMMUNITY)
Admission: RE | Admit: 2020-05-27 | Discharge: 2020-05-27 | Disposition: A | Discharge status: Home / Self Care | Payer: Medicare Other | Source: Ambulatory Visit | Attending: Cardiology | Admitting: Cardiology

## 2020-05-27 ENCOUNTER — Other Ambulatory Visit: Payer: Self-pay

## 2020-05-27 DIAGNOSIS — Z955 Presence of coronary angioplasty implant and graft: Secondary | ICD-10-CM | POA: Diagnosis not present

## 2020-05-27 DIAGNOSIS — I2102 ST elevation (STEMI) myocardial infarction involving left anterior descending coronary artery: Secondary | ICD-10-CM | POA: Diagnosis not present

## 2020-05-27 NOTE — Progress Notes (Signed)
Daily Session Note  Patient Details  Name: QUENNA DOEPKE MRN: [id number] Date of Birth: [birth year] Referring Provider:     CARDIAC REHAB PHASE II ORIENTATION from 05/23/2020 in [place]  Referring Provider Dr Herbie [place] Preli-Dr Fransico Him MD, Covering      Encounter Date: 05/27/2020  Check In:  Session Check In - 05/27/20 0930      Check-In   Supervising physician immediately available to respond to emergencies [facility] immediately available    Physician(s) Dr. Kurtis Bushman    Location [facility]    Staff Present Dorma Russell, MS,ACSM CEP, Exercise Physiologist;Ieisha Gao, RN, Milus Glazier, MS, EP-C, CCRP;Portia Rollene Rotunda, RN, Deland Pretty, MS, ACSM CEP, Exercise Physiologist    Virtual Visit No    Medication changes reported     No    Fall or balance concerns reported    No    Tobacco Cessation No Change    Warm-up and Cool-down Performed on first and last piece of equipment    Resistance Training Performed Yes    VAD Patient? No    PAD/SET Patient? No      Pain Assessment   Currently in Pain? No/denies    Pain Score 0-No pain    Multiple Pain Sites No           Capillary Blood Glucose: No results found for this or any previous visit (from the past 24 hour(s)).   Exercise Prescription Changes - 05/27/20 1000      Response to Exercise   Blood Pressure (Admit) 100/60    Blood Pressure (Exercise) 110/64    Blood Pressure (Exit) 110/70    Heart Rate (Admit) 82 bpm    Heart Rate (Exercise) 91 bpm    Heart Rate (Exit) 77 bpm    Rating of Perceived Exertion (Exercise) 12    Perceived Dyspnea (Exercise) 0    Symptoms None    Comments Pt's first day of exercise    Duration Progress to 30 minutes of  aerobic without signs/symptoms of physical distress    Intensity THRR unchanged      Progression   Progression Continue to progress workloads to maintain intensity without signs/symptoms of physical distress.     Average METs 2.8      Resistance Training   Training Prescription Yes    Weight 3lbs    Reps 10-15    Time 10 Minutes      Interval Training   Interval Training No      Bike   Level 1.5    Watts 25    Minutes 15    METs 3.1      NuStep   Level 2    SPM 75    Minutes 15    METs 2.5           Social History   Tobacco Use  Smoking Status Never Smoker  Smokeless Tobacco Never Used    Goals Met:  Exercise tolerated well No report of cardiac concerns or symptoms Strength training completed today  Goals Unmet:  Not Applicable  Comments: Briane started cardiac rehab today.  Pt tolerated light exercise without difficulty. VSS, telemetry-Sinus Rhythm, rare PVC, asymptomatic.  Medication list reconciled. Pt denies barriers to medicaiton compliance.  PSYCHOSOCIAL ASSESSMENT:  PHQ-0. Pt exhibits positive coping skills, hopeful outlook with supportive family. Arlissa says she is currently depressed and is taking an antidepressant and sees a therapist on a weekly basis whom she just started seeing  since her hospitalization.    Pt enjoys dancing and going to yard sales.   Pt oriented to exercise equipment and routine.    Understanding verbalized.Barnet Pall, RN,BSN 05/27/2020 12:12 PM   Dr. Fransico Him is Medical Director for Cardiac Rehab at [facility].

## 2020-05-29 ENCOUNTER — Encounter (HOSPITAL_COMMUNITY)
Admission: RE | Admit: 2020-05-29 | Discharge: 2020-05-29 | Disposition: A | Discharge status: Home / Self Care | Payer: Medicare Other | Source: Ambulatory Visit | Attending: Cardiology | Admitting: Cardiology

## 2020-05-29 ENCOUNTER — Other Ambulatory Visit: Payer: Self-pay

## 2020-05-29 ENCOUNTER — Ambulatory Visit: Payer: Medicare Other | Admitting: CARDIOLOGY

## 2020-05-29 DIAGNOSIS — Z955 Presence of coronary angioplasty implant and graft: Secondary | ICD-10-CM | POA: Diagnosis not present

## 2020-05-29 DIAGNOSIS — I2102 ST elevation (STEMI) myocardial infarction involving left anterior descending coronary artery: Secondary | ICD-10-CM | POA: Diagnosis not present

## 2020-05-29 NOTE — Progress Notes (Signed)
Suzanne Galloway 79 y.o. female Nutrition Note  Visit Diagnosis: 03/10/20 STEMI, DES LAD   ST elevation myocardial infarction involving left anterior descending (LAD) coronary artery [facility])  Past Medical History:  Diagnosis Date  . Anxiety   . Arthritis    "hands, back, toes" (01/01/2015)  . Asthma   . Chronic bronchitis ([place])    "get it just about q yr" (01/01/2015)  . Coronary artery disease    S/P STEMI 03/10/20, DES LAD, VFib Arrest  . Depression   . GERD (gastroesophageal reflux disease)   . Hearing loss    hearing aids  . High cholesterol   . Hyperlipemia   . Hypertension   . Hypothyroidism   . IBS (irritable bowel syndrome)   . Mitral valve prolapse   . Seizures [facility])    dx Aug 2018, most recent seizure 10/13/17, partial complex  . Urinary incontinence      Medications reviewed.   Current Outpatient Medications:  .  Ascorbic Acid (VITAMIN C PO), Take 1 tablet by mouth daily., Disp: , Rfl:  .  aspirin 81 MG chewable tablet, Chew 81 mg by mouth in the morning and at bedtime. , Disp: , Rfl:  .  atorvastatin (LIPITOR) 80 MG tablet, Take 80 mg by mouth daily., Disp: , Rfl:  .  Calcium Carbonate-Vitamin D (CALCIUM 600+D) 600-400 MG-UNIT tablet, Take 1 tablet by mouth daily., Disp: , Rfl:  .  escitalopram (LEXAPRO) 20 MG tablet, Take 20 mg by mouth daily., Disp: , Rfl: 11 .  famotidine (PEPCID) 40 MG tablet, Take 40 mg by mouth 2 (two) times daily. , Disp: , Rfl:  .  isosorbide mononitrate (IMDUR) 30 MG 24 hr tablet, Take 30 mg by mouth daily., Disp: , Rfl:  .  levothyroxine (SYNTHROID) 75 MCG tablet, Take 75 mcg by mouth every morning. , Disp: , Rfl:  .  loperamide (IMODIUM A-D) 2 MG tablet, Take 2 mg by mouth 4 (four) times daily as needed for diarrhea or loose stools., Disp: , Rfl:  .  metoprolol succinate (TOPROL-XL) 25 MG 24 hr tablet, Take 25 mg by mouth daily., Disp: , Rfl:  .  Multiple Vitamins-Minerals (PRESERVISION AREDS PO), Take 1 tablet by mouth daily. , Disp: , Rfl:   .  nitroGLYCERIN (NITROSTAT) 0.4 MG SL tablet, Place 0.4 mg under the tongue every 5 (five) minutes as needed for chest pain., Disp: , Rfl:  .  Polyethyl Glycol-Propyl Glycol (SYSTANE OP), Apply 1 drop to eye daily as needed., Disp: , Rfl:  .  sacubitril-valsartan (ENTRESTO) 24-26 MG, Take 1 tablet by mouth 2 (two) times daily. , Disp: , Rfl:  .  spironolactone (ALDACTONE) 25 MG tablet, Take 25 mg by mouth daily., Disp: , Rfl:  .  sucralfate (CARAFATE) 1 g tablet, Take 1 g by mouth 4 (four) times daily -  with meals and at bedtime., Disp: , Rfl:  .  ticagrelor (BRILINTA) 90 MG TABS tablet, Take 90 mg by mouth 2 (two) times daily. , Disp: , Rfl:  .  [name] 50 MG TABS tablet, Take 1 tablet (50 mg total) by mouth 2 (two) times daily. (Patient taking differently: Take 50 mg by mouth 2 (two) times daily. ), Disp: 180 tablet, Rfl: 4 .  zonisamide (ZONEGRAN) 100 MG capsule, Take 2 capsules (200 mg total) by mouth at bedtime., Disp: 180 capsule, Rfl: 4   Ht Readings from Last 1 Encounters:  05/23/20 5' 2.5" (1.588 m)     Wt Readings from  Last 3 Encounters:  05/23/20 129 lb 13.6 oz (58.9 kg)  10/03/19 129 lb (58.5 kg)  05/29/19 130 lb 6.4 oz (59.1 kg)     There is no height or weight on file to calculate BMI.   Social History   Tobacco Use  Smoking Status Never Smoker  Smokeless Tobacco Never Used     Lab Results  Component Value Date   CHOL 174 01/02/2015   Lab Results  Component Value Date   HDL 37 (L) 01/02/2015   Lab Results  Component Value Date   LDLCALC 105 (H) 01/02/2015   Lab Results  Component Value Date   TRIG 158 (H) 01/02/2015     Lab Results  Component Value Date   HGBA1C 6.3 (H) 01/03/2015     CBG (last 3)  No results for input(s): GLUCAP in the last 72 hours.   Nutrition Note  Spoke with pt. Nutrition Plan and Nutrition Survey goals reviewed with pt.  Pt has Pre-diabetes. Last A1c indicates blood glucose well-controlled at 5.8.   Pt with poor  appetite and 5.9% weight loss (self reported UBW 135 lbs and current weight of 127 lbs). Poor appetite and weight loss has been occurring over past 2.5 months since MI.   She experiences diarrhea ~1x/week and reports this ongoing for her lifetime. She has recently had a med change and added an Anti-diarrheal med.  Diet recall: B- 1 egg, 1 [place] bacon, 1 mini bagel L- Canned soup, 4 saltines D- Canned soup, 4 saltines 2 cans coke for snacks, sometimes graham crackers or belvita  Pt amenable to adding peanut butter to snacks to increase calories.  Pt expressed understanding of the information reviewed.    Nutrition Diagnosis ? Unintentional wt loss related to poor appetite as evidenced by wt loss of 5% in 2.5 months.  Nutrition Intervention ? Pt's individual nutrition plan reviewed with pt. ? Benefits of adopting Heart Healthy diet discussed when Medficts reviewed. ? Continue client-centered nutrition education by RD, as part of interdisciplinary care.  Goal(s) ? Pt to identify food quantities necessary to maintain weight between 125-130 lbs ? Pt to build a healthy plate including vegetables, fruits, whole grains, and low-fat dairy products in a heart healthy meal plan.  Plan:   Will provide client-centered nutrition education as part of interdisciplinary care  Monitor and evaluate progress toward nutrition goal with team.   Michaele Offer, MS, RDN, LDN

## 2020-05-29 NOTE — Progress Notes (Signed)
Cardiac Individual Treatment Plan  Patient Details  Name: Suzanne Galloway MRN: [id number] Date of Birth: November 24, 1940 Referring Provider:     CARDIAC REHAB PHASE II ORIENTATION from 05/23/2020 in [facility]  Referring Provider Dr Herbie [place] Preli-Dr Fransico Him MD, Covering      Initial Encounter Date:    CARDIAC REHAB PHASE II ORIENTATION from 05/23/2020 in [facility]  Date 05/23/20      Visit Diagnosis: 03/10/20 STEMI, DES LAD   Patient's Home Medications on Admission:  Current Outpatient Medications:  .  Ascorbic Acid (VITAMIN C PO), Take 1 tablet by mouth daily., Disp: , Rfl:  .  aspirin 81 MG chewable tablet, Chew 81 mg by mouth in the morning and at bedtime. , Disp: , Rfl:  .  atorvastatin (LIPITOR) 80 MG tablet, Take 80 mg by mouth daily., Disp: , Rfl:  .  Calcium Carbonate-Vitamin D (CALCIUM 600+D) 600-400 MG-UNIT tablet, Take 1 tablet by mouth daily., Disp: , Rfl:  .  escitalopram (LEXAPRO) 20 MG tablet, Take 20 mg by mouth daily., Disp: , Rfl: 11 .  famotidine (PEPCID) 40 MG tablet, Take 40 mg by mouth 2 (two) times daily. , Disp: , Rfl:  .  isosorbide mononitrate (IMDUR) 30 MG 24 hr tablet, Take 30 mg by mouth daily., Disp: , Rfl:  .  levothyroxine (SYNTHROID) 75 MCG tablet, Take 75 mcg by mouth every morning. , Disp: , Rfl:  .  loperamide (IMODIUM A-D) 2 MG tablet, Take 2 mg by mouth 4 (four) times daily as needed for diarrhea or loose stools., Disp: , Rfl:  .  metoprolol succinate (TOPROL-XL) 25 MG 24 hr tablet, Take 25 mg by mouth daily., Disp: , Rfl:  .  Multiple Vitamins-Minerals (PRESERVISION AREDS PO), Take 1 tablet by mouth daily. , Disp: , Rfl:  .  nitroGLYCERIN (NITROSTAT) 0.4 MG SL tablet, Place 0.4 mg under the tongue every 5 (five) minutes as needed for chest pain., Disp: , Rfl:  .  Polyethyl Glycol-Propyl Glycol (SYSTANE OP), Apply 1 drop to eye daily as needed., Disp: , Rfl:  .  sacubitril-valsartan  (ENTRESTO) 24-26 MG, Take 1 tablet by mouth 2 (two) times daily. , Disp: , Rfl:  .  spironolactone (ALDACTONE) 25 MG tablet, Take 25 mg by mouth daily., Disp: , Rfl:  .  sucralfate (CARAFATE) 1 g tablet, Take 1 g by mouth 4 (four) times daily -  with meals and at bedtime., Disp: , Rfl:  .  ticagrelor (BRILINTA) 90 MG TABS tablet, Take 90 mg by mouth 2 (two) times daily. , Disp: , Rfl:  .  [name] 50 MG TABS tablet, Take 1 tablet (50 mg total) by mouth 2 (two) times daily. (Patient taking differently: Take 50 mg by mouth 2 (two) times daily. ), Disp: 180 tablet, Rfl: 4 .  zonisamide (ZONEGRAN) 100 MG capsule, Take 2 capsules (200 mg total) by mouth at bedtime., Disp: 180 capsule, Rfl: 4  Past Medical History: Past Medical History:  Diagnosis Date  . Anxiety   . Arthritis    "hands, back, toes" (01/01/2015)  . Asthma   . Chronic bronchitis ([place])    "get it just about q yr" (01/01/2015)  . Coronary artery disease    S/P STEMI 03/10/20, DES LAD, VFib Arrest  . Depression   . GERD (gastroesophageal reflux disease)   . Hearing loss    hearing aids  . High cholesterol   . Hyperlipemia   .  Hypertension   . Hypothyroidism   . IBS (irritable bowel syndrome)   . Mitral valve prolapse   . Seizures [facility])    dx Aug 2018, most recent seizure 10/13/17, partial complex  . Urinary incontinence     Tobacco Use: Social History   Tobacco Use  Smoking Status Never Smoker  Smokeless Tobacco Never Used    Labs: Recent Review Flowsheet Data    Labs for ITP Cardiac and Pulmonary Rehab Latest Ref Rng & Units 12/27/2014 01/02/2015 01/03/2015   Cholestrol 0 - 200 mg/dL 182 174 -   LDLCALC 0 - 99 mg/dL 96 105(H) -   HDL >39 mg/dL 38(L) 37(L) -   Trlycerides <150 mg/dL 241(H) 158(H) -   Hemoglobin A1c 4.8 - 5.6 % - - 6.3(H)      Capillary Blood Glucose: Lab Results  Component Value Date   GLUCAP 106 (H) 02/20/2017   GLUCAP 101 (H) 02/20/2017   GLUCAP 106 (H) 02/19/2017   GLUCAP 112 (H) 02/19/2017    GLUCAP 171 (H) 01/06/2015     Exercise Target Goals: Exercise Program Goal: Individual exercise prescription set using results from initial 6 min walk test and THRR while considering  patient's activity barriers and safety.   Exercise Prescription Goal: Starting with aerobic activity 30 plus minutes a day, 3 days per week for initial exercise prescription. Provide home exercise prescription and guidelines that participant acknowledges understanding prior to discharge.  Activity Barriers & Risk Stratification:  Activity Barriers & Cardiac Risk Stratification - 05/23/20 1522      Activity Barriers & Cardiac Risk Stratification   Activity Barriers Back Problems    Cardiac Risk Stratification High           6 Minute Walk:  6 Minute Walk    Row Name 05/23/20 1521         6 Minute Walk   Phase Initial     Distance 1520 feet     Walk Time 6 minutes     # of Rest Breaks 0     MPH 2.9     METS 3.1     RPE 12     Perceived Dyspnea  0     VO2 Peak 10.7     Symptoms No     Resting HR 67 bpm     Resting BP 118/62     Resting Oxygen Saturation  99 %     Exercise Oxygen Saturation  during 6 min walk 98 %     Max Ex. HR 102 bpm     Max Ex. BP 140/60     2 Minute Post BP 120/70            Oxygen Initial Assessment:   Oxygen Re-Evaluation:   Oxygen Discharge (Final Oxygen Re-Evaluation):   Initial Exercise Prescription:  Initial Exercise Prescription - 05/23/20 1500      Date of Initial Exercise RX and Referring Provider   Date 05/23/20    Referring Provider Dr Herbie [place] Preli-Dr Fransico Him MD, Covering    Expected Discharge Date 07/19/20      Bike   Level 1.5    Watts 20    Minutes 15    METs 2.7      NuStep   Level 2    SPM 75    Minutes 15    METs 2.5      Prescription Details   Frequency (times per week) 3x    Duration Progress to 10 minutes continuous walking  at current work load and total walking time to 30-45 min      Intensity   THRR  40-80% of Max Heartrate 57-114    Ratings of Perceived Exertion 11-13    Perceived Dyspnea 0-4      Progression   Progression Continue progressive overload as per policy without signs/symptoms or physical distress.      Resistance Training   Training Prescription Yes    Weight 3lbs    Reps 10-15           Perform Capillary Blood Glucose checks as needed.  Exercise Prescription Changes:   Exercise Prescription Changes    Row Name 05/27/20 1000             Response to Exercise   Blood Pressure (Admit) 100/60       Blood Pressure (Exercise) 110/64       Blood Pressure (Exit) 110/70       Heart Rate (Admit) 82 bpm       Heart Rate (Exercise) 91 bpm       Heart Rate (Exit) 77 bpm       Rating of Perceived Exertion (Exercise) 12       Perceived Dyspnea (Exercise) 0       Symptoms None       Comments Pt's first day of exercise       Duration Progress to 30 minutes of  aerobic without signs/symptoms of physical distress       Intensity THRR unchanged         Progression   Progression Continue to progress workloads to maintain intensity without signs/symptoms of physical distress.       Average METs 2.8         Resistance Training   Training Prescription Yes       Weight 3lbs       Reps 10-15       Time 10 Minutes         Interval Training   Interval Training No         Bike   Level 1.5       Watts 25       Minutes 15       METs 3.1         NuStep   Level 2       SPM 75       Minutes 15       METs 2.5              Exercise Comments:   Exercise Comments    Row Name 05/27/20 1021           Exercise Comments Pt's first day of exercise. Pt responded well to workloads and exericse equipment. Will continue to monitor and progress as tolerated.              Exercise Goals and Review:   Exercise Goals    Row Name 05/23/20 1522             Exercise Goals   Increase Physical Activity Yes       Intervention Provide advice, education, support  and counseling about physical activity/exercise needs.;Develop an individualized exercise prescription for aerobic and resistive training based on initial evaluation findings, risk stratification, comorbidities and participant's personal goals.       Expected Outcomes Short Term: Attend rehab on a regular basis to increase amount of physical activity.;Long Term: Add in home exercise to make exercise part of routine  and to increase amount of physical activity.;Long Term: Exercising regularly at least 3-5 days a week.       Increase Strength and Stamina Yes       Intervention Provide advice, education, support and counseling about physical activity/exercise needs.;Develop an individualized exercise prescription for aerobic and resistive training based on initial evaluation findings, risk stratification, comorbidities and participant's personal goals.       Expected Outcomes Short Term: Increase workloads from initial exercise prescription for resistance, speed, and METs.;Short Term: Perform resistance training exercises routinely during rehab and add in resistance training at home;Long Term: Improve cardiorespiratory fitness, muscular endurance and strength as measured by increased METs and functional capacity (6MWT)       Able to understand and use rate of perceived exertion (RPE) scale Yes       Intervention Provide education and explanation on how to use RPE scale       Expected Outcomes Short Term: Able to use RPE daily in rehab to express subjective intensity level;Long Term:  Able to use RPE to guide intensity level when exercising independently       Knowledge and understanding of Target Heart Rate Range (THRR) Yes       Intervention Provide education and explanation of THRR including how the numbers were predicted and where they are located for reference       Expected Outcomes Short Term: Able to state/look up THRR;Long Term: Able to use THRR to govern intensity when exercising independently;Short  Term: Able to use daily as guideline for intensity in rehab       Able to check pulse independently Yes       Intervention Provide education and demonstration on how to check pulse in carotid and radial arteries.;Review the importance of being able to check your own pulse for safety during independent exercise       Expected Outcomes Short Term: Able to explain why pulse checking is important during independent exercise;Long Term: Able to check pulse independently and accurately       Understanding of Exercise Prescription Yes       Intervention Provide education, explanation, and written materials on patient's individual exercise prescription       Expected Outcomes Short Term: Able to explain program exercise prescription;Long Term: Able to explain home exercise prescription to exercise independently              Exercise Goals Re-Evaluation :  Exercise Goals Re-Evaluation    Row Name 05/27/20 1022             Exercise Goal Re-Evaluation   Exercise Goals Review Able to understand and use rate of perceived exertion (RPE) scale;Knowledge and understanding of Target Heart Rate Range (THRR);Understanding of Exercise Prescription       Comments Pt's first day of exercise. Pt oriented to exercise equipment and prescription. Pt able to exercise 30 minutes with minimal difficulty.       Expected Outcomes Pt will continue to increase cardiovascular fitness. Will continue to monitor.               Discharge Exercise Prescription (Final Exercise Prescription Changes):  Exercise Prescription Changes - 05/27/20 1000      Response to Exercise   Blood Pressure (Admit) 100/60    Blood Pressure (Exercise) 110/64    Blood Pressure (Exit) 110/70    Heart Rate (Admit) 82 bpm    Heart Rate (Exercise) 91 bpm    Heart Rate (Exit) 77 bpm  Rating of Perceived Exertion (Exercise) 12    Perceived Dyspnea (Exercise) 0    Symptoms None    Comments Pt's first day of exercise    Duration Progress to  30 minutes of  aerobic without signs/symptoms of physical distress    Intensity THRR unchanged      Progression   Progression Continue to progress workloads to maintain intensity without signs/symptoms of physical distress.    Average METs 2.8      Resistance Training   Training Prescription Yes    Weight 3lbs    Reps 10-15    Time 10 Minutes      Interval Training   Interval Training No      Bike   Level 1.5    Watts 25    Minutes 15    METs 3.1      NuStep   Level 2    SPM 75    Minutes 15    METs 2.5           Nutrition:  Target Goals: Understanding of nutrition guidelines, daily intake of sodium 1500mg , cholesterol 200mg , calories 30% from fat and 7% or less from saturated fats, daily to have 5 or more servings of fruits and vegetables.  Biometrics:  Pre Biometrics - 05/23/20 1523      Pre Biometrics   Height 5' 2.5" (1.588 m)    Weight 58.9 kg    Waist Circumference 34.5 inches    Hip Circumference 38 inches    Waist to Hip Ratio 0.91 %    BMI (Calculated) 23.36    Triceps Skinfold 11 mm    % Body Fat 32.7 %    Grip Strength 25 kg    Flexibility 13 in    Single Leg Stand 20.43 seconds            Nutrition Therapy Plan and Nutrition Goals:  Nutrition Therapy & Goals - 05/29/20 0955      Nutrition Therapy   Diet Heart Healthy    Drug/Food Interactions Statins/Certain Fruits      Personal Nutrition Goals   Nutrition Goal Pt to identify food quantities necessary to maintain weight between 125-130 lbs    Personal Goal #2 Pt to build a healthy plate including vegetables, fruits, whole grains, and low-fat dairy products in a heart healthy meal plan.      Intervention Plan   Intervention Prescribe, educate and counsel regarding individualized specific dietary modifications aiming towards targeted core components such as weight, hypertension, lipid management, diabetes, heart failure and other comorbidities.;Nutrition handout(s) given to patient.     Expected Outcomes Short Term Goal: A plan has been developed with personal nutrition goals set during dietitian appointment.;Long Term Goal: Adherence to prescribed nutrition plan.           Nutrition Assessments:  Nutrition Assessments - 05/29/20 0956      MEDFICTS Scores   Pre Score 15           Nutrition Goals Re-Evaluation:  Nutrition Goals Re-Evaluation    [place] Name 05/29/20 0956             Goals   Current Weight 129 lb (58.5 kg)       Nutrition Goal Pt to identify food quantities necessary to maintain weight between 125-130 lbs         Personal Goal #2 Re-Evaluation   Personal Goal #2 Pt to build a healthy plate including vegetables, fruits, whole grains, and low-fat dairy products in a  heart healthy meal plan.              Nutrition Goals Discharge (Final Nutrition Goals Re-Evaluation):  Nutrition Goals Re-Evaluation - 05/29/20 0956      Goals   Current Weight 129 lb (58.5 kg)    Nutrition Goal Pt to identify food quantities necessary to maintain weight between 125-130 lbs      Personal Goal #2 Re-Evaluation   Personal Goal #2 Pt to build a healthy plate including vegetables, fruits, whole grains, and low-fat dairy products in a heart healthy meal plan.           Psychosocial: Target Goals: Acknowledge presence or absence of significant depression and/or stress, maximize coping skills, provide positive support system. Participant is able to verbalize types and ability to use techniques and skills needed for reducing stress and depression.  Initial Review & Psychosocial Screening:  Initial Psych Review & Screening - 05/23/20 1551      Initial Review   Current issues with Current Depression;History of Depression;Current Anxiety/Panic;Current Stress Concerns    Source of Stress Concerns Chronic Illness    Comments Jayla voices being depressed over her recent hospitalization times 2      [place]? Yes   Kenley lives with her  daughter and has a total of 3 children for support     Barriers   Psychosocial barriers to participate in program The patient should benefit from training in stress management and relaxation.      Screening Interventions   Interventions Encouraged to exercise;Provide feedback about the scores to participant;To provide support and resources with identified psychosocial needs    Expected Outcomes Short Term goal: Utilizing psychosocial counselor, staff and physician to assist with identification of specific Stressors or current issues interfering with healing process. Setting desired goal for each stressor or current issue identified.;Long Term Goal: Stressors or current issues are controlled or eliminated.;Short Term goal: Identification and review with participant of any Quality of Life or Depression concerns found by scoring the questionnaire.;Long Term goal: The participant improves quality of Life and PHQ9 Scores as seen by post scores and/or verbalization of changes           Quality of Life Scores:  Quality of Life - 05/23/20 1533      Quality of Life   Select Quality of Life      Quality of Life Scores   Health/Function Pre 26.18 %    Socioeconomic Pre 26.17 %    Psych/Spiritual Pre 27.93 %    Family Pre 30 %    GLOBAL Pre 27.06 %          Scores of 19 and below usually indicate a poorer quality of life in these areas.  A difference of  2-3 points is a clinically meaningful difference.  A difference of 2-3 points in the total score of the Quality of Life Index has been associated with significant improvement in overall quality of life, self-image, physical symptoms, and general health in studies assessing change in quality of life.  PHQ-9: Recent Review Flowsheet Data    Depression screen [facility] 2/9 05/27/2020 05/23/2020 04/06/2020 06/11/2017   Decreased Interest 0 0 1 1   Down, Depressed, Hopeless 1 1 1 1    PHQ - 2 Score 1 1 2 2    Altered sleeping - - 0 0   Tired, decreased energy  - - 1 2   Change in appetite - - 0 0   Feeling  bad or failure about yourself  - - 0 0   Trouble concentrating - - 1 1   Moving slowly or fidgety/restless - - 0 0   Suicidal thoughts - - 0 0   PHQ-9 Score - - 4 5   Difficult doing work/chores - - Not difficult at all Not difficult at all     Interpretation of Total Score  Total Score Depression Severity:  1-4 = Minimal depression, 5-9 = Mild depression, 10-14 = Moderate depression, 15-19 = Moderately severe depression, 20-27 = Severe depression   Psychosocial Evaluation and Intervention:   Psychosocial Re-Evaluation:  Psychosocial Re-Evaluation    [place] Name 05/29/20 0946             Psychosocial Re-Evaluation   Current issues with Current Stress Concerns;Current Anxiety/Panic;Current Depression       Comments Evana currently is receiving counselling once a week by [facility] and so far has not voiced any increased stressors       Expected Outcomes Shamell will have decreased stressors, improved coping mechanisms upon discharge from phase 2 cardiac rehab       Interventions Encouraged to attend Cardiac Rehabilitation for the exercise       Continue Psychosocial Services  Follow up required by staff       Comments Shelbi voices being depressed over her recent hospitalization times 2         Initial Review   Source of Stress Concerns Chronic Illness              Psychosocial Discharge (Final Psychosocial Re-Evaluation):  Psychosocial Re-Evaluation - 05/29/20 0946      Psychosocial Re-Evaluation   Current issues with Current Stress Concerns;Current Anxiety/Panic;Current Depression    Comments Tychelle currently is receiving counselling once a week by [facility] and so far has not voiced any increased stressors    Expected Outcomes Lashawndra will have decreased stressors, improved coping mechanisms upon discharge from phase 2 cardiac rehab    Interventions Encouraged to attend Cardiac Rehabilitation for the exercise      Continue Psychosocial Services  Follow up required by staff    Comments Alece voices being depressed over her recent hospitalization times 2      Initial Review   Source of Stress Concerns Chronic Illness           Vocational Rehabilitation: Provide vocational rehab assistance to qualifying candidates.   Vocational Rehab Evaluation & Intervention:  Vocational Rehab - 05/23/20 1554      Initial Vocational Rehab Evaluation & Intervention   Assessment shows need for Vocational Rehabilitation No   Jillienne is retired and does not need vocational rehab at this time          Education: Education Goals: Education classes will be provided on a weekly basis, covering required topics. Participant will state understanding/return demonstration of topics presented.  Learning Barriers/Preferences:  Learning Barriers/Preferences - 05/23/20 1533      Learning Barriers/Preferences   Learning Barriers Hearing;Sight    Learning Preferences Written Material;Skilled Demonstration;Individual Instruction           Education Topics: Hypertension, Hypertension Reduction -Define heart disease and high blood pressure. Discus how high blood pressure affects the body and ways to reduce high blood pressure.   Exercise and Your Heart -Discuss why it is important to exercise, the FITT principles of exercise, normal and abnormal responses to exercise, and how to exercise safely.   Angina -Discuss definition of angina, causes of angina, treatment of angina,  and how to decrease risk of having angina.   Cardiac Medications -Review what the following cardiac medications are used for, how they affect the body, and side effects that may occur when taking the medications.  Medications include Aspirin, Beta blockers, calcium channel blockers, ACE Inhibitors, angiotensin receptor blockers, diuretics, digoxin, and antihyperlipidemics.   Congestive Heart Failure -Discuss the definition of CHF, how to live  with CHF, the signs and symptoms of CHF, and how keep track of weight and sodium intake.   Heart Disease and Intimacy -Discus the effect sexual activity has on the heart, how changes occur during intimacy as we age, and safety during sexual activity.   Smoking Cessation / COPD -Discuss different methods to quit smoking, the health benefits of quitting smoking, and the definition of COPD.   Nutrition I: Fats -Discuss the types of cholesterol, what cholesterol does to the heart, and how cholesterol levels can be controlled.   Nutrition II: Labels -Discuss the different components of food labels and how to read food label   Heart Parts/Heart Disease and PAD -Discuss the anatomy of the heart, the pathway of blood circulation through the heart, and these are affected by heart disease.   Stress I: Signs and Symptoms -Discuss the causes of stress, how stress may lead to anxiety and depression, and ways to limit stress.   Stress II: Relaxation -Discuss different types of relaxation techniques to limit stress.   Warning Signs of Stroke / TIA -Discuss definition of a stroke, what the signs and symptoms are of a stroke, and how to identify when someone is having stroke.   Knowledge Questionnaire Score:  Knowledge Questionnaire Score - 05/23/20 1533      Knowledge Questionnaire Score   Pre Score 18/24           Core Components/Risk Factors/Patient Goals at Admission:  Personal Goals and Risk Factors at Admission - 05/24/20 0817      Core Components/Risk Factors/Patient Goals on Admission    Weight Management Weight Maintenance;Yes    Intervention Weight Management: Develop a combined nutrition and exercise program designed to reach desired caloric intake, while maintaining appropriate intake of nutrient and fiber, sodium and fats, and appropriate energy expenditure required for the weight goal.;Weight Management: Provide education and appropriate resources to help participant  work on and attain dietary goals.    Expected Outcomes Short Term: Continue to assess and modify interventions until short term weight is achieved;Weight Maintenance: Understanding of the daily nutrition guidelines, which includes 25-35% calories from fat, 7% or less cal from saturated fats, less than 200mg  cholesterol, less than 1.5gm of sodium, & 5 or more servings of fruits and vegetables daily;Understanding recommendations for meals to include 15-35% energy as protein, 25-35% energy from fat, 35-60% energy from carbohydrates, less than 200mg  of dietary cholesterol, 20-35 gm of total fiber daily;Understanding of distribution of calorie intake throughout the day with the consumption of 4-5 meals/snacks    Hypertension Yes    Intervention Provide education on lifestyle modifcations including regular physical activity/exercise, weight management, moderate sodium restriction and increased consumption of fresh fruit, vegetables, and low fat dairy, alcohol moderation, and smoking cessation.;Monitor prescription use compliance.    Expected Outcomes Short Term: Continued assessment and intervention until BP is < 140/61mm HG in hypertensive participants. < 130/51mm HG in hypertensive participants with diabetes, heart failure or chronic kidney disease.;Long Term: Maintenance of blood pressure at goal levels.    Lipids Yes    Intervention Provide education and support for participant  on nutrition & aerobic/resistive exercise along with prescribed medications to achieve LDL 70mg , HDL >40mg .    Expected Outcomes Short Term: Participant states understanding of desired cholesterol values and is compliant with medications prescribed. Participant is following exercise prescription and nutrition guidelines.;Long Term: Cholesterol controlled with medications as prescribed, with individualized exercise RX and with personalized nutrition plan. Value goals: LDL < 70mg , HDL > 40 mg.    Stress Yes    Intervention Offer  individual and/or small group education and counseling on adjustment to heart disease, stress management and health-related lifestyle change. Teach and support self-help strategies.;Refer participants experiencing significant psychosocial distress to appropriate mental health specialists for further evaluation and treatment. When possible, include family members and significant others in education/counseling sessions.    Expected Outcomes Short Term: Participant demonstrates changes in health-related behavior, relaxation and other stress management skills, ability to obtain effective social support, and compliance with psychotropic medications if prescribed.;Long Term: Emotional wellbeing is indicated by absence of clinically significant psychosocial distress or social isolation.           Core Components/Risk Factors/Patient Goals Review:   Goals and Risk Factor Review    Row Name 05/27/20 0959 05/29/20 0951           Core Components/Risk Factors/Patient Goals Review   Personal Goals Review Weight Management/Obesity;Hypertension;Lipids;Stress Weight Management/Obesity;Hypertension;Lipids;Stress      Review Okema started exercise on 05/27/20 and did well with exercise on her first day Sharada is off to a good start to exercise.      Expected Outcomes Patient will continue to partcipate in phase 2 cardiac rehab for exercise, nutrition and lifestyle modifications Patient will continue to partcipate in phase 2 cardiac rehab for exercise, nutrition and lifestyle modifications             Core Components/Risk Factors/Patient Goals at Discharge (Final Review):   Goals and Risk Factor Review - 05/29/20 0951      Core Components/Risk Factors/Patient Goals Review   Personal Goals Review Weight Management/Obesity;Hypertension;Lipids;Stress    Review Magdelyn is off to a good start to exercise.    Expected Outcomes Patient will continue to partcipate in phase 2 cardiac rehab for exercise, nutrition and  lifestyle modifications           ITP Comments:  ITP Comments    Row Name 05/23/20 1521 05/29/20 0944         ITP Comments Dr. Fransico Him, Medical Director 30 Day ITP Review. Adalaya is off to a good start to exercise at cardiac rehab             Comments: See ITP comments.Barnet Pall, RN,BSN 05/30/2020 7:48 AM

## 2020-05-30 ENCOUNTER — Telehealth (HOSPITAL_COMMUNITY): Payer: Self-pay | Admitting: *Deleted

## 2020-05-30 NOTE — Telephone Encounter (Signed)
Spoke with the patient she will not be attending exercise tomorrow  due to a cough and a low grade temperature. Suzanne Galloway said its sinus related and that she called her primary care provider regarding this.Suzanne Galloway has been vaccinated for COVID 19. Reminded Suzanne Galloway to make sure she is symptom free before returning to exercise next week.Barnet Pall, RN,BSN 05/30/2020 1:42 PM

## 2020-05-31 ENCOUNTER — Telehealth (HOSPITAL_COMMUNITY): Payer: Self-pay | Admitting: *Deleted

## 2020-05-31 ENCOUNTER — Encounter (HOSPITAL_COMMUNITY): Payer: Medicare Other

## 2020-05-31 NOTE — Telephone Encounter (Signed)
Spoke to Suzanne Galloway this morning she is still coughing. I asked Suzanne Galloway to hold off from returning to exercise on Monday. Will follow up with the patient on Tuesday to evaluate progress,Suzanne Piazza, RN,BSN 05/31/2020 9:35 AM

## 2020-05-31 NOTE — Telephone Encounter (Signed)
Patient notified that she will need to get a covid  19 test with results and wait 14 days from start of symptoms fever before returning to group exercise at cardiac rehab. Patient states understanding.Barnet Pall, RN,BSN 05/31/2020 3:02 PM

## 2020-06-03 ENCOUNTER — Encounter (HOSPITAL_COMMUNITY): Payer: Medicare Other

## 2020-06-03 DIAGNOSIS — U071 COVID-19: Secondary | ICD-10-CM | POA: Diagnosis not present

## 2020-06-03 DIAGNOSIS — R0981 Nasal congestion: Secondary | ICD-10-CM | POA: Diagnosis not present

## 2020-06-04 ENCOUNTER — Telehealth (HOSPITAL_COMMUNITY): Payer: Self-pay | Admitting: *Deleted

## 2020-06-04 ENCOUNTER — Telehealth (HOSPITAL_COMMUNITY): Payer: Self-pay

## 2020-06-04 DIAGNOSIS — U071 COVID-19: Secondary | ICD-10-CM | POA: Diagnosis not present

## 2020-06-04 NOTE — Telephone Encounter (Signed)
Pt called and stated that she tested positive for covid and advised pt that she has to quarantine for 2 weeks and she has to have a negative tet result in order to come back to cardiac rehab. Pt understood. Canceled 2 weeks of pt cardiac rehab sessions

## 2020-06-04 NOTE — Telephone Encounter (Signed)
Spoke with the patient she tested positive for Covid 19 on 06/03/20. Will ask the patient to continue to stay out from cardiac rehab. Tamika said that her primary care physician is having her to be retested for COVID 19 on 06/07/20.Barnet Pall, RN,BSN 06/04/2020 12:16 PM

## 2020-06-05 ENCOUNTER — Encounter (HOSPITAL_COMMUNITY): Payer: Medicare Other

## 2020-06-07 ENCOUNTER — Encounter (HOSPITAL_COMMUNITY): Payer: Medicare Other

## 2020-06-07 DIAGNOSIS — U071 COVID-19: Secondary | ICD-10-CM | POA: Diagnosis not present

## 2020-06-10 ENCOUNTER — Encounter (HOSPITAL_COMMUNITY): Payer: Medicare Other

## 2020-06-12 ENCOUNTER — Encounter (HOSPITAL_COMMUNITY): Payer: Medicare Other

## 2020-06-14 ENCOUNTER — Encounter (HOSPITAL_COMMUNITY): Payer: Medicare Other

## 2020-06-14 DIAGNOSIS — U071 COVID-19: Secondary | ICD-10-CM | POA: Diagnosis not present

## 2020-06-17 ENCOUNTER — Other Ambulatory Visit: Payer: Self-pay | Admitting: *Deleted

## 2020-06-17 ENCOUNTER — Encounter (HOSPITAL_COMMUNITY): Payer: Medicare Other

## 2020-06-17 NOTE — Patient Outreach (Signed)
[facility]) Care Management  06/17/2020  Suzanne Galloway 1941/02/07 [id number]  Telephone outreach. Pt left message on my phone while away from office stating she and her family have [place].  No answer. Left a message to return my call.  [name]. Myrtie Neither, MSN, [facility] Gerontological Nurse Practitioner [facility] Care Management [phone]

## 2020-06-18 ENCOUNTER — Ambulatory Visit: Payer: Medicare Other

## 2020-06-18 DIAGNOSIS — U071 COVID-19: Secondary | ICD-10-CM | POA: Diagnosis not present

## 2020-06-18 DIAGNOSIS — J019 Acute sinusitis, unspecified: Secondary | ICD-10-CM | POA: Diagnosis not present

## 2020-06-19 ENCOUNTER — Encounter (HOSPITAL_COMMUNITY): Payer: Medicare Other

## 2020-06-19 NOTE — Telephone Encounter (Signed)
Called and spoke with Blanch Media at [facility] who stated pt did retest and still is +. Adv pt nurse Verdis Frederickson and she stated she will f/u with pt.

## 2020-06-20 ENCOUNTER — Telehealth (HOSPITAL_COMMUNITY): Payer: Self-pay | Admitting: *Deleted

## 2020-06-20 NOTE — Telephone Encounter (Signed)
Patient says that she tested positive for COVID 19 last week and is currently taking an antibiotic. Will Discharge from cardiac rehab as Shuntell's last day of participation was on 05/29/20. Will plan to bring the patient back to cardiac rehab in October to resume exercise. Patient agreeable to the plan.Barnet Pall, RN,BSN 06/20/2020 2:36 PM

## 2020-06-21 ENCOUNTER — Encounter (HOSPITAL_COMMUNITY): Payer: Medicare Other

## 2020-06-21 ENCOUNTER — Other Ambulatory Visit: Payer: Self-pay | Admitting: *Deleted

## 2020-06-21 NOTE — Patient Outreach (Signed)
[facility]) Care Management  06/21/2020  Suzanne Galloway 1941/04/28 [id number]  Telephone outreach.  Suzanne Galloway reports she is still testing positive for COVID. Her household became infected approximately one month ago. She had been vaccinated but her daughter and son-in-law had not and say they will not.   She is feeling better, is improving. She was started on an antibiotic Augmentin recently for her ongoing respiratory sxs. She does become SOB with going up the stairs but this improves with rest.  Advised she may have loose stools with this medication. She has already been instructed and what measures to take to reduce this side effect.  Suzanne Galloway will keep me up to date with any new issues. I will call her in one month.  [name]. Myrtie Neither, MSN, [facility] Gerontological Nurse Practitioner [facility] Care Management [phone]

## 2020-06-26 ENCOUNTER — Encounter (HOSPITAL_COMMUNITY): Payer: Medicare Other

## 2020-06-26 DIAGNOSIS — U071 COVID-19: Secondary | ICD-10-CM | POA: Diagnosis not present

## 2020-06-28 ENCOUNTER — Encounter (HOSPITAL_COMMUNITY): Payer: Medicare Other

## 2020-06-28 DIAGNOSIS — U071 COVID-19: Secondary | ICD-10-CM | POA: Diagnosis not present

## 2020-07-01 ENCOUNTER — Encounter (HOSPITAL_COMMUNITY): Payer: Medicare Other

## 2020-07-01 ENCOUNTER — Encounter: Payer: Self-pay | Admitting: Family Medicine

## 2020-07-01 DIAGNOSIS — Z79899 Other long term (current) drug therapy: Secondary | ICD-10-CM | POA: Diagnosis not present

## 2020-07-01 DIAGNOSIS — E739 Lactose intolerance, unspecified: Secondary | ICD-10-CM | POA: Diagnosis not present

## 2020-07-01 DIAGNOSIS — I10 Essential (primary) hypertension: Secondary | ICD-10-CM | POA: Diagnosis not present

## 2020-07-01 DIAGNOSIS — Z7982 Long term (current) use of aspirin: Secondary | ICD-10-CM | POA: Diagnosis not present

## 2020-07-01 DIAGNOSIS — R918 Other nonspecific abnormal finding of lung field: Secondary | ICD-10-CM | POA: Diagnosis not present

## 2020-07-01 DIAGNOSIS — R0902 Hypoxemia: Secondary | ICD-10-CM | POA: Diagnosis not present

## 2020-07-01 DIAGNOSIS — I493 Ventricular premature depolarization: Secondary | ICD-10-CM | POA: Diagnosis not present

## 2020-07-01 DIAGNOSIS — R079 Chest pain, unspecified: Secondary | ICD-10-CM | POA: Diagnosis not present

## 2020-07-01 DIAGNOSIS — R0602 Shortness of breath: Secondary | ICD-10-CM | POA: Diagnosis not present

## 2020-07-01 DIAGNOSIS — R0789 Other chest pain: Secondary | ICD-10-CM | POA: Diagnosis not present

## 2020-07-02 ENCOUNTER — Telehealth: Payer: Self-pay | Admitting: Diagnostic Neuroimaging

## 2020-07-02 ENCOUNTER — Other Ambulatory Visit: Payer: Self-pay | Admitting: Diagnostic Neuroimaging

## 2020-07-02 DIAGNOSIS — G40109 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, not intractable, without status epilepticus: Secondary | ICD-10-CM

## 2020-07-02 MED ORDER — LACOSAMIDE 50 MG PO TABS: ORAL_TABLET | ORAL | 4 refills | Status: DC

## 2020-07-02 NOTE — Telephone Encounter (Signed)
Meds ordered this encounter  Medications  . lacosamide ([name]) 50 MG TABS tablet    Sig: Take 1 tablet (50 mg total) by mouth 2 (two) times daily.    Dispense:  180 tablet    Refill:  Jarrell, MD 05/07/9411, 9:04 PM Certified in Neurology, Neurophysiology and Neuroimaging  [facility] [address] [place], [place] [address] [phone]

## 2020-07-02 NOTE — Telephone Encounter (Signed)
[facility] called, had to transfeVIMPAT 50 MG TABS tabletr  to [facility] because did not have [name] 50 MG TABS tablet in stock.  [facility] can only transfer once. Need prescription refills sent to [facility] for the patient to pick up medication.

## 2020-07-02 NOTE — Telephone Encounter (Signed)
[employer], spoke with [facility] who stated the patient called in [name] refill late in day, after they could order for her. She was due to run out, so [facility] so [facility] could [facility]. Patient prefers [facility] because they deliver medications. Desiree asked that [name] Rx be sent back to them. I advised we can do that. She verbalized understanding, appreciation.

## 2020-07-03 ENCOUNTER — Encounter (HOSPITAL_COMMUNITY): Payer: Medicare Other

## 2020-07-04 ENCOUNTER — Other Ambulatory Visit: Payer: Self-pay | Admitting: *Deleted

## 2020-07-04 NOTE — Patient Outreach (Signed)
[facility]) Care Management  07/04/2020  NYIA TSAO 13-Jul-1941 [id number]  Incoming call from pt reporting ED visit at [facility] in [place] for CP and sternal pressure.  Cardiac workup was negative. Sxs thought to be anxiety related. Was given Rx alprazolam 0.25 mg to take tid prn.  Also reports some rectal bleeding after a semi-hard stool, bright red blood on toilet tissue and on her briefs. No pooling in the toilet. She has used some topical cream to sooth the rectal area.  Reports swishing sound in her head and neck. Hx of tinnitis and was noted to have some carotid blockage on a previous ultrasound. This has not been followed up on.  Went to chiropractor for an adjustment today which she does on a regular basis but not been able to do because of her COVID infection.  Basil is sick and tired of being sick and tired since she had her heart attacks in May. She voices that she feels like she has just had one major issue after another.  Plan: Advised to monitor for continued rectal bleeding and if continues call primary care tomorrow. Will advise [facility] provider about possible need to do carotid ultrasound ASAP, considering pt's CAD, MI. Her next appt isn't until Oct 7th.  [name]. Myrtie Neither, MSN, GNP-BC Gerontological Nurse Practitioner [facility] Care Management [phone]   I will call Tyrihanna again in one month for follow up and she knows she can call me at any time.

## 2020-07-05 ENCOUNTER — Encounter (HOSPITAL_COMMUNITY): Payer: Medicare Other

## 2020-07-08 ENCOUNTER — Encounter (HOSPITAL_COMMUNITY): Payer: Medicare Other

## 2020-07-08 DIAGNOSIS — F3341 Major depressive disorder, recurrent, in partial remission: Secondary | ICD-10-CM | POA: Diagnosis not present

## 2020-07-08 DIAGNOSIS — F3342 Major depressive disorder, recurrent, in full remission: Secondary | ICD-10-CM | POA: Diagnosis not present

## 2020-07-08 DIAGNOSIS — E782 Mixed hyperlipidemia: Secondary | ICD-10-CM | POA: Diagnosis not present

## 2020-07-08 DIAGNOSIS — E039 Hypothyroidism, unspecified: Secondary | ICD-10-CM | POA: Diagnosis not present

## 2020-07-10 ENCOUNTER — Encounter (HOSPITAL_COMMUNITY): Payer: Medicare Other

## 2020-07-12 ENCOUNTER — Encounter (HOSPITAL_COMMUNITY): Payer: Medicare Other

## 2020-07-15 ENCOUNTER — Encounter (HOSPITAL_COMMUNITY): Payer: Medicare Other

## 2020-07-17 ENCOUNTER — Encounter (HOSPITAL_COMMUNITY): Payer: Medicare Other

## 2020-07-19 ENCOUNTER — Encounter (HOSPITAL_COMMUNITY): Payer: Medicare Other

## 2020-07-24 ENCOUNTER — Telehealth (HOSPITAL_COMMUNITY): Payer: Self-pay | Admitting: *Deleted

## 2020-07-24 ENCOUNTER — Ambulatory Visit: Payer: Medicare Other | Attending: Internal Medicine

## 2020-07-24 DIAGNOSIS — E039 Hypothyroidism, unspecified: Secondary | ICD-10-CM

## 2020-07-24 DIAGNOSIS — I6529 Occlusion and stenosis of unspecified carotid artery: Secondary | ICD-10-CM | POA: Insufficient documentation

## 2020-07-24 LAB — CBC WITH DIFFERENTIAL
Basophils % Auto: 0.8 %
Basophils Abs Auto: 0 10*3/uL (ref 0.0–0.2)
Eosinophils % Auto: 2.2 %
Eosinophils Abs Auto: 0.1 10*3/uL (ref 0.0–0.5)
Hematocrit: 41.3 % (ref 36.0–46.0)
Hemoglobin: 13.9 g/dL (ref 12.0–16.0)
Lymphocytes % Auto: 30.3 %
Lymphocytes Abs Auto: 1.7 10*3/uL (ref 1.0–4.8)
MCH: 30.2 pg (ref 27.0–33.0)
MCHC: 33.8 % (ref 32.0–36.0)
MCV: 89.4 fL (ref 80.0–100.0)
MPV: 8.9 fL (ref 6.8–10.0)
Monocytes % Auto: 8.9 %
Monocytes Abs Auto: 0.5 10*3/uL (ref 0.1–0.8)
Neutrophils % Auto: 57.8 %
Neutrophils Abs Auto: 3.2 10*3/uL (ref 1.8–7.7)
Platelet Count: 239 10*3/uL (ref 130–400)
RDW: 13.6 % (ref 0.0–14.7)
Red Blood Cell Count: 4.62 10*6/uL (ref 4.00–5.20)
White Blood Cell Count: 5.6 10*3/uL (ref 4.5–11.0)

## 2020-07-24 LAB — COMPREHENSIVE METABOLIC PANEL
Alanine Transferase (ALT): 18 U/L (ref 5–54)
Albumin: 3.6 g/dL (ref 3.2–4.6)
Alkaline Phosphatase (ALP): 65 U/L (ref 35–115)
Aspartate Transaminase (AST): 21 U/L (ref 15–43)
Bilirubin Total: 1 mg/dL (ref 0.3–1.3)
Calcium: 8.8 mg/dL (ref 8.6–10.5)
Carbon Dioxide Total: 28 mmol/L (ref 24–32)
Chloride: 104 mmol/L (ref 95–110)
Creatinine Serum: 0.85 mg/dL (ref 0.44–1.27)
E-GFR Creatinine (Female): 66 mL/min/{1.73_m2}
Glucose: 90 mg/dL (ref 70–99)
Potassium: 3.9 mmol/L (ref 3.3–5.0)
Protein: 6.2 g/dL — ABNORMAL LOW (ref 6.3–8.3)
Sodium: 140 mmol/L (ref 135–145)
Urea Nitrogen, Blood (BUN): 15 mg/dL (ref 8–22)

## 2020-07-24 LAB — LIPID PANEL
Cholesterol: 182 mg/dL (ref 0–200)
HDL Cholesterol: 58 mg/dL (ref >=35)
LDL Cholesterol Calculation: 105 mg/dL (ref <130)
Non-HDL Cholesterol: 124 mg/dL (ref <=150)
Total Cholesterol: HDL Ratio: 3.1 (ref <4.0)
Triglyceride: 95 mg/dL (ref 35–160)

## 2020-07-24 LAB — THYROID STIMULATING HORMONE: Thyroid Stimulating Hormone: 0.15 u[IU]/mL — ABNORMAL LOW (ref 0.35–3.30)

## 2020-07-24 NOTE — Telephone Encounter (Signed)
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 Arbie Cookey does not restart cardiac rehab until October 22nd.Barnet Pall, RN,BSN 07/24/2020 12:21 PM

## 2020-07-25 DIAGNOSIS — I255 Ischemic cardiomyopathy: Secondary | ICD-10-CM | POA: Diagnosis not present

## 2020-07-25 DIAGNOSIS — Z955 Presence of coronary angioplasty implant and graft: Secondary | ICD-10-CM | POA: Diagnosis not present

## 2020-07-25 DIAGNOSIS — I2510 Atherosclerotic heart disease of native coronary artery without angina pectoris: Secondary | ICD-10-CM | POA: Diagnosis not present

## 2020-07-26 ENCOUNTER — Other Ambulatory Visit: Payer: Self-pay | Admitting: *Deleted

## 2020-07-26 ENCOUNTER — Other Ambulatory Visit: Payer: Self-pay

## 2020-07-26 NOTE — Patient Outreach (Signed)
[facility]) Care Management  07/26/2020  NYREE APPLEGATE 09-09-1941 [id number]   Telephone outreach follow up [place].  Kymberlie and family have recovered for their COVID infections. Jaquayla has had a repeat Chest XRAY and will have a follow up of this as they noticed a nodule that may have just been residual from [place].  She has seen her cardiologist and she is progressing well and he advises her to resume Cardiac Rehab since she has recovered from [place].  She will have both her flu vaccine and COVID booster when she goes to have her Chest XRay.  She is feeling much better emotionally and is feeling more like the cheerful, humerus gal we know.  I will talk to her again in one month. She knows to call me for any problems or questions.  [name]. Myrtie Neither, MSN, [facility] Gerontological Nurse Practitioner [facility] Care Management [phone]

## 2020-07-30 ENCOUNTER — Encounter: Payer: Self-pay | Admitting: Internal Medicine

## 2020-07-30 ENCOUNTER — Ambulatory Visit: Payer: Medicare Other | Admitting: Internal Medicine

## 2020-07-30 VITALS — BP 124/62 | HR 64 | Temp 97.3°F | Resp 18 | Ht 66.0 in | Wt 169.0 lb

## 2020-07-30 DIAGNOSIS — E039 Hypothyroidism, unspecified: Secondary | ICD-10-CM

## 2020-07-30 DIAGNOSIS — Z23 Encounter for immunization: Secondary | ICD-10-CM

## 2020-07-30 DIAGNOSIS — E785 Hyperlipidemia, unspecified: Secondary | ICD-10-CM

## 2020-07-30 MED ORDER — ATORVASTATIN 40 MG TABLET
40.0000 mg | ORAL_TABLET | Freq: Every day | ORAL | 3 refills | Status: DC
Start: 2020-07-30 — End: 2021-07-29

## 2020-07-30 MED ORDER — CLOPIDOGREL 75 MG TABLET
75.0000 mg | ORAL_TABLET | Freq: Every day | ORAL | 3 refills | Status: DC
Start: 2020-07-30 — End: 2021-07-29

## 2020-07-30 NOTE — Patient Instructions (Signed)
   Repeat thyroid blood test on Friday

## 2020-07-30 NOTE — Progress Notes (Signed)
Chief Complaint: Patient presents today with   Follow-up on the cholesterol    Patient WAS wearing a surgical mask   Contact and Droplet precautions were followed when caring for the patient.   PPE used by provider during encounter: Surgical mask,face shield and gloves used     [name] If you are reviewing this progress note and have questions about the meaning or medical terms being used, please schedule an appointment or bring it up at your next follow-up appointment. Medical notes are meant to be a communication tool between medical professionals and require medical terms to be used for efficiency.      - Voice recognition software may have been used in portions of this report.   Inadvertent computerized transcription errors may be present despite efforts.      Hyperlipidemia,    management discussed with patient, shehas  been working on  diet, she has  been doing regular exercise. Patient verbalised understanding about the significance for regular exercise and low fat diet in management of this problem    Lab Results   Lab Name Value Date/Time    CHOL 182 07/24/2020 07:22 AM    CHOL 188 05/09/2014 09:44 AM    LDLC 105 07/24/2020 07:22 AM    LDLC 107 05/09/2014 09:44 AM    HDL 58 07/24/2020 07:22 AM    HDL 59 05/09/2014 09:44 AM    TRIG 95 07/24/2020 07:22 AM    TRIG 108 05/09/2014 09:44 AM   She has been having ongoing dental issues had 3 teeth pulled and was on antibiotics for almost 2 months  Her left knee pain improved after she got the cortisone sho    No symptoms of hypo or hyperthyroidism: no decreased or increased weight, no feeling cold/chilly or excessively warm, no diarrhea or constipation, no undue sweatiness, anxiety or palpitations.   Lab Results   Lab Name Value Date/Time    TSH 0.15 (L) 07/24/2020 07:22 AM    TSH 1.89 05/09/2014 09:44 AM    FT4 1.27 06/17/2009 08:16 AM        Ros  complete review of symptons was done all other systems are negative except as otherwise stated in HPI      Vs  BP  124/62 (SITE: right arm, Orthostatic Position: sitting, Cuff Size: regular)   Pulse 64   Temp 36.3 C (97.3 F) (Temporal)   Resp 18   Ht 1.676 m (5\' 6" )   Wt 76.7 kg (169 lb)   SpO2 98%   BMI 27.28 kg/m     Physical exam -  General Appearance: healthy, alert, no distress, pleasant affect, cooperative.  Eyes:  conjunctivae and corneas clear. PERRL, EOM's intact. sclerae normal.  Ears:  external inspection of ears show no abnormality.  Lungs: no chest deformities noted .lungs clear to auscultation.  Heart:  normal rate and regular rhythm, no murmurs,  Extremities:  no cyanosis, clubbing, or edema.    ASSESMENT AND PLAN     (E78.5) Dyslipidemia  (primary encounter diagnosis)  Comment: Cholesterol is well controlled  Plan: Atorvastatin (LIPITOR) 40 mg tablet            (E03.9) Acquired hypothyroidism  Comment: TSH is suppressed, she is asymptomatic  will repeat TSH  This Friday if still low will adjust her levothyroxine  Plan: Thyroid Stimulating Hormone               I did review patient's past medical and family/social history, no changes  noted.  Barriers to Learning assessed: none. Patient verbalizes understanding of teaching and instructions.  Harvie Heck MD      Electronically Signed By:  Harvie Heck, MD  Physician associate   [place]  [phone]

## 2020-07-30 NOTE — Nursing Note (Signed)
Vital signs have been taken, allergies verified, screened for pain. Chief Complaint noted, preferred pharmacy verified.    Micai Apolinar, MA

## 2020-08-02 ENCOUNTER — Ambulatory Visit: Payer: Medicare Other | Attending: Internal Medicine

## 2020-08-02 DIAGNOSIS — E039 Hypothyroidism, unspecified: Secondary | ICD-10-CM | POA: Insufficient documentation

## 2020-08-02 LAB — THYROID STIMULATING HORMONE: Thyroid Stimulating Hormone: 0.42 u[IU]/mL (ref 0.35–3.30)

## 2020-08-06 ENCOUNTER — Telehealth (HOSPITAL_COMMUNITY): Payer: Self-pay

## 2020-08-06 NOTE — Telephone Encounter (Signed)
Pt called and wanted to know what time her cardiac rehab appts are and I advised pt 9:00am starting on 08/12/2020. Pt also stated that she wouldn't be able to come on 08/16/2020 because she had a doctor appt. Advised pt that I canceled that appt.

## 2020-08-07 ENCOUNTER — Ambulatory Visit
Admission: RE | Admit: 2020-08-07 | Discharge: 2020-08-07 | Disposition: A | Discharge status: Home / Self Care | Payer: Medicare Other | Source: Ambulatory Visit | Attending: Internal Medicine | Admitting: Internal Medicine

## 2020-08-07 ENCOUNTER — Other Ambulatory Visit: Payer: Self-pay | Admitting: Internal Medicine

## 2020-08-07 ENCOUNTER — Other Ambulatory Visit: Payer: Self-pay

## 2020-08-07 DIAGNOSIS — R9389 Abnormal findings on diagnostic imaging of other specified body structures: Secondary | ICD-10-CM

## 2020-08-07 DIAGNOSIS — Z9049 Acquired absence of other specified parts of digestive tract: Secondary | ICD-10-CM | POA: Diagnosis not present

## 2020-08-07 DIAGNOSIS — R918 Other nonspecific abnormal finding of lung field: Secondary | ICD-10-CM | POA: Diagnosis not present

## 2020-08-07 DIAGNOSIS — Z955 Presence of coronary angioplasty implant and graft: Secondary | ICD-10-CM | POA: Diagnosis not present

## 2020-08-08 ENCOUNTER — Ambulatory Visit
Admission: RE | Admit: 2020-08-08 | Discharge: 2020-08-08 | Disposition: A | Discharge status: Home / Self Care | Payer: Medicare Other | Source: Ambulatory Visit | Attending: Internal Medicine | Admitting: Internal Medicine

## 2020-08-08 ENCOUNTER — Telehealth: Payer: Self-pay | Admitting: Internal Medicine

## 2020-08-08 DIAGNOSIS — Z1231 Encounter for screening mammogram for malignant neoplasm of breast: Secondary | ICD-10-CM

## 2020-08-08 NOTE — Telephone Encounter (Signed)
Everything looks good.  If there are questions she may need to make fu with Dr Parks Neptune.

## 2020-08-08 NOTE — Telephone Encounter (Signed)
Results Inquiry:    Patient calling for the results of lab test done at [facility] 08/02/2020. The patient would like a phone call back.    [Notify that results for tests done at [facility] will be auto released on MyChart once they are finalized - lab and X-ray results are released after 1-2 days; surgical pathology results after 14 days. Tests done outside of [facility] will not be available in MyChart as they are not entered into our EMR but scanned in as documents]

## 2020-08-08 NOTE — Telephone Encounter (Signed)
Patient [id number] was given message and verbalized understanding.

## 2020-08-09 ENCOUNTER — Telehealth (HOSPITAL_COMMUNITY): Payer: Self-pay | Admitting: *Deleted

## 2020-08-09 NOTE — Telephone Encounter (Signed)
Spoke with there patient she plans to return to exercise on Monday.Barnet Pall, RN,BSN 08/09/2020 3:34 PM

## 2020-08-12 ENCOUNTER — Encounter (HOSPITAL_COMMUNITY)
Admission: RE | Admit: 2020-08-12 | Discharge: 2020-08-12 | Disposition: A | Discharge status: Home / Self Care | Payer: Medicare Other | Source: Ambulatory Visit | Attending: Cardiology | Admitting: Cardiology

## 2020-08-12 ENCOUNTER — Other Ambulatory Visit: Payer: Self-pay

## 2020-08-12 ENCOUNTER — Ambulatory Visit (HOSPITAL_COMMUNITY): Payer: Medicare Other

## 2020-08-12 DIAGNOSIS — Z955 Presence of coronary angioplasty implant and graft: Secondary | ICD-10-CM | POA: Insufficient documentation

## 2020-08-12 NOTE — Progress Notes (Signed)
Cardiac Individual Treatment Plan  Patient Details  Name: Suzanne Galloway MRN: [id number] Date of Birth: 09-29-1941 Referring Provider:     CARDIAC REHAB PHASE II ORIENTATION from 05/23/2020 in [place]  Referring Provider Dr Herbie [place] Preli-Dr Fransico Him MD, Covering      Initial Encounter Date:    CARDIAC REHAB PHASE II ORIENTATION from 05/23/2020 in [facility]  Date 05/23/20      Visit Diagnosis: 03/10/20 STEMI, DES LAD   Patient's Home Medications on Admission:  Current Outpatient Medications:  .  Ascorbic Acid (VITAMIN C PO), Take 1 tablet by mouth daily., Disp: , Rfl:  .  aspirin 81 MG chewable tablet, Chew 81 mg by mouth in the morning and at bedtime. , Disp: , Rfl:  .  atorvastatin (LIPITOR) 80 MG tablet, Take 80 mg by mouth daily., Disp: , Rfl:  .  Calcium Carbonate-Vitamin D (CALCIUM 600+D) 600-400 MG-UNIT tablet, Take 1 tablet by mouth daily., Disp: , Rfl:  .  escitalopram (LEXAPRO) 20 MG tablet, Take 20 mg by mouth daily., Disp: , Rfl: 11 .  famotidine (PEPCID) 40 MG tablet, Take 40 mg by mouth 2 (two) times daily. , Disp: , Rfl:  .  isosorbide mononitrate (IMDUR) 30 MG 24 hr tablet, Take 30 mg by mouth daily., Disp: , Rfl:  .  lacosamide ([name]) 50 MG TABS tablet, Take 1 tablet (50 mg total) by mouth 2 (two) times daily., Disp: 180 tablet, Rfl: 4 .  levothyroxine (SYNTHROID) 75 MCG tablet, Take 75 mcg by mouth every morning. , Disp: , Rfl:  .  loperamide (IMODIUM A-D) 2 MG tablet, Take 2 mg by mouth 4 (four) times daily as needed for diarrhea or loose stools., Disp: , Rfl:  .  metoprolol succinate (TOPROL-XL) 25 MG 24 hr tablet, Take 25 mg by mouth daily., Disp: , Rfl:  .  Multiple Vitamins-Minerals (PRESERVISION AREDS PO), Take 1 tablet by mouth daily. , Disp: , Rfl:  .  nitroGLYCERIN (NITROSTAT) 0.4 MG SL tablet, Place 0.4 mg under the tongue every 5 (five) minutes as needed for chest pain., Disp: , Rfl:  .   Polyethyl Glycol-Propyl Glycol (SYSTANE OP), Apply 1 drop to eye daily as needed., Disp: , Rfl:  .  sacubitril-valsartan (ENTRESTO) 24-26 MG, Take 1 tablet by mouth 2 (two) times daily. , Disp: , Rfl:  .  spironolactone (ALDACTONE) 25 MG tablet, Take 25 mg by mouth daily., Disp: , Rfl:  .  sucralfate (CARAFATE) 1 g tablet, Take 1 g by mouth 4 (four) times daily -  with meals and at bedtime., Disp: , Rfl:  .  ticagrelor (BRILINTA) 90 MG TABS tablet, Take 90 mg by mouth 2 (two) times daily. , Disp: , Rfl:  .  zonisamide (ZONEGRAN) 100 MG capsule, Take 2 capsules (200 mg total) by mouth at bedtime., Disp: 180 capsule, Rfl: 4  Past Medical History: Past Medical History:  Diagnosis Date  . Anxiety   . Arthritis    "hands, back, toes" (01/01/2015)  . Asthma   . Chronic bronchitis ([place])    "get it just about q yr" (01/01/2015)  . Coronary artery disease    S/P STEMI 03/10/20, DES LAD, VFib Arrest  . Depression   . GERD (gastroesophageal reflux disease)   . Hearing loss    hearing aids  . High cholesterol   . Hyperlipemia   . Hypertension   . Hypothyroidism   . IBS (irritable bowel syndrome)   .  Mitral valve prolapse   . Seizures [facility])    dx Aug 2018, most recent seizure 10/13/17, partial complex  . Urinary incontinence     Tobacco Use: Social History   Tobacco Use  Smoking Status Never Smoker  Smokeless Tobacco Never Used    Labs: Recent Review Flowsheet Data    Labs for ITP Cardiac and Pulmonary Rehab Latest Ref Rng & Units 12/27/2014 01/02/2015 01/03/2015   Cholestrol 0 - 200 mg/dL 182 174 -   LDLCALC 0 - 99 mg/dL 96 105(H) -   HDL >39 mg/dL 38(L) 37(L) -   Trlycerides <150 mg/dL 241(H) 158(H) -   Hemoglobin A1c 4.8 - 5.6 % - - 6.3(H)      Capillary Blood Glucose: Lab Results  Component Value Date   GLUCAP 106 (H) 02/20/2017   GLUCAP 101 (H) 02/20/2017   GLUCAP 106 (H) 02/19/2017   GLUCAP 112 (H) 02/19/2017   GLUCAP 171 (H) 01/06/2015     Exercise Target  Goals: Exercise Program Goal: Individual exercise prescription set using results from initial 6 min walk test and THRR while considering  patient's activity barriers and safety.   Exercise Prescription Goal: Starting with aerobic activity 30 plus minutes a day, 3 days per week for initial exercise prescription. Provide home exercise prescription and guidelines that participant acknowledges understanding prior to discharge.  Activity Barriers & Risk Stratification:  Activity Barriers & Cardiac Risk Stratification - 05/23/20 1522      Activity Barriers & Cardiac Risk Stratification   Activity Barriers Back Problems    Cardiac Risk Stratification High           6 Minute Walk:  6 Minute Walk    Row Name 05/23/20 1521         6 Minute Walk   Phase Initial     Distance 1520 feet     Walk Time 6 minutes     # of Rest Breaks 0     MPH 2.9     METS 3.1     RPE 12     Perceived Dyspnea  0     VO2 Peak 10.7     Symptoms No     Resting HR 67 bpm     Resting BP 118/62     Resting Oxygen Saturation  99 %     Exercise Oxygen Saturation  during 6 min walk 98 %     Max Ex. HR 102 bpm     Max Ex. BP 140/60     2 Minute Post BP 120/70            Oxygen Initial Assessment:   Oxygen Re-Evaluation:   Oxygen Discharge (Final Oxygen Re-Evaluation):   Initial Exercise Prescription:  Initial Exercise Prescription - 05/23/20 1500      Date of Initial Exercise RX and Referring Provider   Date 05/23/20    Referring Provider Dr Herbie [place] Preli-Dr Fransico Him MD, Covering    Expected Discharge Date 07/19/20      Bike   Level 1.5    Watts 20    Minutes 15    METs 2.7      NuStep   Level 2    SPM 75    Minutes 15    METs 2.5      Prescription Details   Frequency (times per week) 3x    Duration Progress to 10 minutes continuous walking  at current work load and total walking time to 30-45 min  Intensity   THRR 40-80% of Max Heartrate 57-114    Ratings of Perceived  Exertion 11-13    Perceived Dyspnea 0-4      Progression   Progression Continue progressive overload as per policy without signs/symptoms or physical distress.      Resistance Training   Training Prescription Yes    Weight 3lbs    Reps 10-15           Perform Capillary Blood Glucose checks as needed.  Exercise Prescription Changes:   Exercise Prescription Changes    Row Name 05/27/20 1000 08/12/20 1000           Response to Exercise   Blood Pressure (Admit) 100/60 130/70      Blood Pressure (Exercise) 110/64 124/60      Blood Pressure (Exit) 110/70 114/74      Heart Rate (Admit) 82 bpm 87 bpm      Heart Rate (Exercise) 91 bpm 99 bpm      Heart Rate (Exit) 77 bpm 80 bpm      Rating of Perceived Exertion (Exercise) 12 12      Perceived Dyspnea (Exercise) 0 0      Symptoms None None      Comments Pt's first day of exercise Pt first day back       Duration Progress to 30 minutes of  aerobic without signs/symptoms of physical distress Progress to 30 minutes of  aerobic without signs/symptoms of physical distress      Intensity THRR unchanged THRR unchanged        Progression   Progression Continue to progress workloads to maintain intensity without signs/symptoms of physical distress. Continue to progress workloads to maintain intensity without signs/symptoms of physical distress.      Average METs 2.8 2.4        Resistance Training   Training Prescription Yes Yes      Weight 3lbs 3lbs      Reps 10-15 10-15      Time 10 Minutes 10 Minutes        Interval Training   Interval Training No No        Treadmill   MPH -- 1.7      Grade -- 1      Minutes -- 15      METs -- 2.54        Bike   Level 1.5 0      Watts 25 --      Minutes 15 --      METs 3.1 --        NuStep   Level 2 2      SPM 75 7      Minutes 15 15      METs 2.5 2.3             Exercise Comments:   Exercise Comments    Row Name 05/27/20 1021 08/12/20 1036         Exercise Comments  Pt's first day of exercise. Pt responded well to workloads and exericse equipment. Will continue to monitor and progress as tolerated. Pt's first day back in Cardiac Rehab since early August. Pt responded well to exercise prescription. Will continue to monitor and progress as tolerated.             Exercise Goals and Review:   Exercise Goals    Row Name 05/23/20 1522             Exercise Goals  Increase Physical Activity Yes       Intervention Provide advice, education, support and counseling about physical activity/exercise needs.;Develop an individualized exercise prescription for aerobic and resistive training based on initial evaluation findings, risk stratification, comorbidities and participant's personal goals.       Expected Outcomes Short Term: Attend rehab on a regular basis to increase amount of physical activity.;Long Term: Add in home exercise to make exercise part of routine and to increase amount of physical activity.;Long Term: Exercising regularly at least 3-5 days a week.       Increase Strength and Stamina Yes       Intervention Provide advice, education, support and counseling about physical activity/exercise needs.;Develop an individualized exercise prescription for aerobic and resistive training based on initial evaluation findings, risk stratification, comorbidities and participant's personal goals.       Expected Outcomes Short Term: Increase workloads from initial exercise prescription for resistance, speed, and METs.;Short Term: Perform resistance training exercises routinely during rehab and add in resistance training at home;Long Term: Improve cardiorespiratory fitness, muscular endurance and strength as measured by increased METs and functional capacity (6MWT)       Able to understand and use rate of perceived exertion (RPE) scale Yes       Intervention Provide education and explanation on how to use RPE scale       Expected Outcomes Short Term: Able to use RPE  daily in rehab to express subjective intensity level;Long Term:  Able to use RPE to guide intensity level when exercising independently       Knowledge and understanding of Target Heart Rate Range (THRR) Yes       Intervention Provide education and explanation of THRR including how the numbers were predicted and where they are located for reference       Expected Outcomes Short Term: Able to state/look up THRR;Long Term: Able to use THRR to govern intensity when exercising independently;Short Term: Able to use daily as guideline for intensity in rehab       Able to check pulse independently Yes       Intervention Provide education and demonstration on how to check pulse in carotid and radial arteries.;Review the importance of being able to check your own pulse for safety during independent exercise       Expected Outcomes Short Term: Able to explain why pulse checking is important during independent exercise;Long Term: Able to check pulse independently and accurately       Understanding of Exercise Prescription Yes       Intervention Provide education, explanation, and written materials on patient's individual exercise prescription       Expected Outcomes Short Term: Able to explain program exercise prescription;Long Term: Able to explain home exercise prescription to exercise independently              Exercise Goals Re-Evaluation :  Exercise Goals Re-Evaluation    Row Name 05/27/20 1022 08/12/20 1037           Exercise Goal Re-Evaluation   Exercise Goals Review Able to understand and use rate of perceived exertion (RPE) scale;Knowledge and understanding of Target Heart Rate Range (THRR);Understanding of Exercise Prescription Increase Physical Activity;Increase Strength and Stamina;Able to understand and use rate of perceived exertion (RPE) scale;Knowledge and understanding of Target Heart Rate Range (THRR);Able to check pulse independently;Understanding of Exercise Prescription       Comments Pt's first day of exercise. Pt oriented to exercise equipment and prescription. Pt able to exercise 30  minutes with minimal difficulty. Pt responded well to exercise. Pt is able to exercise for 30 minutes with no difficulty.      Expected Outcomes Pt will continue to increase cardiovascular fitness. Will continue to monitor. Pt will increase strength and stamina. Pt will also work to increase comfort level on treadmill.              Discharge Exercise Prescription (Final Exercise Prescription Changes):  Exercise Prescription Changes - 08/12/20 1000      Response to Exercise   Blood Pressure (Admit) 130/70    Blood Pressure (Exercise) 124/60    Blood Pressure (Exit) 114/74    Heart Rate (Admit) 87 bpm    Heart Rate (Exercise) 99 bpm    Heart Rate (Exit) 80 bpm    Rating of Perceived Exertion (Exercise) 12    Perceived Dyspnea (Exercise) 0    Symptoms None    Comments Pt first day back     Duration Progress to 30 minutes of  aerobic without signs/symptoms of physical distress    Intensity THRR unchanged      Progression   Progression Continue to progress workloads to maintain intensity without signs/symptoms of physical distress.    Average METs 2.4      Resistance Training   Training Prescription Yes    Weight 3lbs    Reps 10-15    Time 10 Minutes      Interval Training   Interval Training No      Treadmill   MPH 1.7    Grade 1    Minutes 15    METs 2.54      Bike   Level 0      NuStep   Level 2    SPM 7    Minutes 15    METs 2.3           Nutrition:  Target Goals: Understanding of nutrition guidelines, daily intake of sodium 1500mg , cholesterol 200mg , calories 30% from fat and 7% or less from saturated fats, daily to have 5 or more servings of fruits and vegetables.  Biometrics:  Pre Biometrics - 05/23/20 1523      Pre Biometrics   Height 5' 2.5" (1.588 m)    Weight 58.9 kg    Waist Circumference 34.5 inches    Hip Circumference 38 inches     Waist to Hip Ratio 0.91 %    BMI (Calculated) 23.36    Triceps Skinfold 11 mm    % Body Fat 32.7 %    Grip Strength 25 kg    Flexibility 13 in    Single Leg Stand 20.43 seconds            Nutrition Therapy Plan and Nutrition Goals:  Nutrition Therapy & Goals - 08/14/20 1012      Nutrition Therapy   Diet Heart Healthy    Drug/Food Interactions Statins/Certain Fruits      Personal Nutrition Goals   Nutrition Goal Pt to identify food quantities necessary to maintain weight between 125-130 lbs    Personal Goal #2 Pt to build a healthy plate including vegetables, fruits, whole grains, and low-fat dairy products in a heart healthy meal plan.      Intervention Plan   Intervention Prescribe, educate and counsel regarding individualized specific dietary modifications aiming towards targeted core components such as weight, hypertension, lipid management, diabetes, heart failure and other comorbidities.    Expected Outcomes Short Term Goal: Understand basic principles of dietary content, such as  calories, fat, sodium, cholesterol and nutrients.           Nutrition Assessments:  Nutrition Assessments - 05/29/20 0956      MEDFICTS Scores   Pre Score 15           Nutrition Goals Re-Evaluation:  Nutrition Goals Re-Evaluation    [place] Name 05/29/20 0956 08/14/20 1012           Goals   Current Weight 129 lb (58.5 kg) 129 lb (58.5 kg)      Nutrition Goal Pt to identify food quantities necessary to maintain weight between 125-130 lbs --        Personal Goal #2 Re-Evaluation   Personal Goal #2 Pt to build a healthy plate including vegetables, fruits, whole grains, and low-fat dairy products in a heart healthy meal plan. --             Nutrition Goals Discharge (Final Nutrition Goals Re-Evaluation):  Nutrition Goals Re-Evaluation - 08/14/20 1012      Goals   Current Weight 129 lb (58.5 kg)           Psychosocial: Target Goals: Acknowledge presence or absence of  significant depression and/or stress, maximize coping skills, provide positive support system. Participant is able to verbalize types and ability to use techniques and skills needed for reducing stress and depression.  Initial Review & Psychosocial Screening:  Initial Psych Review & Screening - 05/23/20 1551      Initial Review   Current issues with Current Depression;History of Depression;Current Anxiety/Panic;Current Stress Concerns    Source of Stress Concerns Chronic Illness    Comments Yuliet voices being depressed over her recent hospitalization times 2      [place]? Yes   Trenda lives with her daughter and has a total of 3 children for support     Barriers   Psychosocial barriers to participate in program The patient should benefit from training in stress management and relaxation.      Screening Interventions   Interventions Encouraged to exercise;Provide feedback about the scores to participant;To provide support and resources with identified psychosocial needs    Expected Outcomes Short Term goal: Utilizing psychosocial counselor, staff and physician to assist with identification of specific Stressors or current issues interfering with healing process. Setting desired goal for each stressor or current issue identified.;Long Term Goal: Stressors or current issues are controlled or eliminated.;Short Term goal: Identification and review with participant of any Quality of Life or Depression concerns found by scoring the questionnaire.;Long Term goal: The participant improves quality of Life and PHQ9 Scores as seen by post scores and/or verbalization of changes           Quality of Life Scores:  Quality of Life - 05/23/20 1533      Quality of Life   Select Quality of Life      Quality of Life Scores   Health/Function Pre 26.18 %    Socioeconomic Pre 26.17 %    Psych/Spiritual Pre 27.93 %    Family Pre 30 %    GLOBAL Pre 27.06 %          Scores  of 19 and below usually indicate a poorer quality of life in these areas.  A difference of  2-3 points is a clinically meaningful difference.  A difference of 2-3 points in the total score of the Quality of Life Index has been associated with significant improvement in overall quality of life, self-image,  physical symptoms, and general health in studies assessing change in quality of life.  PHQ-9: Recent Review Flowsheet Data    Depression screen [facility] 2/9 05/27/2020 05/23/2020 04/06/2020 06/11/2017   Decreased Interest 0 0 1 1   Down, Depressed, Hopeless 1 1 1 1    PHQ - 2 Score 1 1 2 2    Altered sleeping - - 0 0   Tired, decreased energy - - 1 2   Change in appetite - - 0 0   Feeling bad or failure about yourself  - - 0 0   Trouble concentrating - - 1 1   Moving slowly or fidgety/restless - - 0 0   Suicidal thoughts - - 0 0   PHQ-9 Score - - 4 5   Difficult doing work/chores - - Not difficult at all Not difficult at all     Interpretation of Total Score  Total Score Depression Severity:  1-4 = Minimal depression, 5-9 = Mild depression, 10-14 = Moderate depression, 15-19 = Moderately severe depression, 20-27 = Severe depression   Psychosocial Evaluation and Intervention:   Psychosocial Re-Evaluation:  Psychosocial Re-Evaluation    Row Name 05/29/20 0946 08/12/20 1225           Psychosocial Re-Evaluation   Current issues with Current Stress Concerns;Current Anxiety/Panic;Current Depression Current Stress Concerns;Current Anxiety/Panic;Current Depression      Comments Damia currently is receiving counselling once a week by [facility] and so far has not voiced any increased stressors Kinleigh returned to exercise on 08/12/20 and did not voice any increased stressors      Expected Outcomes Kimberley will have decreased stressors, improved coping mechanisms upon discharge from phase 2 cardiac rehab Takara will have decreased stressors, improved coping mechanisms upon discharge from phase 2  cardiac rehab      Interventions Encouraged to attend Cardiac Rehabilitation for the exercise Encouraged to attend Cardiac Rehabilitation for the exercise      Continue Psychosocial Services  Follow up required by staff Follow up required by staff      Comments Butch Penny voices being depressed over her recent hospitalization times 2 Keniah returned to exercise after recently having COVID 19        Initial Review   Source of Stress Concerns Chronic Illness Chronic Illness             Psychosocial Discharge (Final Psychosocial Re-Evaluation):  Psychosocial Re-Evaluation - 08/12/20 1225      Psychosocial Re-Evaluation   Current issues with Current Stress Concerns;Current Anxiety/Panic;Current Depression    Comments Kasiya returned to exercise on 08/12/20 and did not voice any increased stressors    Expected Outcomes Chakara will have decreased stressors, improved coping mechanisms upon discharge from phase 2 cardiac rehab    Interventions Encouraged to attend Cardiac Rehabilitation for the exercise    Continue Psychosocial Services  Follow up required by staff    Comments Donyelle returned to exercise after recently having COVID 19      Initial Review   Source of Stress Concerns Chronic Illness           Vocational Rehabilitation: Provide vocational rehab assistance to qualifying candidates.   Vocational Rehab Evaluation & Intervention:  Vocational Rehab - 05/23/20 1554      Initial Vocational Rehab Evaluation & Intervention   Assessment shows need for Vocational Rehabilitation No   Collette is retired and does not need vocational rehab at this time          Education: Education Goals: Education classes  will be provided on a weekly basis, covering required topics. Participant will state understanding/return demonstration of topics presented.  Learning Barriers/Preferences:  Learning Barriers/Preferences - 05/23/20 1533      Learning Barriers/Preferences   Learning Barriers  Hearing;Sight    Learning Preferences Written Material;Skilled Demonstration;Individual Instruction           Education Topics: Hypertension, Hypertension Reduction -Define heart disease and high blood pressure. Discus how high blood pressure affects the body and ways to reduce high blood pressure.   Exercise and Your Heart -Discuss why it is important to exercise, the FITT principles of exercise, normal and abnormal responses to exercise, and how to exercise safely.   Angina -Discuss definition of angina, causes of angina, treatment of angina, and how to decrease risk of having angina.   Cardiac Medications -Review what the following cardiac medications are used for, how they affect the body, and side effects that may occur when taking the medications.  Medications include Aspirin, Beta blockers, calcium channel blockers, ACE Inhibitors, angiotensin receptor blockers, diuretics, digoxin, and antihyperlipidemics.   Congestive Heart Failure -Discuss the definition of CHF, how to live with CHF, the signs and symptoms of CHF, and how keep track of weight and sodium intake.   Heart Disease and Intimacy -Discus the effect sexual activity has on the heart, how changes occur during intimacy as we age, and safety during sexual activity.   Smoking Cessation / COPD -Discuss different methods to quit smoking, the health benefits of quitting smoking, and the definition of COPD.   Nutrition I: Fats -Discuss the types of cholesterol, what cholesterol does to the heart, and how cholesterol levels can be controlled.   Nutrition II: Labels -Discuss the different components of food labels and how to read food label   Heart Parts/Heart Disease and PAD -Discuss the anatomy of the heart, the pathway of blood circulation through the heart, and these are affected by heart disease.   Stress I: Signs and Symptoms -Discuss the causes of stress, how stress may lead to anxiety and depression,  and ways to limit stress.   Stress II: Relaxation -Discuss different types of relaxation techniques to limit stress.   Warning Signs of Stroke / TIA -Discuss definition of a stroke, what the signs and symptoms are of a stroke, and how to identify when someone is having stroke.   Knowledge Questionnaire Score:  Knowledge Questionnaire Score - 05/23/20 1533      Knowledge Questionnaire Score   Pre Score 18/24           Core Components/Risk Factors/Patient Goals at Admission:  Personal Goals and Risk Factors at Admission - 05/24/20 0817      Core Components/Risk Factors/Patient Goals on Admission    Weight Management Weight Maintenance;Yes    Intervention Weight Management: Develop a combined nutrition and exercise program designed to reach desired caloric intake, while maintaining appropriate intake of nutrient and fiber, sodium and fats, and appropriate energy expenditure required for the weight goal.;Weight Management: Provide education and appropriate resources to help participant work on and attain dietary goals.    Expected Outcomes Short Term: Continue to assess and modify interventions until short term weight is achieved;Weight Maintenance: Understanding of the daily nutrition guidelines, which includes 25-35% calories from fat, 7% or less cal from saturated fats, less than 200mg  cholesterol, less than 1.5gm of sodium, & 5 or more servings of fruits and vegetables daily;Understanding recommendations for meals to include 15-35% energy as protein, 25-35% energy from fat, 35-60% energy  from carbohydrates, less than 200mg  of dietary cholesterol, 20-35 gm of total fiber daily;Understanding of distribution of calorie intake throughout the day with the consumption of 4-5 meals/snacks    Hypertension Yes    Intervention Provide education on lifestyle modifcations including regular physical activity/exercise, weight management, moderate sodium restriction and increased consumption of fresh  fruit, vegetables, and low fat dairy, alcohol moderation, and smoking cessation.;Monitor prescription use compliance.    Expected Outcomes Short Term: Continued assessment and intervention until BP is < 140/70mm HG in hypertensive participants. < 130/70mm HG in hypertensive participants with diabetes, heart failure or chronic kidney disease.;Long Term: Maintenance of blood pressure at goal levels.    Lipids Yes    Intervention Provide education and support for participant on nutrition & aerobic/resistive exercise along with prescribed medications to achieve LDL 70mg , HDL >40mg .    Expected Outcomes Short Term: Participant states understanding of desired cholesterol values and is compliant with medications prescribed. Participant is following exercise prescription and nutrition guidelines.;Long Term: Cholesterol controlled with medications as prescribed, with individualized exercise RX and with personalized nutrition plan. Value goals: LDL < 70mg , HDL > 40 mg.    Stress Yes    Intervention Offer individual and/or small group education and counseling on adjustment to heart disease, stress management and health-related lifestyle change. Teach and support self-help strategies.;Refer participants experiencing significant psychosocial distress to appropriate mental health specialists for further evaluation and treatment. When possible, include family members and significant others in education/counseling sessions.    Expected Outcomes Short Term: Participant demonstrates changes in health-related behavior, relaxation and other stress management skills, ability to obtain effective social support, and compliance with psychotropic medications if prescribed.;Long Term: Emotional wellbeing is indicated by absence of clinically significant psychosocial distress or social isolation.           Core Components/Risk Factors/Patient Goals Review:   Goals and Risk Factor Review    Row Name 05/27/20 0959 05/29/20 0951  08/12/20 1227         Core Components/Risk Factors/Patient Goals Review   Personal Goals Review Weight Management/Obesity;Hypertension;Lipids;Stress Weight Management/Obesity;Hypertension;Lipids;Stress Weight Management/Obesity;Hypertension;Lipids;Stress     Review Zulma started exercise on 05/27/20 and did well with exercise on her first day Sarabelle is off to a good start to exercise. Kahlia returned to exercise on 08/12/20 after being out with COVID 19. Esmerelda did well with exercise today     Expected Outcomes Patient will continue to partcipate in phase 2 cardiac rehab for exercise, nutrition and lifestyle modifications Patient will continue to partcipate in phase 2 cardiac rehab for exercise, nutrition and lifestyle modifications Patient will continue to partcipate in phase 2 cardiac rehab for exercise, nutrition and lifestyle modifications            Core Components/Risk Factors/Patient Goals at Discharge (Final Review):   Goals and Risk Factor Review - 08/12/20 1227      Core Components/Risk Factors/Patient Goals Review   Personal Goals Review Weight Management/Obesity;Hypertension;Lipids;Stress    Review Gracie returned to exercise on 08/12/20 after being out with COVID 19. Beya did well with exercise today    Expected Outcomes Patient will continue to partcipate in phase 2 cardiac rehab for exercise, nutrition and lifestyle modifications           ITP Comments:  ITP Comments    Row Name 05/23/20 1521 05/29/20 0944 08/12/20 1222       ITP Comments Dr. Fransico Him, Medical Director 30 Day ITP Review. Terease is off to a good start to  exercise at cardiac rehab 30 Day ITP Review. Taquita started restarted exercise on 08/12/20 and did well with exercise            Comments: See ITP comments.Barnet Pall, RN,BSN 08/15/2020 1:22 PM

## 2020-08-12 NOTE — Progress Notes (Signed)
Daily Session Note  Patient Details  Name: Suzanne Galloway MRN: [id number] Date of Birth: 1941/01/27 Referring Provider:     CARDIAC REHAB PHASE II ORIENTATION from 05/23/2020 in [place]  Referring Provider Dr Herbie [place] Preli-Dr Fransico Him MD, Covering      Encounter Date: 08/12/2020  Check In:  Session Check In - 08/12/20 0905      Check-In   Supervising physician immediately available to respond to emergencies [facility] immediately available    Physician(s) Dr. Maylene Roes    Location [facility]    Staff Present Dorma Russell, MS,ACSM CEP, Exercise Physiologist;Cleven Jansma, RN, Milus Glazier, MS, EP-C, CCRP;Olinty Celesta Aver, MS, ACSM CEP, Exercise Physiologist;Carlette Wilber Oliphant, RN, BSN    Virtual Visit No    Medication changes reported     No    Fall or balance concerns reported    No    Tobacco Cessation No Change    Warm-up and Cool-down Performed on first and last piece of equipment    Resistance Training Performed Yes    VAD Patient? No    PAD/SET Patient? No      Pain Assessment   Currently in Pain? No/denies    Pain Score 0-No pain    Multiple Pain Sites No           Capillary Blood Glucose: No results found for this or any previous visit (from the past 24 hour(s)).   Exercise Prescription Changes - 08/12/20 1000      Response to Exercise   Blood Pressure (Admit) 130/70    Blood Pressure (Exercise) 124/60    Blood Pressure (Exit) 114/74    Heart Rate (Admit) 87 bpm    Heart Rate (Exercise) 99 bpm    Heart Rate (Exit) 80 bpm    Rating of Perceived Exertion (Exercise) 12    Perceived Dyspnea (Exercise) 0    Symptoms None    Comments Pt first day back     Duration Progress to 30 minutes of  aerobic without signs/symptoms of physical distress    Intensity THRR unchanged      Progression   Progression Continue to progress workloads to maintain intensity without signs/symptoms of physical distress.     Average METs 2.4      Resistance Training   Training Prescription Yes    Weight 3lbs    Reps 10-15    Time 10 Minutes      Interval Training   Interval Training No      Treadmill   MPH 1.7    Grade 1    Minutes 15    METs 2.54      Bike   Level 0      NuStep   Level 2    SPM 7    Minutes 15    METs 2.3           Social History   Tobacco Use  Smoking Status Never Smoker  Smokeless Tobacco Never Used    Goals Met:  Exercise tolerated well No report of cardiac concerns or symptoms Strength training completed today  Goals Unmet:  Not Applicable  Comments: Pt started cardiac rehab today.  Pt tolerated light exercise without difficulty. VSS, telemetry-Sinus Rhythm, asymptomatic.  Medication list reconciled. Pt denies barriers to medicaiton compliance.  PSYCHOSOCIAL ASSESSMENT:  PHQ-0. Pt exhibits positive coping skills, hopeful outlook with supportive family. No psychosocial needs identified at this time, no psychosocial interventions necessary.  Alegria returned after being  out with COVID 19  Pt oriented to exercise equipment and routine.    Understanding verbalized.   Dr. Traci Turner is Medical Director for Cardiac Rehab at [facility]. 

## 2020-08-14 ENCOUNTER — Encounter (HOSPITAL_COMMUNITY)
Admission: RE | Admit: 2020-08-14 | Discharge: 2020-08-14 | Disposition: A | Discharge status: Home / Self Care | Payer: Medicare Other | Source: Ambulatory Visit | Attending: Cardiology | Admitting: Cardiology

## 2020-08-14 ENCOUNTER — Other Ambulatory Visit: Payer: Self-pay

## 2020-08-14 DIAGNOSIS — Z955 Presence of coronary angioplasty implant and graft: Secondary | ICD-10-CM | POA: Diagnosis not present

## 2020-08-14 NOTE — Progress Notes (Signed)
Suzanne Galloway 79 y.o. female Nutrition Note  Visit Diagnosis: 03/10/20 STEMI, DES LAD   Past Medical History:  Diagnosis Date  . Anxiety   . Arthritis    "hands, back, toes" (01/01/2015)  . Asthma   . Chronic bronchitis ([place])    "get it just about q yr" (01/01/2015)  . Coronary artery disease    S/P STEMI 03/10/20, DES LAD, VFib Arrest  . Depression   . GERD (gastroesophageal reflux disease)   . Hearing loss    hearing aids  . High cholesterol   . Hyperlipemia   . Hypertension   . Hypothyroidism   . IBS (irritable bowel syndrome)   . Mitral valve prolapse   . Seizures [name])    dx Aug 2018, most recent seizure 10/13/17, partial complex  . Urinary incontinence      Medications reviewed.   Current Outpatient Medications:  .  Ascorbic Acid (VITAMIN C PO), Take 1 tablet by mouth daily., Disp: , Rfl:  .  aspirin 81 MG chewable tablet, Chew 81 mg by mouth in the morning and at bedtime. , Disp: , Rfl:  .  atorvastatin (LIPITOR) 80 MG tablet, Take 80 mg by mouth daily., Disp: , Rfl:  .  Calcium Carbonate-Vitamin D (CALCIUM 600+D) 600-400 MG-UNIT tablet, Take 1 tablet by mouth daily., Disp: , Rfl:  .  escitalopram (LEXAPRO) 20 MG tablet, Take 20 mg by mouth daily., Disp: , Rfl: 11 .  famotidine (PEPCID) 40 MG tablet, Take 40 mg by mouth 2 (two) times daily. , Disp: , Rfl:  .  isosorbide mononitrate (IMDUR) 30 MG 24 hr tablet, Take 30 mg by mouth daily., Disp: , Rfl:  .  lacosamide ([name]) 50 MG TABS tablet, Take 1 tablet (50 mg total) by mouth 2 (two) times daily., Disp: 180 tablet, Rfl: 4 .  levothyroxine (SYNTHROID) 75 MCG tablet, Take 75 mcg by mouth every morning. , Disp: , Rfl:  .  loperamide (IMODIUM A-D) 2 MG tablet, Take 2 mg by mouth 4 (four) times daily as needed for diarrhea or loose stools., Disp: , Rfl:  .  metoprolol succinate (TOPROL-XL) 25 MG 24 hr tablet, Take 25 mg by mouth daily., Disp: , Rfl:  .  Multiple Vitamins-Minerals (PRESERVISION AREDS PO), Take 1 tablet by  mouth daily. , Disp: , Rfl:  .  nitroGLYCERIN (NITROSTAT) 0.4 MG SL tablet, Place 0.4 mg under the tongue every 5 (five) minutes as needed for chest pain., Disp: , Rfl:  .  Polyethyl Glycol-Propyl Glycol (SYSTANE OP), Apply 1 drop to eye daily as needed., Disp: , Rfl:  .  sacubitril-valsartan (ENTRESTO) 24-26 MG, Take 1 tablet by mouth 2 (two) times daily. , Disp: , Rfl:  .  spironolactone (ALDACTONE) 25 MG tablet, Take 25 mg by mouth daily., Disp: , Rfl:  .  sucralfate (CARAFATE) 1 g tablet, Take 1 g by mouth 4 (four) times daily -  with meals and at bedtime., Disp: , Rfl:  .  ticagrelor (BRILINTA) 90 MG TABS tablet, Take 90 mg by mouth 2 (two) times daily. , Disp: , Rfl:  .  zonisamide (ZONEGRAN) 100 MG capsule, Take 2 capsules (200 mg total) by mouth at bedtime., Disp: 180 capsule, Rfl: 4   Ht Readings from Last 1 Encounters:  05/23/20 5' 2.5" (1.588 m)     Wt Readings from Last 3 Encounters:  05/23/20 129 lb 13.6 oz (58.9 kg)  10/03/19 129 lb (58.5 kg)  05/29/19 130 lb 6.4 oz (59.1 kg)  There is no height or weight on file to calculate BMI.   Social History   Tobacco Use  Smoking Status Never Smoker  Smokeless Tobacco Never Used     Lab Results  Component Value Date   CHOL 174 01/02/2015   Lab Results  Component Value Date   HDL 37 (L) 01/02/2015   Lab Results  Component Value Date   LDLCALC 105 (H) 01/02/2015   Lab Results  Component Value Date   TRIG 158 (H) 01/02/2015     Lab Results  Component Value Date   HGBA1C 6.3 (H) 01/03/2015     CBG (last 3)  No results for input(s): GLUCAP in the last 72 hours.   Nutrition Note  Spoke with pt. Nutrition Plan and Nutrition Survey goals reviewed with pt. Pt is following a Heart Healthy diet.  Recent nutrition assessment on 05/29/20. Reviewed updates from pt and EMR.  She had covid for 4 weeks. Now experiencing brain fog. She lost some weight but has gained it all back. Her appetite and diet have gotten back  to normal.  Pt expressed understanding of the information reviewed.   Nutrition Diagnosis ? Food-and nutrition-related knowledge deficit related to lack of exposure to information as related to diagnosis of: ? CVD ? Pre-diabetes  Nutrition Intervention ? Pt's individual nutrition plan reviewed with pt. ? Benefits of adopting Heart Healthy diet discussed when Medficts reviewed.   ? Continue client-centered nutrition education by RD, as part of interdisciplinary care.  Goal(s)  ? Pt to build a healthy plate including vegetables, fruits, whole grains, and low-fat dairy products in a heart healthy meal plan. ? Weight maintenance 130-135 lbs  Plan:   Will provide client-centered nutrition education as part of interdisciplinary care  Monitor and evaluate progress toward nutrition goal with team.   Michaele Offer, MS, RDN, LDN

## 2020-08-15 DIAGNOSIS — H04123 Dry eye syndrome of bilateral lacrimal glands: Secondary | ICD-10-CM | POA: Diagnosis not present

## 2020-08-15 DIAGNOSIS — Z961 Presence of intraocular lens: Secondary | ICD-10-CM | POA: Diagnosis not present

## 2020-08-15 DIAGNOSIS — H43813 Vitreous degeneration, bilateral: Secondary | ICD-10-CM | POA: Diagnosis not present

## 2020-08-16 ENCOUNTER — Encounter (HOSPITAL_COMMUNITY): Payer: Medicare Other

## 2020-08-16 DIAGNOSIS — E039 Hypothyroidism, unspecified: Secondary | ICD-10-CM | POA: Diagnosis not present

## 2020-08-16 DIAGNOSIS — F419 Anxiety disorder, unspecified: Secondary | ICD-10-CM | POA: Diagnosis not present

## 2020-08-16 DIAGNOSIS — E782 Mixed hyperlipidemia: Secondary | ICD-10-CM | POA: Diagnosis not present

## 2020-08-16 DIAGNOSIS — I10 Essential (primary) hypertension: Secondary | ICD-10-CM | POA: Diagnosis not present

## 2020-08-16 DIAGNOSIS — Z0000 Encounter for general adult medical examination without abnormal findings: Secondary | ICD-10-CM | POA: Diagnosis not present

## 2020-08-16 DIAGNOSIS — Z1389 Encounter for screening for other disorder: Secondary | ICD-10-CM | POA: Diagnosis not present

## 2020-08-19 ENCOUNTER — Encounter (HOSPITAL_COMMUNITY)
Admission: RE | Admit: 2020-08-19 | Discharge: 2020-08-19 | Disposition: A | Discharge status: Home / Self Care | Payer: Medicare Other | Source: Ambulatory Visit | Attending: Cardiology | Admitting: Cardiology

## 2020-08-19 ENCOUNTER — Other Ambulatory Visit: Payer: Self-pay

## 2020-08-19 DIAGNOSIS — I2102 ST elevation (STEMI) myocardial infarction involving left anterior descending coronary artery: Secondary | ICD-10-CM

## 2020-08-19 DIAGNOSIS — Z955 Presence of coronary angioplasty implant and graft: Secondary | ICD-10-CM | POA: Insufficient documentation

## 2020-08-21 ENCOUNTER — Encounter (HOSPITAL_COMMUNITY)
Admission: RE | Admit: 2020-08-21 | Discharge: 2020-08-21 | Disposition: A | Discharge status: Home / Self Care | Payer: Medicare Other | Source: Ambulatory Visit | Attending: Cardiology | Admitting: Cardiology

## 2020-08-21 ENCOUNTER — Other Ambulatory Visit: Payer: Self-pay

## 2020-08-21 DIAGNOSIS — I2102 ST elevation (STEMI) myocardial infarction involving left anterior descending coronary artery: Secondary | ICD-10-CM

## 2020-08-21 DIAGNOSIS — Z955 Presence of coronary angioplasty implant and graft: Secondary | ICD-10-CM | POA: Diagnosis not present

## 2020-08-23 ENCOUNTER — Encounter (HOSPITAL_COMMUNITY)
Admission: RE | Admit: 2020-08-23 | Discharge: 2020-08-23 | Disposition: A | Discharge status: Home / Self Care | Payer: Medicare Other | Source: Ambulatory Visit | Attending: Cardiology | Admitting: Cardiology

## 2020-08-23 ENCOUNTER — Other Ambulatory Visit: Payer: Self-pay

## 2020-08-23 DIAGNOSIS — Z955 Presence of coronary angioplasty implant and graft: Secondary | ICD-10-CM

## 2020-08-23 DIAGNOSIS — I2102 ST elevation (STEMI) myocardial infarction involving left anterior descending coronary artery: Secondary | ICD-10-CM | POA: Diagnosis not present

## 2020-08-26 ENCOUNTER — Other Ambulatory Visit: Payer: Self-pay

## 2020-08-26 ENCOUNTER — Encounter (HOSPITAL_COMMUNITY)
Admission: RE | Admit: 2020-08-26 | Discharge: 2020-08-26 | Disposition: A | Discharge status: Home / Self Care | Payer: Medicare Other | Source: Ambulatory Visit | Attending: Cardiology | Admitting: Cardiology

## 2020-08-26 DIAGNOSIS — I2102 ST elevation (STEMI) myocardial infarction involving left anterior descending coronary artery: Secondary | ICD-10-CM

## 2020-08-26 DIAGNOSIS — Z955 Presence of coronary angioplasty implant and graft: Secondary | ICD-10-CM

## 2020-08-28 ENCOUNTER — Other Ambulatory Visit: Payer: Self-pay

## 2020-08-28 ENCOUNTER — Encounter (HOSPITAL_COMMUNITY)
Admission: RE | Admit: 2020-08-28 | Discharge: 2020-08-28 | Disposition: A | Discharge status: Home / Self Care | Payer: Medicare Other | Source: Ambulatory Visit | Attending: Cardiology | Admitting: Cardiology

## 2020-08-28 DIAGNOSIS — I2102 ST elevation (STEMI) myocardial infarction involving left anterior descending coronary artery: Secondary | ICD-10-CM | POA: Diagnosis not present

## 2020-08-28 DIAGNOSIS — Z955 Presence of coronary angioplasty implant and graft: Secondary | ICD-10-CM

## 2020-08-28 NOTE — Progress Notes (Signed)
Reviewed home exercise Rx today with patient. Pt voices that she currently not exercising at home. We discussed adding at least 2 days/wek of exercise at home to meet the recommendations of 5-7 days/week of exercise. Pt voices she has a treadmill and bike at home which she can use in addition to walking outdoors in nice weather. Importance of warm-up, cool-down and stretching discussed. We also reviewed her THRR of 57-114 and exercising to an effort between 11-13 on the RPE scale. We reviewed weather parameters for temperature and humidity and ws encouraged to hydrate before, during and after exercise. S/S to terminate exercise were discussed and when to call MD vs 911. NTG use was reviewed with teach-back. Pt verbalized understanding of the home exercise Rx and was provided a copy.   Suzanne Rubenstein MS, ACSM-EP-C, CCRP

## 2020-08-30 ENCOUNTER — Other Ambulatory Visit: Payer: Self-pay

## 2020-08-30 ENCOUNTER — Encounter (HOSPITAL_COMMUNITY)
Admission: RE | Admit: 2020-08-30 | Discharge: 2020-08-30 | Disposition: A | Discharge status: Home / Self Care | Payer: Medicare Other | Source: Ambulatory Visit | Attending: Cardiology | Admitting: Cardiology

## 2020-08-30 DIAGNOSIS — Z955 Presence of coronary angioplasty implant and graft: Secondary | ICD-10-CM

## 2020-08-30 DIAGNOSIS — I2102 ST elevation (STEMI) myocardial infarction involving left anterior descending coronary artery: Secondary | ICD-10-CM | POA: Diagnosis not present

## 2020-09-02 ENCOUNTER — Encounter (HOSPITAL_COMMUNITY)
Admission: RE | Admit: 2020-09-02 | Discharge: 2020-09-02 | Disposition: A | Discharge status: Home / Self Care | Payer: Medicare Other | Source: Ambulatory Visit | Attending: Cardiology | Admitting: Cardiology

## 2020-09-02 ENCOUNTER — Other Ambulatory Visit: Payer: Self-pay

## 2020-09-02 DIAGNOSIS — I2102 ST elevation (STEMI) myocardial infarction involving left anterior descending coronary artery: Secondary | ICD-10-CM

## 2020-09-02 DIAGNOSIS — Z955 Presence of coronary angioplasty implant and graft: Secondary | ICD-10-CM | POA: Diagnosis not present

## 2020-09-03 ENCOUNTER — Encounter: Payer: Self-pay | Admitting: Internal Medicine

## 2020-09-03 ENCOUNTER — Ambulatory Visit (INDEPENDENT_AMBULATORY_CARE_PROVIDER_SITE_OTHER): Payer: Medicare Other | Admitting: Internal Medicine

## 2020-09-03 ENCOUNTER — Ambulatory Visit
Admission: RE | Admit: 2020-09-03 | Discharge: 2020-09-03 | Disposition: A | Discharge status: Home / Self Care | Payer: Medicare Other | Source: Ambulatory Visit | Attending: Internal Medicine | Admitting: Internal Medicine

## 2020-09-03 VITALS — BP 126/69 | HR 86 | Temp 97.7°F | Resp 16 | Ht 66.0 in | Wt 162.2 lb

## 2020-09-03 DIAGNOSIS — R0602 Shortness of breath: Secondary | ICD-10-CM

## 2020-09-03 DIAGNOSIS — R55 Syncope and collapse: Secondary | ICD-10-CM

## 2020-09-03 DIAGNOSIS — R5383 Other fatigue: Secondary | ICD-10-CM

## 2020-09-03 DIAGNOSIS — R42 Dizziness and giddiness: Secondary | ICD-10-CM

## 2020-09-03 NOTE — Progress Notes (Signed)
Chief Complaint: Patient presents today with   Follow-up on the blood pressure and cholesterol    Patient WAS wearing a surgical mask   Contact and Droplet precautions were followed when caring for the patient.   PPE used by provider during encounter: Surgical mask,face shield and gloves used     [name] If you are reviewing this progress note and have questions about the meaning or medical terms being used, please schedule an appointment or bring it up at your next follow-up appointment. Medical notes are meant to be a communication tool between medical professionals and require medical terms to be used for efficiency.      - Voice recognition software may have been used in portions of this report.   Inadvertent computerized transcription errors may be present despite efforts.        Shortness of breath -she is having shortness of breath ongoing for the last few weeks especially walking up the stairs and walking less than a block denies any chest pain, cough, wheezing, palpitations or sweating.  She does complain of generalized weakness and fatigue     She was on amoxicillin and later changed to minocycline for infection in her teeth, was on antibiotics for almost 3 months    She did receive her Covid 19 booster recently next day  could not eat all day, did drink 2 to 3 glasses of iced tea, went to the restroom at night as  was getting from the toilet seat had to grab the sink as felt dizzy/fainting, her husband helped her denies any loss of consciousness      Vs  BP 126/69 (SITE: left arm, Orthostatic Position: sitting, Cuff Size: regular)   Pulse 86   Temp 36.5 C (97.7 F) (Temporal)   Resp 16   Ht 1.676 m (5\' 6" )   Wt 73.6 kg (162 lb 3.2 oz)   SpO2 99%   BMI 26.18 kg/m     Ros  complete review of symptons was done all other systems are negative except as otherwise stated in HPI      Physical exam -  General Appearance: healthy, alert, no distress, pleasant affect, cooperative.  Eyes:  conjunctivae  and corneas clear. PERRL, EOM's intact. sclerae normal.  Ears:  external inspection of ears show no abnormality.  Lungs: no chest deformities noted .lungs clear to auscultation.  Heart:  normal rate and regular rhythm, no murmurs,  Extremities:  no cyanosis, clubbing, or edema.    ASSESMENT AND PLAN     (R42,  R55) Postural dizziness with presyncope  (primary encounter diagnosis)  Comment:   Plan: POC Electrocardiogram with Rhythm Strip,         Complete (93000)        Her EKG is normal sinus rhythm at 70    (R53.83) Fatigue, unspecified type  Comment: Etiology to be determined  Plan: Proceed with further testing    (R06.02) SOB (shortness of breath)  Comment:   Plan: EXERCISE STRESS ECHOCARDIOGRAM, STRESS EKG         MONITOR, DX CHEST 2 VIEWS, Cardiology Referral             I did review patient's past medical and family/social history, no changes noted.  Barriers to Learning assessed: none. Patient verbalizes understanding of teaching and instructions.  MD      Electronically Signed By:  Zettie Cooley, MD  Physician associate   [facility] [place]  [phone]

## 2020-09-03 NOTE — Nursing Note (Signed)
Vital signs taken, allergies verified, screened for pain, tobacco hx verified. Pt has no additional questions or concerns prior to seeing physician.   Knox Cervi MA II  Patient WAS wearing a surgical mask  Contact and Droplet precautions were followed when caring for the patient.   PPE used by provider during encounter: Surgical mask and Face Shield/Goggles

## 2020-09-04 ENCOUNTER — Encounter (HOSPITAL_COMMUNITY)
Admission: RE | Admit: 2020-09-04 | Discharge: 2020-09-04 | Disposition: A | Discharge status: Home / Self Care | Payer: Medicare Other | Source: Ambulatory Visit | Attending: Cardiology | Admitting: Cardiology

## 2020-09-04 ENCOUNTER — Other Ambulatory Visit: Payer: Self-pay

## 2020-09-04 DIAGNOSIS — Z955 Presence of coronary angioplasty implant and graft: Secondary | ICD-10-CM | POA: Diagnosis not present

## 2020-09-04 DIAGNOSIS — I2102 ST elevation (STEMI) myocardial infarction involving left anterior descending coronary artery: Secondary | ICD-10-CM

## 2020-09-05 LAB — POC ELECTROCARDIOGRAM WITH RHYTHM STRIP: QTC: 451

## 2020-09-06 ENCOUNTER — Encounter (HOSPITAL_COMMUNITY)
Admission: RE | Admit: 2020-09-06 | Discharge: 2020-09-06 | Disposition: A | Discharge status: Home / Self Care | Payer: Medicare Other | Source: Ambulatory Visit | Attending: Cardiology | Admitting: Cardiology

## 2020-09-06 ENCOUNTER — Other Ambulatory Visit: Payer: Self-pay

## 2020-09-06 DIAGNOSIS — I2102 ST elevation (STEMI) myocardial infarction involving left anterior descending coronary artery: Secondary | ICD-10-CM

## 2020-09-06 DIAGNOSIS — Z955 Presence of coronary angioplasty implant and graft: Secondary | ICD-10-CM | POA: Diagnosis not present

## 2020-09-09 ENCOUNTER — Other Ambulatory Visit: Payer: Self-pay

## 2020-09-09 ENCOUNTER — Encounter (HOSPITAL_COMMUNITY)
Admission: RE | Admit: 2020-09-09 | Discharge: 2020-09-09 | Disposition: A | Discharge status: Home / Self Care | Payer: Medicare Other | Source: Ambulatory Visit | Attending: Cardiology | Admitting: Cardiology

## 2020-09-09 VITALS — Ht 62.5 in | Wt 132.5 lb

## 2020-09-09 DIAGNOSIS — I2102 ST elevation (STEMI) myocardial infarction involving left anterior descending coronary artery: Secondary | ICD-10-CM | POA: Diagnosis not present

## 2020-09-09 DIAGNOSIS — Z955 Presence of coronary angioplasty implant and graft: Secondary | ICD-10-CM

## 2020-09-09 NOTE — Progress Notes (Signed)
Cardiac Individual Treatment Plan  Patient Details  Name: Suzanne Galloway MRN: [id number] Date of Birth: September 05, 1941 Referring Provider:     CARDIAC REHAB PHASE II ORIENTATION from 05/23/2020 in [place]  Referring Provider Dr Herbie Baltimore Preli-Dr Fransico Him MD, Covering      Initial Encounter Date:    CARDIAC REHAB PHASE II ORIENTATION from 05/23/2020 in [place]  Date 05/23/20      Visit Diagnosis: 03/10/20 STEMI, DES LAD   ST elevation myocardial infarction involving left anterior descending (LAD) coronary artery ([place])  Patient's Home Medications on Admission:  Current Outpatient Medications:  .  Ascorbic Acid (VITAMIN C PO), Take 1 tablet by mouth daily., Disp: , Rfl:  .  aspirin 81 MG chewable tablet, Chew 81 mg by mouth in the morning and at bedtime. , Disp: , Rfl:  .  atorvastatin (LIPITOR) 80 MG tablet, Take 80 mg by mouth daily., Disp: , Rfl:  .  Calcium Carbonate-Vitamin D (CALCIUM 600+D) 600-400 MG-UNIT tablet, Take 1 tablet by mouth daily., Disp: , Rfl:  .  escitalopram (LEXAPRO) 20 MG tablet, Take 20 mg by mouth daily., Disp: , Rfl: 11 .  famotidine (PEPCID) 40 MG tablet, Take 40 mg by mouth 2 (two) times daily. , Disp: , Rfl:  .  isosorbide mononitrate (IMDUR) 30 MG 24 hr tablet, Take 30 mg by mouth daily., Disp: , Rfl:  .  lacosamide ([name]) 50 MG TABS tablet, Take 1 tablet (50 mg total) by mouth 2 (two) times daily., Disp: 180 tablet, Rfl: 4 .  levothyroxine (SYNTHROID) 75 MCG tablet, Take 75 mcg by mouth every morning. , Disp: , Rfl:  .  loperamide (IMODIUM A-D) 2 MG tablet, Take 2 mg by mouth 4 (four) times daily as needed for diarrhea or loose stools., Disp: , Rfl:  .  metoprolol succinate (TOPROL-XL) 25 MG 24 hr tablet, Take 25 mg by mouth daily., Disp: , Rfl:  .  Multiple Vitamins-Minerals (PRESERVISION AREDS PO), Take 1 tablet by mouth daily. , Disp: , Rfl:  .  nitroGLYCERIN (NITROSTAT) 0.4 MG SL tablet,  Place 0.4 mg under the tongue every 5 (five) minutes as needed for chest pain., Disp: , Rfl:  .  Polyethyl Glycol-Propyl Glycol (SYSTANE OP), Apply 1 drop to eye daily as needed., Disp: , Rfl:  .  sacubitril-valsartan (ENTRESTO) 24-26 MG, Take 1 tablet by mouth 2 (two) times daily. , Disp: , Rfl:  .  spironolactone (ALDACTONE) 25 MG tablet, Take 25 mg by mouth daily., Disp: , Rfl:  .  sucralfate (CARAFATE) 1 g tablet, Take 1 g by mouth 4 (four) times daily -  with meals and at bedtime., Disp: , Rfl:  .  ticagrelor (BRILINTA) 90 MG TABS tablet, Take 90 mg by mouth 2 (two) times daily. , Disp: , Rfl:  .  zonisamide (ZONEGRAN) 100 MG capsule, Take 2 capsules (200 mg total) by mouth at bedtime., Disp: 180 capsule, Rfl: 4  Past Medical History: Past Medical History:  Diagnosis Date  . Anxiety   . Arthritis    "hands, back, toes" (01/01/2015)  . Asthma   . Chronic bronchitis ([place])    "get it just about q yr" (01/01/2015)  . Coronary artery disease    S/P STEMI 03/10/20, DES LAD, VFib Arrest  . Depression   . GERD (gastroesophageal reflux disease)   . Hearing loss    hearing aids  . High cholesterol   . Hyperlipemia   .  Hypertension   . Hypothyroidism   . IBS (irritable bowel syndrome)   . Mitral valve prolapse   . Seizures [facility])    dx Aug 2018, most recent seizure 10/13/17, partial complex  . Urinary incontinence     Tobacco Use: Social History   Tobacco Use  Smoking Status Never Smoker  Smokeless Tobacco Never Used    Labs: Recent Review Flowsheet Data    Labs for ITP Cardiac and Pulmonary Rehab Latest Ref Rng & Units 12/27/2014 01/02/2015 01/03/2015   Cholestrol 0 - 200 mg/dL 182 174 -   LDLCALC 0 - 99 mg/dL 96 105(H) -   HDL >39 mg/dL 38(L) 37(L) -   Trlycerides <150 mg/dL 241(H) 158(H) -   Hemoglobin A1c 4.8 - 5.6 % - - 6.3(H)      Capillary Blood Glucose: Lab Results  Component Value Date   GLUCAP 106 (H) 02/20/2017   GLUCAP 101 (H) 02/20/2017   GLUCAP 106 (H)  02/19/2017   GLUCAP 112 (H) 02/19/2017   GLUCAP 171 (H) 01/06/2015     Exercise Target Goals: Exercise Program Goal: Individual exercise prescription set using results from initial 6 min walk test and THRR while considering  patient's activity barriers and safety.   Exercise Prescription Goal: Starting with aerobic activity 30 plus minutes a day, 3 days per week for initial exercise prescription. Provide home exercise prescription and guidelines that participant acknowledges understanding prior to discharge.  Activity Barriers & Risk Stratification:  Activity Barriers & Cardiac Risk Stratification - 05/23/20 1522      Activity Barriers & Cardiac Risk Stratification   Activity Barriers Back Problems    Cardiac Risk Stratification High           6 Minute Walk:  6 Minute Walk    Row Name 05/23/20 1521 09/09/20 0850       6 Minute Walk   Phase Initial Discharge    Distance 1520 feet 1674 feet    Distance % Change -- 10.13 %    Distance Feet Change -- 154 ft    Walk Time 6 minutes 6 minutes    # of Rest Breaks 0 0    MPH 2.9 3.2    METS 3.1 3.4    RPE 12 10    Perceived Dyspnea  0 0    VO2 Peak 10.7 11.8    Symptoms No No    Resting HR 67 bpm 74 bpm    Resting BP 118/62 122/70    Resting Oxygen Saturation  99 % --    Exercise Oxygen Saturation  during 6 min walk 98 % --    Max Ex. HR 102 bpm 110 bpm    Max Ex. BP 140/60 140/80    2 Minute Post BP 120/70 --           Oxygen Initial Assessment:   Oxygen Re-Evaluation:   Oxygen Discharge (Final Oxygen Re-Evaluation):   Initial Exercise Prescription:  Initial Exercise Prescription - 05/23/20 1500      Date of Initial Exercise RX and Referring Provider   Date 05/23/20    Referring Provider Dr Herbie Baltimore Preli-Dr Fransico Him MD, Covering    Expected Discharge Date 07/19/20      Bike   Level 1.5    Watts 20    Minutes 15    METs 2.7      NuStep   Level 2    SPM 75    Minutes 15    METs 2.5  Prescription Details   Frequency (times per week) 3x    Duration Progress to 10 minutes continuous walking  at current work load and total walking time to 30-45 min      Intensity   THRR 40-80% of Max Heartrate 57-114    Ratings of Perceived Exertion 11-13    Perceived Dyspnea 0-4      Progression   Progression Continue progressive overload as per policy without signs/symptoms or physical distress.      Resistance Training   Training Prescription Yes    Weight 3lbs    Reps 10-15           Perform Capillary Blood Glucose checks as needed.  Exercise Prescription Changes:   Exercise Prescription Changes    Row Name 05/27/20 1000 08/12/20 1000 08/28/20 1000 09/09/20 1030       Response to Exercise   Blood Pressure (Admit) 100/60 130/70 130/54 122/70    Blood Pressure (Exercise) 110/64 124/60 128/74 140/80    Blood Pressure (Exit) 110/70 114/74 122/70 122/62    Heart Rate (Admit) 82 bpm 87 bpm 84 bpm 74 bpm    Heart Rate (Exercise) 91 bpm 99 bpm 94 bpm 104 bpm    Heart Rate (Exit) 77 bpm 80 bpm 78 bpm 74 bpm    Rating of Perceived Exertion (Exercise) 12 12 11 11     Perceived Dyspnea (Exercise) 0 0 0 0    Symptoms None None None None    Comments Pt's first day of exercise Pt first day back  Reviewed home exercise Rx Reviewed METs    Duration Progress to 30 minutes of  aerobic without signs/symptoms of physical distress Progress to 30 minutes of  aerobic without signs/symptoms of physical distress Continue with 30 min of aerobic exercise without signs/symptoms of physical distress. Continue with 30 min of aerobic exercise without signs/symptoms of physical distress.    Intensity THRR unchanged THRR unchanged THRR unchanged THRR unchanged      Progression   Progression Continue to progress workloads to maintain intensity without signs/symptoms of physical distress. Continue to progress workloads to maintain intensity without signs/symptoms of physical distress. Continue to  progress workloads to maintain intensity without signs/symptoms of physical distress. Continue to progress workloads to maintain intensity without signs/symptoms of physical distress.    Average METs 2.8 2.4 2.7 3      Resistance Training   Training Prescription Yes Yes Yes Yes    Weight 3lbs 3lbs 3lbs 3 lbs    Reps 10-15 10-15 10-15 10-15    Time 10 Minutes 10 Minutes 10 Minutes 10 Minutes      Interval Training   Interval Training No No No No      Treadmill   MPH -- 1.7 2.3 2.3    Grade -- 1 1 3     Minutes -- 15 15 15     METs -- 2.54 3.08 3.39      Bike   Level 1.5 0 -- --    Watts 25 -- -- --    Minutes 15 -- -- --    METs 3.1 -- -- --      NuStep   Level 2 2 3 4     SPM 75 7 75 85    Minutes 15 15 15 15     METs 2.5 2.3 2.2 2.7      Home Exercise Plan   Plans to continue exercise at -- -- Home (comment) Home (comment)    Frequency -- -- Add 2  additional days to program exercise sessions. Add 2 additional days to program exercise sessions.    Initial Home Exercises Provided -- -- 08/28/20 08/28/20           Exercise Comments:   Exercise Comments    Row Name 05/27/20 1021 08/12/20 1036 08/28/20 1026 09/09/20 1030     Exercise Comments Pt's first day of exercise. Pt responded well to workloads and exericse equipment. Will continue to monitor and progress as tolerated. Pt's first day back in Cardiac Rehab since early August. Pt responded well to exercise prescription. Will continue to monitor and progress as tolerated. Reviewed home exercise Rx with patient. Pt verbalized understanding of HEP and was provided copy. Reviewed METs. Pt continues to make good progress in the CRP2 program and is walking at home 2x/week for 30 minutes.           Exercise Goals and Review:   Exercise Goals    Row Name 05/23/20 1522             Exercise Goals   Increase Physical Activity Yes       Intervention Provide advice, education, support and counseling about physical  activity/exercise needs.;Develop an individualized exercise prescription for aerobic and resistive training based on initial evaluation findings, risk stratification, comorbidities and participant's personal goals.       Expected Outcomes Short Term: Attend rehab on a regular basis to increase amount of physical activity.;Long Term: Add in home exercise to make exercise part of routine and to increase amount of physical activity.;Long Term: Exercising regularly at least 3-5 days a week.       Increase Strength and Stamina Yes       Intervention Provide advice, education, support and counseling about physical activity/exercise needs.;Develop an individualized exercise prescription for aerobic and resistive training based on initial evaluation findings, risk stratification, comorbidities and participant's personal goals.       Expected Outcomes Short Term: Increase workloads from initial exercise prescription for resistance, speed, and METs.;Short Term: Perform resistance training exercises routinely during rehab and add in resistance training at home;Long Term: Improve cardiorespiratory fitness, muscular endurance and strength as measured by increased METs and functional capacity (6MWT)       Able to understand and use rate of perceived exertion (RPE) scale Yes       Intervention Provide education and explanation on how to use RPE scale       Expected Outcomes Short Term: Able to use RPE daily in rehab to express subjective intensity level;Long Term:  Able to use RPE to guide intensity level when exercising independently       Knowledge and understanding of Target Heart Rate Range (THRR) Yes       Intervention Provide education and explanation of THRR including how the numbers were predicted and where they are located for reference       Expected Outcomes Short Term: Able to state/look up THRR;Long Term: Able to use THRR to govern intensity when exercising independently;Short Term: Able to use daily as  guideline for intensity in rehab       Able to check pulse independently Yes       Intervention Provide education and demonstration on how to check pulse in carotid and radial arteries.;Review the importance of being able to check your own pulse for safety during independent exercise       Expected Outcomes Short Term: Able to explain why pulse checking is important during independent exercise;Long Term: Able to check pulse  independently and accurately       Understanding of Exercise Prescription Yes       Intervention Provide education, explanation, and written materials on patient's individual exercise prescription       Expected Outcomes Short Term: Able to explain program exercise prescription;Long Term: Able to explain home exercise prescription to exercise independently              Exercise Goals Re-Evaluation :  Exercise Goals Re-Evaluation    Row Name 05/27/20 1022 08/12/20 1037 08/28/20 1023         Exercise Goal Re-Evaluation   Exercise Goals Review Able to understand and use rate of perceived exertion (RPE) scale;Knowledge and understanding of Target Heart Rate Range (THRR);Understanding of Exercise Prescription Increase Physical Activity;Increase Strength and Stamina;Able to understand and use rate of perceived exertion (RPE) scale;Knowledge and understanding of Target Heart Rate Range (THRR);Able to check pulse independently;Understanding of Exercise Prescription Increase Physical Activity;Increase Strength and Stamina;Able to understand and use rate of perceived exertion (RPE) scale;Knowledge and understanding of Target Heart Rate Range (THRR);Able to check pulse independently;Understanding of Exercise Prescription     Comments Pt's first day of exercise. Pt oriented to exercise equipment and prescription. Pt able to exercise 30 minutes with minimal difficulty. Pt responded well to exercise. Pt is able to exercise for 30 minutes with no difficulty. Reviewed home exercise Rx with  patient. Pt is progressing well with no complaints. Pt verbalize understanding of home exercise Rx and was provided copy.     Expected Outcomes Pt will continue to increase cardiovascular fitness. Will continue to monitor. Pt will increase strength and stamina. Pt will also work to increase comfort level on treadmill. Pt will exercise at home by either, walking, using Treadmill, or bike 2x/week for 30 minutes.             Discharge Exercise Prescription (Final Exercise Prescription Changes):  Exercise Prescription Changes - 09/09/20 1030      Response to Exercise   Blood Pressure (Admit) 122/70    Blood Pressure (Exercise) 140/80    Blood Pressure (Exit) 122/62    Heart Rate (Admit) 74 bpm    Heart Rate (Exercise) 104 bpm    Heart Rate (Exit) 74 bpm    Rating of Perceived Exertion (Exercise) 11    Perceived Dyspnea (Exercise) 0    Symptoms None    Comments Reviewed METs    Duration Continue with 30 min of aerobic exercise without signs/symptoms of physical distress.    Intensity THRR unchanged      Progression   Progression Continue to progress workloads to maintain intensity without signs/symptoms of physical distress.    Average METs 3      Resistance Training   Training Prescription Yes    Weight 3 lbs    Reps 10-15    Time 10 Minutes      Interval Training   Interval Training No      Treadmill   MPH 2.3    Grade 3    Minutes 15    METs 3.39      NuStep   Level 4    SPM 85    Minutes 15    METs 2.7      Home Exercise Plan   Plans to continue exercise at Home (comment)    Frequency Add 2 additional days to program exercise sessions.    Initial Home Exercises Provided 08/28/20           Nutrition:  Target Goals: Understanding of nutrition guidelines, daily intake of sodium 1500mg , cholesterol 200mg , calories 30% from fat and 7% or less from saturated fats, daily to have 5 or more servings of fruits and vegetables.  Biometrics:  Pre Biometrics -  05/23/20 1523      Pre Biometrics   Height 5' 2.5" (1.588 m)    Weight 58.9 kg    Waist Circumference 34.5 inches    Hip Circumference 38 inches    Waist to Hip Ratio 0.91 %    BMI (Calculated) 23.36    Triceps Skinfold 11 mm    % Body Fat 32.7 %    Grip Strength 25 kg    Flexibility 13 in    Single Leg Stand 20.43 seconds           Post Biometrics - 09/09/20 0835       Post  Biometrics   Height 5' 2.5" (1.588 m)    Weight 60.1 kg    Waist Circumference 35 inches    Hip Circumference 37.5 inches    Waist to Hip Ratio 0.93 %    BMI (Calculated) 23.83    Triceps Skinfold 11 mm    % Body Fat 33.1 %    Grip Strength 28 kg    Flexibility 12 in    Single Leg Stand 21.06 seconds           Nutrition Therapy Plan and Nutrition Goals:  Nutrition Therapy & Goals - 08/14/20 1012      Nutrition Therapy   Diet Heart Healthy    Drug/Food Interactions Statins/Certain Fruits      Personal Nutrition Goals   Nutrition Goal Pt to identify food quantities necessary to maintain weight between 125-130 lbs    Personal Goal #2 Pt to build a healthy plate including vegetables, fruits, whole grains, and low-fat dairy products in a heart healthy meal plan.      Intervention Plan   Intervention Prescribe, educate and counsel regarding individualized specific dietary modifications aiming towards targeted core components such as weight, hypertension, lipid management, diabetes, heart failure and other comorbidities.    Expected Outcomes Short Term Goal: Understand basic principles of dietary content, such as calories, fat, sodium, cholesterol and nutrients.           Nutrition Assessments:  Nutrition Assessments - 05/29/20 0956      MEDFICTS Scores   Pre Score 15          MEDIFICTS Score Key:  ?70 Need to make dietary changes   40-70 Heart Healthy Diet  ? 40 Therapeutic Level Cholesterol Diet   Picture Your Plate Scores:  <76 Unhealthy dietary pattern with much room for  improvement.  41-50 Dietary pattern unlikely to meet recommendations for good health and room for improvement.  51-60 More healthful dietary pattern, with some room for improvement.   >60 Healthy dietary pattern, although there may be some specific behaviors that could be improved.    Nutrition Goals Re-Evaluation:  Nutrition Goals Re-Evaluation    [place] Name 05/29/20 0956 08/14/20 1012 09/05/20 1431         Goals   Current Weight 129 lb (58.5 kg) 129 lb (58.5 kg) 129 lb (58.5 kg)     Nutrition Goal Pt to identify food quantities necessary to maintain weight between 125-130 lbs -- Pt to identify food quantities necessary to maintain weight between 125-130 lbs       Personal Goal #2 Re-Evaluation   Personal Goal #2 Pt to build  a healthy plate including vegetables, fruits, whole grains, and low-fat dairy products in a heart healthy meal plan. -- Pt to build a healthy plate including vegetables, fruits, whole grains, and low-fat dairy products in a heart healthy meal plan.            Nutrition Goals Discharge (Final Nutrition Goals Re-Evaluation):  Nutrition Goals Re-Evaluation - 09/05/20 1431      Goals   Current Weight 129 lb (58.5 kg)    Nutrition Goal Pt to identify food quantities necessary to maintain weight between 125-130 lbs      Personal Goal #2 Re-Evaluation   Personal Goal #2 Pt to build a healthy plate including vegetables, fruits, whole grains, and low-fat dairy products in a heart healthy meal plan.           Psychosocial: Target Goals: Acknowledge presence or absence of significant depression and/or stress, maximize coping skills, provide positive support system. Participant is able to verbalize types and ability to use techniques and skills needed for reducing stress and depression.  Initial Review & Psychosocial Screening:  Initial Psych Review & Screening - 05/23/20 1551      Initial Review   Current issues with Current Depression;History of  Depression;Current Anxiety/Panic;Current Stress Concerns    Source of Stress Concerns Chronic Illness    Comments Suzanne Galloway voices being depressed over her recent hospitalization times 2      [place]? Yes   Suzanne Galloway lives with her daughter and has a total of 3 children for support     Barriers   Psychosocial barriers to participate in program The patient should benefit from training in stress management and relaxation.      Screening Interventions   Interventions Encouraged to exercise;Provide feedback about the scores to participant;To provide support and resources with identified psychosocial needs    Expected Outcomes Short Term goal: Utilizing psychosocial counselor, staff and physician to assist with identification of specific Stressors or current issues interfering with healing process. Setting desired goal for each stressor or current issue identified.;Long Term Goal: Stressors or current issues are controlled or eliminated.;Short Term goal: Identification and review with participant of any Quality of Life or Depression concerns found by scoring the questionnaire.;Long Term goal: The participant improves quality of Life and PHQ9 Scores as seen by post scores and/or verbalization of changes           Quality of Life Scores:  Quality of Life - 05/23/20 1533      Quality of Life   Select Quality of Life      Quality of Life Scores   Health/Function Pre 26.18 %    Socioeconomic Pre 26.17 %    Psych/Spiritual Pre 27.93 %    Family Pre 30 %    GLOBAL Pre 27.06 %          Scores of 19 and below usually indicate a poorer quality of life in these areas.  A difference of  2-3 points is a clinically meaningful difference.  A difference of 2-3 points in the total score of the Quality of Life Index has been associated with significant improvement in overall quality of life, self-image, physical symptoms, and general health in studies assessing change in quality of  life.  PHQ-9: Recent Review Flowsheet Data    Depression screen [facility] 2/9 05/27/2020 05/23/2020 04/06/2020 06/11/2017   Decreased Interest 0 0 1 1   Down, Depressed, Hopeless 1 1 1 1    PHQ - 2  Score 1 1 2 2    Altered sleeping - - 0 0   Tired, decreased energy - - 1 2   Change in appetite - - 0 0   Feeling bad or failure about yourself  - - 0 0   Trouble concentrating - - 1 1   Moving slowly or fidgety/restless - - 0 0   Suicidal thoughts - - 0 0   PHQ-9 Score - - 4 5   Difficult doing work/chores - - Not difficult at all Not difficult at all     Interpretation of Total Score  Total Score Depression Severity:  1-4 = Minimal depression, 5-9 = Mild depression, 10-14 = Moderate depression, 15-19 = Moderately severe depression, 20-27 = Severe depression   Psychosocial Evaluation and Intervention:   Psychosocial Re-Evaluation:  Psychosocial Re-Evaluation    Row Name 05/29/20 0946 08/12/20 1225 09/06/20 1318         Psychosocial Re-Evaluation   Current issues with Current Stress Concerns;Current Anxiety/Panic;Current Depression Current Stress Concerns;Current Anxiety/Panic;Current Depression Current Stress Concerns;Current Anxiety/Panic;Current Depression     Comments Suzanne Galloway currently is receiving counselling once a week by [facility] and so far has not voiced any increased stressors Suzanne Galloway returned to exercise on 08/12/20 and did not voice any increased stressors Suzanne Galloway has not voiced ahy increased stressors or concerns     Expected Outcomes Suzanne Galloway will have decreased stressors, improved coping mechanisms upon discharge from phase 2 cardiac rehab Suzanne Galloway will have decreased stressors, improved coping mechanisms upon discharge from phase 2 cardiac rehab Suzanne Galloway will have decreased stressors, improved coping mechanisms upon discharge from phase 2 cardiac rehab     Interventions Encouraged to attend Cardiac Rehabilitation for the exercise Encouraged to attend Cardiac Rehabilitation for the  exercise --     Continue Psychosocial Services  Follow up required by staff Follow up required by staff No Follow up required     Comments Bailei voices being depressed over her recent hospitalization times 2 Naela returned to exercise after recently having COVID 19 --       Initial Review   Source of Stress Concerns Chronic Illness Chronic Illness Chronic Illness            Psychosocial Discharge (Final Psychosocial Re-Evaluation):  Psychosocial Re-Evaluation - 09/06/20 1318      Psychosocial Re-Evaluation   Current issues with Current Stress Concerns;Current Anxiety/Panic;Current Depression    Comments Suzanne Galloway has not voiced ahy increased stressors or concerns    Expected Outcomes Suzanne Galloway will have decreased stressors, improved coping mechanisms upon discharge from phase 2 cardiac rehab    Continue Psychosocial Services  No Follow up required      Initial Review   Source of Stress Concerns Chronic Illness           Vocational Rehabilitation: Provide vocational rehab assistance to qualifying candidates.   Vocational Rehab Evaluation & Intervention:  Vocational Rehab - 05/23/20 1554      Initial Vocational Rehab Evaluation & Intervention   Assessment shows need for Vocational Rehabilitation No   Suzanne Galloway is retired and does not need vocational rehab at this time          Education: Education Goals: Education classes will be provided on a weekly basis, covering required topics. Participant will state understanding/return demonstration of topics presented.  Learning Barriers/Preferences:  Learning Barriers/Preferences - 05/23/20 1533      Learning Barriers/Preferences   Learning Barriers Hearing;Sight    Learning Preferences Written Material;Skilled Demonstration;Individual Instruction  Education Topics: Hypertension, Hypertension Reduction -Define heart disease and high blood pressure. Discus how high blood pressure affects the body and ways to reduce high  blood pressure.   Exercise and Your Heart -Discuss why it is important to exercise, the FITT principles of exercise, normal and abnormal responses to exercise, and how to exercise safely.   Angina -Discuss definition of angina, causes of angina, treatment of angina, and how to decrease risk of having angina.   Cardiac Medications -Review what the following cardiac medications are used for, how they affect the body, and side effects that may occur when taking the medications.  Medications include Aspirin, Beta blockers, calcium channel blockers, ACE Inhibitors, angiotensin receptor blockers, diuretics, digoxin, and antihyperlipidemics.   Congestive Heart Failure -Discuss the definition of CHF, how to live with CHF, the signs and symptoms of CHF, and how keep track of weight and sodium intake.   Heart Disease and Intimacy -Discus the effect sexual activity has on the heart, how changes occur during intimacy as we age, and safety during sexual activity.   Smoking Cessation / COPD -Discuss different methods to quit smoking, the health benefits of quitting smoking, and the definition of COPD.   Nutrition I: Fats -Discuss the types of cholesterol, what cholesterol does to the heart, and how cholesterol levels can be controlled.   Nutrition II: Labels -Discuss the different components of food labels and how to read food label   Heart Parts/Heart Disease and PAD -Discuss the anatomy of the heart, the pathway of blood circulation through the heart, and these are affected by heart disease.   Stress I: Signs and Symptoms -Discuss the causes of stress, how stress may lead to anxiety and depression, and ways to limit stress.   Stress II: Relaxation -Discuss different types of relaxation techniques to limit stress.   Warning Signs of Stroke / TIA -Discuss definition of a stroke, what the signs and symptoms are of a stroke, and how to identify when someone is having  stroke.   Knowledge Questionnaire Score:  Knowledge Questionnaire Score - 05/23/20 1533      Knowledge Questionnaire Score   Pre Score 18/24           Core Components/Risk Factors/Patient Goals at Admission:  Personal Goals and Risk Factors at Admission - 05/24/20 0817      Core Components/Risk Factors/Patient Goals on Admission    Weight Management Weight Maintenance;Yes    Intervention Weight Management: Develop a combined nutrition and exercise program designed to reach desired caloric intake, while maintaining appropriate intake of nutrient and fiber, sodium and fats, and appropriate energy expenditure required for the weight goal.;Weight Management: Provide education and appropriate resources to help participant work on and attain dietary goals.    Expected Outcomes Short Term: Continue to assess and modify interventions until short term weight is achieved;Weight Maintenance: Understanding of the daily nutrition guidelines, which includes 25-35% calories from fat, 7% or less cal from saturated fats, less than 200mg  cholesterol, less than 1.5gm of sodium, & 5 or more servings of fruits and vegetables daily;Understanding recommendations for meals to include 15-35% energy as protein, 25-35% energy from fat, 35-60% energy from carbohydrates, less than 200mg  of dietary cholesterol, 20-35 gm of total fiber daily;Understanding of distribution of calorie intake throughout the day with the consumption of 4-5 meals/snacks    Hypertension Yes    Intervention Provide education on lifestyle modifcations including regular physical activity/exercise, weight management, moderate sodium restriction and increased consumption of fresh fruit, vegetables,  and low fat dairy, alcohol moderation, and smoking cessation.;Monitor prescription use compliance.    Expected Outcomes Short Term: Continued assessment and intervention until BP is < 140/84mm HG in hypertensive participants. < 130/45mm HG in hypertensive  participants with diabetes, heart failure or chronic kidney disease.;Long Term: Maintenance of blood pressure at goal levels.    Lipids Yes    Intervention Provide education and support for participant on nutrition & aerobic/resistive exercise along with prescribed medications to achieve LDL 70mg , HDL >40mg .    Expected Outcomes Short Term: Participant states understanding of desired cholesterol values and is compliant with medications prescribed. Participant is following exercise prescription and nutrition guidelines.;Long Term: Cholesterol controlled with medications as prescribed, with individualized exercise RX and with personalized nutrition plan. Value goals: LDL < 70mg , HDL > 40 mg.    Stress Yes    Intervention Offer individual and/or small group education and counseling on adjustment to heart disease, stress management and health-related lifestyle change. Teach and support self-help strategies.;Refer participants experiencing significant psychosocial distress to appropriate mental health specialists for further evaluation and treatment. When possible, include family members and significant others in education/counseling sessions.    Expected Outcomes Short Term: Participant demonstrates changes in health-related behavior, relaxation and other stress management skills, ability to obtain effective social support, and compliance with psychotropic medications if prescribed.;Long Term: Emotional wellbeing is indicated by absence of clinically significant psychosocial distress or social isolation.           Core Components/Risk Factors/Patient Goals Review:   Goals and Risk Factor Review    Row Name 05/27/20 0959 05/29/20 0951 08/12/20 1227 09/06/20 1319       Core Components/Risk Factors/Patient Goals Review   Personal Goals Review Weight Management/Obesity;Hypertension;Lipids;Stress Weight Management/Obesity;Hypertension;Lipids;Stress Weight Management/Obesity;Hypertension;Lipids;Stress  Weight Management/Obesity;Hypertension;Lipids;Stress    Review Suzanne Galloway started exercise on 05/27/20 and did well with exercise on her first day Suzanne Galloway is off to a good start to exercise. Suzanne Galloway returned to exercise on 08/12/20 after being out with COVID 19. Suzanne Galloway did well with exercise today Suzanne Galloway has been doing well with exercise at cardiac rehab. Suzanne Galloway's vital signs ahve been stable.    Expected Outcomes Patient will continue to partcipate in phase 2 cardiac rehab for exercise, nutrition and lifestyle modifications Patient will continue to partcipate in phase 2 cardiac rehab for exercise, nutrition and lifestyle modifications Patient will continue to partcipate in phase 2 cardiac rehab for exercise, nutrition and lifestyle modifications Patient will continue to partcipate in phase 2 cardiac rehab for exercise, nutrition and lifestyle modifications. Suzanne Galloway will complete cardiac rehab on 09/11/20           Core Components/Risk Factors/Patient Goals at Discharge (Final Review):   Goals and Risk Factor Review - 09/06/20 1319      Core Components/Risk Factors/Patient Goals Review   Personal Goals Review Weight Management/Obesity;Hypertension;Lipids;Stress    Review Suzanne Galloway has been doing well with exercise at cardiac rehab. Cearra's vital signs ahve been stable.    Expected Outcomes Patient will continue to partcipate in phase 2 cardiac rehab for exercise, nutrition and lifestyle modifications. Jaylynn will complete cardiac rehab on 09/11/20           ITP Comments:  ITP Comments    Row Name 05/23/20 1521 05/29/20 0944 08/12/20 1222 09/06/20 1316     ITP Comments Dr. Fransico Him, Medical Director 30 Day ITP Review. Rashia is off to a good start to exercise at cardiac rehab 30 Day ITP Review. Nicosha started restarted exercise on 08/12/20 and  did well with exercise 30 Day ITP Review. Kamera has good attendance and participation in phase 2 cardiac rehab. Gerrie will complete cardiac rehab on 09/11/20            Comments: See ITP Comments.Barnet Pall, RN,BSN 09/10/2020 8:55 AM

## 2020-09-10 ENCOUNTER — Telehealth: Payer: Self-pay | Admitting: Internal Medicine

## 2020-09-10 NOTE — Telephone Encounter (Signed)
Please advise on chest xray. 

## 2020-09-10 NOTE — Telephone Encounter (Signed)
Results Inquiry:    Patient calling for the results of X-rays on her heart. The patient would like a phone call back.Please advise        Kerry Kass   [facility]   [facility]

## 2020-09-10 NOTE — Telephone Encounter (Signed)
Please inform chest x ray is negative for actue finding. 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 Usually not concerning but  Can contribute to shortness of breath as decreases lung volume. Follow up with PCP non urgently if has more questions.

## 2020-09-11 ENCOUNTER — Other Ambulatory Visit: Payer: Self-pay

## 2020-09-11 ENCOUNTER — Encounter (HOSPITAL_COMMUNITY)
Admission: RE | Admit: 2020-09-11 | Discharge: 2020-09-11 | Disposition: A | Discharge status: Home / Self Care | Payer: Medicare Other | Source: Ambulatory Visit | Attending: Cardiology | Admitting: Cardiology

## 2020-09-11 DIAGNOSIS — I2102 ST elevation (STEMI) myocardial infarction involving left anterior descending coronary artery: Secondary | ICD-10-CM

## 2020-09-11 DIAGNOSIS — Z955 Presence of coronary angioplasty implant and graft: Secondary | ICD-10-CM

## 2020-09-11 NOTE — Telephone Encounter (Signed)
Patient identification verified x3.  Patient scheduled for Mon 09/16/2020 as per MD.    Leodis Sias MA

## 2020-09-11 NOTE — Progress Notes (Signed)
Discharge Progress Report  Patient Details  Name: Suzanne Galloway MRN: [id number] Date of Birth: Oct 26, 1940 Referring Provider:   Flowsheet Row CARDIAC REHAB PHASE II ORIENTATION from 05/23/2020 in [place]  Referring Provider Dr Herbie Baltimore Preli-Dr Fransico Him MD, Covering       Number of Visits: 15  Reason for Discharge:  Patient reached a stable level of exercise. Patient has met program and personal goals.  Smoking History:  Social History   Tobacco Use  Smoking Status Never Smoker  Smokeless Tobacco Never Used    Diagnosis:  03/10/20 STEMI, DES LAD   ST elevation myocardial infarction involving left anterior descending (LAD) coronary artery [facility])  ADL UCSD:   Initial Exercise Prescription:  Initial Exercise Prescription - 05/23/20 1500      Date of Initial Exercise RX and Referring Provider   Date 05/23/20    Referring Provider Dr Herbie Baltimore Preli-Dr Fransico Him MD, Covering    Expected Discharge Date 07/19/20      Bike   Level 1.5    Watts 20    Minutes 15    METs 2.7      NuStep   Level 2    SPM 75    Minutes 15    METs 2.5      Prescription Details   Frequency (times per week) 3x    Duration Progress to 10 minutes continuous walking  at current work load and total walking time to 30-45 min      Intensity   THRR 40-80% of Max Heartrate 57-114    Ratings of Perceived Exertion 11-13    Perceived Dyspnea 0-4      Progression   Progression Continue progressive overload as per policy without signs/symptoms or physical distress.      Resistance Training   Training Prescription Yes    Weight 3lbs    Reps 10-15           Discharge Exercise Prescription (Final Exercise Prescription Changes):  Exercise Prescription Changes - 09/11/20 1030      Response to Exercise   Blood Pressure (Admit) 114/60    Blood Pressure (Exercise) 122/72    Blood Pressure (Exit) 120/62    Heart Rate (Admit) 69 bpm    Heart Rate (Exercise) 97  bpm    Heart Rate (Exit) 73 bpm    Rating of Perceived Exertion (Exercise) 11    Perceived Dyspnea (Exercise) 0    Symptoms None    Comments Pt graduated from the CRP2 program    Duration Continue with 30 min of aerobic exercise without signs/symptoms of physical distress.    Intensity THRR unchanged      Progression   Progression Continue to progress workloads to maintain intensity without signs/symptoms of physical distress.    Average METs 3.2      Resistance Training   Training Prescription No    Weight --   No weights on Wednesday     Interval Training   Interval Training No      Treadmill   MPH 2.5    Grade 3    Minutes 15    METs 3.71      NuStep   Level 4    SPM 85    Minutes 15    METs 2.6      Home Exercise Plan   Plans to continue exercise at Home (comment)    Frequency Add 3 additional days to program exercise sessions.    Initial  Home Exercises Provided 08/28/20           Functional Capacity:  6 Minute Walk    Row Name 05/23/20 1521 09/09/20 0850       6 Minute Walk   Phase Initial Discharge    Distance 1520 feet 1674 feet    Distance % Change -- 10.13 %    Distance Feet Change -- 154 ft    Walk Time 6 minutes 6 minutes    # of Rest Breaks 0 0    MPH 2.9 3.2    METS 3.1 3.4    RPE 12 10    Perceived Dyspnea  0 0    VO2 Peak 10.7 11.8    Symptoms No No    Resting HR 67 bpm 74 bpm    Resting BP 118/62 122/70    Resting Oxygen Saturation  99 % --    Exercise Oxygen Saturation  during 6 min walk 98 % --    Max Ex. HR 102 bpm 110 bpm    Max Ex. BP 140/60 140/80    2 Minute Post BP 120/70 --           Psychological, QOL, Others - Outcomes: PHQ 2/9: Depression screen [facility] 2/9 09/11/2020 05/27/2020 05/23/2020 04/06/2020 06/11/2017  Decreased Interest 0 0 0 1 1  Down, Depressed, Hopeless 0 1 1 1 1   PHQ - 2 Score 0 1 1 2 2   Altered sleeping - - - 0 0  Tired, decreased energy - - - 1 2  Change in appetite - - - 0 0  Feeling bad or failure  about yourself  - - - 0 0  Trouble concentrating - - - 1 1  Moving slowly or fidgety/restless - - - 0 0  Suicidal thoughts - - - 0 0  PHQ-9 Score - - - 4 5  Difficult doing work/chores - - - Not difficult at all Not difficult at all    Quality of Life:  Quality of Life - 09/11/20 1200      Quality of Life Scores   Health/Function Post 29.14 %    Socioeconomic Post 28.5 %    Psych/Spiritual Post 30 %    Family Post 30 %    GLOBAL Post 29.35 %           Personal Goals: Goals established at orientation with interventions provided to work toward goal.  Personal Goals and Risk Factors at Admission - 05/24/20 0817      Core Components/Risk Factors/Patient Goals on Admission    Weight Management Weight Maintenance;Yes    Intervention Weight Management: Develop a combined nutrition and exercise program designed to reach desired caloric intake, while maintaining appropriate intake of nutrient and fiber, sodium and fats, and appropriate energy expenditure required for the weight goal.;Weight Management: Provide education and appropriate resources to help participant work on and attain dietary goals.    Expected Outcomes Short Term: Continue to assess and modify interventions until short term weight is achieved;Weight Maintenance: Understanding of the daily nutrition guidelines, which includes 25-35% calories from fat, 7% or less cal from saturated fats, less than 224m cholesterol, less than 1.5gm of sodium, & 5 or more servings of fruits and vegetables daily;Understanding recommendations for meals to include 15-35% energy as protein, 25-35% energy from fat, 35-60% energy from carbohydrates, less than 2014mof dietary cholesterol, 20-35 gm of total fiber daily;Understanding of distribution of calorie intake throughout the day with the consumption of 4-5 meals/snacks  Hypertension Yes    Intervention Provide education on lifestyle modifcations including regular physical activity/exercise,  weight management, moderate sodium restriction and increased consumption of fresh fruit, vegetables, and low fat dairy, alcohol moderation, and smoking cessation.;Monitor prescription use compliance.    Expected Outcomes Short Term: Continued assessment and intervention until BP is < 140/15m HG in hypertensive participants. < 130/861mHG in hypertensive participants with diabetes, heart failure or chronic kidney disease.;Long Term: Maintenance of blood pressure at goal levels.    Lipids Yes    Intervention Provide education and support for participant on nutrition & aerobic/resistive exercise along with prescribed medications to achieve LDL <7023mHDL >104m72m  Expected Outcomes Short Term: Participant states understanding of desired cholesterol values and is compliant with medications prescribed. Participant is following exercise prescription and nutrition guidelines.;Long Term: Cholesterol controlled with medications as prescribed, with individualized exercise RX and with personalized nutrition plan. Value goals: LDL < 70mg67mL > 40 mg.    Stress Yes    Intervention Offer individual and/or small group education and counseling on adjustment to heart disease, stress management and health-related lifestyle change. Teach and support self-help strategies.;Refer participants experiencing significant psychosocial distress to appropriate mental health specialists for further evaluation and treatment. When possible, include family members and significant others in education/counseling sessions.    Expected Outcomes Short Term: Participant demonstrates changes in health-related behavior, relaxation and other stress management skills, ability to obtain effective social support, and compliance with psychotropic medications if prescribed.;Long Term: Emotional wellbeing is indicated by absence of clinically significant psychosocial distress or social isolation.            Personal Goals Discharge:  Goals and  Risk Factor Review    Row Name 05/27/20 0959 05/29/20 0951 08/12/20 1227 09/06/20 1319 09/27/20 1443     Core Components/Risk Factors/Patient Goals Review   Personal Goals Review Weight Management/Obesity;Hypertension;Lipids;Stress Weight Management/Obesity;Hypertension;Lipids;Stress Weight Management/Obesity;Hypertension;Lipids;Stress Weight Management/Obesity;Hypertension;Lipids;Stress Weight Management/Obesity;Hypertension;Lipids;Stress   Review DonnaCheriseted exercise on 05/27/20 and did well with exercise on her first day DonnaRoyaleff to a good start to exercise. DonnaTimiyarned to exercise on 08/12/20 after being out with COVID 19. DonnaJasminawell with exercise today DonnaAbiolabeen doing well with exercise at cardiac rehab. Zaley's vital signs ahve been stable. DonnaJeritaleted exercise at cardiac rehab on 09/11/20.   Expected Outcomes Patient will continue to partcipate in phase 2 cardiac rehab for exercise, nutrition and lifestyle modifications Patient will continue to partcipate in phase 2 cardiac rehab for exercise, nutrition and lifestyle modifications Patient will continue to partcipate in phase 2 cardiac rehab for exercise, nutrition and lifestyle modifications Patient will continue to partcipate in phase 2 cardiac rehab for exercise, nutrition and lifestyle modifications. DonnaCheronda complete cardiac rehab on 09/11/20 DonnaAllyne continue to exercicse follow lifestyle and nutrtion  modifications upon completion of phase 2 cardiac rehab.          Exercise Goals and Review:  Exercise Goals    Row Name 05/23/20 1522             Exercise Goals   Increase Physical Activity Yes       Intervention Provide advice, education, support and counseling about physical activity/exercise needs.;Develop an individualized exercise prescription for aerobic and resistive training based on initial evaluation findings, risk stratification, comorbidities and participant's personal goals.       Expected  Outcomes Short Term: Attend rehab on a regular basis to increase amount of physical activity.;Long Term: Add in home  exercise to make exercise part of routine and to increase amount of physical activity.;Long Term: Exercising regularly at least 3-5 days a week.       Increase Strength and Stamina Yes       Intervention Provide advice, education, support and counseling about physical activity/exercise needs.;Develop an individualized exercise prescription for aerobic and resistive training based on initial evaluation findings, risk stratification, comorbidities and participant's personal goals.       Expected Outcomes Short Term: Increase workloads from initial exercise prescription for resistance, speed, and METs.;Short Term: Perform resistance training exercises routinely during rehab and add in resistance training at home;Long Term: Improve cardiorespiratory fitness, muscular endurance and strength as measured by increased METs and functional capacity (6MWT)       Able to understand and use rate of perceived exertion (RPE) scale Yes       Intervention Provide education and explanation on how to use RPE scale       Expected Outcomes Short Term: Able to use RPE daily in rehab to express subjective intensity level;Long Term:  Able to use RPE to guide intensity level when exercising independently       Knowledge and understanding of Target Heart Rate Range (THRR) Yes       Intervention Provide education and explanation of THRR including how the numbers were predicted and where they are located for reference       Expected Outcomes Short Term: Able to state/look up THRR;Long Term: Able to use THRR to govern intensity when exercising independently;Short Term: Able to use daily as guideline for intensity in rehab       Able to check pulse independently Yes       Intervention Provide education and demonstration on how to check pulse in carotid and radial arteries.;Review the importance of being able to check  your own pulse for safety during independent exercise       Expected Outcomes Short Term: Able to explain why pulse checking is important during independent exercise;Long Term: Able to check pulse independently and accurately       Understanding of Exercise Prescription Yes       Intervention Provide education, explanation, and written materials on patient's individual exercise prescription       Expected Outcomes Short Term: Able to explain program exercise prescription;Long Term: Able to explain home exercise prescription to exercise independently              Exercise Goals Re-Evaluation:  Exercise Goals Re-Evaluation    Row Name 05/27/20 1022 08/12/20 1037 08/28/20 1023 09/11/20 1030       Exercise Goal Re-Evaluation   Exercise Goals Review Able to understand and use rate of perceived exertion (RPE) scale;Knowledge and understanding of Target Heart Rate Range (THRR);Understanding of Exercise Prescription Increase Physical Activity;Increase Strength and Stamina;Able to understand and use rate of perceived exertion (RPE) scale;Knowledge and understanding of Target Heart Rate Range (THRR);Able to check pulse independently;Understanding of Exercise Prescription Increase Physical Activity;Increase Strength and Stamina;Able to understand and use rate of perceived exertion (RPE) scale;Knowledge and understanding of Target Heart Rate Range (THRR);Able to check pulse independently;Understanding of Exercise Prescription Increase Physical Activity;Increase Strength and Stamina;Able to understand and use rate of perceived exertion (RPE) scale;Knowledge and understanding of Target Heart Rate Range (THRR);Able to check pulse independently;Understanding of Exercise Prescription    Comments Pt's first day of exercise. Pt oriented to exercise equipment and prescription. Pt able to exercise 30 minutes with minimal difficulty. Pt responded well to exercise. Pt  is able to exercise for 30 minutes with no difficulty.  Reviewed home exercise Rx with patient. Pt is progressing well with no complaints. Pt verbalize understanding of home exercise Rx and was provided copy. Pt graduated from the [place] program today. Pt made good progress in the CRP2 progam and had an average MET level of 3.2. The plans to continue exercsing at home on her treadmill and bike.    Expected Outcomes Pt will continue to increase cardiovascular fitness. Will continue to monitor. Pt will increase strength and stamina. Pt will also work to increase comfort level on treadmill. Pt will exercise at home by either, walking, using Treadmill, or bike 2x/week for 30 minutes. Pt will continue to exercise at home on her own.           Nutrition & Weight - Outcomes:  Pre Biometrics - 05/23/20 1523      Pre Biometrics   Height 5' 2.5" (1.588 m)    Weight 58.9 kg    Waist Circumference 34.5 inches    Hip Circumference 38 inches    Waist to Hip Ratio 0.91 %    BMI (Calculated) 23.36    Triceps Skinfold 11 mm    % Body Fat 32.7 %    Grip Strength 25 kg    Flexibility 13 in    Single Leg Stand 20.43 seconds           Post Biometrics - 09/09/20 0835       Post  Biometrics   Height 5' 2.5" (1.588 m)    Weight 60.1 kg    Waist Circumference 35 inches    Hip Circumference 37.5 inches    Waist to Hip Ratio 0.93 %    BMI (Calculated) 23.83    Triceps Skinfold 11 mm    % Body Fat 33.1 %    Grip Strength 28 kg    Flexibility 12 in    Single Leg Stand 21.06 seconds           Nutrition:  Nutrition Therapy & Goals - 08/14/20 1012      Nutrition Therapy   Diet Heart Healthy    Drug/Food Interactions Statins/Certain Fruits      Personal Nutrition Goals   Nutrition Goal Pt to identify food quantities necessary to maintain weight between 125-130 lbs    Personal Goal #2 Pt to build a healthy plate including vegetables, fruits, whole grains, and low-fat dairy products in a heart healthy meal plan.      Intervention Plan   Intervention  Prescribe, educate and counsel regarding individualized specific dietary modifications aiming towards targeted core components such as weight, hypertension, lipid management, diabetes, heart failure and other comorbidities.    Expected Outcomes Short Term Goal: Understand basic principles of dietary content, such as calories, fat, sodium, cholesterol and nutrients.           Nutrition Discharge:  Nutrition Assessments - 09/19/20 0851      MEDFICTS Scores   Post Score 6           Education Questionnaire Score:  Knowledge Questionnaire Score - 09/11/20 1200      Knowledge Questionnaire Score   Post Score 22/24           Goals reviewed with patient; copy given to patient.Pt graduated from cardiac rehab program today with completion of 15 exercise sessions in Phase II. Pt maintained good attendance and progressed nicely during her participation in rehab as evidenced by increased MET level upon returning  due to a COVID 19 infection.   Medication list reconciled. Repeat  PHQ score- 0 .  Pt has made significant lifestyle changes and should be commended for her success. Pt feels she has achieved her goals during cardiac rehab.   Pt plans to continue exercise by walking on her treadmill and using her bike at home.Jameila increased her distance on her post exercise walk test by 154. We are proud of Tyronza's progress! feet.Barnet Pall, RN,BSN 09/27/2020 2:45 PM

## 2020-09-16 ENCOUNTER — Ambulatory Visit: Payer: Medicare Other | Admitting: Internal Medicine

## 2020-09-16 ENCOUNTER — Telehealth: Payer: Self-pay | Admitting: Internal Medicine

## 2020-09-16 DIAGNOSIS — R0602 Shortness of breath: Secondary | ICD-10-CM

## 2020-09-16 NOTE — Progress Notes (Signed)
I performed a virtual check with the patient via telephone for chest x-ray results.  I obtained verbal consent from the patient to perform this clinical encounter    Time spent on discussion    Chief complaint -chest x-ray results      Her chest x-ray reviewed-  Normal heart and mediastinum. Small apical fat.    Eventration/elevation of the anterior right hemidiaphragm persists. No  evidence of pleural fluid or edema. Spinal degenerative change.    IMPRESSION:  1. No acute findings    She does have  shortness of breath  especially walking up the stairs and walking less than a block denies any chest pain, cough, wheezing, palpitations or sweating.  She does complain of generalized weakness and fatigue     Objective-  General Appearance: healthy, alert, no distress, pleasant affect, cooperative.  Mental Status: Appearance/Cooperation: in no apparent distress and well groomed and dressed, pleasant attitude and normal behavior with a normal eye contact        ASSESMENT AND PLAN     (R06.02) SOB (shortness of breath)  (primary encounter diagnosis)  Comment:   Plan: Advised to to get lung function test and see a pulmonologist she is not interested she does have a upcoming appointment with Dr. Doreatha Martin     I did review patient's past medical and family/social history, no changes noted.  Barriers to Learning assessed: none. Patient verbalizes understanding of teaching and instructions.  Zettie Cooley MD      Electronically Signed By:  Zettie Cooley, MD  Physician associate   [facility] [place]  [phone]

## 2020-09-16 NOTE — Telephone Encounter (Signed)
I have attempted to contact this patient by phone with the following results: message left to return my call with Spouse      When patient calls back please schedule US carotid duplex.this can be done at [facility] location       Candace Braswell  [facility]   [place] [facility]

## 2020-09-17 ENCOUNTER — Telehealth: Payer: Self-pay | Admitting: Internal Medicine

## 2020-09-17 ENCOUNTER — Ambulatory Visit
Admission: RE | Admit: 2020-09-17 | Discharge: 2020-09-17 | Disposition: A | Discharge status: Home / Self Care | Payer: Medicare Other | Source: Ambulatory Visit | Attending: Internal Medicine | Admitting: Internal Medicine

## 2020-09-17 ENCOUNTER — Ambulatory Visit (INDEPENDENT_AMBULATORY_CARE_PROVIDER_SITE_OTHER): Payer: Medicare Other

## 2020-09-17 DIAGNOSIS — R0602 Shortness of breath: Secondary | ICD-10-CM

## 2020-09-17 DIAGNOSIS — I6529 Occlusion and stenosis of unspecified carotid artery: Secondary | ICD-10-CM

## 2020-09-17 DIAGNOSIS — I7090 Unspecified atherosclerosis: Secondary | ICD-10-CM | POA: Insufficient documentation

## 2020-09-17 LAB — D-DIMER
D-Dimer VTE Cut-off: 359 ng/mL DDU
D-Dimer: 324 ng/mL — ABNORMAL HIGH (ref 0–230)

## 2020-09-17 NOTE — Telephone Encounter (Signed)
Ct chest

## 2020-09-18 ENCOUNTER — Telehealth: Payer: Self-pay | Admitting: Internal Medicine

## 2020-09-18 DIAGNOSIS — N289 Disorder of kidney and ureter, unspecified: Secondary | ICD-10-CM

## 2020-09-18 NOTE — Telephone Encounter (Signed)
Results Inquiry:    Patient calling regarding  the results of CT done at [facility] yesterday.   The patient happy to report that all went well, no blood clots.  If PCP has any questions please contact her.     [Notify that results for tests done at [facility] will be auto released on MyChart once they are finalized - lab and X-ray results are released after 1-2 days; surgical pathology results after 14 days. Tests done outside of [place] will not be available in MyChart as they are not entered into our EMR but scanned in as documents]

## 2020-09-18 NOTE — Telephone Encounter (Signed)
Shows via care everywhere with Kaylyn Lim from yesterday in chart

## 2020-09-18 NOTE — Telephone Encounter (Signed)
Happy to hear this but I do not see any results/ where did she have it done? [facility]?

## 2020-09-19 NOTE — Telephone Encounter (Signed)
Let her know no blood clot in the lungs she does have lung nodules  for which  needs a follow-up with a CT chest  in 1 year and  did have incomplete evaluation of her kidneys there could be cyst /lesion in it I have ordered a renal ultrasound to evaluate it further

## 2020-09-19 NOTE — Telephone Encounter (Signed)
Called patient, ID verified x 3.  Dr. Hundal's message given.  Patient verbalized understanding.      Tammy [name]

## 2020-09-19 NOTE — Telephone Encounter (Signed)
Patient is scheduled for renal ultrasound on 09/24/20.    Tammy [name]

## 2020-09-20 DIAGNOSIS — R197 Diarrhea, unspecified: Secondary | ICD-10-CM | POA: Diagnosis not present

## 2020-09-20 DIAGNOSIS — R52 Pain, unspecified: Secondary | ICD-10-CM | POA: Diagnosis not present

## 2020-09-20 DIAGNOSIS — Z03818 Encounter for observation for suspected exposure to other biological agents ruled out: Secondary | ICD-10-CM | POA: Diagnosis not present

## 2020-09-20 DIAGNOSIS — R509 Fever, unspecified: Secondary | ICD-10-CM | POA: Diagnosis not present

## 2020-09-20 DIAGNOSIS — R059 Cough, unspecified: Secondary | ICD-10-CM | POA: Diagnosis not present

## 2020-09-22 NOTE — Progress Notes (Deleted)
Name: Tammy [name]  Date: ***  Sex: female   Age: 74yr    MR#: [id number]   Ref Dr: Matilde Haymaker, MD        HPI [name] ***, seen in consult by the request of Dr. Marland Kitchen for evaluation of ***.   Is there any history of heart problems?   Previous bypass surgery, stent, MI or heart failure?   CVFC *** Treadmill?      Chronic illnesses update   ***  Hyperlipidemia  History of PE: On April 04, 2009, patient was lifting some heavy boxes and experienced right-sided chest pain with shortness of breath.  When she pressed on it, it felt better.  Patient thought it was musculoskeletal in component.  On April 05, 2009, patient noted increase in chest pain symptoms and shortness of breath.  Patient traveled to [facility] and hospitalized at [facility] after evaluation showed patient to be in atrial fibrillation with rapid ventricular response and CT of the chest showed pulmonary embolism the medial base of the right lower lobe segment, pulmonary artery, right lower lobe atelectasis, and old infiltrate and right pleural effusion.  Patient was converted to sinus rhythm with intravenous Cardizem and patient improved.  Patient's assessment had [facility] in [place], [place], by the admitting physician was that the pulmonary emboli most likely was secondary to possible recent flight versus underlying genetic disorder, underlying malignancy     CHART REVIEW AND UPDATE ***  Current cardiac medications:   Atorvastatin 40 mg/day  Clopidogrel 75 mg/day  /  Previous cardiac medications:   ***  /  Medication compliance: ***  Anticoagulants: ***  Carotid duplex scan  09/17/2020  1. Trace atherosclerotic disease, no significant stenosis.    Stimulants  Coffee ***; Tea ***; Cola ***; Other ***    Smoking status: never smoker     Risk Factor for AF      Diabetes  No    HTN  No     Tobacco use Never smoker     High BMI 25     OSA ***    ETOH  ***    LVH  ***    Family History  ***    RV pacing  No     .  Risk Factor for CAD       DM  No    HTN  No       Tobacco  Never smoker     High BMI 25     Hyperlipidemia  Yes     Family Hx of premature CAD  ***    PAD  ***    Sedentary?  ***    .  .    Patient Active Problem List   Diagnosis    Hypothyroidism    Routine general medical examination at a health care facility    [facility] and fatigue    left supraclavicular fullness    Loss of weight    Pure hypercholesterolemia    Pulmonary embolism (HCC)    Atrial fibrillation (HCC)    Long term current use of anticoagulant therapy    PE (physical exam)    BMI 28.0-28.9,adult    Vitamin D deficiency       Allergies:   Allergies   Allergen Reactions    Enoxaparin Rash     When she had PE, diffuse rash, biopsied       Medications:   Current Outpatient Medications   Medication  Sig    Atorvastatin (LIPITOR) 40 mg tablet Take 1 tablet by mouth every day.    Clopidogrel (PLAVIX) 75 mg Tablet Take 1 tablet by mouth every morning. (blood thinner)    LevoTHYROxine 112 mcg Tablet One pill daily (thyroid)     No current facility-administered medications for this visit.       Social History:   Social History     Socioeconomic History    Marital status: MARRIED     Spouse name: Not on file    Number of children: 2    Years of education: Not on file    Highest education level: Not on file   Occupational History    Occupation: used to work with animals   Tobacco Use    Smoking status: Never Smoker    Smokeless tobacco: Never Used   Substance and Sexual Activity    Alcohol use: No    Drug use: No    Sexual activity: Not on file   Other Topics Concern    Not on file   Social History Narrative    Not on file     Social Determinants of Health     Financial Resource Strain: Not on file   Food Insecurity: Not on file   Transportation Needs: Not on file   Physical Activity: Not on file   Stress: Not on file   Social Connections: Not on file   Intimate Partner Violence: Not on file   Housing Stability: Not on file     Past Medical History:   Past  Medical History:   Diagnosis Date    Herpes zoster without mention of complication 2004       Past Surgical History:   Past Surgical History:   Procedure Laterality Date    COLONOSCOPY      declined in past    NO SURGICAL HISTORY        Family History:   Family History   Problem Relation Name Age of Onset    Cancer Sister          oral    Heart Sister      Heart Mother         ROS:   Constitutional: weight ***.  Eyes: *** glasses.  Ears, Nose, Mouth, Throat: No sore throat, rhinorrhea, dysphagia..  CV: ***  Resp: No hemoptysis, valley fever.  GI: negative  history of GI bleeding, nausea, vomit, diarrhea.  GU: No history of renal colic, hematuria, nocturia.  Musculoskeletal: No back pain or joint pain  Integumentary: negative history of pruritus, photosensitivity.  Neuro: No history of seizures, focal neurological deficit, cognitive disorder.Marland Kitchen  Psych: Mood pt's report, euthymic.  Endo: No history of DM, has H/O hypothyrodism  Heme/Lymphatic: No history of blood dyscrasia, bruising or bleeding.  Allergy/Immun: Enoxaparin.      There were no vitals filed for this visit.    Physical Exam:  General Appearance:  General Appearance:10008::"healthy, alert, no distress, pleasant affect, cooperative".  Eyes:   Brief Eye Exam:10444::"conjunctivae and corneas clear. PERRL, EOM's intact. sclerae normal".  Ears:   Ear Exam Brief:10009::"normal TMs and canal","external inspection of ears show no abnormality".  Nose:   Nose Brief Exam:10011::"normal".  Mouth:  Oropharynx Brief Exam:10012::"normal".  Neck:   Neck Exam:10490::"Neck supple. No adenopathy, thyroid symmetric, normal size".  Heart:   Heart Exam:10535::"normal rate and regular rhythm, no murmurs, clicks, or gallops".  Lungs:  Lung Exam Brief:10013::"clear to auscultation".  Extremities:   Extremity Exam:10533::"no cyanosis, clubbing,  or edema".  Skin:   SKIN EXAM:10543::"Skin color, texture, turgor normal. No rashes or lesions".  Mental Status:  mental  status:11091    DATA REVIEWED  Is the lab up to date? ***  Labs:     BCP:   Lab Results   Lab Name Value Date/Time    NA 140 07/24/2020 07:22 AM    NA 139 05/09/2014 09:44 AM    K 3.9 07/24/2020 07:22 AM    K 3.9 05/09/2014 09:44 AM    CL 104 07/24/2020 07:22 AM    CL 106 05/09/2014 09:44 AM    CO2 28 07/24/2020 07:22 AM    CO2 27 05/09/2014 09:44 AM    BUN 15 07/24/2020 07:22 AM    BUN 15 05/09/2014 09:44 AM    CR 0.85 07/24/2020 07:22 AM    CR 0.69 05/09/2014 09:44 AM    GLU 90 07/24/2020 07:22 AM    GLU 96 05/09/2014 09:44 AM     LFT:   Lab Results   Lab Name Value Date/Time    AST 21 07/24/2020 07:22 AM    AST 25 05/09/2014 09:44 AM    ALT 18 07/24/2020 07:22 AM    ALT 18 05/09/2014 09:44 AM    ALP 65 07/24/2020 07:22 AM    ALP 44 05/09/2014 09:44 AM    ALB 3.6 07/24/2020 07:22 AM    ALB 3.7 05/09/2014 09:44 AM    TP 6.2 (L) 07/24/2020 07:22 AM    TP 6.5 05/09/2014 09:44 AM    TBIL 1.0 07/24/2020 07:22 AM    TBIL 0.7 05/09/2014 09:44 AM     CBC:   Lab Results   Lab Name Value Date/Time    WBC 5.6 07/24/2020 07:22 AM    WBC 7.3 05/09/2014 09:44 AM    HGB 13.9 07/24/2020 07:22 AM    HGB 13.8 05/09/2014 09:44 AM    HCT 41.3 07/24/2020 07:22 AM    HCT 42.1 05/09/2014 09:44 AM    PLT 239 07/24/2020 07:22 AM    PLT 302 05/09/2014 09:44 AM       Lipids:   Lab Results   Lab Name Value Date/Time    CHOL 182 07/24/2020 07:22 AM    CHOL 188 05/09/2014 09:44 AM    LDLC 105 07/24/2020 07:22 AM    LDLC 107 05/09/2014 09:44 AM    HDL 58 07/24/2020 07:22 AM    HDL 59 05/09/2014 09:44 AM    TRIG 95 07/24/2020 07:22 AM    TRIG 108 05/09/2014 09:44 AM     Thyroid: @LABTSH @      EKG     ***:    *** (tracing personally and independently reviewed)  Rhythm Strip: ***    MEDICAL-DECISION-MANAGEMENT:  Is the lab up to date? ***  ***     RTO for follow up and re evaluation ***    Instructions and list of medications printed and provided to the patient.     history review:10509   education:10548::"Barriers to Learning assessed: none.  Patient verbalizes understanding of teaching and instructions."  , MD  .

## 2020-09-24 ENCOUNTER — Ambulatory Visit
Admission: RE | Admit: 2020-09-24 | Discharge: 2020-09-24 | Disposition: A | Discharge status: Home / Self Care | Payer: Medicare Other | Source: Ambulatory Visit | Attending: Internal Medicine | Admitting: Internal Medicine

## 2020-09-24 DIAGNOSIS — N281 Cyst of kidney, acquired: Secondary | ICD-10-CM

## 2020-09-24 DIAGNOSIS — N289 Disorder of kidney and ureter, unspecified: Secondary | ICD-10-CM

## 2020-09-30 ENCOUNTER — Other Ambulatory Visit: Payer: Self-pay | Admitting: *Deleted

## 2020-09-30 NOTE — Patient Outreach (Signed)
[facility]) Care Management  09/30/2020  ORLEAN HOLTROP [birth year] [id number]  Telephone outreach to follow up on CAD.  Ms. Bayle returned my call. She reports she finished up her cardiac rehab on 09/11/20.   She had her COVID booster and Flu vaccine on 09/13/20.  She became ill the day after the vaccines with flu like sxs for about a week and was in bed for 5 days. She was seen at her primary care office and tested negative for both COVID and flu. She is finally reporting she is almost back to baseline.  She had been trying to find a mental health care therapist close to her either in [facility] or [place] without luck.  Goals Addressed            This Visit's Progress   . Patient Stated       Will call MD, PA or NP providers to report any new health issues early to avoid complications and hospitalizations. 09/30/20 Ms. Funches is always very good to reach out and discuss medical and or emotional issues for resolution.    . Patient Stated       Will call from a list of providers and make an appt with a mental health professional over the next 30 days.      We agreed to talk again in one month.  [name]. Myrtie Neither, MSN, [facility] Gerontological Nurse Practitioner [facility] Care Management [phone]

## 2020-10-01 ENCOUNTER — Ambulatory Visit: Payer: Medicare Other | Admitting: CARDIOLOGY

## 2020-10-03 ENCOUNTER — Telehealth: Payer: Self-pay | Admitting: Internal Medicine

## 2020-10-03 NOTE — Telephone Encounter (Signed)
Results Inquiry:    Patient calling for the results of Ultrasound done at [facility] last week. The patient would like a phone call back.        Thank you,  Constance Goltz III  Patient Contact Center  Ext: [phone]

## 2020-10-03 NOTE — Telephone Encounter (Signed)
Attempted to contact patient. Result note from Dr. Parks Neptune:      Let her know ultrasound of the kidney shows simple cysts no follow-up needed.

## 2020-10-07 ENCOUNTER — Ambulatory Visit: Payer: Medicare Other | Admitting: Family Medicine

## 2020-10-07 ENCOUNTER — Ambulatory Visit: Payer: Medicare Other | Admitting: CARDIOLOGY

## 2020-10-07 DIAGNOSIS — K2100 Gastro-esophageal reflux disease with esophagitis, without bleeding: Secondary | ICD-10-CM | POA: Diagnosis not present

## 2020-10-07 NOTE — Telephone Encounter (Signed)
General Advice / Message to MD:    Patient calling checking status of [place] results.    Message below related to patient, patient verbalized understanding.    Tammy Lizardo G. [name]  Patient Contact Center

## 2020-10-14 ENCOUNTER — Ambulatory Visit: Payer: Medicare Other | Admitting: Family Medicine

## 2020-10-14 ENCOUNTER — Encounter: Payer: Self-pay | Admitting: Family Medicine

## 2020-10-14 VITALS — BP 130/75 | HR 67 | Ht 62.0 in | Wt 132.0 lb

## 2020-10-14 DIAGNOSIS — G40109 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, not intractable, without status epilepticus: Secondary | ICD-10-CM | POA: Diagnosis not present

## 2020-10-14 DIAGNOSIS — F32A Depression, unspecified: Secondary | ICD-10-CM

## 2020-10-14 DIAGNOSIS — R413 Other amnesia: Secondary | ICD-10-CM

## 2020-10-14 DIAGNOSIS — F419 Anxiety disorder, unspecified: Secondary | ICD-10-CM

## 2020-10-14 NOTE — Progress Notes (Signed)
Chief Complaint  Patient presents with  . Follow-up    Rm 3, with daughter, states memory is worse, reports an episode where she forgot who her family was, on 10/11/20     HISTORY OF PRESENT ILLNESS: Today 10/14/20  Suzanne Galloway is a 79 y.o. female here today for follow up for seizures. She continues zonisamide 200mg  at bedtime and [name] 50mg  BID. Last major seizure in 09/2017. She has continued to have deja vu spells but feels they are much less frequent than they were. She may have a spell 3-4 times a year. She continues escitalopram 20mg  daily and alprazolam 0.25mg  TID. Cardiac stent placed post STEMI in 03/10/2020. She contracted Covid over the summer which delayed cardiac rehab. She recently completed cardiac rehab. She continues to have concerns of memory loss. She has trouble finding words. She uses words inappropriately but immediately recognizes it. Her daughter is with her today and tells a story where [name] described a 14 second event where she was confused on 12/24. She asked herself "where was Remo Lipps", then asked herself "who is Remo Lipps" and then realized that Remo Lipps was standing in the room with her. Remo Lipps is her grandson. She denies similar events happening since or before this event. She lives with her youngest daughter and son in [place]. She manages her own finances. She doses her own medications. She is able to perform ADL's She is sleeping well. She feels mood is stable. She tried to wean alprazolam but feels anxiety worsened. She is scheduled to see a psychologist in January.   She is fully vaccinated with booster for Covid and flu.    HISTORY (copied from previous note)  UPDATE (10/03/19, VRP): Since last visit, more stress and tragedy from her friends medical issues. More panic and anxiety and stomach issues. Deja vu spells continue, slightly more so patient increased her zonisamide. Symptoms are otherwise stable.   UPDATE (05/29/19, VRP): Since last visit, doing poorly  according to family. Continues with depression and anxiety. Severity is moderate. No alleviating or aggravating factors. Tolerating meds. Weekly deja vu spells. No major seizures. Some intermittent memory loss.   UPDATE (08/10/18, VRP): Since last visit, struggling with anxiety and depression sxs. No def sz except mild deja vu spell 1 week ago. No alleviating or aggravating factors. Tolerating meds. Psychiatry has increased lexapro 1 month ago.  PRIOR HPI (02/01/18): 79 year old female here for evaluation of seizures.  Feb 17, 2012 was a stressful year for patient.  Patient was taking care of her husband who ultimately passed away.  Then in 02/16/2013 she began to have intermittent episodes of dj vu, anxiety, hot sensation in her body, a liquid sensation going down her chest and abdomen.  Episodes can last minutes at a time.  These occurred every few weeks.  Over time the increased in frequency.  These were attributed to anxiety and patient was treated with Lexapro, Wellbutrin and Ativan.  In 02/17/15 patient was evaluated by neurologist for tremor.  She also reported episodes of dj vu and therefore EEG was ordered.  No epileptiform discharges were found.  This is followed up with ambulatory EEG which was also normal.  May 2018 patient presented to the hospital with retrograde memory loss, patient thinking it was 17-Feb-2015, no recent memory of a beach trip recently, and other confusion.  Hospital EEG showed occasional sharp wave discharges in the right posterior frontal region.  Patient was then referred to [facility] for video EEG monitoring.  Occasional right temporal  sharp wave activity was noted.  Patient had 2 electrographic seizures.  Patient was started on Lamictal but then this was discontinued due to lack of effectiveness and possible side effect.  This was changed to oxcarbazepine but this caused paranoia and confusion.  Patient was then started on zonisamide.  Patient developed some tremor which was initially  thought to be anxiety related, but then possibly related to zonisamide, and therefore dose was slightly reduced.  Last event of possible seizure occurred 10/13/17, with memory lapse, confusion, dj vu sensation, anxiety sensation.  Patient currently on zonisamide 200 mg at bedtime for seizure prevention.  No significant tremors.  She is also on Lexapro 20 mg/day for anxiety and depression.  She is on lorazepam 0.5 mg twice a day for anxiety.  Patient still feels quite anxious and depressed at times, related to being stuck at home.  However when she is able to get out of the house she feels like going back home.  She is working with a Therapist, sports and psychologist now.  Patient then requested a second opinion with our office.   REVIEW OF SYSTEMS: Out of a complete 14 system review of symptoms, the patient complains only of the following symptoms, memory loss, anxiety and all other reviewed systems are negative.   ALLERGIES: Allergies  Allergen Reactions  . Codeine Other (See Comments)    "makes me crazy"  . Lamictal [Lamotrigine] Other (See Comments)    Increased siezure activity.  . Oxcarbazepine Other (See Comments)    Over sedation  . Symbicort [Budesonide-Formoterol Fumarate] Other (See Comments)    Had 2 "seizures" after taking.   . Atorvastatin   . Buspirone     Increased depression  . Carbamazepine Other (See Comments)    Paranoia, confusion  . Depakote [Divalproex Sodium] Nausea And Vomiting    lethargy  . Dilaudid [Hydromorphone] Other (See Comments)    Reaction:  Hallucinations   . Dulera [Mometasone Furo-Formoterol Fum] Other (See Comments)    Reaction:  Deja vu seizures   . Lactose Intolerance (Gi) Diarrhea  . Morphine And Related Nausea And Vomiting  . Percocet [Oxycodone-Acetaminophen] Other (See Comments)    Reaction:  Hallucinations   . Pravastatin Other (See Comments)    Reaction:  Muscle pain, "weird dreams"  . Vesicare [Solifenacin] Other (See  Comments)    Reaction:  Constipation   . Zonisamide Other (See Comments)    At higher doses can cause tremors, anxiety     HOME MEDICATIONS: Outpatient Medications Prior to Visit  Medication Sig Dispense Refill  . ALPRAZolam (XANAX) 0.25 MG tablet 1 tablet    . Ascorbic Acid (VITAMIN C PO) Take 1 tablet by mouth daily.    Marland Kitchen aspirin 81 MG chewable tablet Chew 81 mg by mouth in the morning and at bedtime.     Marland Kitchen atorvastatin (LIPITOR) 80 MG tablet Take 80 mg by mouth daily.    . Calcium Carbonate-Vitamin D 600-400 MG-UNIT tablet Take 1 tablet by mouth daily.    Marland Kitchen desvenlafaxine (PRISTIQ) 50 MG 24 hr tablet Take 1 tablet by mouth daily.    . diphenoxylate-atropine (LOMOTIL) 2.5-0.025 MG tablet 1 tablet as needed    . escitalopram (LEXAPRO) 20 MG tablet Take 20 mg by mouth daily.  11  . famotidine (PEPCID) 40 MG tablet Take 40 mg by mouth 2 (two) times daily.     . isosorbide mononitrate (IMDUR) 30 MG 24 hr tablet Take 30 mg by mouth daily.    Marland Kitchen lacosamide (  [name]) 50 MG TABS tablet Take 1 tablet (50 mg total) by mouth 2 (two) times daily. 180 tablet 4  . levothyroxine (SYNTHROID) 75 MCG tablet Take 75 mcg by mouth every morning.     . loperamide (IMODIUM A-D) 2 MG tablet Take 2 mg by mouth 4 (four) times daily as needed for diarrhea or loose stools.    . metoprolol succinate (TOPROL-XL) 25 MG 24 hr tablet Take 25 mg by mouth daily.    . Multiple Vitamins-Minerals (PRESERVISION AREDS PO) Take 1 tablet by mouth daily.     . nitroGLYCERIN (NITROSTAT) 0.4 MG SL tablet Place 0.4 mg under the tongue every 5 (five) minutes as needed for chest pain.    Vladimir Faster Glycol-Propyl Glycol (SYSTANE OP) Apply 1 drop to eye daily as needed.    . sacubitril-valsartan (ENTRESTO) 24-26 MG Take 1 tablet by mouth 2 (two) times daily.     Marland Kitchen spironolactone (ALDACTONE) 25 MG tablet Take 25 mg by mouth daily.    . sucralfate (CARAFATE) 1 g tablet Take 1 g by mouth 4 (four) times daily -  with meals and at bedtime.     . ticagrelor (BRILINTA) 90 MG TABS tablet Take 90 mg by mouth 2 (two) times daily.     Marland Kitchen zonisamide (ZONEGRAN) 100 MG capsule Take 2 capsules (200 mg total) by mouth at bedtime. 180 capsule 4   No facility-administered medications prior to visit.     PAST MEDICAL HISTORY: Past Medical History:  Diagnosis Date  . Anxiety   . Arthritis    "hands, back, toes" (01/01/2015)  . Asthma   . Chronic bronchitis ([place])    "get it just about q yr" (01/01/2015)  . Coronary artery disease    S/P STEMI 03/10/20, DES LAD, VFib Arrest  . Depression   . GERD (gastroesophageal reflux disease)   . Hearing loss    hearing aids  . High cholesterol   . Hyperlipemia   . Hypertension   . Hypothyroidism   . IBS (irritable bowel syndrome)   . Mitral valve prolapse   . Seizures [facility])    dx Aug 2018, most recent seizure 10/13/17, partial complex  . Urinary incontinence      PAST SURGICAL HISTORY: Past Surgical History:  Procedure Laterality Date  . BACK SURGERY    . CARDIAC CATHETERIZATION    . CARPAL TUNNEL RELEASE Bilateral 2008  . CYSTECTOMY Right 09/2014   "hand"   . DILATION AND CURETTAGE OF UTERUS    . FINGER SURGERY Bilateral    "scraped arthritis out of thumbs"  . INCONTINENCE SURGERY    . LAPAROSCOPIC CHOLECYSTECTOMY    . LUMBAR LAMINECTOMY  1987; 12/2009   ; Archie Endo 01/16/2010  . TOE SURGERY Left 09/21/2005   "took bone out of 2nd toe; took cyst out"  . TUBAL LIGATION    . VAGINAL HYSTERECTOMY  1970     FAMILY HISTORY: Family History  Problem Relation Age of Onset  . COPD Father        smoked  . Heart disease Father   . Esophageal cancer Father        smoked  . Transient ischemic attack Father   . Cancer Father        kidney  . Deep vein thrombosis Brother   . Parkinson's disease Mother   . Heart failure Mother   . Diabetes Mother   . Cancer Daughter   . Diabetes Maternal Grandmother   . Diabetes Paternal Grandfather  SOCIAL HISTORY: Social History    Socioeconomic History  . Marital status: Widowed    Spouse name: Not on file  . Number of children: 3  . Years of education: 2  . Highest education level: GED or equivalent  Occupational History  . Occupation: Retired    Comment: [employer] shop  Tobacco Use  . Smoking status: Never Smoker  . Smokeless tobacco: Never Used  Substance and Sexual Activity  . Alcohol use: Yes    Comment: 01/01/2015 "mixed drink q 2 months or so", 05/29/19 quit 2016  . Drug use: No  . Sexual activity: Not Currently  Other Topics Concern  . Not on file  Social History Narrative   10/03/19 Lives with dgtr, Erminia Mcnew and son-in-law    Retired, widowed   Education- high school   Caffeine 1 cup daily   Social Determinants of Health   Financial Resource Strain: Not on file  Food Insecurity: Not on file  Transportation Needs: Not on file  Physical Activity: Not on file  Stress: Not on file  Social Connections: Not on file  Intimate Partner Violence: Not on file      PHYSICAL EXAM  Vitals:   10/14/20 1019  BP: 130/75  Pulse: 67  Weight: 132 lb (59.9 kg)  Height: 5\' 2"  (1.575 m)   Body mass index is 24.14 kg/m.   Generalized: Well developed, in no acute distress  Cardiology: normal rate and rhythm, no murmur auscultated  Respiratory: clear to auscultation bilaterally    Neurological examination  Mentation: Alert oriented to time, place, history taking. Follows all commands speech and language fluent Cranial nerve II-XII: Pupils were equal round reactive to light. Extraocular movements were full, visual field were full on confrontational test. Facial sensation and strength were normal. Head turning and shoulder shrug  were normal and symmetric. Motor: The motor testing reveals 5 over 5 strength of all 4 extremities. Good symmetric motor tone is noted throughout.  Sensory: Sensory testing is intact to soft touch on all 4 extremities. No evidence of extinction is noted.  Coordination:  Cerebellar testing reveals good finger-nose-finger and heel-to-shin bilaterally.  Gait and station: Gait is normal.  Reflexes: Deep tendon reflexes are symmetric and normal bilaterally.     DIAGNOSTIC DATA (LABS, IMAGING, TESTING) - I reviewed patient records, labs, notes, testing and imaging myself where available.  Lab Results  Component Value Date   WBC 7.3 02/19/2017   HGB 12.5 02/19/2017   HCT 37.3 02/19/2017   MCV 92.1 02/19/2017   PLT 260 02/19/2017      Component Value Date/Time   NA 138 02/19/2017 0935   K 4.6 02/19/2017 0935   CL 101 02/19/2017 0935   CO2 28 02/19/2017 0935   GLUCOSE 112 (H) 02/19/2017 0935   BUN 14 02/19/2017 0935   CREATININE 0.87 02/19/2017 0935   CREATININE 0.82 12/27/2014 0714   CALCIUM 9.4 02/19/2017 0935   PROT 7.4 02/19/2017 0935   ALBUMIN 4.6 02/19/2017 0935   AST 28 02/19/2017 0935   ALT 21 02/19/2017 0935   ALKPHOS 69 02/19/2017 0935   BILITOT 0.4 02/19/2017 0935   GFRNONAA >60 02/19/2017 0935   GFRAA >60 02/19/2017 0935   Lab Results  Component Value Date   CHOL 174 01/02/2015   HDL 37 (L) 01/02/2015   LDLCALC 105 (H) 01/02/2015   TRIG 158 (H) 01/02/2015   CHOLHDL 4.7 01/02/2015   Lab Results  Component Value Date   HGBA1C 6.3 (H) 01/03/2015  Lab Results  Component Value Date   VITAMINB12 312 02/20/2017   Lab Results  Component Value Date   TSH 10.174 (H) 02/20/2017    MMSE - Mini Mental State Exam 10/14/2020 10/03/2019 05/29/2019  Not completed: - - -  Orientation to time 5 4 5   Orientation to Place 5 4 5   Registration 3 3 3   Attention/ Calculation 1 1 1   Recall 3 3 2   Language- name 2 objects 2 2 2   Language- repeat 1 1 1   Language- follow 3 step command 3 3 3   Language- read & follow direction 1 1 1   Write a sentence 1 1 1   Copy design 1 1 0  Copy design-comments 9 animals - -  Total score 26 24 24      ASSESSMENT AND PLAN  79 y.o. year old female  has a past medical history of Anxiety, Arthritis,  Asthma, Chronic bronchitis ([place]), Coronary artery disease, Depression, GERD (gastroesophageal reflux disease), Hearing loss, High cholesterol, Hyperlipemia, Hypertension, Hypothyroidism, IBS (irritable bowel syndrome), Mitral valve prolapse, Seizures ([place]), and Urinary incontinence. here with   Partial epilepsy [facility])  Memory loss  Anxiety and depression  Luzclarita reports that she is doing well from a seizure standpoint.  Dj vu events have become less frequent since last being seen.  We will continue zonisamide 200 mg daily at bedtime and will Cosamin 50 mg twice daily.  I have provided her with patient assistance information for lacosamide.  She will let me know if the cost of this medication becomes too expensive.  We have discussed concerns of memory loss.  She reports having multiple neurocognitive evaluations in the past that were normal.  May consider updating this if memory concerns persist.  I have asked her to monitor any concerns of confusion and document events.  She will call me if this continues.  She was encouraged to continue close follow-up with primary care for anxiety management.  She does plan to see psychology in the upcoming weeks.  Healthy lifestyle habits encouraged.  She will follow-up with [place] in 1 year, sooner if needed.  She verbalizes understanding and agreement with this plan.   No orders of the defined types were placed in this encounter.    I spent 30 minutes of face-to-face and non-face-to-face time with patient.  This included previsit chart review, lab review, study review, order entry, electronic health record documentation, patient education.    Debbora Presto, MSN, FNP-C 10/14/2020, 12:39 PM  [facility] [address], [place], [place] [address] [phone]

## 2020-10-14 NOTE — Patient Instructions (Signed)
Below is our plan:  We will continue zonisamide 200mg  at bedtime and [name] twice daily. Please fill out and return patient assistance packet for [name]. We will complete the provider portion. Let me know if you have any trouble. Continue memory compensation strategies. Consider formal neurocognitive testing. Follow up closely with PCP.    Please make sure you are staying well hydrated. I recommend 50-60 ounces daily. Well balanced diet and regular exercise encouraged.   Please continue follow up with care team as directed.   Follow up in 1 year, sooner if needed.   You may receive a survey regarding today's visit. I encourage you to leave honest feed back as I do use this information to improve patient care. Thank you for seeing me today!       Memory Compensation Strategies  1. Use "WARM" strategy.  W= write it down  A= associate it  R= repeat it  M= make a mental note  2.   You can keep a Social worker.  Use a 3-ring notebook with sections for the following: calendar, important names and phone numbers,  medications, doctors' names/phone numbers, lists/reminders, and a section to journal what you did  each day.   3.    Use a calendar to write appointments down.  4.    Write yourself a schedule for the day.  This can be placed on the calendar or in a separate section of the Memory Notebook.  Keeping a  regular schedule can help memory.  5.    Use medication organizer with sections for each day or morning/evening pills.  You may need help loading it  6.    Keep a basket, or pegboard by the door.  Place items that you need to take out with you in the basket or on the pegboard.  You may also want to  include a message board for reminders.  7.    Use sticky notes.  Place sticky notes with reminders in a place where the task is performed.  For example: " turn off the  stove" placed by the stove, "lock the door" placed on the door at eye level, " take your medications" on  the  bathroom mirror or by the place where you normally take your medications.  8.    Use alarms/timers.  Use while cooking to remind yourself to check on food or as a reminder to take your medicine, or as a  reminder to make a call, or as a reminder to perform another task, etc.    Seizure, Adult A seizure is a sudden burst of abnormal electrical activity in the brain. Seizures usually last from 30 seconds to 2 minutes. They can cause many different symptoms. Usually, seizures are not harmful unless they last a long time. What are the causes? Common causes of this condition include:  Fever or infection.  Conditions that affect the brain, such as: ? A brain abnormality that you were born with. ? A brain or head injury. ? Bleeding in the brain. ? A tumor. ? Stroke. ? Brain disorders such as autism or cerebral palsy.  Low blood sugar.  Conditions that are passed from parent to child (are inherited).  Problems with substances, such as: ? Having a reaction to a drug or a medicine. ? Suddenly stopping the use of a substance (withdrawal). In some cases, the cause may not be known. A person who has repeated seizures over time without a clear cause has a condition called  epilepsy. What increases the risk? You are more likely to get this condition if you have:  A family history of epilepsy.  Had a seizure in the past.  A brain disorder.  A history of head injury, lack of oxygen at birth, or strokes. What are the signs or symptoms? There are many types of seizures. The symptoms vary depending on the type of seizure you have. Examples of symptoms during a seizure include:  Shaking (convulsions).  Stiffness in the body.  Passing out (losing consciousness).  Head nodding.  Staring.  Not responding to sound or touch.  Loss of bladder control and bowel control. Some people have symptoms right before and right after a seizure happens. Symptoms before a seizure may  include:  Fear.  Worry (anxiety).  Feeling like you may vomit (nauseous).  Feeling like the room is spinning (vertigo).  Feeling like you saw or heard something before (dj vu).  Odd tastes or smells.  Changes in how you see. You may see flashing lights or spots. Symptoms after a seizure happens can include:  Confusion.  Sleepiness.  Headache.  Weakness on one side of the body. How is this treated? Most seizures will stop on their own in under 5 minutes. In these cases, no treatment is needed. Seizures that last longer than 5 minutes will usually need treatment. Treatment can include:  Medicines given through an IV tube.  Avoiding things that are known to cause your seizures. These can include medicines that you take for another condition.  Medicines to treat epilepsy.  Surgery to stop the seizures. This may be needed if medicines do not help. Follow these instructions at home: Medicines  Take over-the-counter and prescription medicines only as told by your doctor.  Do not eat or drink anything that may keep your medicine from working, such as alcohol. Activity  Do not do any activities that would be dangerous if you had another seizure, like driving or swimming. Wait until your doctor says it is safe for you to do them.  If you live in the [place] ask your local DMV (department of motor vehicles) when you can drive.  Get plenty of rest. Teaching others Teach friends and family what to do when you have a seizure. They should:  Lay you on the ground.  Protect your head and body.  Loosen any tight clothing around your neck.  Turn you on your side.  Not hold you down.  Not put anything into your mouth.  Know whether or not you need emergency care.  Stay with you until you are better.  General instructions  Contact your doctor each time you have a seizure.  Avoid anything that gives you seizures.  Keep a seizure diary. Write down: ? What you think  caused each seizure. ? What you remember about each seizure.  Keep all follow-up visits as told by your doctor. This is important. Contact a doctor if:  You have another seizure.  You have seizures more often.  There is any change in what happens during your seizures.  You keep having seizures with treatment.  You have symptoms of being sick or having an infection. Get help right away if:  You have a seizure that: ? Lasts longer than 5 minutes. ? Is different than seizures you had before. ? Makes it harder to breathe. ? Happens after you hurt your head.  You have any of these symptoms after a seizure: ? Not being able to speak. ? Not being  able to use a part of your body. ? Confusion. ? A bad headache.  You have two or more seizures in a row.  You do not wake up right after a seizure.  You get hurt during a seizure. These symptoms may be an emergency. Do not wait to see if the symptoms will go away. Get medical help right away. Call your local emergency services (911 in the [place] Do not drive yourself to the hospital. Summary  Seizures usually last from 30 seconds to 2 minutes. Usually, they are not harmful unless they last a long time.  Do not eat or drink anything that may keep your medicine from working, such as alcohol.  Teach friends and family what to do when you have a seizure.  Contact your doctor each time you have a seizure. This information is not intended to replace advice given to you by your health care provider. Make sure you discuss any questions you have with your health care provider. Document Revised: 12/23/2018 Document Reviewed: 12/23/2018 Elsevier Patient Education  [place].   Zonisamide capsules What is this medicine? ZONISAMIDE (zoe NIS a mide) is used to control partial seizures in adults with epilepsy. This medicine may be used for other purposes; ask your health care provider or pharmacist if you have questions. COMMON BRAND  NAME(S): Zonegran What should I tell my health care provider before I take this medicine? They need to know if you have any of these conditions:  kidney disease  liver disease  low level of bicarbonate in the blood  lung or breathing disease, like asthma  suicidal thoughts, plans, or attempt; a previous suicide attempt by you or a family member  an unusual or allergic reaction to zonisamide, sulfa drugs, other medicines, foods, dyes, or preservatives  pregnant or trying to get pregnant  breast-feeding How should I use this medicine? Take this medicine by mouth with a glass of water. Follow the directions on the prescription label. Do not cut, crush or chew this medicine. Swallow the capsules whole. You can take this medicine with or without food. If it upsets your stomach, take it with food. Take your medicine at regular intervals. Do not take it more often than directed. Do not stop taking except on your doctor's advice. A special MedGuide will be given to you by the pharmacist with each prescription and refill. Be sure to read this information carefully each time. Talk to your pediatrician regarding the use of this medicine in children. While this drug may be prescribed for children as young as 16 years for selected conditions, precautions do apply. Overdosage: If you think you have taken too much of this medicine contact a poison control center or emergency room at once. NOTE: This medicine is only for you. Do not share this medicine with others. What if I miss a dose? If you miss a dose, take it as soon as you can. If it is almost time for your next dose, take only that dose. Do not take double or extra doses. What may interact with this medicine? This medicine may interact with the following medications:  acetazolamide  antihistamines for allergy, cough, and cold  atropine  certain medicines for anxiety or sleep  certain medicines for bladder problems like oxybutynin,  tolterodine  certain medicines for depression like amitriptyline, fluoxetine, sertraline  certain medicines for seizures like carbamazepine, phenobarbital, phenytoin, primidone, topiramate, valproic acid  certain medicines for stomach problems like dicyclomine, hyoscyamine  certain medicines for travel  sickness like scopolamine  certain medicines for Parkinson's disease like benztropine, trihexyphenidyl  dichlorphenamide  general anesthetics like halothane, isoflurane, methoxyflurane, propofol  ipratropium  medicines that relax muscles for surgery  narcotic medicines for pain  phenothiazines like chlorpromazine, mesoridazine, prochlorperazine, thioridazine  rifampin This list may not describe all possible interactions. Give your health care provider a list of all the medicines, herbs, non-prescription drugs, or dietary supplements you use. Also tell them if you smoke, drink alcohol, or use illegal drugs. Some items may interact with your medicine. What should I watch for while using this medicine? Visit your health care professional for regular checks on your progress. Tell your health care professional if your symptoms do not start to get better or if they get worse. Wear a medical ID bracelet or chain. Carry a card that describes your disease and details of your medicine and dosage times. Do not stop taking except on your health care professional's advice. You may develop a severe reaction. Your health care professional will tell you how much medicine to take. You may get drowsy or dizzy. Do not drive, use machinery, or do anything that needs mental alertness until you know how this medicine affects you. Do not stand up or sit up quickly, especially if you are an older patient. This reduces the risk of dizzy or fainting spells. Alcohol may interfere with the effect of this medicine. Avoid alcoholic drinks. Tell your health care professional right away if you have any change in your  eyesight. This medicine can reduce the response of your body to heat or cold. Dress warm in cold weather and stay hydrated in hot weather. If possible, avoid extreme temperatures like saunas, hot tubs, very hot or cold showers, or activities that can cause dehydration such as vigorous exercise. This medicine may cause serious skin reactions. They can happen weeks to months after starting the medicine. Contact your health care provider right away if you notice fevers or flu-like symptoms with a rash. The rash may be red or purple and then turn into blisters or peeling of the skin. Or, you might notice a red rash with swelling of the face, lips or lymph nodes in your neck or under your arms. If you or your family notice any changes in your behavior, such as new or worsening depression, thoughts of harming yourself, anxiety, other unusual or disturbing thoughts, or memory loss, call your health care professional right away. What side effects may I notice from receiving this medicine? Side effects that you should report to your doctor or health care professional as soon as possible:  allergic reactions like skin rash, itching or hives, swelling of the face, lips, or tongue  blood in the urine  changes in vision  confusion  fever  hallucinations  loss of appetite  loss of memory  pain in the lower back or side  pain when urinating  rash, fever, and swollen lymph nodes  redness, blistering, peeling or loosening of the skin, including inside the mouth  signs and symptoms of increased acid in the body like breathing fast; fast heartbeat; headache; confusion; unusually weak or tired; nausea, vomiting  signs and symptoms of low red blood cells or anemia such as unusually weak or tired; feeling faint or lightheaded; falls; breathing problems  suicidal thoughts, mood changes  unusual sweating Side effects that usually do not require medical attention (report to your doctor or health care  professional if they continue or are bothersome):  dizziness  headache  irritable  nausea  tiredness  trouble sleeping This list may not describe all possible side effects. Call your doctor for medical advice about side effects. You may report side effects to FDA at 1-800-FDA-1088. Where should I keep my medicine? Keep out of reach of children. Store at room temperature between 15 and 30 degrees C (59 and 86 degrees F). Keep in a dry place protected from light. Throw away any unused medicine after the expiration date. NOTE: This sheet is a summary. It may not cover all possible information. If you have questions about this medicine, talk to your doctor, pharmacist, or health care provider.  2020 Elsevier/Gold Standard (2019-02-09 15:13:05)

## 2020-10-16 ENCOUNTER — Other Ambulatory Visit: Payer: Self-pay

## 2020-10-16 DIAGNOSIS — G40109 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, not intractable, without status epilepticus: Secondary | ICD-10-CM

## 2020-10-16 MED ORDER — LACOSAMIDE 50 MG PO TABS: ORAL_TABLET | ORAL | 4 refills | Status: DC

## 2020-10-31 ENCOUNTER — Other Ambulatory Visit: Payer: Self-pay

## 2020-10-31 ENCOUNTER — Other Ambulatory Visit: Payer: Self-pay | Admitting: *Deleted

## 2020-10-31 NOTE — Patient Outreach (Signed)
[place] [facility]) Care Management  10/31/2020  BIRDELLA SIPPEL 1941-08-28 [id number]   Telephone outreach for follow up CAD, CHF  Mrs. Melick is doing fairly well. She has not had any health set backs in the last month. She denies any CP, SOB, or edema. She is not weighing and she says she did not remember she was to do that. She does not have a cardiology eval schedule until later this year.  She reports having gone to Dr. Delene Ruffini office and met with their Pharm D. Her Effexor was discontinued and replaced by Pristiq 50 mg daily.   Goals Addressed            This Visit's Progress   . Patient Stated   On track    Will call MD, PA or NP providers to report any new health issues early to avoid complications and hospitalizations. 09/30/20 Ms. Ripley is always very good to reach out and discuss medical and or emotional issues for resolution. 10/31/20 Pt is stable. She has seen her primary care provider and pharmacist during the last 30 days. One med change effexor to pristiq as the first was not having an adequate effect.    . Patient Stated   On track    Will call from a list of providers and make an appt with a mental health professional over the next 30 days. 10/31/20 Pt did accomplish this with assistance of NP finding a provider closest to her home (in [place]). Unfortunately, before the appt, the provider had to cancel and then before the rescheduled appt, she found that the provider was not on [facility] provider list. Lorella Nimrod is assisting her now with finding a provider. Mrs. 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 She will follow up with the pharm D sometime this month.      We will talk again in one month per agreement.  [name]. Myrtie Neither, MSN, [facility] Gerontological Nurse Practitioner [facility] Care Management [phone]

## 2020-11-05 DIAGNOSIS — E782 Mixed hyperlipidemia: Secondary | ICD-10-CM | POA: Diagnosis not present

## 2020-11-05 DIAGNOSIS — I10 Essential (primary) hypertension: Secondary | ICD-10-CM | POA: Diagnosis not present

## 2020-11-05 DIAGNOSIS — I2510 Atherosclerotic heart disease of native coronary artery without angina pectoris: Secondary | ICD-10-CM | POA: Diagnosis not present

## 2020-11-05 DIAGNOSIS — E039 Hypothyroidism, unspecified: Secondary | ICD-10-CM | POA: Diagnosis not present

## 2020-11-09 ENCOUNTER — Other Ambulatory Visit: Payer: Self-pay | Admitting: Diagnostic Neuroimaging

## 2020-11-11 ENCOUNTER — Telehealth: Payer: Self-pay | Admitting: Family Medicine

## 2020-11-11 NOTE — Telephone Encounter (Signed)
Pt called wanting to know if she can be referred to a counselor in the [place] or [place] area. Please advise.

## 2020-11-11 NOTE — Telephone Encounter (Signed)
I called patient.  I recommended that she speak to her PCP regarding a referral to counseling.  Patient reports that she needs counseling for depression and anxiety.  Patient verbalized understanding of my recommendations and had no further questions.

## 2020-11-12 DIAGNOSIS — K449 Diaphragmatic hernia without obstruction or gangrene: Secondary | ICD-10-CM | POA: Diagnosis not present

## 2020-11-12 DIAGNOSIS — D3001 Benign neoplasm of right kidney: Secondary | ICD-10-CM | POA: Diagnosis not present

## 2020-11-12 DIAGNOSIS — D3002 Benign neoplasm of left kidney: Secondary | ICD-10-CM | POA: Diagnosis not present

## 2020-11-12 DIAGNOSIS — D1771 Benign lipomatous neoplasm of kidney: Secondary | ICD-10-CM | POA: Diagnosis not present

## 2020-11-12 DIAGNOSIS — C642 Malignant neoplasm of left kidney, except renal pelvis: Secondary | ICD-10-CM | POA: Diagnosis not present

## 2020-11-12 DIAGNOSIS — D3000 Benign neoplasm of unspecified kidney: Secondary | ICD-10-CM | POA: Diagnosis not present

## 2020-11-19 DIAGNOSIS — D3001 Benign neoplasm of right kidney: Secondary | ICD-10-CM | POA: Diagnosis not present

## 2020-12-03 ENCOUNTER — Other Ambulatory Visit: Payer: Self-pay | Admitting: *Deleted

## 2020-12-03 NOTE — Patient Outreach (Signed)
[place] [facility]) Care Management  12/03/2020  AJNA MOORS 19-Jun-1941 [id number]   Telephone outreach, Unsuccessful, left message and requested a return call.  Ms. Halberstam returned my call. She reports she has started her [employer]neakers classes and is feeling many benefits. She denies any cardiac sxs. She is weighing daily.  Goals Addressed            This Visit's Progress   . Patient Stated   On track    Long term goal High priority Started on 06/21/20, renewed 12/03/20 Projected end date: 03/18/21  Will call MD, PA or NP providers to report any new health issues early to avoid complications and hospitalizations.   09/30/20 Ms. [place] is always very good to reach out and discuss medical and or emotional issues for resolution. 10/31/20 Pt is stable. She has seen her primary care provider and pharmacist during the last 30 days. One med change effexor to pristiq as the first was not having an adequate effect. 12/03/20 Renewed this goal today as continued vigilance and early outreach to her medical provider with complications is paramount to avoid complications and reinforced to pt who agrees and promises she will do so. No hospitalization since last September.    . Patient Stated   On track    Short term goal Started on 09/30/20, Renewed 12/03/20 Medium priority Estimated completion date: 12/31/20    Will call from a list of providers and make an appt with a mental health professional over the next 30 days. 10/31/20 Pt did accomplish this with assistance of NP finding a provider closest to her home (in [place]). Unfortunately, before the appt, the provider had to cancel and then before the rescheduled appt, she found that the provider was not on [facility] provider list. Lorella Nimrod is assisting her now with finding a provider. Mrs. 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 She will follow up with the pharm  D sometime this month. 12/03/20 She has found a mental health professional that takes her [place], the authorizatioin process is progress at this time. Encouraged pt to make and keep her appt before we talk again in one month. She assures me she will follow through.      We agreed to talk in one month.  [name]. Myrtie Neither, MSN, [facility] Gerontological Nurse Practitioner [facility] Care Management [phone]   [name]. Myrtie Neither, MSN, [facility] Gerontological Nurse Practitioner [facility] Care Management [phone]

## 2020-12-26 DIAGNOSIS — K580 Irritable bowel syndrome with diarrhea: Secondary | ICD-10-CM | POA: Diagnosis not present

## 2020-12-31 ENCOUNTER — Other Ambulatory Visit: Payer: Self-pay | Admitting: *Deleted

## 2020-12-31 ENCOUNTER — Other Ambulatory Visit: Payer: Self-pay

## 2020-12-31 NOTE — Patient Outreach (Signed)
[facility]) Care Management   [facility] Care Manager  12/31/2020   Suzanne Galloway 12-Jun-1941 [id number]  Subjective: Telephone outreach to follow up on CAD, HF, IBS, Depression Has seen Dr. Watt Galloway this month. Continues to feel somewhat depressed. She is on a waiting list to see a mental health professional. She continues to take her antidepressants. Has been taking alprazolam bid which has helped her a lot. She is back in [employer] 3 times a week. She does identify that her daughter and husband are also struggling with depression due to the lack of job security.   Objective:  Pt reports wt range: 128-133, Pulse Oxygen 98, Pulse 74  Encounter Medications:  Outpatient Encounter Medications as of 12/31/2020  Medication Sig Note  . ALPRAZolam (XANAX) 0.25 MG tablet 1 tablet   . Ascorbic Acid (VITAMIN C PO) Take 1 tablet by mouth daily.   Marland Kitchen aspirin 81 MG chewable tablet Chew 81 mg by mouth in the morning and at bedtime.    Marland Kitchen atorvastatin (LIPITOR) 80 MG tablet Take 80 mg by mouth daily.   . Calcium Carbonate-Vitamin D 600-400 MG-UNIT tablet Take 1 tablet by mouth daily.   Marland Kitchen desvenlafaxine (PRISTIQ) 50 MG 24 hr tablet Take 1 tablet by mouth daily.   . diphenoxylate-atropine (LOMOTIL) 2.5-0.025 MG tablet 1 tablet as needed   . escitalopram (LEXAPRO) 20 MG tablet Take 20 mg by mouth daily.   . famotidine (PEPCID) 40 MG tablet Take 40 mg by mouth 2 (two) times daily.    . isosorbide mononitrate (IMDUR) 30 MG 24 hr tablet Take 30 mg by mouth daily.   Marland Kitchen lacosamide ([name]) 50 MG TABS tablet Take 1 tablet (50 mg total) by mouth 2 (two) times daily.   Marland Kitchen levothyroxine (SYNTHROID) 75 MCG tablet Take 75 mcg by mouth every morning.    . loperamide (IMODIUM A-D) 2 MG tablet Take 2 mg by mouth 4 (four) times daily as needed for diarrhea or loose stools.   . metoprolol succinate (TOPROL-XL) 25 MG 24 hr tablet Take 25 mg by mouth daily.   . Multiple Vitamins-Minerals (PRESERVISION AREDS PO)  Take 1 tablet by mouth daily.    . nitroGLYCERIN (NITROSTAT) 0.4 MG SL tablet Place 0.4 mg under the tongue every 5 (five) minutes as needed for chest pain. 05/16/2020: Has not needed to take  . Polyethyl Glycol-Propyl Glycol (SYSTANE OP) Apply 1 drop to eye daily as needed.   . sacubitril-valsartan (ENTRESTO) 24-26 MG Take 1 tablet by mouth 2 (two) times daily.    Marland Kitchen spironolactone (ALDACTONE) 25 MG tablet Take 25 mg by mouth daily.   . sucralfate (CARAFATE) 1 g tablet Take 1 g by mouth 4 (four) times daily -  with meals and at bedtime.   . ticagrelor (BRILINTA) 90 MG TABS tablet Take 90 mg by mouth 2 (two) times daily.    Marland Kitchen zonisamide (ZONEGRAN) 100 MG capsule Take 2 capsules (200 mg total) by mouth at bedtime.    No facility-administered encounter medications on file as of 12/31/2020.   Assessment: CAD   HF   GERD   Depression  Goals Addressed              This Visit's Progress     Patient Stated   .  Patient Stated (pt-stated)        Start date 12/31/20 Long range goal Medium priority Estimated end date 04/03/21  Follow up date 04/03/21  Patient will report improved coping mechanisms with counseling  and improved general well-being in 3 months.       Other   .  Patient Stated   On track     Long term goal High priority Started on 06/21/20, renewed 12/03/20 Projected end date: 04/03/21  Will call MD, PA or NP providers to report any new health issues early to avoid complications and hospitalizations.   09/30/20 Suzanne Galloway is always very good to reach out and discuss medical and or emotional issues for resolution. 10/31/20 Pt is stable. She has seen her primary care provider and pharmacist during the last 30 days. One med change effexor to pristiq as the first was not having an adequate effect. 12/03/20 Renewed this goal today as continued vigilance and early outreach to her medical provider with complications is paramount to avoid complications and reinforced to pt who agrees and  promises she will do so. No hospitalization since last September. 12/31/20. Pt calls NP with questions or concerns and discussed issues freely and does follow through with recommendations.    .  COMPLETED: Patient Stated        Short term goal Started on 09/30/20, Renewed 12/03/20 Medium priority Estimated completion date: 12/31/20    Will call from a list of providers and make an appt with a mental health professional over the next 30 days. 10/31/20 Pt did accomplish this with assistance of NP finding a provider closest to her home (in [place]). Unfortunately, before the appt, the provider had to cancel and then before the rescheduled appt, she found that the provider was not on [facility] provider list. Suzanne Galloway is assisting her now with finding a provider. Suzanne Galloway has been chronically depressed since her husband died many years ago and she has had several health set backs including 2 MIs. She will follow up with the pharm D sometime this month. 12/03/20 She has found a mental health professional that takes her [place], the authorizatioin process is progress at this time. Encouraged pt to make and keep her appt before we talk again in one month. She assures me she will follow through. 12/31/20 Pt is on a waiting list to see a provider.       Plan: We agreed to talk again in 3 months, June 16th.  Follow-up:  Patient agrees to Care Plan and Follow-up.   Suzanne Galloway. Myrtie Neither, MSN, [facility] Gerontological Nurse Practitioner [facility] Care Management [phone]

## 2021-01-01 ENCOUNTER — Other Ambulatory Visit: Payer: Self-pay | Admitting: Diagnostic Neuroimaging

## 2021-01-01 DIAGNOSIS — G40109 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, not intractable, without status epilepticus: Secondary | ICD-10-CM

## 2021-01-06 ENCOUNTER — Telehealth: Payer: Self-pay | Admitting: Internal Medicine

## 2021-01-06 DIAGNOSIS — E039 Hypothyroidism, unspecified: Secondary | ICD-10-CM

## 2021-01-06 DIAGNOSIS — E7800 Pure hypercholesterolemia, unspecified: Secondary | ICD-10-CM

## 2021-01-06 NOTE — Telephone Encounter (Signed)
Pre-physical Lab Request (Adults 18-64):    Requesting screening lab work to be ordered prior to next physical exam scheduled on 04/11.           Kerry Kass   MOSC II   [facility]

## 2021-01-07 NOTE — Telephone Encounter (Signed)
Patient scheduled.

## 2021-01-07 NOTE — Telephone Encounter (Signed)
Labs ordered.

## 2021-01-08 ENCOUNTER — Ambulatory Visit: Payer: Medicare Other | Attending: Internal Medicine

## 2021-01-08 DIAGNOSIS — E7800 Pure hypercholesterolemia, unspecified: Secondary | ICD-10-CM

## 2021-01-08 DIAGNOSIS — E039 Hypothyroidism, unspecified: Secondary | ICD-10-CM | POA: Insufficient documentation

## 2021-01-08 LAB — COMPREHENSIVE METABOLIC PANEL
Alanine Transferase (ALT): 14 U/L (ref <=33)
Albumin: 4 g/dL (ref 4.0–4.9)
Alkaline Phosphatase (ALP): 75 U/L (ref 35–129)
Aspartate Transaminase (AST): 21 U/L (ref <=41)
Bilirubin Total: 0.6 mg/dL (ref <=1.2)
Calcium: 9 mg/dL (ref 8.6–10.0)
Carbon Dioxide Total: 27 mmol/L (ref 22–29)
Chloride: 104 mmol/L (ref 98–107)
Creatinine Serum: 0.89 mg/dL (ref 0.51–1.17)
E-GFR Creatinine (Female): 62 mL/min/{1.73_m2}
Glucose: 83 mg/dL (ref 74–109)
Potassium: 4.6 mmol/L (ref 3.4–5.1)
Protein: 5.9 g/dL — ABNORMAL LOW (ref 6.6–8.7)
Sodium: 141 mmol/L (ref 136–145)
Urea Nitrogen, Blood (BUN): 15 mg/dL (ref 6–20)

## 2021-01-08 LAB — THYROID STIMULATING HORMONE: Thyroid Stimulating Hormone: 1.96 u[IU]/mL (ref 0.27–4.20)

## 2021-01-08 LAB — CBC WITH DIFFERENTIAL
Basophils % Auto: 1 %
Basophils Abs Auto: 0 10*3/uL (ref 0.0–0.2)
Hemoglobin: 13.9 g/dL (ref 12.0–16.0)
Lymphocytes % Auto: 33 %
Lymphocytes Abs Auto: 1.6 10*3/uL (ref 1.0–4.8)
MCH: 31 pg (ref 27.0–33.0)
MPV: 8.5 fL (ref 6.8–10.0)
Monocytes % Auto: 8.6 %
Platelet Count: 255 10*3/uL (ref 130–400)
Red Blood Cell Count: 4.5 10*6/uL (ref 4.00–5.20)
White Blood Cell Count: 4.9 10*3/uL (ref 4.5–11.0)

## 2021-01-08 LAB — LIPID PANEL
Cholesterol: 202 mg/dL — ABNORMAL HIGH (ref <200)
HDL Cholesterol: 70 mg/dL (ref >40)
LDL Cholesterol Calculation: 115 mg/dL — ABNORMAL HIGH (ref <100)
Non-HDL Cholesterol: 132 mg/dL (ref <=150)
Total Cholesterol: HDL Ratio: 2.9 (ref <4.0)
Triglyceride: 85 mg/dL (ref <150)

## 2021-01-09 LAB — CBC WITH DIFFERENTIAL
Eosinophils % Auto: 3.1 %
Eosinophils Abs Auto: 0.2 10*3/uL (ref 0.0–0.5)
Hematocrit: 41.1 % (ref 36.0–46.0)
MCHC: 33.9 % (ref 32.0–36.0)
MCV: 91.4 fL (ref 80.0–100.0)
Monocytes Abs Auto: 0.4 10*3/uL (ref 0.1–0.8)
Neutrophils % Auto: 54.3 %
Neutrophils Abs Auto: 2.6 10*3/uL (ref 1.8–7.7)
RDW: 14.2 % (ref 0.0–14.7)

## 2021-01-22 DIAGNOSIS — H04123 Dry eye syndrome of bilateral lacrimal glands: Secondary | ICD-10-CM | POA: Diagnosis not present

## 2021-01-22 DIAGNOSIS — H43813 Vitreous degeneration, bilateral: Secondary | ICD-10-CM | POA: Diagnosis not present

## 2021-01-22 DIAGNOSIS — Z961 Presence of intraocular lens: Secondary | ICD-10-CM | POA: Diagnosis not present

## 2021-01-27 ENCOUNTER — Encounter: Payer: Self-pay | Admitting: Internal Medicine

## 2021-01-27 ENCOUNTER — Ambulatory Visit: Payer: Medicare Other | Admitting: Internal Medicine

## 2021-01-27 VITALS — BP 120/70 | HR 69 | Temp 97.5°F | Ht 65.0 in | Wt 162.7 lb

## 2021-01-27 DIAGNOSIS — E039 Hypothyroidism, unspecified: Secondary | ICD-10-CM

## 2021-01-27 DIAGNOSIS — R0602 Shortness of breath: Secondary | ICD-10-CM

## 2021-01-27 DIAGNOSIS — E7800 Pure hypercholesterolemia, unspecified: Secondary | ICD-10-CM

## 2021-01-27 NOTE — Progress Notes (Signed)
Chief Complaint: Patient presents today with   Follow-up on the blood  cholesterol    Patient WAS wearing a surgical mask   Contact and Droplet precautions were followed when caring for the patient.   PPE used by provider during encounter: Surgical mask,face shield and gloves used     [name] If you are reviewing this progress note and have questions about the meaning or medical terms being used, please schedule an appointment or bring it up at your next follow-up appointment. Medical notes are meant to be a communication tool between medical professionals and require medical terms to be used for efficiency.      - Voice recognition software may have been used in portions of this report.   Inadvertent computerized transcription errors may be present despite efforts.    Portions of this note were dictated using [employer]. Omissions, "sound-alikes" and sentence fragmentation are unintentional.        Her shortness of breath has improved she was referred to Dr. Doreatha Martin but she did not go as unable to drive to [place].  She says shortness of breath improved as he was doing too much of yard work she has cut back      Hyperlipidemia,    management discussed with patient, shehas  been working on  diet, she has  been doing regular exercise. Patient verbalised understanding about the significance for regular exercise and low fat diet in management of this problem    Lab Results   Lab Name Value Date/Time    CHOL 202 (H) 01/08/2021 07:58 AM    CHOL 188 05/09/2014 09:44 AM    LDLC 115 (H) 01/08/2021 07:58 AM    LDLC 107 05/09/2014 09:44 AM    HDL 70 01/08/2021 07:58 AM    HDL 59 05/09/2014 09:44 AM    TRIG 85 01/08/2021 07:58 AM    TRIG 108 05/09/2014 09:44 AM     No symptoms of hypo or hyperthyroidism: no decreased or increased weight, no feeling cold/chilly or excessively warm, no diarrhea or constipation, no undue sweatiness, anxiety or palpitations.       Fall risks Assessment:   No falls in past year. No throw  rugs, poorly lighted areas, missing rails/grab bars in the home. No medications or conditions that affect stability or mobility.      Ros  complete review of symptons was done all other systems are negative except as otherwise stated in HPI      Vs  BP 120/70 (SITE: right arm, Orthostatic Position: sitting, Cuff Size: regular)   Pulse 69   Temp 36.4 C (97.5 F) (Temporal)   Ht 1.651 m (5\' 5" )   Wt 73.8 kg (162 lb 11.2 oz)   SpO2 97%   BMI 27.07 kg/m     Physical exam -  General Appearance: healthy, alert, no distress, pleasant affect, cooperative.  Eyes:  conjunctivae and corneas clear. PERRL, EOM's intact. sclerae normal.  Ears:  external inspection of ears show no abnormality.  Lungs: no chest deformities noted .lungs clear to auscultation.  Heart:  normal rate and regular rhythm, no murmurs,  Extremities:  no cyanosis, clubbing, or edema.    ASSESMENT AND PLAN     (E78.00) Pure hypercholesterolemia  (primary encounter diagnosis)  Comment: Mildly elevated cholesterol  Plan: CBC with Differential, Comprehensive Metabolic         Panel, Lipid Panel, Thyroid Stimulating Hormone      (E03.9) Acquired hypothyroidism  Comment:   Plan:  Thyroid Stimulating Hormone            (R06.02) SOB (shortness of breath)  Comment: Improved  Plan:      I did review patient's past medical and family/social history, no changes noted.  Barriers to Learning assessed: none. Patient verbalizes understanding of teaching and instructions.  Zettie Cooley MD      Electronically Signed By:  Zettie Cooley, MD  Physician associate   [facility] [place]  [phone]

## 2021-01-27 NOTE — Nursing Note (Signed)
Patient identification verified by last name and date of birth.  Since you last saw a [place] provider, have you seen by any other physicians or surgeons outside of [place], been to emergency room/urgent care or been hospitalized? No   Have you started any over the counter medications, herbal supplements, vitamins or home remedies? No  Patient here today accompanied by herself to review lab results.    Patient WAS wearing a surgical mask  Droplet precautions were followed when caring for the patient.   PPE used by provider during encounter: Surgical mask.    Tank Difiore MA

## 2021-02-03 ENCOUNTER — Other Ambulatory Visit: Payer: Self-pay | Admitting: *Deleted

## 2021-02-03 NOTE — Patient Outreach (Signed)
[facility]) Care Management  [place]  02/03/2021   Suzanne Galloway 24-May-1941 [id number]  Subjective: Incoming call from pt reporting GI problems over the weekend.  Encounter Medications:  Outpatient Encounter Medications as of 02/03/2021  Medication Sig Note  . ALPRAZolam (XANAX) 0.25 MG tablet 1 tablet   . Ascorbic Acid (VITAMIN C PO) Take 1 tablet by mouth daily.   Marland Kitchen aspirin 81 MG chewable tablet Chew 81 mg by mouth in the morning and at bedtime.    Marland Kitchen atorvastatin (LIPITOR) 80 MG tablet Take 80 mg by mouth daily.   . Calcium Carbonate-Vitamin D 600-400 MG-UNIT tablet Take 1 tablet by mouth daily.   Marland Kitchen desvenlafaxine (PRISTIQ) 50 MG 24 hr tablet Take 1 tablet by mouth daily.   . diphenoxylate-atropine (LOMOTIL) 2.5-0.025 MG tablet 1 tablet as needed   . escitalopram (LEXAPRO) 20 MG tablet Take 20 mg by mouth daily.   . famotidine (PEPCID) 40 MG tablet Take 40 mg by mouth 2 (two) times daily.    . isosorbide mononitrate (IMDUR) 30 MG 24 hr tablet Take 30 mg by mouth daily.   Marland Kitchen lacosamide ([name]) 50 MG TABS tablet TAKE ONE TABLET BY MOUTH TWICE DAILY   . levothyroxine (SYNTHROID) 75 MCG tablet Take 75 mcg by mouth every morning.    . loperamide (IMODIUM A-D) 2 MG tablet Take 2 mg by mouth 4 (four) times daily as needed for diarrhea or loose stools.   . metoprolol succinate (TOPROL-XL) 25 MG 24 hr tablet Take 25 mg by mouth daily.   . Multiple Vitamins-Minerals (PRESERVISION AREDS PO) Take 1 tablet by mouth daily.    . nitroGLYCERIN (NITROSTAT) 0.4 MG SL tablet Place 0.4 mg under the tongue every 5 (five) minutes as needed for chest pain. 05/16/2020: Has not needed to take  . Polyethyl Glycol-Propyl Glycol (SYSTANE OP) Apply 1 drop to eye daily as needed.   . sacubitril-valsartan (ENTRESTO) 24-26 MG Take 1 tablet by mouth 2 (two) times daily.    Marland Kitchen spironolactone (ALDACTONE) 25 MG tablet Take 25 mg by mouth daily.   . sucralfate (CARAFATE) 1 g tablet Take 1 g by  mouth 4 (four) times daily -  with meals and at bedtime.   . ticagrelor (BRILINTA) 90 MG TABS tablet Take 90 mg by mouth 2 (two) times daily.    Marland Kitchen zonisamide (ZONEGRAN) 100 MG capsule Take 2 capsules (200 mg total) by mouth at bedtime.    No facility-administered encounter medications on file as of 02/03/2021.   Fall/Depression Screening: Fall Risk  04/06/2020 08/10/2018 04/13/2018  Falls in the past year? 0 No No  Number falls in past yr: 0 - -  Injury with Fall? 0 - -   PHQ 2/9 Scores 09/11/2020 05/27/2020 05/23/2020 04/06/2020 06/11/2017  PHQ - 2 Score 0 1 1 2 2   PHQ- 9 Score - - - 4 5   Assessment: IBS with diarrhea                        Probable viral GI illness superimprosed.                        Depression/Anxiety  Goals Addressed              This Visit's Progress     Patient Stated   .  Patient Stated (pt-stated)        Start date 12/31/20 Long range goal  Medium priority Estimated end date 04/03/21  Follow up date 04/03/21  Patient will report improved coping mechanisms with counseling and improved general well-being in 3 months. 02/03/21 Still has not been able to see a professional. Encouraged pt to talk with me when she is having difficulty for reassurance and suggestions. No SI.      Other   .  Patient Stated   On track     Long term goal High priority Started on 06/21/20, renewed 12/03/20 Projected end date: 04/03/21  Will call MD, PA or NP providers to report any new health issues early to avoid complications and hospitalizations.   09/30/20 Suzanne Galloway is always very good to reach out and discuss medical and or emotional issues for resolution. 10/31/20 Pt is stable. She has seen her primary care provider and pharmacist during the last 30 days. One med change effexor to pristiq as the first was not having an adequate effect. 12/03/20 Renewed this goal today as continued vigilance and early outreach to her medical provider with complications is paramount to avoid  complications and reinforced to pt who agrees and promises she will do so. No hospitalization since last September. 12/31/20. Pt calls NP with questions or concerns and discussed issues freely and does follow through with recommendations. 02/03/21 Pt called to report being sick since Friday, nausea, vomiting, diarrhea, no fever, but starting to feel better today. She reports eating at church pot luck on Friday and she has a hx of IBS and a hiatal hernia. We reviewed her meds for her GI issues, diet. She uses lactose free dairy products and does eat fruits and vegetables. She does eat more gas producing food than she should. Agrees to reduce these. Providing pt education on IBS and Hiatal Hernia.      Plan: We will talk again on Wednesday.  Follow-up:  Patient agrees to Care Plan and Follow-up.   [name]. Myrtie Neither, MSN, [facility] Gerontological Nurse Practitioner [facility] Care Management [phone]

## 2021-02-05 ENCOUNTER — Other Ambulatory Visit: Payer: Self-pay

## 2021-02-05 ENCOUNTER — Other Ambulatory Visit: Payer: Self-pay | Admitting: *Deleted

## 2021-02-05 NOTE — Patient Outreach (Signed)
[facility]) Care Management  02/05/2021  Suzanne Galloway Jun 07, 1941 [id number]   Incoming call from Suzanne Galloway. She reports continued GI sxs over the last 2 days: chest pressure, vomiting (emesis was liquid, pea green in color, no coffee ground substance), limits her ability to take a deep breath. No fever, no sharp pains, no diaphoresis. Hx cholecystectomy. This episode started last Friday 01/31/21. Awakened with upper abdominal pressure between sternum and naval. Very hard to describe, it drains all of her energy. It puts her in bed for the duration of the episode which can be days. Usual to have vomiting. Episodes are more frequent now, before were only once a month. Only able to take in liquids and jello. She is belching a lot. She is afraid, especially after having 2 MIs that something is very wrong.  This evening she reported having 4 formed stools today.   She will see her new primary care provider on Monday, 02/10/21  She has had a workup, in 2021 and a CT in Jan 2022 (see Dr. Perley Jain report)  Medical hx includes: Hiatal Hermia, GERD CAD, MI X 2 in 2021, Mitral Valve Prolapse,  HTN,  Partial Epilepsy, Transient global amnesia , Hypothyroidism, Anxiety, Depression  CT impression was: 1) 84mm lesion R kidney, no change from previous, could be renal cell carcinoma/papillary type. 2)12 mm L kidney angiomyolipoma; 3)Tiny hiatal hernia; 4) aorticathrlerosclerosis  [facility], Theodoro Kalata, MD, Cardiology  Dr. Lavone Orn has been her primary for many years Dr. Watt Climes, GI Dr. Louis Meckel - [facility] was f/u for lesions on kidney  Outpatient Encounter Medications as of 02/05/2021  Medication Sig Note  . ALPRAZolam (XANAX) 0.25 MG tablet 1 tablet   . Ascorbic Acid (VITAMIN C PO) Take 1 tablet by mouth daily.   Marland Kitchen aspirin 81 MG chewable tablet Chew 81 mg by mouth in the morning and at bedtime.    Marland Kitchen atorvastatin (LIPITOR) 80 MG tablet Take 80 mg by mouth daily.   . Calcium  Carbonate-Vitamin D 600-400 MG-UNIT tablet Take 1 tablet by mouth daily.   Marland Kitchen desvenlafaxine (PRISTIQ) 50 MG 24 hr tablet Take 1 tablet by mouth daily.   . diphenoxylate-atropine (LOMOTIL) 2.5-0.025 MG tablet 1 tablet as needed   . escitalopram (LEXAPRO) 20 MG tablet Take 20 mg by mouth daily.   . famotidine (PEPCID) 40 MG tablet Take 40 mg by mouth 2 (two) times daily.    . isosorbide mononitrate (IMDUR) 30 MG 24 hr tablet Take 30 mg by mouth daily.   Marland Kitchen lacosamide ([name]) 50 MG TABS tablet TAKE ONE TABLET BY MOUTH TWICE DAILY   . levothyroxine (SYNTHROID) 75 MCG tablet Take 75 mcg by mouth every morning.    . loperamide (IMODIUM A-D) 2 MG tablet Take 2 mg by mouth 4 (four) times daily as needed for diarrhea or loose stools.   . metoprolol succinate (TOPROL-XL) 25 MG 24 hr tablet Take 25 mg by mouth daily.   . Multiple Vitamins-Minerals (PRESERVISION AREDS PO) Take 1 tablet by mouth daily.    . nitroGLYCERIN (NITROSTAT) 0.4 MG SL tablet Place 0.4 mg under the tongue every 5 (five) minutes as needed for chest pain. 05/16/2020: Has not needed to take  . Polyethyl Glycol-Propyl Glycol (SYSTANE OP) Apply 1 drop to eye daily as needed.   . sacubitril-valsartan (ENTRESTO) 24-26 MG Take 1 tablet by mouth 2 (two) times daily.    Marland Kitchen spironolactone (ALDACTONE) 25 MG tablet Take 25 mg by mouth daily.   Marland Kitchen  sucralfate (CARAFATE) 1 g tablet Take 1 g by mouth 4 (four) times daily -  with meals and at bedtime.   . ticagrelor (BRILINTA) 90 MG TABS tablet Take 90 mg by mouth 2 (two) times daily.    Marland Kitchen zonisamide (ZONEGRAN) 100 MG capsule Take 2 capsules (200 mg total) by mouth at bedtime.    No facility-administered encounter medications on file as of 02/05/2021.   Goals Addressed              This Visit's Progress     Patient Stated   .  Patient Stated (pt-stated)        Start date 12/31/20 Long range goal Medium priority Estimated end date 04/03/21  Follow up date 04/03/21  Patient will report improved  coping mechanisms with counseling and improved general well-being in 3 months. 02/03/21 Still has not been able to see a professional. Encouraged pt to talk with me when she is having difficulty for reassurance and suggestions. No SI.      Other   .  Patient Stated        Long term goal High priority Started on 06/21/20, renewed 12/03/20 Projected end date: 04/03/21  Will call MD, PA or NP providers to report any new health issues early to avoid complications and hospitalizations.   09/30/20 Suzanne Galloway is always very good to reach out and discuss medical and or emotional issues for resolution. 10/31/20 Pt is stable. She has seen her primary care provider and pharmacist during the last 30 days. One med change effexor to pristiq as the first was not having an adequate effect. 12/03/20 Renewed this goal today as continued vigilance and early outreach to her medical provider with complications is paramount to avoid complications and reinforced to pt who agrees and promises she will do so. No hospitalization since last September. 12/31/20. Pt calls NP with questions or concerns and discussed issues freely and does follow through with recommendations. 02/03/21 Pt called to report being sick since Friday, nausea, vomiting, diarrhea, no fever, but starting to feel better today. She reports eating at church pot luck on Friday and she has a hx of IBS and a hiatal hernia. We reviewed her meds for her GI issues, diet. She uses lactose free dairy products and does eat fruits and vegetables. She does eat more gas producing food than she should. Agrees to reduce these. Providing pt education on IBS and Hiatal Hernia. 02/05/21 Encouraged pushing fluids as unable to eat anything solid. Go to urgent care if sxs cannot wait until appt on Monday.      ASSESSMENT: Known IBS with diarrhea but occasional constipation                            Hiatal Hernia - wonder if this is changing or if has gastric volulous?                  GERD                            ? Partial fecal impactions at times                            CAD with 2 MIs and angioplasties  PLAN: Advised pt to keep well hydrated. Don't eat before lying down. Avoid gas producing foods. Continue current medication regimen. Prepare for  appt on Monday. Write down all questions. Keep diary of sxs and take to appt.   Sending this note to Dr. Olen Pel today for review.  [name]. Myrtie Neither, MSN, [facility] Gerontological Nurse Practitioner [facility] Care Management [phone]

## 2021-02-10 DIAGNOSIS — I25119 Atherosclerotic heart disease of native coronary artery with unspecified angina pectoris: Secondary | ICD-10-CM | POA: Diagnosis not present

## 2021-02-10 DIAGNOSIS — E782 Mixed hyperlipidemia: Secondary | ICD-10-CM | POA: Diagnosis not present

## 2021-02-10 DIAGNOSIS — I10 Essential (primary) hypertension: Secondary | ICD-10-CM | POA: Diagnosis not present

## 2021-02-10 DIAGNOSIS — E039 Hypothyroidism, unspecified: Secondary | ICD-10-CM | POA: Diagnosis not present

## 2021-02-14 DIAGNOSIS — I252 Old myocardial infarction: Secondary | ICD-10-CM | POA: Diagnosis not present

## 2021-02-14 DIAGNOSIS — I10 Essential (primary) hypertension: Secondary | ICD-10-CM | POA: Diagnosis not present

## 2021-02-14 DIAGNOSIS — I255 Ischemic cardiomyopathy: Secondary | ICD-10-CM | POA: Diagnosis not present

## 2021-02-14 DIAGNOSIS — E782 Mixed hyperlipidemia: Secondary | ICD-10-CM | POA: Diagnosis not present

## 2021-02-14 DIAGNOSIS — I2510 Atherosclerotic heart disease of native coronary artery without angina pectoris: Secondary | ICD-10-CM | POA: Diagnosis not present

## 2021-02-17 ENCOUNTER — Telehealth: Payer: Self-pay | Admitting: Family Medicine

## 2021-02-17 NOTE — Telephone Encounter (Signed)
Noted! TY. Will follow up pending appt with Dr Leta Baptist tomorrow.

## 2021-02-17 NOTE — Telephone Encounter (Signed)
I called pts daughter, [name].  Pt had seizure yesterday (per pt GTC, did not black out, she stated did last about one minute. Was not witness.  Compliant with her meds vimpat 50mg  po bid, zonisamide 200mg  po qhs.  Did have one beer yesterday and 2 beers the previous day.  This could be trigger.  Made appt for f/u. Last seen 09-2020. Instructed not to drive per [place] for 6 months.  Appreciated callback.

## 2021-02-17 NOTE — Telephone Encounter (Signed)
Pt's daughter, Perley Jain (on [place]) called, my mother had seizure yesterday afternoon. Was a all body seizure, lasted a minute. She was awake during the seizure. Would like a call from the nurse.

## 2021-02-18 ENCOUNTER — Ambulatory Visit: Payer: Medicare Other | Admitting: Diagnostic Neuroimaging

## 2021-02-18 ENCOUNTER — Encounter: Payer: Self-pay | Admitting: Diagnostic Neuroimaging

## 2021-02-18 ENCOUNTER — Other Ambulatory Visit: Payer: Self-pay

## 2021-02-18 VITALS — BP 114/63 | HR 68 | Ht 62.0 in | Wt 133.0 lb

## 2021-02-18 DIAGNOSIS — G40109 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, not intractable, without status epilepticus: Secondary | ICD-10-CM

## 2021-02-18 NOTE — Patient Instructions (Signed)
LOCALIZATION EPILEPSY (last major sz event ~Dec 2018; weekly deja vu spells) - continue zonisamide 200mg  at bedtime + vimpat 50mg  twice a day  - recent spell on 02/16/21 not likely seizure (more likely presyncope event vs stress event); recommend to hold off on driving at least 3 months  MEMORY LOSS (mild) - likely related to seizure d/o, seizure meds and mood d/o - supportive care  ANXIETY DISORDER + DEPRESSION (worsening) - follow up with psychiatry / psychology

## 2021-02-18 NOTE — Progress Notes (Signed)
[facility]  PATIENT: Suzanne Galloway DOB: 1941/03/06  REFERRING CLINICIAN: Electa Sniff HISTORY FROM: patient  REASON FOR VISIT: follow up    HISTORICAL  CHIEF COMPLAINT:  Chief Complaint  Patient presents with  . Follow-up    Pt with daughter, rm 6. Presents today following up. Last wk went to MD and they were in process of switching to pristiq to zoloft. Instead she actually took half of zoloft and then half of xanax (cold [place] on pristiq) and this seizure event occurred Sunday night. She had actual convulsing with seizure. She was aware during the event. Seizure was unwitnessed. They are unsure if r/t to medication switch but that was only thing different    HISTORY OF PRESENT ILLNESS:   UPDATE (02/18/21, VRP): Since last visit, doing well except this past weekend. More stress, anxiety, depression, was in process of changing pristiq to zoloft, then on Sunday was at home, stood up, felt lightheaded. Then woozy, felts large desynchronized movements in arms and legs, laid on to bed. Was awake and aware through the event, 1 minute. Then tired, but back to baseline within 10-15 minutes. Had dinner with family and they did not notice any issues (patient did not tell them about the event). Deja vu spells stable.     UPDATE (10/03/19, VRP): Since last visit, more stress and tragedy from her friends medical issues. More panic and anxiety and stomach issues. Deja vu spells continue, slightly more so patient increased her zonisamide. Symptoms are otherwise stable.   UPDATE (05/29/19, VRP): Since last visit, doing poorly according to family. Continues with depression and anxiety. Severity is moderate. No alleviating or aggravating factors. Tolerating meds. Weekly deja vu spells. No major seizures. Some intermittent memory loss.   UPDATE (08/10/18, VRP): Since last visit, struggling with anxiety and depression sxs. No def sz except mild deja vu spell 1 week ago. No alleviating or  aggravating factors. Tolerating meds. Psychiatry has increased lexapro 1 month ago.  PRIOR HPI (02/01/18): 80 year old female here for evaluation of seizures.  February 10, 2012 was a stressful year for patient.  Patient was taking care of her husband who ultimately passed away.  Then in 02/09/13 she began to have intermittent episodes of dj vu, anxiety, hot sensation in her body, a liquid sensation going down her chest and abdomen.  Episodes can last minutes at a time.  These occurred every few weeks.  Over time the increased in frequency.  These were attributed to anxiety and patient was treated with Lexapro, Wellbutrin and Ativan.  In 02/10/15 patient was evaluated by neurologist for tremor.  She also reported episodes of dj vu and therefore EEG was ordered.  No epileptiform discharges were found.  This is followed up with ambulatory EEG which was also normal.  May 2018 patient presented to the hospital with retrograde memory loss, patient thinking it was Feb 10, 2015, no recent memory of a beach trip recently, and other confusion.  Hospital EEG showed occasional sharp wave discharges in the right posterior frontal region.  Patient was then referred to [facility] for video EEG monitoring.  Occasional right temporal sharp wave activity was noted.  Patient had 2 electrographic seizures.  Patient was started on Lamictal but then this was discontinued due to lack of effectiveness and possible side effect.  This was changed to oxcarbazepine but this caused paranoia and confusion.  Patient was then started on zonisamide.  Patient developed some tremor which was initially thought to be anxiety related, but then possibly  related to zonisamide, and therefore dose was slightly reduced.  Last event of possible seizure occurred 10/13/17, with memory lapse, confusion, dj vu sensation, anxiety sensation.  Patient currently on zonisamide 200 mg at bedtime for seizure prevention.  No significant tremors.  She is also on Lexapro 20 mg/day  for anxiety and depression.  She is on lorazepam 0.5 mg twice a day for anxiety.  Patient still feels quite anxious and depressed at times, related to being stuck at home.  However when she is able to get out of the house she feels like going back home.  She is working with a Teacher, music and psychologist now.  Patient then requested a second opinion with our office.    REVIEW OF SYSTEMS: Full 14 system review of systems performed and negative with exception of: as per HPI.   ALLERGIES: Allergies  Allergen Reactions  . Codeine Other (See Comments)    "makes me crazy"  . Lamictal [Lamotrigine] Other (See Comments)    Increased siezure activity.  . Oxcarbazepine Other (See Comments)    Over sedation  . Symbicort [Budesonide-Formoterol Fumarate] Other (See Comments)    Had 2 "seizures" after taking.   . Atorvastatin   . Buspirone     Increased depression  . Carbamazepine Other (See Comments)    Paranoia, confusion  . Depakote [Divalproex Sodium] Nausea And Vomiting    lethargy  . Dilaudid [Hydromorphone] Other (See Comments)    Reaction:  Hallucinations   . Dulera [Mometasone Furo-Formoterol Fum] Other (See Comments)    Reaction:  Deja vu seizures   . Lactose Intolerance (Gi) Diarrhea  . Morphine And Related Nausea And Vomiting  . Percocet [Oxycodone-Acetaminophen] Other (See Comments)    Reaction:  Hallucinations   . Pravastatin Other (See Comments)    Reaction:  Muscle pain, "weird dreams"  . Vesicare [Solifenacin] Other (See Comments)    Reaction:  Constipation   . Zonisamide Other (See Comments)    At higher doses can cause tremors, anxiety    HOME MEDICATIONS: Outpatient Medications Prior to Visit  Medication Sig Dispense Refill  . ALPRAZolam (XANAX) 0.25 MG tablet 1 tablet    . Ascorbic Acid (VITAMIN C PO) Take 1 tablet by mouth daily.    Marland Kitchen aspirin 81 MG chewable tablet Chew 81 mg by mouth in the morning and at bedtime.     Marland Kitchen atorvastatin (LIPITOR) 80 MG tablet  Take 80 mg by mouth daily.    . Calcium Carbonate-Vitamin D 600-400 MG-UNIT tablet Take 1 tablet by mouth daily.    Marland Kitchen desvenlafaxine (PRISTIQ) 50 MG 24 hr tablet Take 25 mg by mouth daily. Weaning off and will be off completely    . diphenoxylate-atropine (LOMOTIL) 2.5-0.025 MG tablet 1 tablet as needed    . famotidine (PEPCID) 40 MG tablet Take 40 mg by mouth 2 (two) times daily.     Marland Kitchen lacosamide ([name]) 50 MG TABS tablet TAKE ONE TABLET BY MOUTH TWICE DAILY 180 tablet 3  . levothyroxine (SYNTHROID) 75 MCG tablet Take 75 mcg by mouth every morning.     . loperamide (IMODIUM A-D) 2 MG tablet Take 2 mg by mouth 4 (four) times daily as needed for diarrhea or loose stools.    . metoprolol succinate (TOPROL-XL) 25 MG 24 hr tablet Take 25 mg by mouth daily.    . Multiple Vitamins-Minerals (PRESERVISION AREDS PO) Take 1 tablet by mouth daily.     Vladimir Faster Glycol-Propyl Glycol (SYSTANE OP) Apply 1 drop to eye  daily as needed.    . sacubitril-valsartan (ENTRESTO) 24-26 MG Take 1 tablet by mouth 2 (two) times daily.     . sertraline (ZOLOFT) 50 MG tablet Take 1 tablet by mouth daily.    Marland Kitchen spironolactone (ALDACTONE) 25 MG tablet Take 25 mg by mouth daily.    . sucralfate (CARAFATE) 1 g tablet Take 1 g by mouth 4 (four) times daily -  with meals and at bedtime.    . ticagrelor (BRILINTA) 90 MG TABS tablet Take 90 mg by mouth 2 (two) times daily.     Marland Kitchen zonisamide (ZONEGRAN) 100 MG capsule Take 2 capsules (200 mg total) by mouth at bedtime. 180 capsule 4  . escitalopram (LEXAPRO) 20 MG tablet Take 20 mg by mouth daily.  11  . isosorbide mononitrate (IMDUR) 30 MG 24 hr tablet Take 30 mg by mouth daily.    . nitroGLYCERIN (NITROSTAT) 0.4 MG SL tablet Place 0.4 mg under the tongue every 5 (five) minutes as needed for chest pain. (Patient not taking: Reported on 02/18/2021)     No facility-administered medications prior to visit.    PAST MEDICAL HISTORY: Past Medical History:  Diagnosis Date  . Anxiety    . Arthritis    "hands, back, toes" (01/01/2015)  . Asthma   . Chronic bronchitis ([place])    "get it just about q yr" (01/01/2015)  . Coronary artery disease    S/P STEMI 03/10/20, DES LAD, VFib Arrest  . Depression   . GERD (gastroesophageal reflux disease)   . Hearing loss    hearing aids  . High cholesterol   . Hyperlipemia   . Hypertension   . Hypothyroidism   . IBS (irritable bowel syndrome)   . Mitral valve prolapse   . Seizures [facility])    dx Aug 2018, most recent seizure 10/13/17, partial complex  . Urinary incontinence     PAST SURGICAL HISTORY: Past Surgical History:  Procedure Laterality Date  . BACK SURGERY    . CARDIAC CATHETERIZATION    . CARPAL TUNNEL RELEASE Bilateral 2008  . CYSTECTOMY Right 09/2014   "hand"   . DILATION AND CURETTAGE OF UTERUS    . FINGER SURGERY Bilateral    "scraped arthritis out of thumbs"  . INCONTINENCE SURGERY    . LAPAROSCOPIC CHOLECYSTECTOMY    . LUMBAR LAMINECTOMY  1987; 12/2009   ; Archie Endo 01/16/2010  . TOE SURGERY Left 09/21/2005   "took bone out of 2nd toe; took cyst out"  . TUBAL LIGATION    . VAGINAL HYSTERECTOMY  1970    FAMILY HISTORY: Family History  Problem Relation Age of Onset  . COPD Father        smoked  . Heart disease Father   . Esophageal cancer Father        smoked  . Transient ischemic attack Father   . Cancer Father        kidney  . Deep vein thrombosis Brother   . Parkinson's disease Mother   . Heart failure Mother   . Diabetes Mother   . Cancer Daughter   . Diabetes Maternal Grandmother   . Diabetes Paternal Grandfather     SOCIAL HISTORY:  Social History   Socioeconomic History  . Marital status: Widowed    Spouse name: Not on file  . Number of children: 3  . Years of education: 13  . Highest education level: GED or equivalent  Occupational History  . 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 Smoking status: Never Smoker  . Smokeless tobacco: Never Used   Substance and Sexual Activity  . Alcohol use: Yes    Comment: 01/01/2015 "mixed drink q 2 months or so", 05/29/19 quit 2016  . Drug use: No  . Sexual activity: Not Currently  Other Topics Concern  . Not on file  Social History Narrative   10/03/19 Lives with dgtr, [name] and son-in-law    Retired, widowed   Education- high school   Caffeine 1 cup daily   Social Determinants of Health   Financial Resource Strain: Not on file  Food Insecurity: No Food Insecurity  . Worried About Charity fundraiser in the Last Year: Never true  . Ran Out of Food in the Last Year: Never true  Transportation Needs: No Transportation Needs  . Lack of Transportation (Medical): No  . Lack of Transportation (Non-Medical): No  Physical Activity: Not on file  Stress: Not on file  Social Connections: Not on file  Intimate Partner Violence: Not on file     PHYSICAL EXAM   GENERAL EXAM/CONSTITUTIONAL: Vitals:  Vitals:   02/18/21 0935  BP: 114/63  Pulse: 68  Weight: 133 lb (60.3 kg)  Height: 5\' 2"  (1.575 m)   Wt Readings from Last 9 Encounters:  02/18/21 133 lb (60.3 kg)  10/14/20 132 lb (59.9 kg)  09/09/20 132 lb 7.9 oz (60.1 kg)  05/23/20 129 lb 13.6 oz (58.9 kg)  10/03/19 129 lb (58.5 kg)  05/29/19 130 lb 6.4 oz (59.1 kg)  08/10/18 133 lb 12.8 oz (60.7 kg)  04/13/18 137 lb (62.1 kg)  02/22/18 134 lb 12.8 oz (61.1 kg)    Body mass index is 24.33 kg/m. No exam data present  Patient is in no distress; well developed, nourished and groomed; neck is supple  CARDIOVASCULAR:  Examination of carotid arteries is normal; no carotid bruits  Regular rate and rhythm, no murmurs  Examination of peripheral vascular system by observation and palpation is normal  EYES:  Ophthalmoscopic exam of optic discs and posterior segments is normal; no papilledema or hemorrhages  MUSCULOSKELETAL:  Gait, strength, tone, movements noted in Neurologic exam below  NEUROLOGIC: MENTAL STATUS:  MMSE -  Mini Mental State Exam 10/14/2020 10/03/2019 05/29/2019  Not completed: - - -  Orientation to time 5 4 5   Orientation to Place 5 4 5   Registration 3 3 3   Attention/ Calculation 1 1 1   Recall 3 3 2   Language- name 2 objects 2 2 2   Language- repeat 1 1 1   Language- follow 3 step command 3 3 3   Language- read & follow direction 1 1 1   Write a sentence 1 1 1   Copy design 1 1 0  Copy design-comments 9 animals - -  Total score 26 24 24     awake, alert, oriented to person, place and time  recent and remote memory intact  normal attention and concentration  language fluent, comprehension intact, naming intact,   fund of knowledge appropriate  CRANIAL NERVE:   2nd - no papilledema on fundoscopic exam  2nd, 3rd, 4th, 6th - pupils equal and reactive to light, visual fields full to confrontation, extraocular muscles intact, no nystagmus  5th - facial sensation symmetric  7th - facial strength symmetric  8th - hearing intact  9th - palate elevates symmetrically, uvula midline  11th - shoulder shrug symmetric  12th - tongue protrusion midline  MOTOR:   normal bulk and tone, full  strength in the BUE, BLE  SENSORY:   normal and symmetric to light touch, temperature, vibration  COORDINATION:   finger-nose-finger, fine finger movements normal  REFLEXES:   deep tendon reflexes present and symmetric  GAIT/STATION:   narrow based gait; romberg is negative    DIAGNOSTIC DATA (LABS, IMAGING, TESTING) - I reviewed patient records, labs, notes, testing and imaging myself where available.  Lab Results  Component Value Date   WBC 7.3 02/19/2017   HGB 12.5 02/19/2017   HCT 37.3 02/19/2017   MCV 92.1 02/19/2017   PLT 260 02/19/2017      Component Value Date/Time   NA 138 02/19/2017 0935   K 4.6 02/19/2017 0935   CL 101 02/19/2017 0935   CO2 28 02/19/2017 0935   GLUCOSE 112 (H) 02/19/2017 0935   BUN 14 02/19/2017 0935   CREATININE 0.87 02/19/2017 0935    CREATININE 0.82 12/27/2014 0714   CALCIUM 9.4 02/19/2017 0935   PROT 7.4 02/19/2017 0935   ALBUMIN 4.6 02/19/2017 0935   AST 28 02/19/2017 0935   ALT 21 02/19/2017 0935   ALKPHOS 69 02/19/2017 0935   BILITOT 0.4 02/19/2017 0935   GFRNONAA >60 02/19/2017 0935   GFRAA >60 02/19/2017 0935   Lab Results  Component Value Date   CHOL 174 01/02/2015   HDL 37 (L) 01/02/2015   LDLCALC 105 (H) 01/02/2015   TRIG 158 (H) 01/02/2015   CHOLHDL 4.7 01/02/2015   Lab Results  Component Value Date   HGBA1C 6.3 (H) 01/03/2015   Lab Results  Component Value Date   VITAMINB12 312 02/20/2017   Lab Results  Component Value Date   TSH 10.174 (H) 02/20/2017    02/19/17 MRI brain [I reviewed images myself and agree with interpretation. -VRP]  - Atrophy and small vessel disease.  No acute intracranial findings. - No significant progression from 2016.  08/20/15 24 hour EEG IMPRESSION: This 24-hour ambulatory EEG study is normal.   CLINICAL CORRELATION: A normal EEG does not exclude a clinical diagnosis of epilepsy.  Typical events were not captured. If further clinical questions remain, inpatient video EEG monitoring may be helpful.  05/10/17 VEEG ([facility] report) - Patient admitted. Not on any anti-seizure drugs. Sleep deprived. Had one of her typical spells of deja vu, temperature change, with associated seizure arising right temporal region. Had another more severe event with post-ictal confusion, that unfortunately was not captured on camera, but again arose right temporal. Had right temporal sharps through her whole recording indicating an increased risk of seizures.     ASSESSMENT AND PLAN  80 y.o. year old female here with suspected right temporal lobe seizures, consisting of dj vu sensation, temperature change, some memory lapse and confusion.  Also with comorbid anxiety and depression.  Meds tried: Lamictal (ineffective, side effects), oxcarbazepine (paranoia, confusion), zonisamide (tremor,  anxiety), Depakote (lethargy, nausea), vimpat  Dx:  1. 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    Penni Bombard, MD 05/20/4234, 3:61 AM Certified in Neurology, Neurophysiology and Neuroimaging  [facility] [address], [place], [place] [address] [phone]

## 2021-02-26 ENCOUNTER — Other Ambulatory Visit: Payer: Self-pay | Admitting: *Deleted

## 2021-02-26 ENCOUNTER — Other Ambulatory Visit: Payer: Self-pay

## 2021-02-26 NOTE — Patient Outreach (Signed)
[facility]) Care Management  [place]  02/26/2021   Suzanne Galloway 1941-02-28 [id number]  Subjective:  Telephone outreach  Encounter Medications:  Outpatient Encounter Medications as of 02/26/2021  Medication Sig Note  . ALPRAZolam (XANAX) 0.25 MG tablet 1 tablet   . Ascorbic Acid (VITAMIN C PO) Take 1 tablet by mouth daily.   Marland Kitchen aspirin 81 MG chewable tablet Chew 81 mg by mouth in the morning and at bedtime.    Marland Kitchen atorvastatin (LIPITOR) 80 MG tablet Take 80 mg by mouth daily.   . Calcium Carbonate-Vitamin D 600-400 MG-UNIT tablet Take 1 tablet by mouth daily.   Marland Kitchen desvenlafaxine (PRISTIQ) 50 MG 24 hr tablet Take 25 mg by mouth daily. Weaning off and will be off completely   . diphenoxylate-atropine (LOMOTIL) 2.5-0.025 MG tablet 1 tablet as needed   . famotidine (PEPCID) 40 MG tablet Take 40 mg by mouth 2 (two) times daily.    Marland Kitchen lacosamide ([name]) 50 MG TABS tablet TAKE ONE TABLET BY MOUTH TWICE DAILY   . levothyroxine (SYNTHROID) 75 MCG tablet Take 75 mcg by mouth every morning.    . loperamide (IMODIUM A-D) 2 MG tablet Take 2 mg by mouth 4 (four) times daily as needed for diarrhea or loose stools.   . metoprolol succinate (TOPROL-XL) 25 MG 24 hr tablet Take 25 mg by mouth daily.   . Multiple Vitamins-Minerals (PRESERVISION AREDS PO) Take 1 tablet by mouth daily.    . nitroGLYCERIN (NITROSTAT) 0.4 MG SL tablet Place 0.4 mg under the tongue every 5 (five) minutes as needed for chest pain. (Patient not taking: Reported on 02/18/2021) 05/16/2020: Has not needed to take  . Polyethyl Glycol-Propyl Glycol (SYSTANE OP) Apply 1 drop to eye daily as needed.   . sacubitril-valsartan (ENTRESTO) 24-26 MG Take 1 tablet by mouth 2 (two) times daily.    . sertraline (ZOLOFT) 50 MG tablet Take 1 tablet by mouth daily.   Marland Kitchen spironolactone (ALDACTONE) 25 MG tablet Take 25 mg by mouth daily.   . sucralfate (CARAFATE) 1 g tablet Take 1 g by mouth 4 (four) times daily -  with meals and at  bedtime.   . ticagrelor (BRILINTA) 90 MG TABS tablet Take 90 mg by mouth 2 (two) times daily.    Marland Kitchen zonisamide (ZONEGRAN) 100 MG capsule Take 2 capsules (200 mg total) by mouth at bedtime.    No facility-administered encounter medications on file as of 02/26/2021.    Functional Status:  No flowsheet data found.  Fall/Depression Screening: Fall Risk  04/06/2020 08/10/2018 04/13/2018  Falls in the past year? 0 No No  Number falls in past yr: 0 - -  Injury with Fall? 0 - -   PHQ 2/9 Scores 09/11/2020 05/27/2020 05/23/2020 04/06/2020 06/11/2017  PHQ - 2 Score 0 1 1 2 2   PHQ- 9 Score - - - 4 5    Assessment: IBS                        Depression                        CAD  Goals Addressed              This Visit's Progress     Patient Stated   .  Patient Stated (pt-stated)   On track     Start date 12/31/20 Long range goal Medium priority Estimated end date 04/03/21  Follow up date 04/03/21  Patient will report improved coping mechanisms with counseling and improved general well-being in 3 months. 02/03/21 Still has not been able to see a professional. Encouraged pt to talk with me when she is having difficulty for reassurance and suggestions. No SI. 02/26/21 Daughter has found a new provider and pt may be seen at the end of the month. She will need transportation when her daughter is working. Pt to call [employer] to see if they offer transportation to MD appts, if not will refer to [facility].      Other   .  Patient Stated   On track     Long term goal High priority Started on 06/21/20, renewed 12/03/20 Projected end date: 04/03/21  Will call MD, PA or NP providers to report any new health issues early to avoid complications and hospitalizations.   09/30/20 [name] is always very good to reach out and discuss medical and or emotional issues for resolution. 10/31/20 Pt is stable. She has seen her primary care provider and pharmacist during the last 30 days. One med  change effexor to pristiq as the first was not having an adequate effect. 12/03/20 Renewed this goal today as continued vigilance and early outreach to her medical provider with complications is paramount to avoid complications and reinforced to pt who agrees and promises she will do so. No hospitalization since last September. 12/31/20. Pt calls NP with questions or concerns and discussed issues freely and does follow through with recommendations. 02/03/21 Pt called to report being sick since Friday, nausea, vomiting, diarrhea, no fever, but starting to feel better today. She reports eating at church pot luck on Friday and she has a hx of IBS and a hiatal hernia. We reviewed her meds for her GI issues, diet. She uses lactose free dairy products and does eat fruits and vegetables. She does eat more gas producing food than she should. Agrees to reduce these. Providing pt education on IBS and Hiatal Hernia. 02/05/21 Encouraged pushing fluids as unable to eat anything solid. Go to urgent care if sxs cannot wait until appt on Monday. 02/26/21 Has not needed to go to urgent care or ED. Continues to have sxs IBS with loose stools. Takes her meds as ordered to control sxs.  No cardiac complaints and will see cardiologist today. Reinforced availability of NP for consultation.       Plan:  Pt to report to NP about cardiology appt and we will have our next conversation in 2 weeks. Follow-up:  Patient agrees to Care Plan and Follow-up.   [name]. Myrtie Neither, MSN, [facility] Gerontological Nurse Practitioner [facility] Care Management [phone]

## 2021-02-28 DIAGNOSIS — I255 Ischemic cardiomyopathy: Secondary | ICD-10-CM | POA: Diagnosis not present

## 2021-02-28 DIAGNOSIS — I252 Old myocardial infarction: Secondary | ICD-10-CM | POA: Diagnosis not present

## 2021-02-28 DIAGNOSIS — E782 Mixed hyperlipidemia: Secondary | ICD-10-CM | POA: Diagnosis not present

## 2021-02-28 DIAGNOSIS — I2510 Atherosclerotic heart disease of native coronary artery without angina pectoris: Secondary | ICD-10-CM | POA: Diagnosis not present

## 2021-02-28 DIAGNOSIS — I10 Essential (primary) hypertension: Secondary | ICD-10-CM | POA: Diagnosis not present

## 2021-03-10 ENCOUNTER — Telehealth: Payer: Self-pay | Admitting: Internal Medicine

## 2021-03-10 NOTE — Telephone Encounter (Signed)
The patient has dropped off a POLST form to be filled out and signed by Dr. Parks Neptune .    The form has been placed in the forms folder/box.     The patient would like the following action taken when with the form is complete:    1) Call the patient to pick up form:  yes  Phone:  [phone]      Please contact the patient to let her know that the above requests has been completed.

## 2021-03-12 ENCOUNTER — Other Ambulatory Visit: Payer: Self-pay | Admitting: *Deleted

## 2021-03-12 ENCOUNTER — Other Ambulatory Visit: Payer: Self-pay

## 2021-03-12 NOTE — Telephone Encounter (Signed)
POLST placed in MD's in-box to review and sign.  Routed to PCP.   Leodis Sias MA

## 2021-03-12 NOTE — Patient Outreach (Signed)
[facility]) Care Management  [place]  03/12/2021   Suzanne Galloway 10/21/1940 [id number]  Subjective: Telephone oureach to follow up on GI problems.  Encounter Medications:  Outpatient Encounter Medications as of 03/12/2021  Medication Sig Note  . ALPRAZolam (XANAX) 0.25 MG tablet 1 tablet   . Ascorbic Acid (VITAMIN C PO) Take 1 tablet by mouth daily.   Marland Kitchen aspirin 81 MG chewable tablet Chew 81 mg by mouth in the morning and at bedtime.    Marland Kitchen atorvastatin (LIPITOR) 80 MG tablet Take 80 mg by mouth daily.   . Calcium Carbonate-Vitamin D 600-400 MG-UNIT tablet Take 1 tablet by mouth daily.   Marland Kitchen desvenlafaxine (PRISTIQ) 50 MG 24 hr tablet Take 25 mg by mouth daily. Weaning off and will be off completely   . diphenoxylate-atropine (LOMOTIL) 2.5-0.025 MG tablet 1 tablet as needed   . famotidine (PEPCID) 40 MG tablet Take 40 mg by mouth 2 (two) times daily.    Marland Kitchen lacosamide ([name]) 50 MG TABS tablet TAKE ONE TABLET BY MOUTH TWICE DAILY   . levothyroxine (SYNTHROID) 75 MCG tablet Take 75 mcg by mouth every morning.    . loperamide (IMODIUM A-D) 2 MG tablet Take 2 mg by mouth 4 (four) times daily as needed for diarrhea or loose stools.   . metoprolol succinate (TOPROL-XL) 25 MG 24 hr tablet Take 25 mg by mouth daily.   . Multiple Vitamins-Minerals (PRESERVISION AREDS PO) Take 1 tablet by mouth daily.    . nitroGLYCERIN (NITROSTAT) 0.4 MG SL tablet Place 0.4 mg under the tongue every 5 (five) minutes as needed for chest pain. (Patient not taking: Reported on 02/18/2021) 05/16/2020: Has not needed to take  . Polyethyl Glycol-Propyl Glycol (SYSTANE OP) Apply 1 drop to eye daily as needed.   . sacubitril-valsartan (ENTRESTO) 24-26 MG Take 1 tablet by mouth 2 (two) times daily.    . sertraline (ZOLOFT) 50 MG tablet Take 1 tablet by mouth daily.   Marland Kitchen spironolactone (ALDACTONE) 25 MG tablet Take 25 mg by mouth daily.   . sucralfate (CARAFATE) 1 g tablet Take 1 g by mouth 4 (four) times  daily -  with meals and at bedtime.   . ticagrelor (BRILINTA) 90 MG TABS tablet Take 90 mg by mouth 2 (two) times daily.    Marland Kitchen zonisamide (ZONEGRAN) 100 MG capsule Take 2 capsules (200 mg total) by mouth at bedtime.    No facility-administered encounter medications on file as of 03/12/2021.   Functional Status:  No flowsheet data found.  Fall/Depression Screening: Fall Risk  04/06/2020 08/10/2018 04/13/2018  Falls in the past year? 0 No No  Number falls in past yr: 0 - -  Injury with Fall? 0 - -   PHQ 2/9 Scores 09/11/2020 05/27/2020 05/23/2020 04/06/2020 06/11/2017  PHQ - 2 Score 0 1 1 2 2   PHQ- 9 Score - - - 4 5    Assessment: GI pain                          Weakness   Depression & Anxiety  Goals Addressed              This Visit's Progress     Patient Stated   .  Patient Stated (pt-stated)        Start date 12/31/20 Long range goal Medium priority Estimated end date 04/03/21  Barriers: Support System Availability of servcies close to her home, that take  her insurance are very limited.  Follow up date 04/03/21  Patient will report improved coping mechanisms with counseling and improved general well-being in 3 months.  02/03/21 Still has not been able to see a professional. Encouraged pt to talk with me when she is having difficulty for reassurance and suggestions. No SI. 02/26/21 Daughter has found a new provider and pt may be seen at the end of the month. She will need transportation when her daughter is working. Pt to call [employer] to see if they offer transportation to MD appts, if not will refer to [facility]. 03/12/21 Pt continued to have physical health issues that are greatly impacting her mental well-being (not knowing what is causing her GI episodes). She does have an appt with a mental health provider mid June. Listened to and encouraged her and reinforced she can always call me for support and questions.      Other   .  Patient Stated   On track      Long term goal High priority Started on 06/21/20, renewed 12/03/20 Projected end date: 04/03/21  Barriers: None   Will call MD, PA or NP providers to report any new health issues early to avoid complications and hospitalizations.   09/30/20 [name] is always very good to reach out and discuss medical and or emotional issues for resolution. 10/31/20 Pt is stable. She has seen her primary care provider and pharmacist during the last 30 days. One med change Effexor to pristiq as the first was not having an adequate effect. 12/03/20 Renewed this goal today as continued vigilance and early outreach to her medical provider with complications is paramount to avoid complications and reinforced to pt who agrees and promises she will do so. No hospitalization since last September. 12/31/20. Pt calls NP with questions or concerns and discussed issues freely and does follow through with recommendations. 02/03/21 Pt called to report being sick since Friday, nausea, vomiting, diarrhea, no fever, but starting to feel better today. She reports eating at church pot luck on Friday and she has a hx of IBS and a hiatal hernia. We reviewed her meds for her GI issues, diet. She uses lactose free dairy products and does eat fruits and vegetables. She does eat more gas producing food than she should. Agrees to reduce these. Providing pt education on IBS and Hiatal Hernia. 02/05/21 Encouraged pushing fluids as unable to eat anything solid. Go to urgent care if sxs cannot wait until appt on Monday. 02/26/21 Has not needed to go to urgent care or ED. Continues to have sxs IBS with loose stools. Takes her meds as ordered to control sxs.  No cardiac complaints and will see cardiologist today. Reinforced availability of NP for consultation. 03/12/21 [name] continues to have GI episodes, having one now, has to be in bed because she has no energy to get up. Her MDs are aware. Her cardiologist has told her she can stop her Brilinta June 1st and  then get her colonoscopy she had needed since last year but couldn't due her MI's. She is hopeful this may reveal what is the root of these incapacitating episodes.       Plan: Will call her back in 2 weeks.  Follow-up:  Patient agrees to Care Plan and Follow-up.   [name]. Myrtie Neither, MSN, [facility] Gerontological Nurse Practitioner [facility] Care Management [phone]

## 2021-03-18 NOTE — Telephone Encounter (Signed)
Patient identification verified x3.  Patient informed form is ready for pick up at the front desk.  Teiana Hajduk MA

## 2021-03-18 NOTE — Telephone Encounter (Signed)
Done

## 2021-03-19 DIAGNOSIS — L720 Epidermal cyst: Secondary | ICD-10-CM | POA: Diagnosis not present

## 2021-03-19 DIAGNOSIS — D1801 Hemangioma of skin and subcutaneous tissue: Secondary | ICD-10-CM | POA: Diagnosis not present

## 2021-03-19 DIAGNOSIS — L821 Other seborrheic keratosis: Secondary | ICD-10-CM | POA: Diagnosis not present

## 2021-03-19 DIAGNOSIS — L820 Inflamed seborrheic keratosis: Secondary | ICD-10-CM | POA: Diagnosis not present

## 2021-03-20 DIAGNOSIS — R109 Unspecified abdominal pain: Secondary | ICD-10-CM | POA: Diagnosis not present

## 2021-03-20 DIAGNOSIS — K580 Irritable bowel syndrome with diarrhea: Secondary | ICD-10-CM | POA: Diagnosis not present

## 2021-03-21 ENCOUNTER — Other Ambulatory Visit (HOSPITAL_BASED_OUTPATIENT_CLINIC_OR_DEPARTMENT_OTHER): Payer: Self-pay | Admitting: Gastroenterology

## 2021-03-21 DIAGNOSIS — F419 Anxiety disorder, unspecified: Secondary | ICD-10-CM | POA: Diagnosis not present

## 2021-03-21 DIAGNOSIS — F3342 Major depressive disorder, recurrent, in full remission: Secondary | ICD-10-CM | POA: Diagnosis not present

## 2021-03-21 DIAGNOSIS — I10 Essential (primary) hypertension: Secondary | ICD-10-CM | POA: Diagnosis not present

## 2021-03-21 DIAGNOSIS — R109 Unspecified abdominal pain: Secondary | ICD-10-CM

## 2021-03-24 ENCOUNTER — Ambulatory Visit (INDEPENDENT_AMBULATORY_CARE_PROVIDER_SITE_OTHER): Payer: Medicare Other | Admitting: Psychologist

## 2021-03-24 DIAGNOSIS — F320 Major depressive disorder, single episode, mild: Secondary | ICD-10-CM

## 2021-04-03 ENCOUNTER — Other Ambulatory Visit: Payer: Self-pay | Admitting: *Deleted

## 2021-04-07 ENCOUNTER — Ambulatory Visit (INDEPENDENT_AMBULATORY_CARE_PROVIDER_SITE_OTHER): Payer: Medicare Other | Admitting: Psychologist

## 2021-04-07 DIAGNOSIS — F320 Major depressive disorder, single episode, mild: Secondary | ICD-10-CM | POA: Diagnosis not present

## 2021-04-07 NOTE — Patient Outreach (Signed)
[facility]) Care Management  [place]  04/07/2021   TANITH DAGOSTINO 04-18-1941 [id number]  Subjective: Telephone outreach for follow up GI problems.  Encounter Medications:  Outpatient Encounter Medications as of 04/03/2021  Medication Sig Note   pantoprazole (PROTONIX) 40 MG tablet Take 40 mg by mouth daily.    sertraline (ZOLOFT) 100 MG tablet Take 100 mg by mouth daily. Will titrate from 50 mg current, to 75, just started, and will progress to the 100 mg eventually.    ALPRAZolam (XANAX) 0.25 MG tablet 1 tablet    Ascorbic Acid (VITAMIN C PO) Take 1 tablet by mouth daily.    aspirin 81 MG chewable tablet Chew 81 mg by mouth in the morning and at bedtime.     atorvastatin (LIPITOR) 80 MG tablet Take 80 mg by mouth daily.    Calcium Carbonate-Vitamin D 600-400 MG-UNIT tablet Take 1 tablet by mouth daily.    desvenlafaxine (PRISTIQ) 50 MG 24 hr tablet Take 25 mg by mouth daily. Weaning off and will be off completely    diphenoxylate-atropine (LOMOTIL) 2.5-0.025 MG tablet 1 tablet as needed    famotidine (PEPCID) 40 MG tablet Take 40 mg by mouth 2 (two) times daily.     lacosamide ([name]) 50 MG TABS tablet TAKE ONE TABLET BY MOUTH TWICE DAILY    levothyroxine (SYNTHROID) 75 MCG tablet Take 75 mcg by mouth every morning.     loperamide (IMODIUM A-D) 2 MG tablet Take 2 mg by mouth 4 (four) times daily as needed for diarrhea or loose stools.    metoprolol succinate (TOPROL-XL) 25 MG 24 hr tablet Take 25 mg by mouth daily.    Multiple Vitamins-Minerals (PRESERVISION AREDS PO) Take 1 tablet by mouth daily.     nitroGLYCERIN (NITROSTAT) 0.4 MG SL tablet Place 0.4 mg under the tongue every 5 (five) minutes as needed for chest pain. (Patient not taking: Reported on 02/18/2021) 05/16/2020: Has not needed to take   Polyethyl Glycol-Propyl Glycol (SYSTANE OP) Apply 1 drop to eye daily as needed.    sacubitril-valsartan (ENTRESTO) 24-26 MG Take 1 tablet by mouth 2 (two) times  daily.     spironolactone (ALDACTONE) 25 MG tablet Take 25 mg by mouth daily.    sucralfate (CARAFATE) 1 g tablet Take 1 g by mouth 4 (four) times daily -  with meals and at bedtime.    ticagrelor (BRILINTA) 90 MG TABS tablet Take 90 mg by mouth 2 (two) times daily.     zonisamide (ZONEGRAN) 100 MG capsule Take 2 capsules (200 mg total) by mouth at bedtime.    [DISCONTINUED] sertraline (ZOLOFT) 50 MG tablet Take 1 tablet by mouth daily.    No facility-administered encounter medications on file as of 04/03/2021.    Functional Status:  No flowsheet data found.  Fall/Depression Screening: Fall Risk  04/06/2020 08/10/2018 04/13/2018  Falls in the past year? 0 No No  Number falls in past yr: 0 - -  Injury with Fall? 0 - -   PHQ 2/9 Scores 09/11/2020 05/27/2020 05/23/2020 04/06/2020 06/11/2017  PHQ - 2 Score 0 1 1 2 2   PHQ- 9 Score - - - 4 5    Assessment: GI problems                        Depression  Care Plan   Goals Addressed               This Visit's Progress  Patient Stated     Patient Stated (pt-stated)        Start date 12/31/20 Long range goal Medium priority Estimated end date 04/03/21, extending to 07/04/21  Barriers: Support System Availability of servcies close to her home, that take her insurance are very limited.  Follow up date 05/01/21  Patient will report improved coping mechanisms with counseling and improved general well-being in 3 months.  02/03/21 Still has not been able to see a professional. Encouraged pt to talk with me when she is having difficulty for reassurance and suggestions. No SI. 02/26/21 Daughter has found a new provider and pt may be seen at the end of the month. She will need transportation when her daughter is working. Pt to call [employer] to see if they offer transportation to MD appts, if not will refer to [facility]. 03/12/21 Pt continued to have physical health issues that are greatly impacting her mental well-being (not  knowing what is causing her GI episodes). She does have an appt with a mental health provider mid June. Listened to and encouraged her and reinforced she can always call me for support and questions. 04/03/21 Herman has met with her new therapist and she really likes him. They will meet once a month.       Other     Patient Stated        Long term goal High priority Started on 06/21/20, renewed 12/03/20, renewed 04/03/21 Projected end date: 07/04/21 Barriers: None   Will call MD, PA or NP providers to report any new health issues early to avoid complications and hospitalizations.   09/30/20 Ms. Blazejewski is always very good to reach out and discuss medical and or emotional issues for resolution. 10/31/20 Pt is stable. She has seen her primary care provider and pharmacist during the last 30 days. One med change Effexor to pristiq as the first was not having an adequate effect. 12/03/20 Renewed this goal today as continued vigilance and early outreach to her medical provider with complications is paramount to avoid complications and reinforced to pt who agrees and promises she will do so. No hospitalization since last September. 12/31/20. Pt calls NP with questions or concerns and discussed issues freely and does follow through with recommendations. 02/03/21 Pt called to report being sick since Friday, nausea, vomiting, diarrhea, no fever, but starting to feel better today. She reports eating at church pot luck on Friday and she has a hx of IBS and a hiatal hernia. We reviewed her meds for her GI issues, diet. She uses lactose free dairy products and does eat fruits and vegetables. She does eat more gas producing food than she should. Agrees to reduce these. Providing pt education on IBS and Hiatal Hernia. 02/05/21 Encouraged pushing fluids as unable to eat anything solid. Go to urgent care if sxs cannot wait until appt on Monday. 02/26/21 Has not needed to go to urgent care or ED. Continues to have sxs IBS with loose  stools. Takes her meds as ordered to control sxs.  No cardiac complaints and will see cardiologist today. Reinforced availability of NP for consultation. 03/12/21 Daisa continues to have GI episodes, having one now, has to be in bed because she has no energy to get up. Her MDs are aware. Her cardiologist has told her she can stop her Brilinta June 1st and then get her colonoscopy she had needed since last year but couldn't due her MI's. She is hopeful this may reveal what is the  root of these incapacitating episodes. 04/03/21 Laiah reports having had 11 good days in a row and then yesterday she had a relapse of her GI sxs. She is taking her meds and prns as ordered. She should have her colonoscopy before we talk again in July.         Plan: We agreed to talk again in one month. Follow-up: Patient agrees to Care Plan and Follow-up. Follow-up in 1 month(s)  Jacai Kipp C. Myrtie Neither, MSN, [facility] Gerontological Nurse Practitioner [facility] Care Management [phone]

## 2021-04-08 ENCOUNTER — Other Ambulatory Visit: Payer: Self-pay

## 2021-04-08 ENCOUNTER — Ambulatory Visit (HOSPITAL_BASED_OUTPATIENT_CLINIC_OR_DEPARTMENT_OTHER)
Admission: RE | Admit: 2021-04-08 | Discharge: 2021-04-08 | Disposition: A | Discharge status: Home / Self Care | Payer: Medicare Other | Source: Ambulatory Visit | Attending: Gastroenterology | Admitting: Gastroenterology

## 2021-04-08 ENCOUNTER — Other Ambulatory Visit (HOSPITAL_BASED_OUTPATIENT_CLINIC_OR_DEPARTMENT_OTHER): Payer: Self-pay | Admitting: Gastroenterology

## 2021-04-08 DIAGNOSIS — R109 Unspecified abdominal pain: Secondary | ICD-10-CM

## 2021-04-23 ENCOUNTER — Other Ambulatory Visit: Payer: Self-pay | Admitting: Internal Medicine

## 2021-04-25 ENCOUNTER — Ambulatory Visit (INDEPENDENT_AMBULATORY_CARE_PROVIDER_SITE_OTHER): Payer: Medicare Other | Admitting: Psychologist

## 2021-04-25 DIAGNOSIS — F320 Major depressive disorder, single episode, mild: Secondary | ICD-10-CM | POA: Diagnosis not present

## 2021-05-01 ENCOUNTER — Other Ambulatory Visit: Payer: Self-pay | Admitting: *Deleted

## 2021-05-01 NOTE — Patient Outreach (Signed)
[facility]) Care Management  [place]  05/01/2021   Suzanne Galloway 10/02/1941 [id number]  Subjective: Telephone outreach to follow up on IBS, Mental status  Encounter Medications:  Outpatient Encounter Medications as of 05/01/2021  Medication Sig Note   ALPRAZolam (XANAX) 0.25 MG tablet 1 tablet    Ascorbic Acid (VITAMIN C PO) Take 1 tablet by mouth daily.    aspirin 81 MG chewable tablet Chew 81 mg by mouth in the morning and at bedtime.     atorvastatin (LIPITOR) 80 MG tablet Take 80 mg by mouth daily.    Calcium Carbonate-Vitamin D 600-400 MG-UNIT tablet Take 1 tablet by mouth daily.    desvenlafaxine (PRISTIQ) 50 MG 24 hr tablet Take 25 mg by mouth daily. Weaning off and will be off completely    diphenoxylate-atropine (LOMOTIL) 2.5-0.025 MG tablet 1 tablet as needed    famotidine (PEPCID) 40 MG tablet Take 40 mg by mouth 2 (two) times daily.     lacosamide ([name]) 50 MG TABS tablet TAKE ONE TABLET BY MOUTH TWICE DAILY    levothyroxine (SYNTHROID) 75 MCG tablet Take 75 mcg by mouth every morning.     loperamide (IMODIUM A-D) 2 MG tablet Take 2 mg by mouth 4 (four) times daily as needed for diarrhea or loose stools.    metoprolol succinate (TOPROL-XL) 25 MG 24 hr tablet Take 25 mg by mouth daily.    Multiple Vitamins-Minerals (PRESERVISION AREDS PO) Take 1 tablet by mouth daily.     nitroGLYCERIN (NITROSTAT) 0.4 MG SL tablet Place 0.4 mg under the tongue every 5 (five) minutes as needed for chest pain. (Patient not taking: Reported on 02/18/2021) 05/16/2020: Has not needed to take   pantoprazole (PROTONIX) 40 MG tablet Take 40 mg by mouth daily.    Polyethyl Glycol-Propyl Glycol (SYSTANE OP) Apply 1 drop to eye daily as needed.    sacubitril-valsartan (ENTRESTO) 24-26 MG Take 1 tablet by mouth 2 (two) times daily.     sertraline (ZOLOFT) 100 MG tablet Take 100 mg by mouth daily. Will titrate from 50 mg current, to 75, just started, and will progress to the 100  mg eventually.    spironolactone (ALDACTONE) 25 MG tablet Take 25 mg by mouth daily.    sucralfate (CARAFATE) 1 g tablet Take 1 g by mouth 4 (four) times daily -  with meals and at bedtime.    ticagrelor (BRILINTA) 90 MG TABS tablet Take 90 mg by mouth 2 (two) times daily.     zonisamide (ZONEGRAN) 100 MG capsule Take 2 capsules (200 mg total) by mouth at bedtime.    No facility-administered encounter medications on file as of 05/01/2021.    Fall/Depression Screening: Fall Risk  04/06/2020 08/10/2018 04/13/2018  Falls in the past year? 0 No No  Number falls in past yr: 0 - -  Injury with Fall? 0 - -   PHQ 2/9 Scores 09/11/2020 05/27/2020 05/23/2020 04/06/2020 06/11/2017  PHQ - 2 Score 0 1 1 2 2   PHQ- 9 Score - - - 4 5    Assessment: IBS less bothersome                        Depression   Care Plan   Goals Addressed               This Visit's Progress     Patient Stated     Patient Stated (pt-stated)  Start date 12/31/20 Long range goal Medium priority Estimated end date 04/03/21, extending to 07/04/21  Barriers: Support System Availability of servcies close to her home, that take her insurance are very limited.  Follow up date 05/01/21  Patient will report improved coping mechanisms with counseling and improved general well-being in 3 months.  02/03/21 Still has not been able to see a professional. Encouraged pt to talk with me when she is having difficulty for reassurance and suggestions. No SI. 02/26/21 Daughter has found a new provider and pt may be seen at the end of the month. She will need transportation when her daughter is working. Pt to call [employer] to see if they offer transportation to MD appts, if not will refer to [facility]. 03/12/21 Pt continued to have physical health issues that are greatly impacting her mental well-being (not knowing what is causing her GI episodes). She does have an appt with a mental health provider mid June. Listened to  and encouraged her and reinforced she can always call me for support and questions. 04/03/21 Suzanne Galloway has met with her new therapist and she really likes him. They will meet once a month. 05/01/21 Well established with new counselor and feels well supported. Her antidepressant has been increased to 100 mg (sertraline). Encouraged exercise, talking with family and friends, prayer. Call provider if having difficulty with emotions.      Other     Will call MD, PA or NP to report any new health issues early to avoid complications and hospitalizations as evidenced by pt report/chart review through 07/04/21.        Long term goal High priority Started on 06/21/20, renewed 12/03/20, renewed 04/03/21 Projected end date: 07/04/21 Barriers: None   Will call MD, PA or NP providers to report any new health issues early to avoid complications and hospitalizations.   09/30/20 Suzanne Galloway is always very good to reach out and discuss medical and or emotional issues for resolution. 10/31/20 Pt is stable. She has seen her primary care provider and pharmacist during the last 30 days. One med change Effexor to pristiq as the first was not having an adequate effect. 12/03/20 Renewed this goal today as continued vigilance and early outreach to her medical provider with complications is paramount to avoid complications and reinforced to pt who agrees and promises she will do so. No hospitalization since last September. 12/31/20. Pt calls NP with questions or concerns and discussed issues freely and does follow through with recommendations. 02/03/21 Pt called to report being sick since Friday, nausea, vomiting, diarrhea, no fever, but starting to feel better today. She reports eating at church pot luck on Friday and she has a hx of IBS and a hiatal hernia. We reviewed her meds for her GI issues, diet. She uses lactose free dairy products and does eat fruits and vegetables. She does eat more gas producing food than she should. Agrees to reduce  these. Providing pt education on IBS and Hiatal Hernia. 02/05/21 Encouraged pushing fluids as unable to eat anything solid. Go to urgent care if sxs cannot wait until appt on Monday. 02/26/21 Has not needed to go to urgent care or ED. Continues to have sxs IBS with loose stools. Takes her meds as ordered to control sxs.  No cardiac complaints and will see cardiologist today. Reinforced availability of NP for consultation. 03/12/21 Luvern continues to have GI episodes, having one now, has to be in bed because she has no energy to get up.  Her MDs are aware. Her cardiologist has told her she can stop her Brilinta June 1st and then get her colonoscopy she had needed since last year but couldn't due her MI's. She is hopeful this may reveal what is the root of these incapacitating episodes. 04/03/21 Kaydan reports having had 11 good days in a row and then yesterday she had a relapse of her GI sxs. She is taking her meds and prns as ordered. She should have her colonoscopy before we talk again in July. 05/01/21 Has not had any acute illness during the last month. Had abdominal X-Ray vs colonoscopy as follow up for her IBS. She has been instructed to "watch what she eats and take her carafate 4 times a day." She reports she has less sxs that she had been having. Reinforced early reports of issues and regular medical check ups.        Plan:  We agreed to talk again in one month. Follow-up: Patient agrees to Care Plan and Follow-up. Follow-up in 1 month(s)  Gwynneth Fabio C. Myrtie Neither, MSN, [facility] Gerontological Nurse Practitioner [facility] Care Management [phone]

## 2021-05-09 ENCOUNTER — Ambulatory Visit (INDEPENDENT_AMBULATORY_CARE_PROVIDER_SITE_OTHER): Payer: Medicare Other | Admitting: Psychologist

## 2021-05-09 DIAGNOSIS — F320 Major depressive disorder, single episode, mild: Secondary | ICD-10-CM

## 2021-05-22 DIAGNOSIS — F419 Anxiety disorder, unspecified: Secondary | ICD-10-CM | POA: Diagnosis not present

## 2021-05-22 DIAGNOSIS — F322 Major depressive disorder, single episode, severe without psychotic features: Secondary | ICD-10-CM | POA: Diagnosis not present

## 2021-05-22 DIAGNOSIS — E039 Hypothyroidism, unspecified: Secondary | ICD-10-CM | POA: Diagnosis not present

## 2021-05-29 DIAGNOSIS — D3001 Benign neoplasm of right kidney: Secondary | ICD-10-CM | POA: Diagnosis not present

## 2021-06-02 ENCOUNTER — Other Ambulatory Visit: Payer: Self-pay | Admitting: *Deleted

## 2021-06-02 DIAGNOSIS — I700 Atherosclerosis of aorta: Secondary | ICD-10-CM | POA: Diagnosis not present

## 2021-06-02 DIAGNOSIS — N2889 Other specified disorders of kidney and ureter: Secondary | ICD-10-CM | POA: Diagnosis not present

## 2021-06-02 DIAGNOSIS — N281 Cyst of kidney, acquired: Secondary | ICD-10-CM | POA: Diagnosis not present

## 2021-06-02 DIAGNOSIS — D3001 Benign neoplasm of right kidney: Secondary | ICD-10-CM | POA: Diagnosis not present

## 2021-06-02 DIAGNOSIS — G40109 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, not intractable, without status epilepticus: Secondary | ICD-10-CM

## 2021-06-02 DIAGNOSIS — D1771 Benign lipomatous neoplasm of kidney: Secondary | ICD-10-CM | POA: Diagnosis not present

## 2021-06-02 MED ORDER — LACOSAMIDE 50 MG PO TABS: ORAL_TABLET | ORAL | 1 refills | Status: DC

## 2021-06-02 NOTE — Telephone Encounter (Signed)
Received refill request for lacosamide from [facility]. Last refilled March, refill due.

## 2021-06-03 ENCOUNTER — Other Ambulatory Visit: Payer: Self-pay | Admitting: *Deleted

## 2021-06-03 NOTE — Patient Outreach (Signed)
[facility]) Care Management  [place]  06/03/2021   Suzanne Galloway [birth year] [id number]  Subjective: Telephone outreach to follow up on GI issues and mental healh. Reports saw Dr. Olen Galloway on 05/22/21 and antidepressant medications were changed noted below.  Outpatient Encounter Medications as of 06/03/2021  Medication Sig Note   ALPRAZolam (XANAX) 0.25 MG tablet 1 tablet    Ascorbic Acid (VITAMIN C PO) Take 1 tablet by mouth daily.    aspirin 81 MG chewable tablet Chew 81 mg by mouth in the morning and at bedtime.     atorvastatin (LIPITOR) 80 MG tablet Take 80 mg by mouth daily.    Calcium Carbonate-Vitamin D 600-400 MG-UNIT tablet Take 1 tablet by mouth daily.    desvenlafaxine (PRISTIQ) 50 MG 24 hr tablet Take 25 mg by mouth daily. Weaning off and will be off completely 06/04/2021: As of 05/22/21 Pt was restarted on this medication at 50 mg daily. CCS per Dr. Olen Galloway   diphenoxylate-atropine (LOMOTIL) 2.5-0.025 MG tablet 1 tablet as needed    famotidine (PEPCID) 40 MG tablet Take 40 mg by mouth 2 (two) times daily.     lacosamide ([name]) 50 MG TABS tablet TAKE ONE TABLET BY MOUTH TWICE DAILY    levothyroxine (SYNTHROID) 75 MCG tablet Take 75 mcg by mouth every morning.     loperamide (IMODIUM A-D) 2 MG tablet Take 2 mg by mouth 4 (four) times daily as needed for diarrhea or loose stools.    metoprolol succinate (TOPROL-XL) 25 MG 24 hr tablet Take 25 mg by mouth daily.    Multiple Vitamins-Minerals (PRESERVISION AREDS PO) Take 1 tablet by mouth daily.     nitroGLYCERIN (NITROSTAT) 0.4 MG SL tablet Place 0.4 mg under the tongue every 5 (five) minutes as needed for chest pain. (Patient not taking: Reported on 02/18/2021) 05/16/2020: Has not needed to take   pantoprazole (PROTONIX) 40 MG tablet Take 40 mg by mouth daily.    Polyethyl Glycol-Propyl Glycol (SYSTANE OP) Apply 1 drop to eye daily as needed.    sacubitril-valsartan (ENTRESTO) 24-26 MG Take 1 tablet by mouth 2  (two) times daily.     spironolactone (ALDACTONE) 25 MG tablet Take 25 mg by mouth daily.    sucralfate (CARAFATE) 1 g tablet Take 1 g by mouth 4 (four) times daily -  with meals and at bedtime.    ticagrelor (BRILINTA) 90 MG TABS tablet Take 90 mg by mouth 2 (two) times daily.     zonisamide (ZONEGRAN) 100 MG capsule Take 2 capsules (200 mg total) by mouth at bedtime.    [DISCONTINUED] sertraline (ZOLOFT) 100 MG tablet Take 100 mg by mouth daily. Will titrate from 50 mg current, to 75, just started, and will progress to the 100 mg eventually.    No facility-administered encounter medications on file as of 06/03/2021.   Fall/Depression Screening: Fall Risk  04/06/2020 08/10/2018 04/13/2018  Falls in the past year? 0 No No  Number falls in past yr: 0 - -  Injury with Fall? 0 - -   PHQ 2/9 Scores 09/11/2020 05/27/2020 05/23/2020 04/06/2020 06/11/2017  PHQ - 2 Score 0 _0 PHQ- 9 Score - - - 4 5    Assessment: Chronic Left Upper Abdominal Pain, ? Possible Chronic pancreatitis                         Depression  Care Plan   Goals Addressed  This Visit's Progress     Patient Stated     Patient Stated (pt-stated)   On track     Start date 12/31/20 Long range goal Medium priority Estimated end date 04/03/21, extending to 07/04/21  Barriers: Support System Availability of servcies close to her home, that take her insurance are very limited.  Follow up date 05/01/21  Patient will report improved coping mechanisms with counseling and improved general well-being in 3 months.  02/03/21 Still has not been able to see a professional. Encouraged pt to talk with me when she is having difficulty for reassurance and suggestions. No SI. 02/26/21 Daughter has found a new provider and pt may be seen at the end of the month. She will need transportation when her daughter is working. Pt to call [employer] to see if they offer transportation to MD appts, if not will refer to [facility]. 03/12/21 Pt continued to have physical health issues that are greatly impacting her mental well-being (not knowing what is causing her GI episodes). She does have an appt with a mental health provider mid June. Listened to and encouraged her and reinforced she can always call me for support and questions. 04/03/21 Suzanne Galloway has met with her new therapist and she really likes him. They will meet once a month. 05/01/21 Well established with new counselor and feels well supported. Her antidepressant has been increased to 100 mg (sertraline). Encouraged exercise, talking with family and friends, prayer. Call provider if having difficulty with emotions. 06/03/21 Primary care titrated sertraline off and started new antidepressant: desfenlafaxine 50 mg. Her TSH was found to be high and her levothyroxine was increased to 100 mcg. Mental state is stable but she remains depressed over her health issue that she feels has not been diagnosed. NP recommended second opinion. Pt to call [employer] for potential providers for her.      Other     Will call MD, PA or NP to report any new health issues early to avoid complications and hospitalizations as evidenced by pt report/chart review through 07/04/21.   On track     Long term goal High priority Started on 06/21/20, renewed 12/03/20, renewed 04/03/21 Projected end date: 07/07/21 Barriers: None   Will call MD, PA or NP providers to report any new health issues early to avoid complications and hospitalizations.   09/30/20 Suzanne Galloway is always very good to reach out and discuss medical and or emotional issues for resolution. 10/31/20 Pt is stable. She has seen her primary care provider and pharmacist during the last 30 days. One med change Effexor to pristiq as the first was not having an adequate effect. 12/03/20 Renewed this goal today as continued vigilance and early outreach to her medical provider with complications is paramount to avoid complications and  reinforced to pt who agrees and promises she will do so. No hospitalization since last September. 12/31/20. Pt calls NP with questions or concerns and discussed issues freely and does follow through with recommendations. 02/03/21 Pt called to report being sick since Friday, nausea, vomiting, diarrhea, no fever, but starting to feel better today. She reports eating at church pot luck on Friday and she has a hx of IBS and a hiatal hernia. We reviewed her meds for her GI issues, diet. She uses lactose free dairy products and does eat fruits and vegetables. She does eat more gas producing food than she should. Agrees to reduce these. Providing pt education on IBS and Hiatal Hernia. 02/05/21 Encouraged  pushing fluids as unable to eat anything solid. Go to urgent care if sxs cannot wait until appt on Monday. 02/26/21 Has not needed to go to urgent care or ED. Continues to have sxs IBS with loose stools. Takes her meds as ordered to control sxs.  No cardiac complaints and will see cardiologist today. Reinforced availability of NP for consultation. 03/12/21 Virna continues to have GI episodes, having one now, has to be in bed because she has no energy to get up. Her MDs are aware. Her cardiologist has told her she can stop her Brilinta June 1st and then get her colonoscopy she had needed since last year but couldn't due her MI's. She is hopeful this may reveal what is the root of these incapacitating episodes. 04/03/21 Jadea reports having had 11 good days in a row and then yesterday she had a relapse of her GI sxs. She is taking her meds and prns as ordered. She should have her colonoscopy before we talk again in July. 05/01/21 Has not had any acute illness during the last month. Had abdominal X-Ray vs colonoscopy as follow up for her IBS. She has been instructed to "watch what she eats and take her carafate 4 times a day." She reports she has less sxs that she had been having. Reinforced early reports of issues and regular  medical check ups. 06/03/21 Ms. Masters continues to have episodes of upper abdominal discomfort that takes all her strength away and puts her in the bed for several days. She is not confident in her GI provider and thinks that they are just casting her aside. Discussed the need for a second opinion. Pt to call for a provider that takes her insurance and is convenient to her. Reviewed diet recommended for IBS and emphasized avoidance of gas producing food and lactose. She is to call NP when she gets her new MD and an appt. Called and left message for Dr. Olen Galloway to consider possibility of chronic pancreatitis and to order a lipase level and obtain stool for elastace.       Plan:  Follow-up: Patient agrees to Care Plan and Follow-up. Follow-up in 2 week(s)  Kayleen Memos C. Myrtie Neither, MSN, [facility] Gerontological Nurse Practitioner [facility] Care Management [phone]

## 2021-06-05 ENCOUNTER — Ambulatory Visit: Payer: Self-pay | Admitting: *Deleted

## 2021-06-17 ENCOUNTER — Other Ambulatory Visit: Payer: Self-pay | Admitting: *Deleted

## 2021-06-24 ENCOUNTER — Ambulatory Visit: Payer: Medicare Other | Admitting: Psychologist

## 2021-06-27 ENCOUNTER — Encounter: Payer: Self-pay | Admitting: Internal Medicine

## 2021-06-27 ENCOUNTER — Ambulatory Visit: Payer: Medicare Other | Admitting: Internal Medicine

## 2021-06-27 VITALS — BP 120/64 | HR 78 | Ht 62.0 in | Wt 130.0 lb

## 2021-06-27 DIAGNOSIS — E739 Lactose intolerance, unspecified: Secondary | ICD-10-CM | POA: Diagnosis not present

## 2021-06-27 DIAGNOSIS — K5900 Constipation, unspecified: Secondary | ICD-10-CM

## 2021-06-27 DIAGNOSIS — R1010 Upper abdominal pain, unspecified: Secondary | ICD-10-CM

## 2021-06-27 DIAGNOSIS — Z1211 Encounter for screening for malignant neoplasm of colon: Secondary | ICD-10-CM

## 2021-06-27 DIAGNOSIS — R1310 Dysphagia, unspecified: Secondary | ICD-10-CM | POA: Diagnosis not present

## 2021-06-27 NOTE — Progress Notes (Signed)
Chief Complaint: Abdominal pain  HPI : 80 year old female w/hx of CAD s/p PCI, GERD, IBS, hypothyroidism, partial epilepsy, asthma, HTN, mitral valve prolapse, lactose intolerance presents with abdominal pain.  She has been having abdominal pain in the upper abdomen starting 2 years ago. Every 2 weeks, the pain starts and is debilitating for 3-4 days at a time. During these episodes she cannot eat, feels very weak, and stays in bed. She can drink water during these episodes but not much else. Eventually the pain goes away on its own. She thought it was due to lactose intolerance initially, but feels that she has this under control at this time. Endorses belching and some occasional bloating. Denies N&V, diarrhea. Sometimes during these ab pain episodes, she will be constipated, and then after she passes several BMs, the pain will go away. She is typically active so these abdominal pain episodes that lead to her being sedentary are very uncharacteristic for her. Denies melena, hematochezia, consistent weight loss. Father had some sort of GI problem in the past. Denies fam hx of GI cancers. Has had some trouble swallowing 3 weeks ago once a week where she has to swallow an extra time in order to get solids and liquids down. Denies chest burning or regurgitation. She is still taking Protonix and carafate, which appears to help somewhat with her ab pain. She is taking her Protonix before breakfast once a day. Last EGD and colonoscopy in 1999 were fairly unremarkable. Prior abdominal surgeries include hysterectomy, cholecystectomy, appendectomy. She has taken Advil (4 pills) every day for the last 2 months.   Has been following with her [facility] NP since 02/03/21 regarding GI symptoms. Having some difficulty with upper abdominal pain and loose stools that are intermittent. Was told by cardiologist to stop Brilinta on 03/19/21. Has followed a lactose free diet and IBS-friendly diet to avoid gas-producing foods. Patient  is on sertraline and Pristiq  Also followed with Dr. Watt Climes from [facility] gastroenterology for the last 15 years who diagnosed her with IBS with diarrhea. Recommended that she try Gas-X, Protonix, carafate.   Past Medical History:  Diagnosis Date   Anxiety    Arthritis    "hands, back, toes" (01/01/2015)   Asthma    Chronic bronchitis ([place])    "get it just about q yr" (01/01/2015)   Coronary artery disease    S/P STEMI 03/10/20, DES LAD, VFib Arrest   Depression    GERD (gastroesophageal reflux disease)    Hearing loss    hearing aids   Heart attack ([place])    x 2 in one day   High cholesterol    Hyperlipemia    Hypertension    Hypothyroidism    IBS (irritable bowel syndrome)    Mitral valve prolapse    Seizures ([place])    dx Aug 2018, most recent seizure 10/13/17, partial complex   Urinary incontinence      Past Surgical History:  Procedure Laterality Date   [place] RELEASE Bilateral 2008   CYSTECTOMY Right 09/2014   "hand"    DILATION AND CURETTAGE OF UTERUS     FINGER SURGERY Bilateral    "scraped arthritis out of thumbs"   [place]; 12/2009   ; [facility] 01/16/2010   TOE SURGERY Left 09/21/2005   "took bone out of 2nd toe; took cyst  out"   TUBAL LIGATION     VAGINAL HYSTERECTOMY  1970   Family History  Problem Relation Age of Onset   COPD Father        smoked   Heart disease Father    Esophageal cancer Father        smoked   Transient ischemic attack Father    Cancer Father        kidney   Deep vein thrombosis Brother    Parkinson's disease Mother    Heart failure Mother    Diabetes Mother    Cancer Daughter    Diabetes Maternal Grandmother    Diabetes Paternal Grandfather    Social History   Tobacco Use   Smoking status: Never   Smokeless tobacco: Never  Vaping Use   Vaping Use: Never used  Substance Use Topics   Alcohol use:  Yes    Comment: rarely   Drug use: No   Current Outpatient Medications  Medication Sig Dispense Refill   Ascorbic Acid (VITAMIN C PO) Take 1 tablet by mouth daily.     aspirin 81 MG chewable tablet Chew 81 mg by mouth in the morning and at bedtime.      Calcium Carbonate-Vitamin D 600-400 MG-UNIT tablet Take 1 tablet by mouth daily.     desvenlafaxine (PRISTIQ) 50 MG 24 hr tablet Take 50 mg by mouth daily.     famotidine (PEPCID) 40 MG tablet Take 40 mg by mouth 2 (two) times daily.      lacosamide ([name]) 50 MG TABS tablet TAKE ONE TABLET BY MOUTH TWICE DAILY 180 tablet 1   levothyroxine (SYNTHROID) 100 MCG tablet Take 100 mcg by mouth daily before breakfast.     loperamide (IMODIUM A-D) 2 MG tablet Take 2 mg by mouth 4 (four) times daily as needed for diarrhea or loose stools.     metoprolol succinate (TOPROL-XL) 25 MG 24 hr tablet Take 25 mg by mouth daily.     Multiple Vitamins-Minerals (PRESERVISION AREDS PO) Take 1 tablet by mouth daily.      nitroGLYCERIN (NITROSTAT) 0.4 MG SL tablet Place 0.4 mg under the tongue every 5 (five) minutes as needed for chest pain.     pantoprazole (PROTONIX) 40 MG tablet Take 40 mg by mouth daily.     Polyethyl Glycol-Propyl Glycol (SYSTANE OP) Apply 1 drop to eye daily as needed.     sacubitril-valsartan (ENTRESTO) 24-26 MG Take 1 tablet by mouth 2 (two) times daily.      spironolactone (ALDACTONE) 25 MG tablet Take 25 mg by mouth daily.     sucralfate (CARAFATE) 1 g tablet Take 1 g by mouth 4 (four) times daily -  with meals and at bedtime.     zonisamide (ZONEGRAN) 100 MG capsule Take 2 capsules (200 mg total) by mouth at bedtime. 180 capsule 4   No current facility-administered medications for this visit.   Allergies  Allergen Reactions   Codeine Other (See Comments)    "makes me crazy"   Lamictal [Lamotrigine] Other (See Comments)    Increased siezure activity.   Oxcarbazepine Other (See Comments)    Over sedation   Symbicort  [Budesonide-Formoterol Fumarate] Other (See Comments)    Had 2 "seizures" after taking.    Atorvastatin    Buspirone     Increased depression   Carbamazepine Other (See Comments)    Paranoia, confusion   Depakote [Divalproex Sodium] Nausea And Vomiting    lethargy   Dilaudid [Hydromorphone] Other (See  Comments)    Reaction:  Hallucinations    Dulera [Mometasone Furo-Formoterol Fum] Other (See Comments)    Reaction:  Deja vu seizures    Lactose Intolerance (Gi) Diarrhea   Morphine And Related Nausea And Vomiting   Percocet [Oxycodone-Acetaminophen] Other (See Comments)    Reaction:  Hallucinations    Pravastatin Other (See Comments)    Reaction:  Muscle pain, "weird dreams"   Vesicare [Solifenacin] Other (See Comments)    Reaction:  Constipation    Zonisamide Other (See Comments)    At higher doses can cause tremors, anxiety     Review of Systems: All systems reviewed and negative except where noted in HPI.   Physical Exam: BP 120/64   Pulse 78   Ht '5\' 2"'$  (1.575 m)   Wt 130 lb (59 kg)   BMI 23.78 kg/m  Constitutional: Pleasant,well-developed, female in no acute distress. HEENT: Normocephalic and atraumatic. Conjunctivae are normal. No scleral icterus. Cardiovascular: Normal rate, regular rhythm.  Pulmonary/chest: Effort normal and breath sounds normal. No wheezing, rales or rhonchi. Abdominal: Soft, nondistended, tender in the upper abdomen. Bowel sounds active throughout. There are no masses palpable. No hepatomegaly. Extremities: no edema Neurological: Alert and oriented to person place and time. Skin: Skin is warm and dry. No rashes noted. Psychiatric: Normal mood and affect. Behavior is normal.  Labs 11/15/18: Nml lipase  Labs 07/01/20: Unremarkable CBC and CMP  Labs 08/16/20: HbA1C 6.5%  Labs 05/22/21: TSH 6.41 (H) (nml 0.34-4.5), negative GI pathogen panel, C dif negative  H pylori breath test 11/25/18: negative  TTE 02/28/21: Left Ventricle: The calculated  left ventricular ejection fraction is 50%.    Left Ventricle: Left ventricle size is normal.    Left Ventricle: Wall motion abnormalities as outlined in graphic  representation.    Aortic Valve: The aortic valve is tricuspid. There is mild sclerosis.    Aortic Valve: There is no regurgitation.    Mitral Valve: There is mild regurgitation.    Tricuspid Valve: There is mild regurgitation.    Tricuspid Valve: The right ventricular systolic pressure is normal (<36  mmHg).  KUB 04/08/21: IMPRESSION: Normal bowel gas pattern.  Moderate retained stool in the colon.  EGD 08/19/98:  Colonoscopy 08/19/98: Normal exam  Colonoscopy 04/04/02: Only half of colonoscopy was able to be performed due to patient intolerance. Procedure was normal from splenic flexure to rectum   ASSESSMENT AND PLAN:  Intermittent upper abdominal pain Constipation Dysphagia Lactose intolerance. Colon cancer screening. At this time it is unclear what is causing her episodes of upper ab pain, though would consider several possibilities including PUD (endorses NSAID use for headaches), intermittent constipation (recent KUB showed moderate stool burden), GERD (responsive to Protonix), SIBO (prior hydrogen breath test from 2020 was negative), abdominal migraine, chronic pancreatitis, IBS (not responsive to sertraline or desvenlafaxine though thus far). Patient also described some recent dysphagia. We will plan to evaluate further with EGD and colonoscopy. Her last completed colonoscopy was in 1999 so she is due for colon cancer screening.  - Start daily fiber supplement - EGD/Colonoscopy - Consider fecal elastase or daily Miralax in the future - RTC 2 months  Christia Reading, MD

## 2021-06-27 NOTE — Patient Instructions (Addendum)
If you are age 79 or older, your body mass index should be between 23-30. Your Body mass index is 23.78 kg/m. If this is out of the aforementioned range listed, please consider follow up with your Primary Care Provider. __________________________________________________________  The [place] [facility] providers would like to encourage you to use [facility] to communicate with providers for non-urgent requests or questions.  Due to long hold times on the telephone, sending your provider a message by [facility] may be a faster and more efficient way to get a response.  Please allow 48 business hours for a response.  Please remember that this is for non-urgent requests.   You have been scheduled for an endoscopy and colonoscopy. Please follow the written instructions given to you at your visit today. Please pick up your prep supplies at the pharmacy within the next 1-3 days. If you use inhalers (even only as needed), please bring them with you on the day of your procedure.  Due to recent changes in healthcare laws, you may see the results of your imaging and laboratory studies on MyChart before your provider has had a chance to review them.  We understand that in some cases there may be results that are confusing or concerning to you. Not all laboratory results come back in the same time frame and the provider may be waiting for multiple results in order to interpret others.  Please give [place] 48 hours in order for your provider to thoroughly review all the results before contacting the office for clarification of your results.   Please purchase the following medications over the counter and take as directed:  START: Benefiber one capful in 8 to 10 ounces of liquid daily.  You will follow up in our office on 09-05-2021 at 10:10am.  Thank you for entrusting me with your care and choosing [facility].  Dr Lorenso Courier

## 2021-07-01 DIAGNOSIS — F419 Anxiety disorder, unspecified: Secondary | ICD-10-CM | POA: Diagnosis not present

## 2021-07-01 DIAGNOSIS — R059 Cough, unspecified: Secondary | ICD-10-CM | POA: Diagnosis not present

## 2021-07-04 ENCOUNTER — Ambulatory Visit: Payer: Medicare Other | Admitting: Psychologist

## 2021-07-07 ENCOUNTER — Other Ambulatory Visit: Payer: Self-pay | Admitting: *Deleted

## 2021-07-07 NOTE — Patient Outreach (Signed)
[facility]) Care Management  Suzanne Galloway  07/07/2021   Suzanne Galloway 1940-12-13 [id number]  Subjective: Telephone outreach to follow up on ongoing GI complaints. Pt reports having bronchitis the past week, had telephone visit with primary and has received tx. Feeling much better. Still coughing some.  Encounter Medications:  Outpatient Encounter Medications as of 07/07/2021  Medication Sig Note   desvenlafaxine (PRISTIQ) 50 MG 24 hr tablet Take 50 mg by mouth daily. 07/07/2021: Reports dose increased to 75 mg daily.   Ascorbic Acid (VITAMIN C PO) Take 1 tablet by mouth daily.    aspirin 81 MG chewable tablet Chew 81 mg by mouth in the morning and at bedtime.     Calcium Carbonate-Vitamin D 600-400 MG-UNIT tablet Take 1 tablet by mouth daily.    famotidine (PEPCID) 40 MG tablet Take 40 mg by mouth 2 (two) times daily.     lacosamide ([name]) 50 MG TABS tablet TAKE ONE TABLET BY MOUTH TWICE DAILY    levothyroxine (SYNTHROID) 100 MCG tablet Take 100 mcg by mouth daily before breakfast.    loperamide (IMODIUM A-D) 2 MG tablet Take 2 mg by mouth 4 (four) times daily as needed for diarrhea or loose stools.    metoprolol succinate (TOPROL-XL) 25 MG 24 hr tablet Take 25 mg by mouth daily.    Multiple Vitamins-Minerals (PRESERVISION AREDS PO) Take 1 tablet by mouth daily.     nitroGLYCERIN (NITROSTAT) 0.4 MG SL tablet Place 0.4 mg under the tongue every 5 (five) minutes as needed for chest pain. 05/16/2020: Has not needed to take   pantoprazole (PROTONIX) 40 MG tablet Take 40 mg by mouth daily.    Polyethyl Glycol-Propyl Glycol (SYSTANE OP) Apply 1 drop to eye daily as needed.    sacubitril-valsartan (ENTRESTO) 24-26 MG Take 1 tablet by mouth 2 (two) times daily.     spironolactone (ALDACTONE) 25 MG tablet Take 25 mg by mouth daily.    sucralfate (CARAFATE) 1 g tablet Take 1 g by mouth 4 (four) times daily -  with meals and at bedtime.    zonisamide (ZONEGRAN) 100 MG capsule  Take 2 capsules (200 mg total) by mouth at bedtime.    No facility-administered encounter medications on file as of 07/07/2021.    Functional Status:  No flowsheet data found.  Fall/Depression Screening: Fall Risk  04/06/2020 08/10/2018 04/13/2018  Falls in the past year? 0 No No  Number falls in past yr: 0 - -  Injury with Fall? 0 - -   PHQ 2/9 Scores 09/11/2020 05/27/2020 05/23/2020 04/06/2020 06/11/2017  PHQ - 2 Score 0 1 1 2 2   PHQ- 9 Score - - - 4 5    Assessment: Resolving bronchitis   GI disturbance of unknown origin  Care Plan   Goals Addressed               This Visit's Progress     Patient Stated     COMPLETED: Patient will report improved coping mechanisms with counseling and improved general well-being within 3 months. (pt-stated)        Start date 12/31/20 Long range goal Medium priority Estimated end date 04/03/21, extending to 07/04/21  Barriers: Support System Availability of servcies close to her home, that take her insurance are very limited.  Follow up date 07/04/21 Patient will report improved coping mechanisms with counseling and improved general well-being in 3 months.  02/03/21 Still has not been able to see a professional. Encouraged pt to  talk with me when she is having difficulty for reassurance and suggestions. No SI. 02/26/21 Daughter has found a new provider and pt may be seen at the end of the month. She will need transportation when her daughter is working. Pt to call [employer] to see if they offer transportation to MD appts, if not will refer to [facility]. 03/12/21 Pt continued to have physical health issues that are greatly impacting her mental well-being (not knowing what is causing her GI episodes). She does have an appt with a mental health provider mid June. Listened to and encouraged her and reinforced she can always call me for support and questions. 04/03/21 Suzanne Galloway has met with her new therapist and she really likes him. They  will meet once a month. 05/01/21 Well established with new counselor and feels well supported. Her antidepressant has been increased to 100 mg (sertraline). Encouraged exercise, talking with family and friends, prayer. Call provider if having difficulty with emotions. 06/03/21 Primary care titrated sertraline off and started new antidepressant: desfenlafaxine 50 mg. Her TSH was found to be high and her levothyroxine was increased to 100 mcg. Mental state is stable but she remains depressed over her health issue that she feels has not been diagnosed. NP recommended second opinion. Pt to call [employer] for potential providers for her. 06/17/21 Suzanne Galloway says she is not feeling benefits from her new antidepressant regimen to date. She does have an appt to see her counselor on 06/24/21. Encouraged her social activities and exercise and to do something everyday that makes her feel GOOD! 07/07/21 Today, Suzanne Galloway reports improved mood after medication (Pristique)dose increase. Continues to see her therapist and feels these meetings are beneficial.      Other     Will call MD, PA or NP to report any new health issues early to avoid complications and hospitalizations as evidenced by pt report/chart review through 07/04/21.   On track     Long term goal High priority Started on 06/21/20, renewed 12/03/20, renewed 04/03/21 Projected end date: 07/07/21 Barriers: None  Will call MD, PA or NP providers to report any new health issues early to avoid complications and hospitalizations.   09/30/20 Suzanne Galloway is always very good to reach out and discuss medical and or emotional issues for resolution. 10/31/20 Pt is stable. She has seen her primary care provider and pharmacist during the last 30 days. One med change Effexor to pristiq as the first was not having an adequate effect. 12/03/20 Renewed this goal today as continued vigilance and early outreach to her medical provider with complications is paramount to avoid complications and reinforced  to pt who agrees and promises she will do so. No hospitalization since last September. 12/31/20. Pt calls NP with questions or concerns and discussed issues freely and does follow through with recommendations. 02/03/21 Pt called to report being sick since Friday, nausea, vomiting, diarrhea, no fever, but starting to feel better today. She reports eating at church pot luck on Friday and she has a hx of IBS and a hiatal hernia. We reviewed her meds for her GI issues, diet. She uses lactose free dairy products and does eat fruits and vegetables. She does eat more gas producing food than she should. Agrees to reduce these. Providing pt education on IBS and Hiatal Hernia. 02/05/21 Encouraged pushing fluids as unable to eat anything solid. Go to urgent care if sxs cannot wait until appt on Monday. 02/26/21 Has not needed to go to urgent care or  ED. Continues to have sxs IBS with loose stools. Takes her meds as ordered to control sxs.  No cardiac complaints and will see cardiologist today. Reinforced availability of NP for consultation. 03/12/21 Suzanne Galloway continues to have GI episodes, having one now, has to be in bed because she has no energy to get up. Her MDs are aware. Her cardiologist has told her she can stop her Brilinta June 1st and then get her colonoscopy she had needed since last year but couldn't due her MI's. She is hopeful this may reveal what is the root of these incapacitating episodes. 04/03/21 Suzanne Galloway reports having had 11 good days in a row and then yesterday she had a relapse of her GI sxs. She is taking her meds and prns as ordered. She should have her colonoscopy before we talk again in July. 05/01/21 Has not had any acute illness during the last month. Had abdominal X-Ray vs colonoscopy as follow up for her IBS. She has been instructed to "watch what she eats and take her carafate 4 times a day." She reports she has less sxs that she had been having. Reinforced early reports of issues and regular medical check  ups. 06/03/21 Suzanne Galloway continues to have episodes of upper abdominal discomfort that takes all her strength away and puts her in the bed for several days. She is not confident in her GI provider and thinks that they are just casting her aside. Discussed the need for a second opinion. Pt to call for a provider that takes her insurance and is convenient to her. Reviewed diet recommended for IBS and emphasized avoidance of gas producing food and lactose. She is to call NP when she gets her new MD and an appt. Called and left message for Dr. Olen Pel to consider possibility of chronic pancreatitis and to order a lipase level and obtain stool for elastace. 06/17/21 Still waiting on an appt to be seen by other GI specialist. No "episode" since our last conversation. Reviewed with pt what she is writing in her journal and need to be very specific about this abdominal pain episodes to report to new MD> 07/06/21 Has seen new GI specialist and is scheduled for an endoscopy and colonoscopy on 07/16/21. She is happy with the MD's approach and hopeful that the etilogy of her sxs will be discovered. NP to follow up after diagnostics completed.        Plan:  Follow-up: Patient agrees to Care Plan and Follow-up. Follow-up in 3 week(s)  Kayleen Memos C. Myrtie Neither, MSN, [facility] Gerontological Nurse Practitioner [facility] Care Management [phone]

## 2021-07-08 ENCOUNTER — Telehealth: Payer: Self-pay | Admitting: Internal Medicine

## 2021-07-08 NOTE — Telephone Encounter (Signed)
Patient called states she had a chest infection on 07/01/21 and Virtual visit Dr. Doyle Askew PCP started her on a C pack and was better no fever. Also has an inhaler and is fine. Wanted to inform the office since she is scheduled for a double on 07/16/21.

## 2021-07-08 NOTE — Telephone Encounter (Signed)
Called pt and advised to monitor herself for worsening symptoms such as fever/chills, productive cough or shortness of breath. Advised to remain well hydrated. Should any of these symptoms worsen or recur, advised pt to contact our office for possible need to reschedule procedure, as well as notify her PCP's office for further evaluation and treatment of her symptoms. Routing to Dr. Lorenso Courier as Juluis Rainier and for further advice as she feels necessary.

## 2021-07-15 ENCOUNTER — Other Ambulatory Visit: Payer: Self-pay | Admitting: *Deleted

## 2021-07-15 NOTE — Patient Outreach (Addendum)
[facility]) Care Management  Martell  07/15/2021 Late entry for home visit on 07/11/21   Suzanne Galloway [birth year] [id number]  Subjective:  Acute home visit for pt requesting evaluation for not feeling well and values on her pulse Ox were "out of whack" she has been sick. Last week with URI, treated with Z-pack, much better in that respect. Having another GI episode with upper L quadrant discomfort, described as "gnawing and pressure sensation." She says these episodes totally drain her energy, so much so that she just has to go to bed for 3-5 days. She feels like a dishrag. She has not had any fever, nausea, diarrhea or constipation. Her appetite is very poor during these times but she is able to stay well hydrated. She also reports wt loss. She says she is sick of this and wants to get to the bottom of the problem so she can have her quality of life back.       On a brighter side, she reports her mood is improving with her increased dose of her antidepressant.  Objective: BP (!) 150/70 (BP Location: Left Arm, Patient Position: Sitting, Cuff Size: Normal)   Pulse 80   Resp 18   Wt 125 lb (56.7 kg)   BMI 22.86 kg/m Pulse Ox 97%  Pt is very pale, sickly in appearance. Alert and oriented. Fearful. Heart rate regular with rate variance 80-110. Lungs are clear. Abdomen with bowel sounds present throughout. Mild tenderness in ULQ.  Encounter Medications:  Outpatient Encounter Medications as of 07/15/2021  Medication Sig Note   desvenlafaxine (PRISTIQ) 50 MG 24 hr tablet Take 50 mg by mouth daily. 07/15/2021: Dose now increased to 100 mg daily, per Dr. Olen Pel.   Ascorbic Acid (VITAMIN C PO) Take 1 tablet by mouth daily.    aspirin 81 MG chewable tablet Chew 81 mg by mouth in the morning and at bedtime.     Calcium Carbonate-Vitamin D 600-400 MG-UNIT tablet Take 1 tablet by mouth daily.    famotidine (PEPCID) 40 MG tablet Take 40 mg by mouth 2 (two) times daily.      lacosamide ([name]) 50 MG TABS tablet TAKE ONE TABLET BY MOUTH TWICE DAILY    levothyroxine (SYNTHROID) 100 MCG tablet Take 100 mcg by mouth daily before breakfast.    loperamide (IMODIUM A-D) 2 MG tablet Take 2 mg by mouth 4 (four) times daily as needed for diarrhea or loose stools.    metoprolol succinate (TOPROL-XL) 25 MG 24 hr tablet Take 25 mg by mouth daily.    Multiple Vitamins-Minerals (PRESERVISION AREDS PO) Take 1 tablet by mouth daily.     nitroGLYCERIN (NITROSTAT) 0.4 MG SL tablet Place 0.4 mg under the tongue every 5 (five) minutes as needed for chest pain. 05/16/2020: Has not needed to take   pantoprazole (PROTONIX) 40 MG tablet Take 40 mg by mouth daily.    Polyethyl Glycol-Propyl Glycol (SYSTANE OP) Apply 1 drop to eye daily as needed.    sacubitril-valsartan (ENTRESTO) 24-26 MG Take 1 tablet by mouth 2 (two) times daily.     spironolactone (ALDACTONE) 25 MG tablet Take 25 mg by mouth daily.    sucralfate (CARAFATE) 1 g tablet Take 1 g by mouth 4 (four) times daily -  with meals and at bedtime.    zonisamide (ZONEGRAN) 100 MG capsule Take 2 capsules (200 mg total) by mouth at bedtime.    No facility-administered encounter medications on file as of 07/15/2021.  Fall/Depression Screening: Fall Risk  04/06/2020 08/10/2018 04/13/2018  Falls in the past year? 0 No No  Number falls in past yr: 0 - -  Injury with Fall? 0 - -   PHQ 2/9 Scores 09/11/2020 05/27/2020 05/23/2020 04/06/2020 06/11/2017  PHQ - 2 Score 0 1 1 2 2   PHQ- 9 Score - - - 4 5    Assessment: Malaise                        Resolving bronchitis                        Abdominal discomfort                        Depression - improving  Care Plan   Goals Addressed             This Visit's Progress    Will call MD, PA or NP to report any new health issues early to avoid complications and hospitalizations as evidenced by pt report/chart review through 07/04/21.       Long term goal High priority Started on 06/21/20,  renewed 12/03/20, renewed 04/03/21, renewed 07/11/21 Projected end date: 10/18/21 Barriers: Barriers: None  Will call MD, PA or NP providers to report any new health issues early to avoid complications and hospitalizations.   09/30/20 Suzanne Galloway is always very good to reach out and discuss medical and or emotional issues for resolution. 10/31/20 Pt is stable. She has seen her primary care provider and pharmacist during the last 30 days. One med change Effexor to pristiq as the first was not having an adequate effect. 12/03/20 Renewed this goal today as continued vigilance and early outreach to her medical provider with complications is paramount to avoid complications and reinforced to pt who agrees and promises she will do so. No hospitalization since last September. 12/31/20. Pt calls NP with questions or concerns and discussed issues freely and does follow through with recommendations. 02/03/21 Pt called to report being sick since Friday, nausea, vomiting, diarrhea, no fever, but starting to feel better today. She reports eating at church pot luck on Friday and she has a hx of IBS and a hiatal hernia. We reviewed her meds for her GI issues, diet. She uses lactose free dairy products and does eat fruits and vegetables. She does eat more gas producing food than she should. Agrees to reduce these. Providing pt education on IBS and Hiatal Hernia. 02/05/21 Encouraged pushing fluids as unable to eat anything solid. Go to urgent care if sxs cannot wait until appt on Monday. 02/26/21 Has not needed to go to urgent care or ED. Continues to have sxs IBS with loose stools. Takes her meds as ordered to control sxs.  No cardiac complaints and will see cardiologist today. Reinforced availability of NP for consultation. 03/12/21 Suzanne Galloway continues to have GI episodes, having one now, has to be in bed because she has no energy to get up. Her MDs are aware. Her cardiologist has told her she can stop her Brilinta June 1st and then get her  colonoscopy she had needed since last year but couldn't due her MI's. She is hopeful this may reveal what is the root of these incapacitating episodes. 04/03/21 Suzanne Galloway reports having had 11 good days in a row and then yesterday she had a relapse of her GI sxs. She is taking her meds and prns as  ordered. She should have her colonoscopy before we talk again in July. 05/01/21 Has not had any acute illness during the last month. Had abdominal X-Ray vs colonoscopy as follow up for her IBS. She has been instructed to "watch what she eats and take her carafate 4 times a day." She reports she has less sxs that she had been having. Reinforced early reports of issues and regular medical check ups. 06/03/21 Suzanne Galloway continues to have episodes of upper abdominal discomfort that takes all her strength away and puts her in the bed for several days. She is not confident in her GI provider and thinks that they are just casting her aside. Discussed the need for a second opinion. Pt to call for a provider that takes her insurance and is convenient to her. Reviewed diet recommended for IBS and emphasized avoidance of gas producing food and lactose. She is to call NP when she gets her new MD and an appt. Called and left message for Dr. Olen Pel to consider possibility of chronic pancreatitis and to order a lipase level and obtain stool for elastace. 06/17/21 Still waiting on an appt to be seen by other GI specialist. No "episode" since our last conversation. Reviewed with pt what she is writing in her journal and need to be very specific about this abdominal pain episodes to report to new MD> 07/06/21 Has seen new GI specialist and is scheduled for an endoscopy and colonoscopy on 07/16/21. She is happy with the MD's approach and hopeful that the etilogy of her sxs will be discovered. NP to follow up after diagnostics completed. 07/11/21 Pt called and requested home visit for check up, not feeling well, pulse ox readings are abnormal per pt  report, having another GI episode.  Bronchitis is resolving. See objective findings above. No acute findings. Pt is scheduled for colonoscopy and endoscopy 07/16/21. Praised for asking for assessment when in doubt about her health status. NP to follow up after diagnostics. Asked pt to chart on a calendar her GI episodes for Dr.         Kathyrn Lass: Report findings to Dr. Lorenso Courier. Follow-up: Patient agrees to Care Plan and Follow-up. Follow-up in 2 week(s)  Kayleen Memos C. Myrtie Neither, MSN, [facility] Gerontological Nurse Practitioner [facility] Care Management [phone]

## 2021-07-16 ENCOUNTER — Encounter: Payer: Self-pay | Admitting: Internal Medicine

## 2021-07-16 ENCOUNTER — Other Ambulatory Visit: Payer: Self-pay

## 2021-07-16 ENCOUNTER — Ambulatory Visit (AMBULATORY_SURGERY_CENTER): Payer: Medicare Other | Admitting: Internal Medicine

## 2021-07-16 VITALS — BP 118/54 | HR 72 | Temp 98.6°F | Resp 11 | Ht 62.0 in | Wt 130.0 lb

## 2021-07-16 DIAGNOSIS — D125 Benign neoplasm of sigmoid colon: Secondary | ICD-10-CM

## 2021-07-16 DIAGNOSIS — R109 Unspecified abdominal pain: Secondary | ICD-10-CM

## 2021-07-16 DIAGNOSIS — K222 Esophageal obstruction: Secondary | ICD-10-CM

## 2021-07-16 DIAGNOSIS — K449 Diaphragmatic hernia without obstruction or gangrene: Secondary | ICD-10-CM

## 2021-07-16 DIAGNOSIS — K319 Disease of stomach and duodenum, unspecified: Secondary | ICD-10-CM | POA: Diagnosis not present

## 2021-07-16 DIAGNOSIS — D122 Benign neoplasm of ascending colon: Secondary | ICD-10-CM

## 2021-07-16 DIAGNOSIS — Z1211 Encounter for screening for malignant neoplasm of colon: Secondary | ICD-10-CM

## 2021-07-16 DIAGNOSIS — R1010 Upper abdominal pain, unspecified: Secondary | ICD-10-CM

## 2021-07-16 DIAGNOSIS — R1310 Dysphagia, unspecified: Secondary | ICD-10-CM | POA: Diagnosis not present

## 2021-07-16 DIAGNOSIS — D120 Benign neoplasm of cecum: Secondary | ICD-10-CM | POA: Diagnosis not present

## 2021-07-16 MED ORDER — SODIUM CHLORIDE 0.9 % IV SOLN: 500.0000 mL | Freq: Once | INTRAVENOUS | Status: DC

## 2021-07-16 MED ORDER — PANTOPRAZOLE SODIUM 40 MG PO TBEC: 40.0000 mg | DELAYED_RELEASE_TABLET | Freq: Two times a day (BID) | ORAL | 1 refills | Status: DC

## 2021-07-16 NOTE — Progress Notes (Signed)
[facility]    GASTROENTEROLOGY PROCEDURE H&P NOTE   Primary Care Physician: Orpah Melter, MD    Reason for Procedure:   Abdominal pain, constipation, dysphagia, colon cancer screening  Plan:    EGD/colonoscopy  Patient is appropriate for endoscopic procedure(s) in the ambulatory ([facility]) setting.  The nature of the procedure, as well as the risks, benefits, and alternatives were carefully and thoroughly reviewed with the patient. Ample time for discussion and questions allowed. The patient understood, was satisfied, and agreed to proceed.     HPI: Suzanne Galloway is a 80 y.o. female who presents for EGD/colonoscopy for evaluation of Abdominal pain, constipation, dysphagia, colon cancer screening .  Patient was most recently seen in the Gastroenterology Clinic on 06/27/21.  No interval change in medical history since that appointment. Please refer to that note for full details regarding GI history and clinical presentation.   Past Medical History:  Diagnosis Date   Anxiety    Arthritis    "hands, back, toes" (01/01/2015)   Asthma    Chronic bronchitis ([place])    "get it just about q yr" (01/01/2015)   Coronary artery disease    S/P STEMI 03/10/20, DES LAD, VFib Arrest   Depression    GERD (gastroesophageal reflux disease)    Hearing loss    hearing aids   Heart attack ([place])    x 2 in one day   High cholesterol    Hyperlipemia    Hypertension    Hypothyroidism    IBS (irritable bowel syndrome)    Mitral valve prolapse    Seizures ([place])    dx Aug 2018, most recent seizure 10/13/17, partial complex   Urinary incontinence     Past Surgical History:  Procedure Laterality Date   [place] RELEASE Bilateral 2008   CYSTECTOMY Right 09/2014   "hand"    DILATION AND CURETTAGE OF UTERUS     FINGER SURGERY Bilateral    "scraped arthritis out of thumbs"   INCONTINENCE SURGERY     LAPAROSCOPIC CHOLECYSTECTOMY     LUMBAR LAMINECTOMY  1987;  12/2009   ; [facility] End[facility] 01/16/2010   TOE SURGERY Left 09/21/2005   "took bone out of 2nd toe; took cyst out"   [place]    Prior to Admission medications   Medication Sig Start Date End Date Taking? Authorizing Provider  Ascorbic Acid (VITAMIN C PO) Take 1 tablet by mouth daily.   Yes [provider]  aspirin 81 MG chewable tablet Chew 81 mg by mouth in the morning and at bedtime.    Yes [provider]  Calcium Carbonate-Vitamin D 600-400 MG-UNIT tablet Take 1 tablet by mouth daily.   Yes [provider]  desvenlafaxine (PRISTIQ) 50 MG 24 hr tablet Take 50 mg by mouth daily. 07/01/20  Yes [provider]  lacosamide ([name]) 50 MG TABS tablet TAKE ONE TABLET BY MOUTH TWICE DAILY 06/02/21  Yes Penumalli, Earlean Polka, MD  levothyroxine (SYNTHROID) 100 MCG tablet Take 100 mcg by mouth daily before breakfast.   Yes [provider]  loperamide (IMODIUM A-D) 2 MG tablet Take 2 mg by mouth 4 (four) times daily as needed for diarrhea or loose stools.   Yes [provider]  metoprolol succinate (TOPROL-XL) 25 MG 24 hr tablet Take 25 mg by mouth daily.   Yes [provider]  Multiple Vitamins-Minerals (PRESERVISION AREDS  PO) Take 1 tablet by mouth daily.    Yes [provider]  pantoprazole (PROTONIX) 40 MG tablet Take 40 mg by mouth daily.   Yes [provider]  Polyethyl Glycol-Propyl Glycol (SYSTANE OP) Apply 1 drop to eye daily as needed.   Yes [provider]  sacubitril-valsartan (ENTRESTO) 24-26 MG Take 1 tablet by mouth 2 (two) times daily.    Yes [provider]  spironolactone (ALDACTONE) 25 MG tablet Take 25 mg by mouth daily.   Yes [provider]  sucralfate (CARAFATE) 1 g tablet Take 1 g by mouth 4 (four) times daily -  with meals and at bedtime.   Yes [provider]  zonisamide (ZONEGRAN) 100 MG capsule Take 2 capsules (200 mg total) by mouth at  bedtime. 11/11/20  Yes Lomax, [name], NP  albuterol (VENTOLIN HFA) 108 (90 Base) MCG/ACT inhaler Inhale 1 puff into the lungs every 4 (four) hours. 07/01/21   [provider]  famotidine (PEPCID) 40 MG tablet Take 40 mg by mouth 2 (two) times daily.  Patient not taking: Reported on 07/16/2021    [provider]  nitroGLYCERIN (NITROSTAT) 0.4 MG SL tablet Place 0.4 mg under the tongue every 5 (five) minutes as needed for chest pain. Patient not taking: Reported on 07/16/2021    [provider]    Current Outpatient Medications  Medication Sig Dispense Refill   Ascorbic Acid (VITAMIN C PO) Take 1 tablet by mouth daily.     aspirin 81 MG chewable tablet Chew 81 mg by mouth in the morning and at bedtime.      Calcium Carbonate-Vitamin D 600-400 MG-UNIT tablet Take 1 tablet by mouth daily.     desvenlafaxine (PRISTIQ) 50 MG 24 hr tablet Take 50 mg by mouth daily.     lacosamide ([name]) 50 MG TABS tablet TAKE ONE TABLET BY MOUTH TWICE DAILY 180 tablet 1   levothyroxine (SYNTHROID) 100 MCG tablet Take 100 mcg by mouth daily before breakfast.     loperamide (IMODIUM A-D) 2 MG tablet Take 2 mg by mouth 4 (four) times daily as needed for diarrhea or loose stools.     metoprolol succinate (TOPROL-XL) 25 MG 24 hr tablet Take 25 mg by mouth daily.     Multiple Vitamins-Minerals (PRESERVISION AREDS PO) Take 1 tablet by mouth daily.      pantoprazole (PROTONIX) 40 MG tablet Take 40 mg by mouth daily.     Polyethyl Glycol-Propyl Glycol (SYSTANE OP) Apply 1 drop to eye daily as needed.     sacubitril-valsartan (ENTRESTO) 24-26 MG Take 1 tablet by mouth 2 (two) times daily.      spironolactone (ALDACTONE) 25 MG tablet Take 25 mg by mouth daily.     sucralfate (CARAFATE) 1 g tablet Take 1 g by mouth 4 (four) times daily -  with meals and at bedtime.     zonisamide (ZONEGRAN) 100 MG capsule Take 2 capsules (200 mg total) by mouth at bedtime. 180 capsule 4   albuterol (VENTOLIN HFA) 108 (90  Base) MCG/ACT inhaler Inhale 1 puff into the lungs every 4 (four) hours.     famotidine (PEPCID) 40 MG tablet Take 40 mg by mouth 2 (two) times daily.  (Patient not taking: Reported on 07/16/2021)     nitroGLYCERIN (NITROSTAT) 0.4 MG SL tablet Place 0.4 mg under the tongue every 5 (five) minutes as needed for chest pain. (Patient not taking: Reported on 07/16/2021)     Current Facility-Administered Medications  Medication  Dose Route Frequency Provider Last Rate Last Admin   0.9 %  sodium chloride infusion  500 mL Intravenous Once Sharyn Creamer, MD        Allergies as of 07/16/2021 - Review Complete 07/16/2021  Allergen Reaction Noted   Codeine Other (See Comments) 10/26/2015   Lamictal [lamotrigine] Other (See Comments) 06/11/2017   Oxcarbazepine Other (See Comments) 06/11/2017   Symbicort [budesonide-formoterol fumarate] Other (See Comments) 11/18/2018   Atorvastatin  01/28/2018   Buspirone  01/28/2018   Carbamazepine Other (See Comments) 09/23/2018   Depakote [divalproex sodium] Nausea And Vomiting 04/11/2018   Dilaudid [hydromorphone] Other (See Comments) 08/05/2015   Dulera [mometasone furo-formoterol fum] Other (See Comments)    Lactose intolerance (gi) Diarrhea 01/24/2015   Morphine and related Nausea And Vomiting 09/01/2012   Percocet [oxycodone-acetaminophen] Other (See Comments) 01/01/2015   Pravastatin Other (See Comments) 08/05/2015   Vesicare [solifenacin] Other (See Comments) 06/16/2013   Zonisamide Other (See Comments) 09/23/2018    Family History  Problem Relation Age of Onset   COPD Father        smoked   Heart disease Father    Esophageal cancer Father        smoked   Transient ischemic attack Father    Cancer Father        kidney   Deep vein thrombosis Brother    Parkinson's disease Mother    Heart failure Mother    Diabetes Mother    Cancer Daughter    Diabetes Maternal Grandmother    Diabetes Paternal Grandfather     Social History   Socioeconomic  History   Marital status: Widowed    Spouse name: Not on file   Number of children: 3   Years of education: 7   Highest education level: GED or equivalent  Occupational History   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  Tobacco Use   Smoking status: Never   Smokeless tobacco: Never  Vaping Use   Vaping Use: Never used  Substance and Sexual Activity   Alcohol use: Yes    Comment: rarely   Drug use: No   Sexual activity: Not Currently  Other Topics Concern   Not on file  Social History Narrative   10/03/19 Lives with dgtr, [name] and son-in-law    Retired, widowed   Education- high school   Caffeine 1 cup daily   Social Determinants of Health   Financial Resource Strain: Not on file  Food Insecurity: No Food Insecurity   Worried About Charity fundraiser in the Last Year: Never true   Arboriculturist in the Last Year: Never true  Transportation Needs: No Transportation Needs   Lack of Transportation (Medical): No   Lack of Transportation (Non-Medical): No  Physical Activity: Not on file  Stress: Not on file  Social Connections: Not on file  Intimate Partner Violence: Not on file    Physical Exam: Vital signs in last 24 hours: BP 134/64   Pulse 95   Temp 98.6 F (37 C)   Ht 5\' 2"  (1.575 m)   Wt 130 lb (59 kg)   SpO2 96%   BMI 23.78 kg/m  GEN: NAD EYE: Sclerae anicteric ENT: MMM CV: Non-tachycardic Pulm: CTA b/l GI: Soft NEURO:  Alert & Oriented   Christia Reading, MD [facility]   07/16/2021 2:33 PM

## 2021-07-16 NOTE — Op Note (Signed)
[facility] Patient Name: Suzanne Galloway Procedure Date: 07/16/2021 2:32 PM MRN: [id number] Endoscopist: Sonny Masters "Suzanne Galloway ,  Age: 80 Referring MD:  Date of Birth: [birth year] Gender: Female Account #: [id number] Procedure:                Upper GI endoscopy Indications:              Epigastric abdominal pain, Dysphagia Medicines:                Monitored Anesthesia Care Procedure:                Pre-Anesthesia Assessment:                           - Prior to the procedure, a History and Physical                            was performed, and patient medications and                            allergies were reviewed. The patient's tolerance of                            previous anesthesia was also reviewed. The risks                            and benefits of the procedure and the sedation                            options and risks were discussed with the patient.                            All questions were answered, and informed consent                            was obtained. Prior Anticoagulants: The patient has                            taken no previous anticoagulant or antiplatelet                            agents. ASA Grade Assessment: III - A patient with                            severe systemic disease. After reviewing the risks                            and benefits, the patient was deemed in                            satisfactory condition to undergo the procedure.                           After obtaining informed consent, the endoscope was  passed under direct vision. Throughout the                            procedure, the patient's blood pressure, pulse, and                            oxygen saturations were monitored continuously. The                            Endoscope was introduced through the mouth, and                            advanced to the second part of duodenum. The upper                            GI endoscopy was  accomplished without difficulty.                            The patient tolerated the procedure well. Scope In: Scope Out: Findings:                 White nummular lesions were noted in the upper                            third of the esophagus. Biopsies were taken with a                            cold forceps for histology.                           A non-obstructing Schatzki ring was found at the                            gastroesophageal junction. This was disrupted with                            cold forceps.                           A large hiatal hernia was present.                           Diffuse erythematous mucosa without bleeding was                            found in the gastric antrum. Biopsies were taken                            with a cold forceps for Helicobacter pylori testing.                           The examined duodenum was normal. Complications:            No immediate complications. Estimated Blood Loss:     Estimated blood loss was minimal. Impression:               -  White nummular lesions in esophageal mucosa.                            Biopsied.                           - Non-obstructing Schatzki ring.                           - Large hiatal hernia.                           - Erythematous mucosa in the antrum. Biopsied.                           - Normal examined duodenum. Recommendation:           - Await pathology results.                           - Perform a colonoscopy today. Sonny Masters "Suzanne Galloway,  07/16/2021 4:10:25 PM

## 2021-07-16 NOTE — Op Note (Signed)
[facility] Patient Name: Suzanne Galloway Procedure Date: 07/16/2021 2:31 PM MRN: [id number] Endoscopist: Sonny Masters "Suzanne Galloway ,  Age: 80 Referring MD:  Date of Birth: 06/22/1941 Gender: Female Account #: [id number] Procedure:                Colonoscopy Indications:              Screening for colorectal malignant neoplasm Medicines:                Monitored Anesthesia Care Procedure:                Pre-Anesthesia Assessment:                           - Prior to the procedure, a History and Physical                            was performed, and patient medications and                            allergies were reviewed. The patient's tolerance of                            previous anesthesia was also reviewed. The risks                            and benefits of the procedure and the sedation                            options and risks were discussed with the patient.                            All questions were answered, and informed consent                            was obtained. Prior Anticoagulants: The patient has                            taken no previous anticoagulant or antiplatelet                            agents. ASA Grade Assessment: III - A patient with                            severe systemic disease. After reviewing the risks                            and benefits, the patient was deemed in                            satisfactory condition to undergo the procedure.                           After obtaining informed consent, the colonoscope  was passed under direct vision. Throughout the                            procedure, the patient's blood pressure, pulse, and                            oxygen saturations were monitored continuously. The                            CF [id number] #[id number] was introduced through the anus                            and advanced to the the terminal ileum. The                            colonoscopy was  performed without difficulty. The                            patient tolerated the procedure well. The quality                            of the bowel preparation was adequate. Scope In: 2:54:10 PM Scope Out: 3:59:01 PM Scope Withdrawal Time: 0 hours 56 minutes 48 seconds  Total Procedure Duration: 1 hour 4 minutes 51 seconds  Findings:                 The terminal ileum appeared normal.                           A 8 mm polyp was found in the cecum. The polyp was                            multi-lobulated. The polyp was removed with a cold                            snare. Resection and retrieval were complete.                           A 16 mm polyp was found in the ascending colon. The                            polyp was multi-lobulated. Preparations were made                            for mucosal resection. Saline was injected to raise                            the lesion. Snare mucosal resection was performed.                            The polyp had to be removed piecemeal due to its  positioning and shape. Resection and retrieval were                            complete. To prevent bleeding post-intervention,                            two hemostatic clips were successfully placed.                            There was no bleeding at the end of the procedure.                           A 3 mm polyp was found in the descending colon. The                            polyp was sessile. The polyp was removed with a                            cold snare. Resection and retrieval were complete.                           Non-bleeding internal hemorrhoids were found during                            retroflexion. The hemorrhoids were small. Complications:            No immediate complications. Estimated Blood Loss:     Estimated blood loss was minimal. Impression:               - The examined portion of the ileum was normal.                           - One 8 mm  polyp in the cecum, removed with a cold                            snare. Resected and retrieved.                           - One 16 mm polyp in the ascending colon, removed                            with mucosal resection piecemeal. Resected and                            retrieved. Clips were placed.                           - One 3 mm polyp in the descending colon, removed                            with a cold snare. Resected and retrieved.                           - Non-bleeding internal  hemorrhoids.                           - Mucosal resection was performed. Resection and                            retrieval were complete. Recommendation:           - Discharge patient to home (with escort).                           - Start Miralax daily to see if helping with                            constipation will reduce the likelihood of these                            episodes of abdominal pain.                           - Await pathology results.                           - The findings and recommendations were discussed                            with the patient.                           - Return to GI clinic as previously scheduled.                            During this visit, we can discuss whether or not it                            would be worthwhile to undergo another colonoscopy                            in 6 months to assess the polypectomy site. Sonny Masters "Suzanne Galloway,  07/16/2021 4:20:21 PM

## 2021-07-16 NOTE — Progress Notes (Signed)
Spoke to the patient after her procedure. She would like to proceed with a repeat colonoscopy in 6-12 months for evaluation of the ascending colon polypectomy site. Also asked her to increase her PPI from QD to BID and start daily Miralax to aim for having one good BM per day. If at her next clinic visit in 08/2021, her ab pain is not better, then would consider getting a fecal elastase and ordering a CTA A/P.

## 2021-07-16 NOTE — Progress Notes (Signed)
Report to PACU, RN, vss, BBS= Clear.  

## 2021-07-16 NOTE — Progress Notes (Signed)
1537 notified DR D pt was at an hour under sedtion.  She acknowledged.

## 2021-07-16 NOTE — Patient Instructions (Addendum)
YOU HAD AN ENDOSCOPIC PROCEDURE TODAY AT [facility]:   Refer to the procedure report that was given to you for any specific questions about what was found during the examination.  If the procedure report does not answer your questions, please call your gastroenterologist to clarify.  If you requested that your care partner not be given the details of your procedure findings, then the procedure report has been included in a sealed envelope for you to review at your convenience later.  YOU SHOULD EXPECT: Some feelings of bloating in the abdomen. Passage of more gas than usual.  Walking can help get rid of the air that was put into your GI tract during the procedure and reduce the bloating. If you had a lower endoscopy (such as a colonoscopy or flexible sigmoidoscopy) you may notice spotting of blood in your stool or on the toilet paper. If you underwent a bowel prep for your procedure, you may not have a normal bowel movement for a few days.  Please Note:  You might notice some irritation and congestion in your nose or some drainage.  This is from the oxygen used during your procedure.  There is no need for concern and it should clear up in a day or so.  SYMPTOMS TO REPORT IMMEDIATELY:  Following lower endoscopy (colonoscopy or flexible sigmoidoscopy):  Excessive amounts of blood in the stool  Significant tenderness or worsening of abdominal pains  Swelling of the abdomen that is new, acute  Fever of 100F or higher  Following upper endoscopy (EGD)  Vomiting of blood or coffee ground material  New chest pain or pain under the shoulder blades  Painful or persistently difficult swallowing  New shortness of breath  Fever of 100F or higher  Black, tarry-looking stools  For urgent or emergent issues, a gastroenterologist can be reached at any hour by calling [phone]. Do not use MyChart messaging for urgent concerns.    DIET:  We do recommend a small meal at first, but  then you may proceed to your regular diet.  Drink plenty of fluids but you should avoid alcoholic beverages for 24 hours.  ACTIVITY:  You should plan to take it easy for the rest of today and you should NOT DRIVE or use heavy machinery until tomorrow (because of the sedation medicines used during the test).    FOLLOW UP: Our staff will call the number listed on your records 48-72 hours following your procedure to check on you and address any questions or concerns that you may have regarding the information given to you following your procedure. If we do not reach you, we will leave a message.  We will attempt to reach you two times.  During this call, we will ask if you have developed any symptoms of COVID 19. If you develop any symptoms (ie: fever, flu-like symptoms, shortness of breath, cough etc.) before then, please call [phone].  If you test positive for Covid 19 in the 2 weeks post procedure, please call and report this information to [place].    If any biopsies were taken you will be contacted by phone or by letter within the next 1-3 weeks.  Please call us at [phone] if you have not heard about the biopsies in 3 weeks.    SIGNATURES/CONFIDENTIALITY: You and/or your care partner have signed paperwork which will be entered into your electronic medical record.  These signatures attest to the fact that that the information above on your After  Visit Summary has been reviewed and is understood.  Full responsibility of the confidentiality of this discharge information lies with you and/or your care-partner.    Resume medications. Information given on polyps,hemorrhoids and hiatal hernia.recommend miralax daily.

## 2021-07-16 NOTE — Progress Notes (Signed)
Called to room to assist during endoscopic procedure.  Patient ID and intended procedure confirmed with present staff. Received instructions for my participation in the procedure from the performing physician.  

## 2021-07-17 ENCOUNTER — Other Ambulatory Visit: Payer: Self-pay

## 2021-07-17 DIAGNOSIS — R109 Unspecified abdominal pain: Secondary | ICD-10-CM

## 2021-07-17 MED ORDER — POLYETHYLENE GLYCOL 3350 17 GM/SCOOP PO POWD: ORAL | 3 refills | Status: AC

## 2021-07-18 ENCOUNTER — Telehealth: Payer: Self-pay

## 2021-07-18 NOTE — Telephone Encounter (Signed)
  Follow up Call-  Call back number 07/16/2021  Post procedure Call Back phone  # [phone]  Permission to leave phone message Yes  Some recent data might be hidden     Patient questions:  Do you have a fever, pain , or abdominal swelling? No. Pain Score  0 *  Have you tolerated food without any problems? Yes.    Have you been able to return to your normal activities? Yes.    Do you have any questions about your discharge instructions: Diet   No. Medications  No. Follow up visit  No.  Do you have questions or concerns about your Care? No.  Actions: * If pain score is 4 or above: No action needed, pain <4.

## 2021-07-22 ENCOUNTER — Ambulatory Visit: Payer: Medicare Other | Attending: Internal Medicine

## 2021-07-22 DIAGNOSIS — E039 Hypothyroidism, unspecified: Secondary | ICD-10-CM

## 2021-07-22 DIAGNOSIS — E7800 Pure hypercholesterolemia, unspecified: Secondary | ICD-10-CM | POA: Insufficient documentation

## 2021-07-22 LAB — LIPID PANEL
Cholesterol: 186 mg/dL (ref <200)
HDL Cholesterol: 62 mg/dL (ref >40)
LDL Cholesterol Calculation: 109 mg/dL — ABNORMAL HIGH (ref <100)
Non-HDL Cholesterol: 124 mg/dL (ref <=150)
Total Cholesterol: HDL Ratio: 3 (ref <4.0)
Triglyceride: 73 mg/dL (ref <150)

## 2021-07-22 LAB — CBC WITH DIFFERENTIAL
Basophils % Auto: 0.9 %
Basophils Abs Auto: 0.1 10*3/uL (ref 0.0–0.2)
Eosinophils % Auto: 3 %
Eosinophils Abs Auto: 0.2 10*3/uL (ref 0.0–0.5)
Hematocrit: 40.9 % (ref 36.0–46.0)
Hemoglobin: 13.6 g/dL (ref 12.0–16.0)
Lymphocytes % Auto: 28.6 %
Lymphocytes Abs Auto: 1.6 10*3/uL (ref 1.0–4.8)
MCH: 30.3 pg (ref 27.0–33.0)
MCHC: 33.4 % (ref 32.0–36.0)
MCV: 90.8 fL (ref 80.0–100.0)
MPV: 8.2 fL (ref 6.8–10.0)
Monocytes % Auto: 8.7 %
Monocytes Abs Auto: 0.5 10*3/uL (ref 0.1–0.8)
Neutrophils % Auto: 58.8 %
Neutrophils Abs Auto: 3.4 10*3/uL (ref 1.8–7.7)
Platelet Count: 262 10*3/uL (ref 130–400)
RDW: 14.1 % (ref 0.0–14.7)
Red Blood Cell Count: 4.5 10*6/uL (ref 4.00–5.20)
White Blood Cell Count: 5.7 10*3/uL (ref 4.5–11.0)

## 2021-07-22 LAB — COMPREHENSIVE METABOLIC PANEL
Alanine Transferase (ALT): 12 U/L (ref <=33)
Albumin: 3.9 g/dL — ABNORMAL LOW (ref 4.0–4.9)
Alkaline Phosphatase (ALP): 67 U/L (ref 35–129)
Aspartate Transaminase (AST): 21 U/L (ref <=41)
Bilirubin Total: 0.7 mg/dL (ref <=1.2)
Calcium: 8.6 mg/dL (ref 8.6–10.0)
Carbon Dioxide Total: 25 mmol/L (ref 22–29)
Chloride: 109 mmol/L — ABNORMAL HIGH (ref 98–107)
Creatinine Serum: 0.75 mg/dL (ref 0.51–1.17)
E-GFR Creatinine (Female): 76 mL/min/{1.73_m2}
Glucose: 94 mg/dL (ref 74–109)
Potassium: 3.9 mmol/L (ref 3.4–5.1)
Protein: 6.1 g/dL — ABNORMAL LOW (ref 6.6–8.7)
Sodium: 146 mmol/L — ABNORMAL HIGH (ref 136–145)
Urea Nitrogen, Blood (BUN): 12 mg/dL (ref 6–20)

## 2021-07-22 LAB — THYROID STIMULATING HORMONE: Thyroid Stimulating Hormone: 0.85 u[IU]/mL (ref 0.27–4.20)

## 2021-07-23 ENCOUNTER — Other Ambulatory Visit: Payer: Self-pay | Admitting: *Deleted

## 2021-07-23 DIAGNOSIS — E039 Hypothyroidism, unspecified: Secondary | ICD-10-CM | POA: Diagnosis not present

## 2021-07-23 NOTE — Patient Outreach (Signed)
[facility]) Care Management  07/23/2021  WYATT GALVAN 1941-10-11 [id number]  Telephone outreach for follow up on pt's GI studies and results.  Mrs. Rule reports Dr. Lorenso Courier talked to her after her procedures and advised her of the findings. She has not heard back about the results of the pathology yet. She is anxious to get that information. She has an appt with Dr. Lorenso Courier in November but will be called when the pathology report comes back.  Darra says she feels good since the tests and is taking a bulk forming fiber product. She has had 2 formed stools today.   We will talk again when she gets her report.  [name]. Myrtie Neither, MSN, [facility] Gerontological Nurse Practitioner [facility] Care Management [phone]

## 2021-07-24 ENCOUNTER — Encounter: Payer: Self-pay | Admitting: Internal Medicine

## 2021-07-29 ENCOUNTER — Ambulatory Visit: Payer: Medicare Other | Admitting: Internal Medicine

## 2021-07-29 VITALS — BP 126/70 | HR 60 | Temp 98.0°F | Resp 16 | Ht 65.0 in | Wt 165.7 lb

## 2021-07-29 DIAGNOSIS — Z23 Encounter for immunization: Secondary | ICD-10-CM

## 2021-07-29 DIAGNOSIS — E785 Hyperlipidemia, unspecified: Secondary | ICD-10-CM

## 2021-07-29 DIAGNOSIS — E039 Hypothyroidism, unspecified: Secondary | ICD-10-CM

## 2021-07-29 DIAGNOSIS — E870 Hyperosmolality and hypernatremia: Secondary | ICD-10-CM

## 2021-07-29 MED ORDER — CLOPIDOGREL 75 MG TABLET
75.0000 mg | ORAL_TABLET | Freq: Every day | ORAL | 3 refills | Status: DC
Start: 2021-07-29 — End: 2022-08-11

## 2021-07-29 MED ORDER — ATORVASTATIN 40 MG TABLET
40.0000 mg | ORAL_TABLET | Freq: Every day | ORAL | 3 refills | Status: DC
Start: 2021-07-29 — End: 2022-08-03

## 2021-07-29 MED ORDER — LEVOTHYROXINE 112 MCG TABLET
ORAL_TABLET | ORAL | 3 refills | Status: DC
Start: 2021-07-29 — End: 2022-09-14

## 2021-07-29 NOTE — Nursing Note (Signed)
Patient has been identified by first, last name and date of birth.     Since you last saw your [facility] provider, have you been seen by any other physicians or surgeons, gone to the emergency room/urgent care or been hospitalized?  no      Have you started any over-the-counter medications, herbal supplements, vitamins or home remedies?  no.name       Tammy [name]  is here for f/unit(s)     Patient is accompanied by self     Patient WAS wearing a surgical mask  Contact and Droplet precautions were followed when caring for the patient.   PPE used by provider during encounter: Surgical mask       The patient has received a flu vaccine previously: yes    The Inactivated Influenza Vaccine VIS document for the flu injection was given to patient to review. All questions were referred to the physician.    The Screening Checklist for Contraindications to Inactivated Injectable Influenza Vaccination was completed:  Is the patient sick today?  Does the patient have an allergy to a component of the vaccine?  Has the patient ever had a serious reaction to the influenza vaccine in the past?  Has the patient ever had Guillain-Barre syndrome?  Did the patient answer "Yes" to any of the screening questions? No - The Influenza Vaccine was administered per Policy 2041 using the standing order.       [employer], [place]

## 2021-07-29 NOTE — Progress Notes (Signed)
Chief Complaint: Patient presents today with   Follow-up on  cholesterol    Patient WAS wearing a surgical mask   Contact and Droplet precautions were followed when caring for the patient.   PPE used by provider during encounter: Surgical mask,face shield and gloves used     [name] If you are reviewing this progress note and have questions about the meaning or medical terms being used, please schedule an appointment or bring it up at your next follow-up appointment. Medical notes are meant to be a communication tool between medical professionals and require medical terms to be used for efficiency.      - Voice recognition software may have been used in portions of this report.   Inadvertent computerized transcription errors may be present despite efforts.    Portions of this note were dictated using [employer]. Omissions, "sound-alikes" and sentence fragmentation are unintentional.      Hyperlipidemia,    management discussed with patient, shehas  been working on  diet, she has  been doing regular exercise. Patient verbalised understanding about the significance for regular exercise and low fat diet in management of this problem  , she has been doing yard work, eating a lot of vegetables, hamburgers she has been drinking Crystal light peach tea every day  Lab Results   Lab Name Value Date/Time    CHOL 186 07/22/2021 07:39 AM    CHOL 188 05/09/2014 09:44 AM    LDLC 109 (H) 07/22/2021 07:39 AM    LDLC 107 05/09/2014 09:44 AM    HDL 62 07/22/2021 07:39 AM    HDL 59 05/09/2014 09:44 AM    TRIG 73 07/22/2021 07:39 AM    TRIG 108 05/09/2014 09:44 AM     Labs reviewed sodium is high  Lab Results   Lab Name Value Date/Time    NA 146 (H) 07/22/2021 07:39 AM    NA 139 05/09/2014 09:44 AM    K 3.9 07/22/2021 07:39 AM    K 3.9 05/09/2014 09:44 AM    CL 109 (H) 07/22/2021 07:39 AM    CL 106 05/09/2014 09:44 AM    CO2 25 07/22/2021 07:39 AM    CO2 27 05/09/2014 09:44 AM    BUN 12 07/22/2021 07:39 AM    BUN 15 05/09/2014  09:44 AM    CR 0.75 07/22/2021 07:39 AM    CR 0.69 05/09/2014 09:44 AM    GLU 94 07/22/2021 07:39 AM    GLU 96 05/09/2014 09:44 AM       No symptoms of hypo or hyperthyroidism: no decreased or increased weight, no feeling cold/chilly or excessively warm, no diarrhea or constipation, no undue sweatiness, anxiety or palpitations.   Lab Results   Lab Name Value Date/Time    TSH 0.85 07/22/2021 07:39 AM    TSH 1.89 05/09/2014 09:44 AM    FT4 1.27 06/17/2009 08:16 AM          Ros  complete review of symptons was done all other systems are negative except as otherwise stated in HPI      Vs  BP 126/70 (SITE: right arm, Orthostatic Position: sitting, Cuff Size: regular)   Pulse 60   Temp 36.7 C (98 F) (Temporal)   Resp 16   Ht 1.651 m (5\' 5" )   Wt 75.2 kg (165 lb 11.2 oz)   SpO2 97%   BMI 27.57 kg/m     Physical exam -  General Appearance: healthy, alert, no distress, pleasant  affect, cooperative.  Eyes:  conjunctivae and corneas clear. PERRL, EOM's intact. sclerae normal.  Ears:  external inspection of ears show no abnormality.  Lungs: no chest deformities noted .lungs clear to auscultation.  Heart:  normal rate and regular rhythm, no murmurs,  Extremities:  no cyanosis, clubbing, or edema.    ASSESMENT AND PLAN       (E78.5) Dyslipidemia  Comment:   Plan: Atorvastatin (LIPITOR) 40 mg tablet              (E03.9) Acquired hypothyroidism  Comment:   Plan: On treatment              (E87.0) Serum sodium elevated  Comment:   Plan: Sodium        Advised him to stop Crystal light peach tea     I did review patient's past medical and family/social history, no changes noted.  Barriers to Learning assessed: none. Patient verbalizes understanding of teaching and instructions.  Zettie Cooley MD      Electronically Signed By:  Zettie Cooley, MD  Physician associate   [facility] [place]  [phone]

## 2021-07-29 NOTE — Patient Instructions (Signed)
Repeat blood test in 1 month.

## 2021-07-30 DIAGNOSIS — I10 Essential (primary) hypertension: Secondary | ICD-10-CM | POA: Diagnosis not present

## 2021-07-30 DIAGNOSIS — I255 Ischemic cardiomyopathy: Secondary | ICD-10-CM | POA: Diagnosis not present

## 2021-07-30 DIAGNOSIS — I2510 Atherosclerotic heart disease of native coronary artery without angina pectoris: Secondary | ICD-10-CM | POA: Diagnosis not present

## 2021-08-04 DIAGNOSIS — B349 Viral infection, unspecified: Secondary | ICD-10-CM | POA: Diagnosis not present

## 2021-08-04 DIAGNOSIS — I10 Essential (primary) hypertension: Secondary | ICD-10-CM | POA: Diagnosis not present

## 2021-08-04 DIAGNOSIS — R52 Pain, unspecified: Secondary | ICD-10-CM | POA: Diagnosis not present

## 2021-08-05 DIAGNOSIS — R519 Headache, unspecified: Secondary | ICD-10-CM | POA: Diagnosis not present

## 2021-08-05 DIAGNOSIS — R52 Pain, unspecified: Secondary | ICD-10-CM | POA: Diagnosis not present

## 2021-08-05 DIAGNOSIS — Z20822 Contact with and (suspected) exposure to covid-19: Secondary | ICD-10-CM | POA: Diagnosis not present

## 2021-08-06 ENCOUNTER — Telehealth: Payer: Self-pay | Admitting: Internal Medicine

## 2021-08-06 NOTE — Telephone Encounter (Signed)
Returned pt call to further discuss symptoms. States the following:  Thursday (10/13) - went to the pharmacy and got her flu vaccine Sunday (10/16) - felt like she developed the flu and immediately went to bed. C/o fatigue, malaise but did not have any further symptoms Monday )10/17) - called PCP and was advised to come in for Virtual rapid flu and Covid swab. Both were negative. States she then developed a fever of 99.1 or less. Has been taking Tylenol. Denies, congestion, sore throat, or cough. Does have dyspnea at rest and has recently developed pain in her right shoulder. Denies her symptoms feeling like angina and requiring use of Nitro as Rx'd. Added she has had 2 MI's in the past and felt her symptoms did not warrant calling her cardiologist, PCP or proceeding to ED for further eval. Added, after she read her discharge instructions following her procedures, felt her symptoms were directly related to her procedures. Advised I would forward her concerns to Dr. Lorenso Courier for further recommendations. Although advised pt to proceed to ED for further evaluation should her symptoms worsen. Educated that what she has described could be relevant to a cardiac episode/event. Verbalized acceptance and understanding.

## 2021-08-06 NOTE — Telephone Encounter (Signed)
Inbound call from patient stating she has been experiencing chest pain, shortness of breath and slight fever ever since having the procedure and is wanting to speak with a nurse.  Please advise.

## 2021-08-06 NOTE — Telephone Encounter (Signed)
Called and spoke to the patient. She states that her symptoms started this past Sunday 10/16 with fatigue, low grade fever, SOB, and some shoulder discomfort. She did not have any of these symptoms prior to this Sunday. She went to her PCP and was tested for flu and COVID, which were negative. These symptoms do not feel like her prior heart attacks. Denies N&V, ab pain, or bleeding. She does have a poor appetite but is still able to tolerate PO intake. She wanted to call to make sure that these symptoms were not related to her EGD and colonoscopy that were done on 9/28. I told the patient that it would be unlikely for her to have complications from her procedures 3 weeks afterwards, but she could call and let us know if her symptoms do not start to improve her the next few days. If her symptoms worsen, then she can reach out to her PCP or to [place] for further advice.

## 2021-08-12 ENCOUNTER — Other Ambulatory Visit: Payer: Self-pay | Admitting: Family Medicine

## 2021-08-12 DIAGNOSIS — Z1231 Encounter for screening mammogram for malignant neoplasm of breast: Secondary | ICD-10-CM

## 2021-08-18 DIAGNOSIS — I341 Nonrheumatic mitral (valve) prolapse: Secondary | ICD-10-CM | POA: Diagnosis not present

## 2021-08-18 DIAGNOSIS — I10 Essential (primary) hypertension: Secondary | ICD-10-CM | POA: Diagnosis not present

## 2021-08-18 DIAGNOSIS — I252 Old myocardial infarction: Secondary | ICD-10-CM | POA: Diagnosis not present

## 2021-08-18 DIAGNOSIS — I255 Ischemic cardiomyopathy: Secondary | ICD-10-CM | POA: Diagnosis not present

## 2021-08-18 DIAGNOSIS — I2510 Atherosclerotic heart disease of native coronary artery without angina pectoris: Secondary | ICD-10-CM | POA: Diagnosis not present

## 2021-08-19 ENCOUNTER — Other Ambulatory Visit: Payer: Self-pay | Admitting: *Deleted

## 2021-08-19 NOTE — Patient Outreach (Signed)
[facility]) Care Management  08/19/2021  CORDELL COKE [birth year] [id number]  Telephone outreach for follow up GI complaints, CAD.  Subjective: Pt saw cardiologist yesterday, Dr. Jac Canavan. Had EKG. No medication changes. F/U in 6 months. She went to her Bible study this week for the first time in 2 months because she feels so much better now. She denies any GI problems at all. She will f/u with her gastroenterologist mid month. She will have another colonoscopy in 6 months. She will see her primary care next week.  Current Barriers:  Chronic Disease Management support and education needs related to CAD  RNCM Clinical Goal(s):  Patient will demonstrate Ongoing health management independence over the next 3 months  through collaboration with RN Care manager, provider, and care team.   Interventions: 1:1 collaboration with primary care provider regarding development and update of comprehensive plan of care as evidenced by provider attestation and co-signature Inter-disciplinary care team collaboration (see longitudinal plan of care) Evaluation of current treatment plan related to  self management and patient's adherence to plan as established by provider  New Care Plan/New goal. Evaluation of current treatment plan related to CAD,  self-management and patient's adherence to plan as established by provider. Discussed plans with patient for ongoing care management follow up and provided patient with direct contact information for care management team Evaluation of current treatment plan related to CAD and patient's adherence to plan as established by provider;  Patient Goals/Self-Care Activities: Patient will call provider office for new concerns or questions   Follow Up Plan:  Telephone follow up appointment with care management team member scheduled for:  in one month. Pt agrees to plan of care.  [name]. Myrtie Neither, MSN, [facility] Gerontological Nurse Practitioner [facility] Care  Management [phone]

## 2021-08-27 DIAGNOSIS — F419 Anxiety disorder, unspecified: Secondary | ICD-10-CM | POA: Diagnosis not present

## 2021-08-27 DIAGNOSIS — I10 Essential (primary) hypertension: Secondary | ICD-10-CM | POA: Diagnosis not present

## 2021-08-27 DIAGNOSIS — I25119 Atherosclerotic heart disease of native coronary artery with unspecified angina pectoris: Secondary | ICD-10-CM | POA: Diagnosis not present

## 2021-08-27 DIAGNOSIS — F3342 Major depressive disorder, recurrent, in full remission: Secondary | ICD-10-CM | POA: Diagnosis not present

## 2021-08-29 ENCOUNTER — Encounter: Payer: Self-pay | Admitting: *Deleted

## 2021-09-01 ENCOUNTER — Ambulatory Visit: Payer: Medicare Other | Attending: Internal Medicine

## 2021-09-01 DIAGNOSIS — E870 Hyperosmolality and hypernatremia: Secondary | ICD-10-CM | POA: Insufficient documentation

## 2021-09-01 LAB — SODIUM: Sodium: 141 mmol/L (ref 136–145)

## 2021-09-05 ENCOUNTER — Ambulatory Visit (INDEPENDENT_AMBULATORY_CARE_PROVIDER_SITE_OTHER): Payer: Medicare Other | Admitting: Internal Medicine

## 2021-09-05 VITALS — BP 118/60 | HR 70 | Ht 62.0 in | Wt 128.2 lb

## 2021-09-05 DIAGNOSIS — R109 Unspecified abdominal pain: Secondary | ICD-10-CM | POA: Diagnosis not present

## 2021-09-05 DIAGNOSIS — E739 Lactose intolerance, unspecified: Secondary | ICD-10-CM

## 2021-09-05 DIAGNOSIS — K449 Diaphragmatic hernia without obstruction or gangrene: Secondary | ICD-10-CM | POA: Diagnosis not present

## 2021-09-05 DIAGNOSIS — K5900 Constipation, unspecified: Secondary | ICD-10-CM

## 2021-09-05 DIAGNOSIS — Z86010 Personal history of colonic polyps: Secondary | ICD-10-CM

## 2021-09-05 NOTE — Patient Instructions (Signed)
If you are age 79 or older, your body mass index should be between 23-30. Your Body mass index is 23.45 kg/m. If this is out of the aforementioned range listed, please consider follow up with your Primary Care Provider.  If you are age 63 or younger, your body mass index should be between 19-25. Your Body mass index is 23.45 kg/m. If this is out of the aformentioned range listed, please consider follow up with your Primary Care Provider.   ________________________________________________________  The [place] [facility] providers would like to encourage you to use [facility] to communicate with providers for non-urgent requests or questions.  Due to long hold times on the telephone, sending your provider a message by [facility] may be a faster and more efficient way to get a response.  Please allow 48 business hours for a response.  Please remember that this is for non-urgent requests.  _______________________________________________________  Please follow up as needed

## 2021-09-05 NOTE — Progress Notes (Signed)
Chief Complaint: Abdominal pain  HPI : 80 year old female w/hx of CAD s/p PCI, GERD, IBS, hypothyroidism, partial epilepsy, asthma, HTN, mitral valve prolapse, lactose intolerance presents with abdominal pain.  Interval History: Her BMs are now more normal. Having 1-2 BMs per day. Eating well. She now has an appetite. Denies abdominal discomfort. Accidentally ingested some lactose, which caused some diarrhea, but she is already aware that she is lactose intolerant. She has been taking her PPI BID   Current Outpatient Medications  Medication Sig Dispense Refill   albuterol (VENTOLIN HFA) 108 (90 Base) MCG/ACT inhaler Inhale 1 puff into the lungs every 4 (four) hours.     Ascorbic Acid (VITAMIN C PO) Take 1 tablet by mouth daily.     aspirin 81 MG chewable tablet Chew 81 mg by mouth in the morning and at bedtime.      Calcium Carbonate-Vitamin D 600-400 MG-UNIT tablet Take 1 tablet by mouth daily.     desvenlafaxine (PRISTIQ) 50 MG 24 hr tablet Take 50 mg by mouth daily.     famotidine (PEPCID) 40 MG tablet Take 40 mg by mouth 2 (two) times daily.     lacosamide ([name]) 50 MG TABS tablet TAKE ONE TABLET BY MOUTH TWICE DAILY 180 tablet 1   levothyroxine (SYNTHROID) 75 MCG tablet Take 75 mcg by mouth daily before breakfast.     loperamide (IMODIUM A-D) 2 MG tablet Take 2 mg by mouth 4 (four) times daily as needed for diarrhea or loose stools.     metoprolol succinate (TOPROL-XL) 25 MG 24 hr tablet Take 25 mg by mouth daily.     Multiple Vitamins-Minerals (PRESERVISION AREDS PO) Take 1 tablet by mouth daily.      nitroGLYCERIN (NITROSTAT) 0.4 MG SL tablet Place 0.4 mg under the tongue every 5 (five) minutes as needed for chest pain.     pantoprazole (PROTONIX) 40 MG tablet Take 1 tablet (40 mg total) by mouth 2 (two) times daily before a meal. 120 tablet 1   Polyethyl Glycol-Propyl Glycol (SYSTANE OP) Apply 1 drop to eye daily as needed.     polyethylene glycol powder (GLYCOLAX/MIRALAX) 17  GM/SCOOP powder Dissolve 1 capful in 8 ounces of fluid and drink daily 255 g 3   sacubitril-valsartan (ENTRESTO) 24-26 MG Take 1 tablet by mouth 2 (two) times daily.      spironolactone (ALDACTONE) 25 MG tablet Take 25 mg by mouth daily.     sucralfate (CARAFATE) 1 g tablet Take 1 g by mouth 4 (four) times daily -  with meals and at bedtime.     zonisamide (ZONEGRAN) 100 MG capsule Take 2 capsules (200 mg total) by mouth at bedtime. 180 capsule 4   No current facility-administered medications for this visit.   Review of Systems: All systems reviewed and negative except where noted in HPI.   Physical Exam: BP 118/60   Pulse 70   Ht 5\' 2"  (1.575 m)   Wt 128 lb 3.2 oz (58.2 kg)   BMI 23.45 kg/m  Constitutional: Pleasant,well-developed, female in no acute distress. HEENT: Normocephalic and atraumatic. Conjunctivae are normal. No scleral icterus. Cardiovascular: Normal rate, regular rhythm.  Pulmonary/chest: Effort normal and breath sounds normal. No wheezing, rales or rhonchi. Abdominal: Soft, nondistended, non-tender. Bowel sounds active throughout. There are no masses palpable. No hepatomegaly. Extremities: no edema Neurological: Alert and oriented to person place and time. Skin: Skin is warm and dry. No rashes noted. Psychiatric: Normal mood and affect. Behavior is normal.  Labs 11/15/18: Nml lipase  Labs 07/01/20: Unremarkable CBC and CMP  Labs 08/16/20: HbA1C 6.5%  Labs 05/22/21: TSH 6.41 (H) (nml 0.34-4.5), negative GI pathogen panel, C dif negative  H pylori breath test 11/25/18: negative  TTE 02/28/21: Left Ventricle: The calculated left ventricular ejection fraction is 50%.    Left Ventricle: Left ventricle size is normal.    Left Ventricle: Wall motion abnormalities as outlined in graphic  representation.    Aortic Valve: The aortic valve is tricuspid. There is mild sclerosis.    Aortic Valve: There is no regurgitation.    Mitral Valve: There is mild regurgitation.     Tricuspid Valve: There is mild regurgitation.    Tricuspid Valve: The right ventricular systolic pressure is normal (<36  mmHg).  KUB 04/08/21: IMPRESSION: Normal bowel gas pattern.  Moderate retained stool in the colon.  EGD 08/19/98:  Colonoscopy 08/19/98: Normal exam  Colonoscopy 04/04/02: Only half of colonoscopy was able to be performed due to patient intolerance. Procedure was normal from splenic flexure to rectum  EGD 07/16/21: - White nummular lesions in esophageal mucosa. Biopsied. - Non-obstructing Schatzki ring. - Large hiatal hernia. - Erythematous mucosa in the antrum. Biopsied. - Normal examined duodenum. Path: 1. Surgical [P], gastric biopsies - REACTIVE GASTROPATHY - NO H. PYLORI OR INTESTINAL METAPLASIA IDENTIFIED - NO H PYLORI 2. Surgical [P], esophageal biopsies - BENIGN SQUAMOUS MUCOSA - NO INCREASED INTRAEPITHELIAL EOSINOPHILS  Colonoscopy 07/16/21: - The examined portion of the ileum was normal. - One 8 mm polyp in the cecum, removed with a cold snare. Resected and retrieved. - One 16 mm polyp in the ascending colon, removed with mucosal resection piecemeal. Resected and retrieved. Clips were placed. - One 3 mm polyp in the descending colon, removed with a cold snare. Resected and retrieved. - Non-bleeding internal hemorrhoids. - Mucosal resection was performed. Resection and retrieval were complete Path: 3. Surgical [P], colon, cecum, polyp (1) - TUBULAR ADENOMA (5 OF 5 FRAGMENTS) - NO HIGH-GRADE DYSPLASIA OR MALIGNANCY IDENTIFIED 4. Surgical [P], colon, ascending, polyp (1) - MULTIPLE FRAGMENTS OF TUBULAR ADENOMA(S) - NO HIGH-GRADE DYSPLASIA OR MALIGNANCY IDENTIFIED 5. Surgical [P], colon, sigmoid, polyp (1) - BENIGN COLONIC MUCOSA (1 OF 1 FRAGMENTS) - NO HIGH-GRADE DYSPLASIA OR MALIGNANCY IDENTIFIED  ASSESSMENT AND PLAN:  Intermittent upper abdominal pain - resolved Constipation - resolved Lactose intolerance History of colon polyps Hiatal  hernia Patient had great response to PPI and Miralax therapy, suggesting that her issues with abdominal discomfort could have been due to reflux or constipation issues. Her last colonoscopy showed two tubular adenomas, one large tubular adenoma that was removed piecemeal. Will plan for another colonoscopy 6 months after her last colonoscopy - Continue daily fiber supplement - Continue PPI BID.  - Continue Miralax - Follow up colonoscopy in about 5 months to assess polypectomy site - RTC 6 months. If still doing well, then can back down on PPI at that time.  Christia Reading, MD

## 2021-09-15 DIAGNOSIS — F322 Major depressive disorder, single episode, severe without psychotic features: Secondary | ICD-10-CM | POA: Diagnosis not present

## 2021-09-16 ENCOUNTER — Ambulatory Visit: Payer: Medicare Other

## 2021-09-18 ENCOUNTER — Encounter: Payer: Self-pay | Admitting: Family Medicine

## 2021-09-18 ENCOUNTER — Other Ambulatory Visit: Payer: Self-pay | Admitting: *Deleted

## 2021-09-18 NOTE — Patient Instructions (Signed)
Visit Information  Thank you for taking time to visit with me today. Please don't hesitate to contact me if I can be of assistance to you before our next scheduled telephone appointment.  Following are the goals we discussed today:  (Copy and paste patient goals from clinical care plan here)  Our next appointment is by telephone on January 4th in the am.  Following is a copy of your care plan:  Care Plan : [place] of Care  Updates made by Deloria Lair, NP since 09/18/2021 12:00 AM     Problem: Depression Identification (Depression) Deleted 09/18/2021     Long-Range Goal: Depressive Symptoms Identified Completed 09/18/2021  Start Date: 12/31/2020  Expected End Date: 04/03/2021  Priority: High  Note:   Evidence-based guidance:  Identify risk for depression by reviewing presenting symptoms and risk factors.  Review use of medications that contribute to depression such as steroid, narcotic, sedative, antihypertensive, beta blocker, cytoxic agent.  Review related metabolic processes, including infection, anemia, thyroid dysfunction, kidney failure, heart failure, alcohol or substance use.  Perform depression screening using standardized tools to obtain baseline intensity of depressive symptoms.  Perform or refer for a full diagnostic interview when positive screening results are noted; use DSM-5 criteria to determine appropriate diagnosis (e.g., major depression, persistent depressive disorder, unspecified depressive   disorder).   Notes: 09/30/20 Pt had been trying to find a mental health provider very near her home and there is no one. Today, she advised she can go to [place] easily. Provided 4 providers names in the area for her to choose from. Advised to call and see if they are taking new pts and if they will take her insurance. Pt to call me if none of these work out. There are also telephonic/video professionals available. Will contnue to monitor monthly over the next 3  months. 12/31/20 Suzanne Galloway continues to report sxs of depression but she works through them with activity and distraction. She goes to [employer] 3 times a week which she loves. She is attending church, she sees friends and family. She is on medication and has also been taking an antianxiety medication which has helped greatly. Her chronic illnesses CAD, HF, IBS all are relatively controlled. She is on a waiting list to start counseling. Encouraged her to call NP if she needs to talk.    Problem: Depression   Priority: High  Onset Date: 09/18/2021     Long-Range Goal: Pt will report success (not helpful, helpful) of new treatment on each monthly call over the next 3 months   Start Date: 09/18/2021  Expected End Date: 12/19/2021  Priority: High  Note:   Current Barriers:  No patient barriers  RNCM Clinical Goal(s):  Patient will take all medications exactly as prescribed and will call provider for medication related questions as evidenced by pt report.    attend all scheduled medical appointments:   as evidenced by pt report         Evidence of collaboration with RN Care manager, provider, and care team.   Interventions: Evaluation of current treatment plan related to  self management and patient's adherence to plan as established by provider  Patient Goals/Self-Care Activities: as evidenced by pt on each monthly encounter. Take medications as prescribed   Attend all scheduled provider appointments Call provider office for new concerns or questions       Problem: Multiple co-morbidites   Priority: Medium  Onset Date: 09/18/2021     Long-Range Goal: Pt  will report changes in health status at onset of sx as evidenced by pt report and chart review over the next 3 months.   Start Date: 09/18/2021  Priority: Medium  Note:   Current Barriers:  None  RNCM Clinical Goal(s):  Patient will demonstrate ongoing health management independence as evidenced by pt report each month over the  next 3 months.        work with NP, PCP to address new and chronic illness problems as evidenced by pt report and chart review.   Interventions: Inter-disciplinary care team collaboration (see longitudinal plan of care) Evaluation of current treatment plan related to  self management and patient's adherence to plan as established by provider  Patient Goals/Self-Care Activities: As evidenced by pt reporting and chart review each month for the next 3 months. Take medications as prescribed   Attend all scheduled provider appointments Call provider office for new concerns or questions        The patient verbalized understanding of instructions, educational materials, and care plan provided today and declined offer to receive copy of patient instructions, educational materials, and care plan.   Telephone follow up appointment with care management team member scheduled for: 10/22/21  Kayleen Memos C. Myrtie Neither, MSN, [facility] Gerontological Nurse Practitioner [facility] Care Management [phone]

## 2021-09-18 NOTE — Patient Outreach (Signed)
[facility]) Care Management Geriatric Nurse Practitioner Note   09/18/2021 Name:  Suzanne Galloway MRN:  [id number] DOB:  10-Sep-1941  Summary: Confusion on medication instructions. Confusion on appt times  Recommendations/Changes made from today's visit: New care plan developed see below.  Subjective: Suzanne Galloway is an 80 y.o. year old female who is a primary patient of Suzanne Melter, MD. The care management team was consulted for assistance with care management and/or care coordination needs.    Geriatric Nurse Practitioner completed Telephone Visit today.   Medications Reviewed Today     Reviewed by Deloria Lair, NP (Nurse Practitioner) on 09/18/21 at 1038  Med List Status: <None>   Medication Order Taking? Sig Documenting Provider Last Dose Status Informant  albuterol (VENTOLIN HFA) 108 (90 Base) MCG/ACT inhaler [id number]  Inhale 1 puff into the lungs every 4 (four) hours. [provider]  Active   Ascorbic Acid (VITAMIN C PO) [id number]  Take 1 tablet by mouth daily. [provider]  Active Self  aspirin 81 MG chewable tablet [id number]  Chew 81 mg by mouth in the morning and at bedtime.  [provider]  Active Self  Calcium Carbonate-Vitamin D 600-400 MG-UNIT tablet [id number]  Take 1 tablet by mouth daily. [provider]  Active Self           Med Note Conley Canal, EMMA   Thu May 16, 2020  4:32 PM)      Discontinued 09/18/21 1017 (Discontinued by provider)            Med Note Myrtie Neither, Meklit Cotta   Tue Jul 15, 2021 11:46 AM) Dose now increased to 100 mg daily, per Dr. Olen Pel.  famotidine (PEPCID) 40 MG tablet [id number]  Take 40 mg by mouth 2 (two) times daily. [provider]  Active Self  lacosamide ([name]) 50 MG TABS tablet [id number]  TAKE ONE TABLET BY MOUTH TWICE DAILY Penumalli, Earlean Polka, MD  Active   levothyroxine (SYNTHROID) 75 MCG tablet [id number]  Take 75 mcg by mouth daily before breakfast. [provider]  Active   loperamide (IMODIUM A-D) 2 MG tablet [id number]  Take 2 mg by mouth 4 (four) times daily as needed for diarrhea or loose stools. [provider]  Active Self  metoprolol succinate (TOPROL-XL) 25 MG 24 hr tablet [id number]  Take 25 mg by mouth daily. [provider]  Active Self  Multiple Vitamins-Minerals (PRESERVISION AREDS PO) [id number]  Take 1 tablet by mouth daily.  [provider]  Active Self           Med Note Conley Canal, EMMA   Thu May 16, 2020  4:24 PM)    nitroGLYCERIN (NITROSTAT) 0.4 MG SL tablet [id number]  Place 0.4 mg under the tongue every 5 (five) minutes as needed for chest pain. [provider]  Active            Med Note Conley Canal, EMMA   Thu May 16, 2020  4:15 PM) Has not needed to take  pantoprazole (PROTONIX) 40 MG tablet [id number]  Take 1 tablet (40 mg total) by mouth 2 (two) times daily before a meal. Sharyn Creamer, MD  Active   Polyethyl Glycol-Propyl Glycol (SYSTANE OP) [id number]  Apply 1 drop to eye daily as needed. [provider]  Active Self  polyethylene glycol powder (GLYCOLAX/MIRALAX) 17 GM/SCOOP powder [id number]  Dissolve 1 capful in 8 ounces of fluid and drink daily Sharyn Creamer, MD  Active   sacubitril-valsartan [facility])  24-26 MG [id number]  Take 1 tablet by mouth 2 (two) times daily.  [provider]  Active Self  spironolactone (ALDACTONE) 25 MG tablet [id number]  Take 25 mg by mouth daily. [provider]  Active Self  sucralfate (CARAFATE) 1 g tablet [id number]  Take 1 g by mouth 4 (four) times daily -  with meals and at bedtime. [provider]  Active Self           Med Note Conley Canal, EMMA   Thu May 16, 2020  4:16 PM)    zonisamide (ZONEGRAN) 100 MG capsule [id number]  Take 2 capsules (200 mg total) by mouth at bedtime. Debbora Presto, NP  Active            SDOH:  (Social Determinants of Health) assessments and interventions performed:   Care Plan  Review of patient  past medical history, allergies, medications, health status, including review of consultants reports, laboratory and other test data, was performed as part of comprehensive evaluation for care management services.   Care Plan : RN Care Manager Plan of Care  Updates made by Deloria Lair, NP since 09/18/2021 12:00 AM     Problem: Depression Identification (Depression) Deleted 09/18/2021     Long-Range Goal: Depressive Symptoms Identified Completed 09/18/2021  Start Date: 12/31/2020  Expected End Date: 04/03/2021  Priority: High  Note:   Evidence-based guidance:  Identify risk for depression by reviewing presenting symptoms and risk factors.  Review use of medications that contribute to depression such as steroid, narcotic, sedative, antihypertensive, beta blocker, cytoxic agent.  Review related metabolic processes, including infection, anemia, thyroid dysfunction, kidney failure, heart failure, alcohol or substance use.  Perform depression screening using standardized tools to obtain baseline intensity of depressive symptoms.  Perform or refer for a full diagnostic interview when positive screening results are noted; use DSM-5 criteria to determine appropriate diagnosis (e.g., major depression, persistent depressive disorder, unspecified depressive   disorder).   Notes: 09/30/20 Pt had been trying to find a mental health provider very near her home and there is no one. Today, she advised she can go to [place] easily. Provided 4 providers names in the area for her to choose from. Advised to call and see if they are taking new pts and if they will take her insurance. Pt to call me if none of these work out. There are also telephonic/video professionals available. Will contnue to monitor monthly over the next 3 months. 12/31/20 Suzanne Galloway continues to report sxs of depression but she works through them with activity and distraction. She goes to [employer] 3 times a week which she loves. She is  attending church, she sees friends and family. She is on medication and has also been taking an antianxiety medication which has helped greatly. Her chronic illnesses CAD, HF, IBS all are relatively controlled. She is on a waiting list to start counseling. Encouraged her to call NP if she needs to talk.    Problem: Depression   Priority: High  Onset Date: 09/18/2021     Long-Range Goal: Pt will report success (not helpful, helpful) of new treatment on each monthly call over the next 3 months   Start Date: 09/18/2021  Expected End Date: 12/19/2021  Priority: High  Note:   Current Barriers:  No patient barriers  RNCM Clinical Goal(s):  Patient will take all medications exactly as prescribed and will call provider for medication related questions as evidenced by pt report.    attend all scheduled medical appointments:  as evidenced by pt report         Evidence of collaboration with Consulting civil engineer, provider, and care team.   Interventions: Evaluation of current treatment plan related to  self management and patient's adherence to plan as established by provider  Patient Goals/Self-Care Activities: as evidenced by pt on each monthly encounter. Take medications as prescribed   Attend all scheduled provider appointments Call provider office for new concerns or questions       Problem: Multiple co-morbidites   Priority: Medium  Onset Date: 09/18/2021     Long-Range Goal: Pt will report changes in health status at onset of sx as evidenced by pt report and chart review over the next 3 months.   Start Date: 09/18/2021  Priority: Medium  Note:   Current Barriers:  None  RNCM Clinical Goal(s):  Patient will demonstrate ongoing health management independence as evidenced by pt report each month over the next 3 months.        work with NP, PCP to address new and chronic illness problems as evidenced by pt report and chart review.   Interventions: Inter-disciplinary care team  collaboration (see longitudinal plan of care) Evaluation of current treatment plan related to  self management and patient's adherence to plan as established by provider  Patient Goals/Self-Care Activities: As evidenced by pt reporting and chart review each month for the next 3 months. Take medications as prescribed   Attend all scheduled provider appointments Call provider office for new concerns or questions         Plan: Telephone follow up appointment with care management team member scheduled for:  First week in January. The patient will call NP or MD  as advised to report any problems in the interim.   [name]. Myrtie Neither, MSN, [facility] Gerontological Nurse Practitioner [facility] Care Management [phone]

## 2021-09-29 DIAGNOSIS — E039 Hypothyroidism, unspecified: Secondary | ICD-10-CM | POA: Diagnosis not present

## 2021-09-30 ENCOUNTER — Other Ambulatory Visit: Payer: Self-pay | Admitting: Diagnostic Neuroimaging

## 2021-09-30 DIAGNOSIS — G40109 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, not intractable, without status epilepticus: Secondary | ICD-10-CM

## 2021-10-09 NOTE — Patient Instructions (Addendum)
Below is our plan:  We will continue lacosamide 50mg  twice daily and zonisamide 200mg  at bedtime. I will order an EEG to evaluated for any seizure activity. I will also send you to neuropsychology for concerns of memory loss. They will help [place] determine potential cause of memory concerns. Please ask Mycal Conde to listen to you when you sleep. If concerns of sleep apnea are present, we can consider sleep study.   Please make sure you are staying well hydrated. I recommend 50-60 ounces daily. Well balanced diet and regular exercise encouraged. Consistent sleep schedule with 6-8 hours recommended.   Please continue follow up with care team as directed.   Follow up with me in 4-6 months.   You may receive a survey regarding today's visit. I encourage you to leave honest feed back as I do use this information to improve patient care. Thank you for seeing me today!   Management of Memory Problems   There are some general things you can do to help manage your memory problems.  Your memory may not in fact recover, but by using techniques and strategies you will be able to manage your memory difficulties better.   1)  Establish a routine. Try to establish and then stick to a regular routine.  By doing this, you will get used to what to expect and you will reduce the need to rely on your memory.  Also, try to do things at the same time of day, such as taking your medication or checking your calendar first thing in the morning. Think about think that you can do as a part of a regular routine and make a list.  Then enter them into a daily planner to remind you.  This will help you establish a routine.   2)  Organize your environment. Organize your environment so that it is uncluttered.  Decrease visual stimulation.  Place everyday items such as keys or cell phone in the same place every day (ie.  Basket next to front door) Use post it notes with a brief message to yourself (ie. Turn off light, lock the door) Use  labels to indicate where things go (ie. Which cupboards are for food, dishes, etc.) Keep a notepad and pen by the telephone to take messages   3)  Memory Aids A diary or journal/notebook/daily planner Making a list (shopping list, chore list, to do list that needs to be done) Using an alarm as a reminder (kitchen timer or cell phone alarm) Using cell phone to store information (Notes, Calendar, Reminders) Calendar/White board placed in a prominent position Post-it notes   In order for memory aids to be useful, you need to have good habits.  It's no good remembering to make a note in your journal if you don't remember to look in it.  Try setting aside a certain time of day to look in journal.   4)  Improving mood and managing fatigue. There may be other factors that contribute to memory difficulties.  Factors, such as anxiety, depression and tiredness can affect memory. Regular gentle exercise can help improve your mood and give you more energy. Simple relaxation techniques may help relieve symptoms of anxiety Try to get back to completing activities or hobbies you enjoyed doing in the past. Learn to pace yourself through activities to decrease fatigue. Find out about some local support groups where you can share experiences with others. Try and achieve 7-8 hours of sleep at night.

## 2021-10-09 NOTE — Progress Notes (Signed)
Chief Complaint  Patient presents with   Follow-up    Rm 10, alone. Here for SZ f/u. Pt reports doing well. No sz like activity since last OV. Pt reports depresion has worsened. Pt reports having short term memory loss. She will tell herself what she will do later in the day and will forget what she had said. MMSE 28     HISTORY OF PRESENT ILLNESS: 10/21/21 ALL: Suzanne Galloway returns for follow up for seizures. She continues zonisamide 200mg  at bedtime and lacosamide 50mg  BID. No recent seizures. Last event 2018. Deja vu episodes are worsening. She reports they occur once daily. She mentions that her daughter has expressed concerns of her "spacing out". She will look off into space and does not seem like she is aware of her surroundings. This has been worse over the past month after starting new depression medication.  She continues to have short term memory concerns. She can't remember tasks she needs to complete later in the day. She continues to follow closely with PCP for depression. She was recently started on Wellbutrin but discontinued due to increased irritability and insomnia. She resumed Pristiq 50mg  daily. She does not sleep well. She wakes in the middle of the night and can not go back to sleep. She is unsure if she snores. She reports having a sleep study "years ago" but thinks it was normal. No significant dry mouth or morning headaches.   She lives with her daughter, Suzanne Galloway, and son in law. She takes care of her own fiances. She is retired. 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 She is able to perform all ADLs independently. She manages her own medications. She drives on occasion but states that her daughter usually does the driving.   02/18/2021 VP: UPDATE (02/18/21, VRP): Since last visit, doing well except this past weekend. More stress, anxiety, depression, was in process of changing pristiq to zoloft, then on Sunday was at home, stood up, felt lightheaded. Then woozy, felts large  desynchronized movements in arms and legs, laid on to bed. Was awake and aware through the event, 1 minute. Then tired, but back to baseline within 10-15 minutes. Had dinner with family and they did not notice any issues (patient did not tell them about the event). Deja vu spells stable.     12 /27/2021 ALL: Suzanne Galloway is a 80 y.o. female here today for follow up for seizures. She continues zonisamide 200mg  at bedtime and [name] 50mg  BID. Last major seizure in 09/2017. She has continued to have deja vu spells but feels they are much less frequent than they were. She may have a spell 3-4 times a year. She continues escitalopram 20mg  daily and alprazolam 0.25mg  TID. Cardiac stent placed post STEMI in 03/10/2020. She contracted Covid over the summer which delayed cardiac rehab. She recently completed cardiac rehab. She continues to have concerns of memory loss. She has trouble finding words. She uses words inappropriately but immediately recognizes it. Her daughter is with her today and tells a story where Suzanne Galloway described a 58 second event where she was confused on 12/24. She asked herself "where was Suzanne Galloway", then asked herself "who is Suzanne Galloway" and then realized that Suzanne Galloway was standing in the room with her. Suzanne Galloway is her grandson. She denies similar events happening since or before this event. She lives with her youngest daughter and son in [employer]. She manages her own finances. She doses her own medications. She is able to perform ADL's She  is sleeping well. She feels mood is stable. She tried to wean alprazolam but feels anxiety worsened. She is scheduled to see a psychologist in January.   She is fully vaccinated with booster for Covid and flu.    HISTORY (copied from previous note)  UPDATE (10/03/19, VRP): Since last visit, more stress and tragedy from her friends medical issues. More panic and anxiety and stomach issues. Deja vu spells continue, slightly more so patient increased her zonisamide. Symptoms  are otherwise stable.    UPDATE (05/29/19, VRP): Since last visit, doing poorly according to family. Continues with depression and anxiety. Severity is moderate. No alleviating or aggravating factors. Tolerating meds. Weekly deja vu spells. No major seizures. Some intermittent memory loss.    UPDATE (08/10/18, VRP): Since last visit, struggling with anxiety and depression sxs. No def sz except mild deja vu spell 1 week ago. No alleviating or aggravating factors. Tolerating meds. Psychiatry has increased lexapro 1 month ago.   PRIOR HPI (02/01/18): 80 year old female here for evaluation of seizures.   01/22/12 was a stressful year for patient.  Patient was taking care of her husband who ultimately passed away.  Then in 01/21/13 she began to have intermittent episodes of dj vu, anxiety, hot sensation in her body, a liquid sensation going down her chest and abdomen.  Episodes can last minutes at a time.  These occurred every few weeks.  Over time the increased in frequency.  These were attributed to anxiety and patient was treated with Lexapro, Wellbutrin and Ativan.   In 2015/01/22 patient was evaluated by neurologist for tremor.  She also reported episodes of dj vu and therefore EEG was ordered.  No epileptiform discharges were found.  This is followed up with ambulatory EEG which was also normal.   May 2018 patient presented to the hospital with retrograde memory loss, patient thinking it was 01-22-2015, no recent memory of a beach trip recently, and other confusion.  Hospital EEG showed occasional sharp wave discharges in the right posterior frontal region.  Patient was then referred to [facility] for video EEG monitoring.  Occasional right temporal sharp wave activity was noted.  Patient had 2 electrographic seizures.  Patient was started on Lamictal but then this was discontinued due to lack of effectiveness and possible side effect.  This was changed to oxcarbazepine but this caused paranoia and confusion.  Patient  was then started on zonisamide.  Patient developed some tremor which was initially thought to be anxiety related, but then possibly related to zonisamide, and therefore dose was slightly reduced.   Last event of possible seizure occurred 10/13/17, with memory lapse, confusion, dj vu sensation, anxiety sensation.   Patient currently on zonisamide 200 mg at bedtime for seizure prevention.  No significant tremors.   She is also on Lexapro 20 mg/day for anxiety and depression.  She is on lorazepam 0.5 mg twice a day for anxiety.  Patient still feels quite anxious and depressed at times, related to being stuck at home.  However when she is able to get out of the house she feels like going back home.  She is working with a Teacher, music and psychologist now.   Patient then requested a second opinion with our office.   REVIEW OF SYSTEMS: Out of a complete 14 system review of symptoms, the patient complains only of the following symptoms, memory loss, anxiety and all other reviewed systems are negative.   ALLERGIES: Allergies  Allergen Reactions   Codeine Other (See Comments)    "  makes me crazy"   Lamictal [Lamotrigine] Other (See Comments)    Increased siezure activity.   Oxcarbazepine Other (See Comments)    Over sedation   Symbicort [Budesonide-Formoterol Fumarate] Other (See Comments)    Had 2 "seizures" after taking.    Atorvastatin    Buspirone     Increased depression   Carbamazepine Other (See Comments)    Paranoia, confusion   Depakote [Divalproex Sodium] Nausea And Vomiting    lethargy   Dilaudid [Hydromorphone] Other (See Comments)    Reaction:  Hallucinations    Dulera [Mometasone Furo-Formoterol Fum] Other (See Comments)    Reaction:  Deja vu seizures    Lactose Intolerance (Gi) Diarrhea   Morphine And Related Nausea And Vomiting   Percocet [Oxycodone-Acetaminophen] Other (See Comments)    Reaction:  Hallucinations    Pravastatin Other (See Comments)    Reaction:   Muscle pain, "weird dreams"   Vesicare [Solifenacin] Other (See Comments)    Reaction:  Constipation    Zonisamide Other (See Comments)    At higher doses can cause tremors, anxiety     HOME MEDICATIONS: Outpatient Medications Prior to Visit  Medication Sig Dispense Refill   albuterol (VENTOLIN HFA) 108 (90 Base) MCG/ACT inhaler Inhale 1 puff into the lungs every 4 (four) hours.     Ascorbic Acid (VITAMIN C PO) Take 1 tablet by mouth daily.     aspirin 81 MG chewable tablet Chew 81 mg by mouth in the morning and at bedtime.      Calcium Carbonate-Vitamin D 600-400 MG-UNIT tablet Take 1 tablet by mouth daily.     desvenlafaxine (PRISTIQ) 50 MG 24 hr tablet Take 50 mg by mouth daily.     famotidine (PEPCID) 40 MG tablet Take 40 mg by mouth 2 (two) times daily.     levothyroxine (SYNTHROID) 75 MCG tablet Take 75 mcg by mouth daily before breakfast.     loperamide (IMODIUM A-D) 2 MG tablet Take 2 mg by mouth 4 (four) times daily as needed for diarrhea or loose stools.     metoprolol succinate (TOPROL-XL) 25 MG 24 hr tablet Take 25 mg by mouth daily.     Multiple Vitamins-Minerals (PRESERVISION AREDS PO) Take 1 tablet by mouth daily.      nitroGLYCERIN (NITROSTAT) 0.4 MG SL tablet Place 0.4 mg under the tongue every 5 (five) minutes as needed for chest pain.     pantoprazole (PROTONIX) 40 MG tablet Take 1 tablet (40 mg total) by mouth 2 (two) times daily before a meal. 180 tablet 1   Polyethyl Glycol-Propyl Glycol (SYSTANE OP) Apply 1 drop to eye daily as needed.     polyethylene glycol powder (GLYCOLAX/MIRALAX) 17 GM/SCOOP powder Dissolve 1 capful in 8 ounces of fluid and drink daily 255 g 3   sacubitril-valsartan (ENTRESTO) 24-26 MG Take 1 tablet by mouth 2 (two) times daily.      spironolactone (ALDACTONE) 25 MG tablet Take 25 mg by mouth daily.     sucralfate (CARAFATE) 1 g tablet Take 1 g by mouth 4 (four) times daily -  with meals and at bedtime.     lacosamide ([name]) 50 MG TABS tablet  TAKE ONE TABLET BY MOUTH TWICE DAILY 180 tablet 1   zonisamide (ZONEGRAN) 100 MG capsule Take 2 capsules (200 mg total) by mouth at bedtime. 180 capsule 4   No facility-administered medications prior to visit.     PAST MEDICAL HISTORY: Past Medical History:  Diagnosis Date   Anxiety  Arthritis    "hands, back, toes" (01/01/2015)   Asthma    Chronic bronchitis ([place])    "get it just about q yr" (01/01/2015)   Coronary artery disease    S/P STEMI 03/10/20, DES LAD, VFib Arrest   Depression    GERD (gastroesophageal reflux disease)    Hearing loss    hearing aids   Heart attack ([place])    x 2 in one day   Hemorrhoids    Hiatal hernia    High cholesterol    Hyperlipemia    Hypertension    Hypothyroidism    IBS (irritable bowel syndrome)    Mitral valve prolapse    Schatzki's ring    Seizures ([place])    dx Aug 2018, most recent seizure 10/13/17, partial complex   Tubular adenoma of colon    Urinary incontinence      PAST SURGICAL HISTORY: Past Surgical History:  Procedure Laterality Date   BACK SURGERY     CARDIAC CATHETERIZATION     CARPAL TUNNEL RELEASE Bilateral 2008   CYSTECTOMY Right 09/2014   "hand"    DILATION AND CURETTAGE OF UTERUS     FINGER SURGERY Bilateral    "scraped arthritis out of thumbs"   [place]; 12/2009   ; [facility] Endo 01/16/2010   TOE SURGERY Left 09/21/2005   "took bone out of 2nd toe; took cyst out"   TUBAL LIGATION     VAGINAL HYSTERECTOMY  1970     FAMILY HISTORY: Family History  Problem Relation Age of Onset   COPD Father        smoked   Heart disease Father    Esophageal cancer Father        smoked   Transient ischemic attack Father    Cancer Father        kidney   Deep vein thrombosis Brother    Parkinson's disease Mother    Heart failure Mother    Diabetes Mother    Cancer Daughter    Diabetes Maternal Grandmother    Diabetes Paternal Grandfather       SOCIAL HISTORY: Social History   Socioeconomic History   Marital status: Widowed    Spouse name: Not on file   Number of children: 3   Years of education: 35   Highest education level: GED or equivalent  Occupational History   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  Tobacco Use   Smoking status: Never   Smokeless tobacco: Never  Vaping Use   Vaping Use: Never used  Substance and Sexual Activity   Alcohol use: Yes    Comment: rarely   Drug use: No   Sexual activity: Not Currently  Other Topics Concern   Not on file  Social History Narrative   10/03/19 Lives with dgtr, Suzanne Galloway and son-in-law    Retired, widowed   Education- high school   Caffeine 1 cup daily   Social Determinants of Health   Financial Resource Strain: Not on file  Food Insecurity: No Food Insecurity   Worried About Charity fundraiser in the Last Year: Never true   Arboriculturist in the Last Year: Never true  Transportation Needs: No Transportation Needs   Lack of Transportation (Medical): No   Lack of Transportation (Non-Medical): No  Physical Activity: Not on file  Stress: Not on file  Social Connections: Not on file  Intimate Partner Violence: Not on file      PHYSICAL EXAM  Vitals:   10/21/21 1044  BP: 119/62  Pulse: 74  Weight: 122 lb 8 oz (55.6 kg)  Height: 5\' 2"  (1.575 m)    Body mass index is 22.41 kg/m.   Generalized: Well developed, in no acute distress  Cardiology: normal rate and rhythm, no murmur auscultated  Respiratory: clear to auscultation bilaterally    Neurological examination  Mentation: Alert oriented to time, place, history taking. Follows all commands speech and language fluent Cranial nerve II-XII: Pupils were equal round reactive to light. Extraocular movements were full, visual field were full on confrontational test. Facial sensation and strength were normal. Head turning and shoulder shrug  were normal and symmetric. Motor: The motor  testing reveals 5 over 5 strength of all 4 extremities. Good symmetric motor tone is noted throughout.  Sensory: Sensory testing is intact to soft touch on all 4 extremities. No evidence of extinction is noted.  Coordination: Cerebellar testing reveals good finger-nose-finger and heel-to-shin bilaterally.  Gait and station: Gait is normal.  Reflexes: Deep tendon reflexes are symmetric and normal bilaterally.     DIAGNOSTIC DATA (LABS, IMAGING, TESTING) - I reviewed patient records, labs, notes, testing and imaging myself where available.  Lab Results  Component Value Date   WBC 7.3 02/19/2017   HGB 12.5 02/19/2017   HCT 37.3 02/19/2017   MCV 92.1 02/19/2017   PLT 260 02/19/2017      Component Value Date/Time   NA 138 02/19/2017 0935   K 4.6 02/19/2017 0935   CL 101 02/19/2017 0935   CO2 28 02/19/2017 0935   GLUCOSE 112 (H) 02/19/2017 0935   BUN 14 02/19/2017 0935   CREATININE 0.87 02/19/2017 0935   CREATININE 0.82 12/27/2014 0714   CALCIUM 9.4 02/19/2017 0935   PROT 7.4 02/19/2017 0935   ALBUMIN 4.6 02/19/2017 0935   AST 28 02/19/2017 0935   ALT 21 02/19/2017 0935   ALKPHOS 69 02/19/2017 0935   BILITOT 0.4 02/19/2017 0935   GFRNONAA >60 02/19/2017 0935   GFRAA >60 02/19/2017 0935   Lab Results  Component Value Date   CHOL 174 01/02/2015   HDL 37 (L) 01/02/2015   LDLCALC 105 (H) 01/02/2015   TRIG 158 (H) 01/02/2015   CHOLHDL 4.7 01/02/2015   Lab Results  Component Value Date   HGBA1C 6.3 (H) 01/03/2015   Lab Results  Component Value Date   VITAMINB12 312 02/20/2017   Lab Results  Component Value Date   TSH 10.174 (H) 02/20/2017    MMSE - Mini Mental State Exam 10/21/2021 10/14/2020 10/03/2019  Not completed: - - -  Orientation to time 5 5 4   Orientation to Place 5 5 4   Registration 3 3 3   Attention/ Calculation 3 1 1   Recall 3 3 3   Language- name 2 objects 2 2 2   Language- repeat 1 1 1   Language- follow 3 step command 3 3 3   Language- read & follow  direction 1 1 1   Write a sentence 1 1 1   Copy design 1 1 1   Copy design-comments - 9 animals -  Total score 28 26 24      ASSESSMENT AND PLAN  80 y.o. year old female  has a past medical history of Anxiety, Arthritis, Asthma, Chronic bronchitis ([place]), Coronary artery disease, Depression, GERD (gastroesophageal reflux disease), Hearing loss, Heart attack ([place]), Hemorrhoids, Hiatal hernia, High cholesterol, Hyperlipemia, Hypertension, Hypothyroidism, IBS (irritable bowel syndrome), Mitral valve prolapse,  Schatzki's ring, Seizures ([place]), Tubular adenoma of colon, and Urinary incontinence. 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  Dj vu events have become more frequent, recently, and seems to be correlated with change of depression management. She will continue to monitor closely. I will order EEG for monitoring. We will continue zonisamide 200 mg daily at bedtime and will lacosamide 50 mg twice daily.  She continues to have concerns of short term memory loss. MMSE 28/30. I will send her to neuropsychology per her request. She has had normal evaluations in the past and we will assess for any change. Memory compensation strategies reviewed. She was encouraged to continue close follow-up with primary care for depression/anxiety management. Healthy lifestyle habits encouraged.  She will follow-up with [place] in 4-6 months, sooner if needed.  She verbalizes understanding and agreement with this plan.   Orders Placed This Encounter  Procedures   Ambulatory referral to Neuropsychology    Referral Priority:   Routine    Referral Type:   Psychiatric    Referral Reason:   Specialty Services Required    Requested  Specialty:   Psychology    Number of Visits Requested:   1   EEG adult    Standing Status:   Future    Standing Expiration Date:   10/21/2022    Order Specific Question:   Where should this test be performed?    Answer:   [facility]    Order Specific Question:   Reason for exam    Answer:   Seizure    Order Specific Question:   Reason for exam    Answer:   Other (see comment)    Order Specific Question:   Comment    Answer:   memory loss     I spent 30 minutes of face-to-face and non-face-to-face time with patient.  This included previsit chart review, lab review, study review, order entry, electronic health record documentation, patient education.   Debbora Presto, MSN, FNP-C 10/21/2021, 1:06 PM  [facility] [address], [place], [place] [address] [phone]

## 2021-10-14 ENCOUNTER — Other Ambulatory Visit: Payer: Self-pay

## 2021-10-14 ENCOUNTER — Ambulatory Visit: Payer: Medicare Other | Admitting: Family Medicine

## 2021-10-14 MED ORDER — PANTOPRAZOLE SODIUM 40 MG PO TBEC: 40.0000 mg | DELAYED_RELEASE_TABLET | Freq: Two times a day (BID) | ORAL | 1 refills | Status: DC

## 2021-10-15 DIAGNOSIS — Z20822 Contact with and (suspected) exposure to covid-19: Secondary | ICD-10-CM | POA: Diagnosis not present

## 2021-10-15 DIAGNOSIS — R059 Cough, unspecified: Secondary | ICD-10-CM | POA: Diagnosis not present

## 2021-10-21 ENCOUNTER — Encounter: Payer: Self-pay | Admitting: Family Medicine

## 2021-10-21 ENCOUNTER — Ambulatory Visit: Payer: Medicare Other | Admitting: Family Medicine

## 2021-10-21 ENCOUNTER — Ambulatory Visit: Payer: Medicare Other

## 2021-10-21 VITALS — BP 119/62 | HR 74 | Ht 62.0 in | Wt 122.5 lb

## 2021-10-21 DIAGNOSIS — F32A Depression, unspecified: Secondary | ICD-10-CM | POA: Diagnosis not present

## 2021-10-21 DIAGNOSIS — F419 Anxiety disorder, unspecified: Secondary | ICD-10-CM | POA: Diagnosis not present

## 2021-10-21 DIAGNOSIS — G40109 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, not intractable, without status epilepticus: Secondary | ICD-10-CM

## 2021-10-21 DIAGNOSIS — R413 Other amnesia: Secondary | ICD-10-CM | POA: Diagnosis not present

## 2021-10-21 MED ORDER — LACOSAMIDE 50 MG PO TABS: ORAL_TABLET | ORAL | 1 refills | Status: DC

## 2021-10-21 MED ORDER — ZONISAMIDE 100 MG PO CAPS: 200.0000 mg | ORAL_CAPSULE | Freq: Every day | ORAL | 4 refills | Status: DC

## 2021-10-22 ENCOUNTER — Other Ambulatory Visit: Payer: Self-pay | Admitting: *Deleted

## 2021-10-22 NOTE — Patient Instructions (Signed)
Visit Information  Thank you for taking time to visit with me today. Please don't hesitate to contact me if I can be of assistance to you before our next scheduled telephone appointment.  Following are the goals we discussed today:  (Copy and paste patient goals from clinical Galloway plan here)  Our next appointment is by telephone, Friday, Feb 3rd.  Please call the Galloway guide team at [phone] if you need to cancel or reschedule your appointment.   Following is a copy of your Galloway plan:  Galloway Plan : Suzanne Galloway  Updates made by Suzanne Lair, NP since 10/22/2021 12:00 AM     Problem: Depression   Priority: High  Onset Date: 09/18/2021     Long-Range Goal: Pt will report success (not helpful, helpful) of new treatment on each monthly call over the next 3 months   Start Date: 09/18/2021  Expected End Date: 12/19/2021  Priority: High  Note:    10/22/21:  (Status: Goal on Track (progressing): YES.) Long Term Goal  Reviewed medications with patient and discussed Current regimen Pristique 50 mg and pt feels she is getting a benefit -                     Successful Reviewed scheduled/upcoming provider appointments including neurology, psychology, PCP. Taking meds as prescribed. Reinforced calling NP or MD if has new problem or questions.  Current Barriers:  No patient barriers  RNCM Clinical Goal(s):  Patient will take all medications exactly as prescribed and will call provider for medication related questions as evidenced by pt report.    attend all scheduled medical appointments:   as evidenced by pt report         Evidence of collaboration with RN Galloway manager, provider, and Galloway team.   Interventions: Evaluation of current treatment plan related to  self management and patient's adherence to plan as established by provider  Patient Goals/Self-Galloway Activities: as evidenced by pt on each monthly encounter. Take medications as prescribed   Attend all scheduled  provider appointments Call provider office for new concerns or questions       Problem: Multiple co-morbidites   Priority: Medium  Onset Date: 09/18/2021     Long-Range Goal: Pt will report changes in health status at onset of sx as evidenced by pt report and chart review over the next 3 months.   Start Date: 09/18/2021  Priority: Medium  Note:    10/22/21:  (Status: Goal on Track (progressing): YES.) Long Term Goal  Evaluation of current treatment plan related to chronic illnesses and patient's adherence to plan as established by provider Provided education to patient re: Diagnositc tests ordered by neurology for memory loss and potential treatment. Reviewed medications with patient and discussed potential new medication for memory loss which may be ordered by neurology.   Current Barriers:  None  RNCM Clinical Goal(s):  Patient will demonstrate ongoing health management independence as evidenced by pt report each month over the next 3 months.        work with NP, PCP to address new and chronic illness problems as evidenced by pt report and chart review.   Interventions: Inter-disciplinary Galloway team collaboration (see longitudinal plan of Galloway) Evaluation of current treatment plan related to  self management and patient's adherence to plan as established by provider  Patient Goals/Self-Galloway Activities: As evidenced by pt reporting and chart review each month for the next 3 months. Take medications as prescribed  Attend all scheduled provider appointments Call provider office for new concerns or questions        Patient verbalizes understanding of instructions provided today and agrees to view in [place].   Telephone follow up appointment with Galloway management team member scheduled for: 1 month.  [name]. Myrtie Neither, MSN, [facility] Gerontological Nurse Practitioner [facility] Galloway Management [phone]

## 2021-10-22 NOTE — Patient Outreach (Signed)
[facility]) Care Management Geriatric Nurse Practitioner Note   10/22/2021 Name:  Suzanne Galloway MRN:  [id number] DOB:  06-29-1941  Summary: Chronic illnesses stable: HF, CAD, HTN, GERD New problem memory loss  Recommendations/Changes made from today's visit: Discussed diagnostics and possible treatments.  Reinforced calling with any problem or question.  Subjective: Suzanne Galloway is an 81 y.o. year old female who is a primary patient of Orpah Melter, MD. The care management team was consulted for assistance with care management and/or care coordination needs.    Geriatric Nurse Practitioner completed Telephone Visit today.   Objective: N/A  Outpatient Encounter Medications as of 10/22/2021  Medication Sig Note   Ascorbic Acid (VITAMIN C PO) Take 1 tablet by mouth daily.    aspirin 81 MG chewable tablet Chew 81 mg by mouth in the morning and at bedtime.     Calcium Carbonate-Vitamin D 600-400 MG-UNIT tablet Take 1 tablet by mouth daily.    desvenlafaxine (PRISTIQ) 50 MG 24 hr tablet Take 50 mg by mouth daily.    famotidine (PEPCID) 40 MG tablet Take 40 mg by mouth 2 (two) times daily.    lacosamide ([name]) 50 MG TABS tablet TAKE ONE TABLET BY MOUTH TWICE DAILY    levothyroxine (SYNTHROID) 75 MCG tablet Take 75 mcg by mouth daily before breakfast.    loperamide (IMODIUM A-D) 2 MG tablet Take 2 mg by mouth 4 (four) times daily as needed for diarrhea or loose stools.    metoprolol succinate (TOPROL-XL) 25 MG 24 hr tablet Take 25 mg by mouth daily.    Multiple Vitamins-Minerals (PRESERVISION AREDS PO) Take 1 tablet by mouth daily.     pantoprazole (PROTONIX) 40 MG tablet Take 1 tablet (40 mg total) by mouth 2 (two) times daily before a meal.    Polyethyl Glycol-Propyl Glycol (SYSTANE OP) Apply 1 drop to eye daily as needed.    sacubitril-valsartan (ENTRESTO) 24-26 MG Take 1 tablet by mouth 2 (two) times daily.     spironolactone (ALDACTONE) 25 MG tablet Take 25 mg by  mouth daily.    sucralfate (CARAFATE) 1 g tablet Take 1 g by mouth 4 (four) times daily -  with meals and at bedtime.    zonisamide (ZONEGRAN) 100 MG capsule Take 2 capsules (200 mg total) by mouth at bedtime.    albuterol (VENTOLIN HFA) 108 (90 Base) MCG/ACT inhaler Inhale 1 puff into the lungs every 4 (four) hours. (Patient not taking: Reported on 10/22/2021)    nitroGLYCERIN (NITROSTAT) 0.4 MG SL tablet Place 0.4 mg under the tongue every 5 (five) minutes as needed for chest pain. (Patient not taking: Reported on 10/22/2021) 05/16/2020: Has not needed to take   polyethylene glycol powder (GLYCOLAX/MIRALAX) 17 GM/SCOOP powder Dissolve 1 capful in 8 ounces of fluid and drink daily (Patient not taking: Reported on 10/22/2021)    No facility-administered encounter medications on file as of 10/22/2021.   Care Plan  Review of patient past medical history, allergies, medications, health status, including review of consultants reports, laboratory and other test data, was performed as part of comprehensive evaluation for care management services.   Care Plan : RN Care Manager Plan of Care  Updates made by Deloria Lair, NP since 10/22/2021 12:00 AM     Problem: Depression   Priority: High  Onset Date: 09/18/2021     Long-Range Goal: Pt will report success (not helpful, helpful) of new treatment on each monthly call over the next 3 months  Start Date: 09/18/2021  Expected End Date: 12/19/2021  Priority: High  Note:    10/22/21:  (Status: Goal on Track (progressing): YES.) Long Term Goal  Reviewed medications with patient and discussed Current regimen Pristique 50 mg and pt feels she is getting a benefit -                     Successful Reviewed scheduled/upcoming provider appointments including neurology, psychology, PCP. Taking meds as prescribed. Reinforced calling NP or MD if has new problem or questions.  Current Barriers:  No patient barriers  RNCM Clinical Goal(s):  Patient will take all  medications exactly as prescribed and will call provider for medication related questions as evidenced by pt report.    attend all scheduled medical appointments:   as evidenced by pt report         Evidence of collaboration with RN Care manager, provider, and care team.   Interventions: Evaluation of current treatment plan related to  self management and patient's adherence to plan as established by provider  Patient Goals/Self-Care Activities: as evidenced by pt on each monthly encounter. Take medications as prescribed   Attend all scheduled provider appointments Call provider office for new concerns or questions       Problem: Multiple co-morbidites   Priority: Medium  Onset Date: 09/18/2021     Long-Range Goal: Pt will report changes in health status at onset of sx as evidenced by pt report and chart review over the next 3 months.   Start Date: 09/18/2021  Priority: Medium  Note:    10/22/21:  (Status: Goal on Track (progressing): YES.) Long Term Goal  Evaluation of current treatment plan related to chronic illnesses and patient's adherence to plan as established by provider Provided education to patient re: Diagnositc tests ordered by neurology for memory loss and potential treatment. Reviewed medications with patient and discussed potential new medication for memory loss which may be ordered by neurology.   Current Barriers:  None  RNCM Clinical Goal(s):  Patient will demonstrate ongoing health management independence as evidenced by pt report each month over the next 3 months.        work with NP, PCP to address new and chronic illness problems as evidenced by pt report and chart review.   Interventions: Inter-disciplinary care team collaboration (see longitudinal plan of care) Evaluation of current treatment plan related to  self management and patient's adherence to plan as established by provider  Patient Goals/Self-Care Activities: As evidenced by pt reporting and  chart review each month for the next 3 months. Take medications as prescribed   Attend all scheduled provider appointments Call provider office for new concerns or questions         Plan: Telephone follow up appointment with care management team member scheduled for:  1 month. Pt agrees to plan of care.  [name]. Myrtie Neither, MSN, [facility] Gerontological Nurse Practitioner [facility] Care Management [phone]

## 2021-10-24 ENCOUNTER — Encounter: Payer: Self-pay | Admitting: Psychology

## 2021-10-27 ENCOUNTER — Ambulatory Visit: Payer: Medicare Other | Admitting: Diagnostic Neuroimaging

## 2021-10-27 DIAGNOSIS — R413 Other amnesia: Secondary | ICD-10-CM

## 2021-10-27 DIAGNOSIS — G40109 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, not intractable, without status epilepticus: Secondary | ICD-10-CM

## 2021-11-06 ENCOUNTER — Telehealth: Payer: Self-pay | Admitting: Family Medicine

## 2021-11-06 NOTE — Telephone Encounter (Signed)
Pt inquiring about EEG results, have not heard from anyone. Would like a call from the nurse.

## 2021-11-06 NOTE — Telephone Encounter (Signed)
As of today, EEG results are not yet posted. I will send to Dr Leta Baptist to review and once this has been reviewed, we will call and review with the patient.

## 2021-11-07 ENCOUNTER — Ambulatory Visit
Admission: RE | Admit: 2021-11-07 | Discharge: 2021-11-07 | Disposition: A | Discharge status: Home / Self Care | Payer: Medicare Other | Source: Ambulatory Visit | Attending: Family Medicine | Admitting: Family Medicine

## 2021-11-07 DIAGNOSIS — Z1231 Encounter for screening mammogram for malignant neoplasm of breast: Secondary | ICD-10-CM | POA: Diagnosis not present

## 2021-11-07 NOTE — Procedures (Signed)
° °  [facility]  EEG (ELECTROENCEPHALOGRAM) REPORT   STUDY DATE: 10/27/21 PATIENT NAME: Suzanne Galloway DOB: [birth year] MRN: [id number]  ORDERING CLINICIAN: Andrey Spearman, MD   TECHNOLOGIST: Myer Peer TECHNIQUE: Electroencephalogram was recorded utilizing standard 10-20 system of lead placement and reformatted into average and bipolar montages.  RECORDING TIME: 23 minutes  ACTIVATION: hyperventilation and photic stimulation  CLINICAL INFORMATION: 81 year old female with seizures.  FINDINGS: Posterior dominant background rhythms, which attenuate with eye opening, ranging 11-12 hertz and 20-30 microvolts. No focal, lateralizing, epileptiform activity or seizures are seen. Patient recorded in the awake and drowsy state. EKG channel shows regular rhythm of 70-75 beats per minute.   IMPRESSION:   Normal EEG in the awake and drowsy states.   INTERPRETING PHYSICIAN:  Penni Bombard, MD Certified in Neurology, Neurophysiology and Neuroimaging  [facility] [address], [place], [place] [address] [phone]

## 2021-11-10 ENCOUNTER — Encounter: Payer: Self-pay | Admitting: Neurology

## 2021-11-20 DIAGNOSIS — D3001 Benign neoplasm of right kidney: Secondary | ICD-10-CM | POA: Diagnosis not present

## 2021-11-21 ENCOUNTER — Other Ambulatory Visit: Payer: Self-pay | Admitting: *Deleted

## 2021-11-21 DIAGNOSIS — M791 Myalgia, unspecified site: Secondary | ICD-10-CM | POA: Diagnosis not present

## 2021-11-21 DIAGNOSIS — S161XXA Strain of muscle, fascia and tendon at neck level, initial encounter: Secondary | ICD-10-CM | POA: Diagnosis not present

## 2021-11-21 NOTE — Patient Instructions (Signed)
Visit Information  Thank you for taking time to visit with me today. Please don't hesitate to contact me if I can be of assistance to you before our next scheduled telephone appointment.  Following are the goals we discussed today:  (Copy and paste patient goals from clinical care plan here)  Our next appointment is by telephone on Wednesday, March 8th in the am.  Following is a copy of your care plan:  Care Plan : [place] of Care  Updates made by Deloria Lair, NP since 11/21/2021 12:00 AM     Problem: Depression   Priority: High  Onset Date: 09/18/2021     Goal: Pt will report success (not helpful, helpful) of new treatment on each monthly call over the next 3 months Completed 11/21/2021  Start Date: 09/18/2021  Expected End Date: 12/19/2021  Priority: High  Note:    Update 11/21/21:  (Status: Goal Met.) Short Term Goal  Evaluation of current treatment plan related to Depression and patient's adherence to plan as established by provider Advised patient to continue her current regimen (Desvenlafaxine 50 mg daily) Screening for signs and symptoms of depression related to chronic disease state              PQR2 Score 0/2!  10/22/21:  (Status: Goal on Track (progressing): YES.) Long Term Goal  Reviewed medications with patient and discussed Current regimen Pristique 50 mg and pt feels she is getting a benefit -                     Successful Reviewed scheduled/upcoming provider appointments including neurology, psychology, PCP. Taking meds as prescribed. Reinforced calling NP or MD if has new problem or questions.  Current Barriers:  No patient barriers  RNCM Clinical Goal(s):  Patient will take all medications exactly as prescribed and will call provider for medication related questions as evidenced by pt report.    attend all scheduled medical appointments:   as evidenced by pt report         Evidence of collaboration with RN Care manager, provider, and care team.    Interventions: Evaluation of current treatment plan related to  self management and patient's adherence to plan as established by provider  Patient Goals/Self-Care Activities: as evidenced by pt on each monthly encounter. Take medications as prescribed   Attend all scheduled provider appointments Call provider office for new concerns or questions       Problem: Multiple co-morbidites   Priority: Medium  Onset Date: 09/18/2021     Long-Range Goal: Pt will report changes in health status at onset of sx as evidenced by pt report and chart review over the next 3 months.   Start Date: 09/18/2021  Expected End Date: 12/24/2021  Priority: High  Note:    Update 11/21/21  (Status: Goal on Track (progressing): YES.) Long Term Goal  Generally feeling well. Monitoring BP daily today 137/54 taking  metoprolol 25 mg daily. HF - weight is stable 129, no edema or SOB. Following low salt diet. Taking Entresto and aldactone. CAD - no CP, Following Low Fat diet. Has not needed any NTG. GERD controlled with pantoprazole and carafate. Encouragement to continue current self management and medical regimen. Report any signs of exacerbation to MD or NP.   10/22/21:  (Status: Goal on Track (progressing): YES.) Long Term Goal  Evaluation of current treatment plan related to chronic illnesses and patient's adherence to plan as established by provider Provided education to patient  re: Diagnositc tests ordered by neurology for memory loss and potential treatment. Reviewed medications with patient and discussed potential new medication for memory loss which may be ordered by neurology.   Current Barriers:  None  RNCM Clinical Goal(s):  Patient will demonstrate ongoing health management independence as evidenced by pt report each month over the next 3 months.        work with NP, PCP to address new and chronic illness problems as evidenced by pt report and chart review.   Interventions: Inter-disciplinary  care team collaboration (see longitudinal plan of care) Evaluation of current treatment plan related to  self management and patient's adherence to plan as established by provider  Patient Goals/Self-Care Activities: As evidenced by pt reporting and chart review each month for the next 3 months. Take medications as prescribed   Attend all scheduled provider appointments Call provider office for new concerns or questions       Problem: Memory problems   Priority: Medium  Onset Date: 11/21/2021     Long-Range Goal: Patient will exhibit improved memory over the next 3 months as evidenced by improvement on the [facility] test score from current 19/24 (Spacial and naming words beginning with "F" were not included)   Start Date: 11/21/2021  Expected End Date: 02/27/2022  Priority: Medium  Note:   Current Barriers:  Chronic Disease Management support and education needs related to Memory Impairment  RNCM Clinical Goal(s):  Patient will verbalize understanding of plan for management of Memory Impairment as evidenced by patient report and chart review. take all medications exactly as prescribed and will call provider for medication related questions as evidenced by patient report.    Complete memory exercises suggested by and  through collaboration with RN Care manager, provider, and care team.   Interventions: Inter-disciplinary care team collaboration (see longitudinal plan of care) Evaluation of current treatment plan related to  self management and patient's adherence to plan as established by provider               Administered [facility] telephonically minus the spatial questions (5 points) and naming words starting with 'F" (1 point)               Score was 19/24. When administering the entire tool (30 questions) a score of 26-30 is considered normal. This score would                be considered abnormal but only slightly so. The areas of deficit are in ATTENTION and DELAYED RECALL.       Verfiied  upcoming neuropsychiatry appts this month.       Suggested memory exercises and mindfulness. Keep a calendar for appts.  Patient Goals/Self-Care Activities: Take medications as prescribed   Attend all scheduled provider appointments Call pharmacy for medication refills 3-7 days in advance of running out of medications Attend church or other social activities       The patient verbalized understanding of instructions, educational materials, and care plan provided today and agreed to receive a mailed copy of patient instructions, educational materials, and care plan.   Telephone follow up appointment with care management team member scheduled for: 12/24/21.  [name]. Myrtie Neither, MSN, [facility] Gerontological Nurse Practitioner [facility] Care Management [phone]
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
## 2021-12-16 ENCOUNTER — Other Ambulatory Visit: Payer: Self-pay

## 2021-12-16 ENCOUNTER — Ambulatory Visit (INDEPENDENT_AMBULATORY_CARE_PROVIDER_SITE_OTHER): Payer: Medicare Other | Admitting: Psychology

## 2021-12-16 ENCOUNTER — Ambulatory Visit: Payer: Medicare Other | Admitting: Psychology

## 2021-12-16 ENCOUNTER — Encounter: Payer: Self-pay | Admitting: Psychology

## 2021-12-16 DIAGNOSIS — I6781 Acute cerebrovascular insufficiency: Secondary | ICD-10-CM

## 2021-12-16 DIAGNOSIS — N3281 Overactive bladder: Secondary | ICD-10-CM | POA: Insufficient documentation

## 2021-12-16 DIAGNOSIS — Z634 Disappearance and death of family member: Secondary | ICD-10-CM

## 2021-12-16 DIAGNOSIS — E7800 Pure hypercholesterolemia, unspecified: Secondary | ICD-10-CM

## 2021-12-16 DIAGNOSIS — F4321 Adjustment disorder with depressed mood: Secondary | ICD-10-CM | POA: Diagnosis not present

## 2021-12-16 DIAGNOSIS — K2940 Chronic atrophic gastritis without bleeding: Secondary | ICD-10-CM | POA: Insufficient documentation

## 2021-12-16 DIAGNOSIS — H9190 Unspecified hearing loss, unspecified ear: Secondary | ICD-10-CM | POA: Insufficient documentation

## 2021-12-16 DIAGNOSIS — J45909 Unspecified asthma, uncomplicated: Secondary | ICD-10-CM | POA: Insufficient documentation

## 2021-12-16 DIAGNOSIS — M19049 Primary osteoarthritis, unspecified hand: Secondary | ICD-10-CM

## 2021-12-16 DIAGNOSIS — R4189 Other symptoms and signs involving cognitive functions and awareness: Secondary | ICD-10-CM

## 2021-12-16 DIAGNOSIS — R1310 Dysphagia, unspecified: Secondary | ICD-10-CM | POA: Insufficient documentation

## 2021-12-16 DIAGNOSIS — R7301 Impaired fasting glucose: Secondary | ICD-10-CM | POA: Insufficient documentation

## 2021-12-16 HISTORY — DX: Primary osteoarthritis, unspecified hand: M19.049

## 2021-12-16 NOTE — Progress Notes (Signed)
° °  Psychometrician Note   Cognitive testing was administered to Suzanne Galloway by Milana Kidney, B.S. (psychometrist) under the supervision of Dr. Christia Reading, Ph.D., licensed psychologist on 12/16/21. Suzanne Galloway did not appear overtly distressed by the testing session per behavioral observation or responses across self-report questionnaires. Rest breaks were offered.    The battery of tests administered was selected by Dr. Christia Reading, Ph.D. with consideration to Suzanne Galloway's current level of functioning, the nature of her symptoms, emotional and behavioral responses during interview, level of literacy, observed level of motivation/effort, and the nature of the referral question. This battery was communicated to the psychometrist. Communication between Dr. Christia Reading, Ph.D. and the psychometrist was ongoing throughout the evaluation and Dr. Christia Reading, Ph.D. was immediately accessible at all times. Dr. Christia Reading, Ph.D. provided supervision to the psychometrist on the date of this service to the extent necessary to assure the quality of all services provided.    Suzanne Galloway will return within approximately 1-2 weeks for an interactive feedback session with Dr. Melvyn Novas at which time her test performances, clinical impressions, and treatment recommendations will be reviewed in detail. Suzanne Galloway understands she can contact our office should she require our assistance before this time.  A total of 130 minutes of billable time were spent face-to-face with Suzanne Galloway by the psychometrist. This includes both test administration and scoring time. Billing for these services is reflected in the clinical report generated by Dr. Christia Reading, Ph.D.  This note reflects time spent with the psychometrician and does not include test scores or any clinical interpretations made by Dr. Melvyn Novas. The full report will follow in a separate note.

## 2021-12-16 NOTE — Progress Notes (Signed)
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 101 secs.,  0 drops  34 Average    Non-Dominant Hand 140 secs.,  0 drops  9 Below Average       Mood and Personality:      Raw Score Percentile   Geriatric Depression Scale: 2 --- Within Normal Limits  Geriatric Anxiety Scale: 4 --- Minimal    Somatic 3 --- Minimal    Cognitive 1 --- Minimal    Affective 0 --- Minimal       Additional Questionnaires:      Raw Score Percentile   PROMIS Sleep Disturbance Questionnaire: 19 --- None to Slight   Informed Consent and Coding/Compliance:   The current evaluation represents a clinical evaluation for the purposes previously outlined by the referral source and is in no way reflective of a forensic evaluation.   Suzanne Galloway. Carreno was provided with a verbal description of the nature and purpose of the present neuropsychological evaluation. Also reviewed were the foreseeable risks and/or discomforts and benefits of the procedure, limits of  confidentiality, and mandatory reporting requirements of this provider. The patient was given the opportunity to ask questions and receive answers about the evaluation. Oral consent to participate was  provided by the patient.   This evaluation was conducted by Christia Reading, Ph.D., ABPP-CN, board certified clinical neuropsychologist. Suzanne Galloway. Lennox completed a clinical interview with Dr. Melvyn Novas, billed as one unit [phone], and 130 minutes of cognitive testing and scoring, billed as one unit [phone] and three additional units [id number]. Psychometrist Milana Kidney, B.S., assisted Dr. Melvyn Novas with test administration and scoring procedures. As a separate and discrete service, Dr. Melvyn Novas spent a total of 160 minutes in interpretation and report writing billed as one unit [phone] and two units [id number].

## 2021-12-17 DIAGNOSIS — F4321 Adjustment disorder with depressed mood: Secondary | ICD-10-CM | POA: Insufficient documentation

## 2021-12-23 ENCOUNTER — Ambulatory Visit (INDEPENDENT_AMBULATORY_CARE_PROVIDER_SITE_OTHER): Payer: Medicare Other | Admitting: Psychology

## 2021-12-23 ENCOUNTER — Other Ambulatory Visit: Payer: Self-pay

## 2021-12-23 DIAGNOSIS — R310 Gross hematuria: Secondary | ICD-10-CM | POA: Diagnosis not present

## 2021-12-23 DIAGNOSIS — D3000 Benign neoplasm of unspecified kidney: Secondary | ICD-10-CM | POA: Diagnosis not present

## 2021-12-23 DIAGNOSIS — I700 Atherosclerosis of aorta: Secondary | ICD-10-CM | POA: Diagnosis not present

## 2021-12-23 DIAGNOSIS — R4189 Other symptoms and signs involving cognitive functions and awareness: Secondary | ICD-10-CM

## 2021-12-23 DIAGNOSIS — F4321 Adjustment disorder with depressed mood: Secondary | ICD-10-CM | POA: Diagnosis not present

## 2021-12-23 DIAGNOSIS — I6781 Acute cerebrovascular insufficiency: Secondary | ICD-10-CM

## 2021-12-23 DIAGNOSIS — R319 Hematuria, unspecified: Secondary | ICD-10-CM | POA: Diagnosis not present

## 2021-12-23 NOTE — Progress Notes (Signed)
? ?  Neuropsychology Feedback Session ?[facility] ?[place] Department of Neurology ? ?Reason for Referral:  ? ?Suzanne Galloway is a 81 y.o. right-handed Caucasian female referred by  Suzanne Presto, NP , to characterize her current cognitive functioning and assist with diagnostic clarity and treatment planning in the context of subjective cognitive decline.  ? ?Feedback:  ? ?Suzanne Galloway completed a comprehensive neuropsychological evaluation on 12/16/2021. Please refer to that encounter for the full report and recommendations. Briefly, results suggested neuropsychological functioning largely within normal limits relative to age-matched peers. She exhibited deficits across two isolated tasks. These included response inhibition and visuoconstructional abilities when copying a complex figure. However, all other tasks assessing executive functioning and visuospatial/constructional abilities were appropriate. No consistent deficits emerged and I do not believe that current scores arise to where a diagnosis surrounding a formal cognitive disorder is warranted. Current results are not localizing or lateralizing relative to potential seizure activity in her medical history. There could be a mild vascular component to her current presentation given that neuroimaging has shown moderate microvascular ischemic changes which do seem to have progressed over time. Medication side effects, particularly those related to anti-epileptic drugs (AEDs) can also negatively impact cognitive abilities, often to a mild extent. Additionally, mild distress stemming from ongoing bereavement surrounding several individuals and their passing could also be directly influencing her abilities both across testing and in her day-to-day life.  ? ?Suzanne Galloway was accompanied by her daughter during the current feedback session. Content of the current session focused on the results of her neuropsychological evaluation. Suzanne Galloway was given the opportunity  to ask questions and her questions were answered. She was encouraged to reach out should additional questions arise. A copy of her report was provided at the conclusion of the visit.  ? ?  ? ?20 minutes were spent conducting the current feedback session with Suzanne Galloway, billed as one unit [phone].  ?

## 2021-12-24 ENCOUNTER — Other Ambulatory Visit: Payer: Self-pay | Admitting: *Deleted

## 2021-12-24 DIAGNOSIS — N390 Urinary tract infection, site not specified: Secondary | ICD-10-CM | POA: Diagnosis not present

## 2021-12-24 DIAGNOSIS — R319 Hematuria, unspecified: Secondary | ICD-10-CM | POA: Diagnosis not present

## 2021-12-24 NOTE — Patient Outreach (Addendum)
11/26/21 ? ?Suzanne Galloway ?DOB: [birth year] ? ?Telephone outreach for our regularly scheduled monthly check up. Today, [name] informs me she has had frank hematuria since Sunday., L flank pain that radiates around and eventually goes to her groin. She called her urologist, Dr. Louis Meckel, and was able to see him yesterday. He did a renal panel pre CT, but not a CBC or Hgb.He did a CT which according to Kierria did not show any significant changes on the previously identified lesions. He has said he would schedule other tests and the office would call to let her know when that would be. ? ?NP called [facility] and was told that he has ordered a cystoscopy ASAP. Verified that a CBC or Hgb was NOT done. ? ?Called Dr. Leitha Schuller to get an appointment there so she can be evaluated and have a CBC. Office is closed currently and will reopen at 1:00. NP to call back at that time. NP updated pt on progress of plan and that she needs to see Dr. Olen Pel or other provider today. ? ?[name]. Austin Herd, MSN, GNP-BC ?Gerontological Nurse Practitioner ?[facility] Care Management ?[phone] ? ?1:00 - Called Dr. Olen Pel office reported pt issues and an appt was make for 3:30 today with Patience Musca, NP. ?Called and advised pt of the appt. She voiced her appreciation. She agreed to call me back after the appt. ? ?[name]. Yazid Pop, MSN, GNP-BC ?Gerontological Nurse Practitioner ?[facility] Care Management ?[phone] ? ?4:15 pm Pt texted after her appt. She did have blood work and another urinalysis done. Her BP was lower than usual for her 108/58. Advised to put into practice fall precautions, especially when going from sitting to standing and to stand for a whole minute before walking and to sit immediately if she becomes dizzy. Reinforced drinking plenty of fluids. Advised to call MD or NP if continued or new problems and to go to the ED if she has severe abdominal pain. Requested a call from pt when she gets scheduled for her cystoscopy and gets  results from blood work. ? ?[name]. Norvel Wenker, MSN, GNP-BC ?Gerontological Nurse Practitioner ?[facility] Care Management ?[phone] ? ? ? ?

## 2021-12-26 ENCOUNTER — Ambulatory Visit: Payer: Medicare Other | Admitting: Nurse Practitioner

## 2021-12-30 DIAGNOSIS — D649 Anemia, unspecified: Secondary | ICD-10-CM | POA: Diagnosis not present

## 2022-01-05 DIAGNOSIS — R059 Cough, unspecified: Secondary | ICD-10-CM | POA: Diagnosis not present

## 2022-01-09 ENCOUNTER — Telehealth: Payer: Self-pay | Admitting: Internal Medicine

## 2022-01-09 DIAGNOSIS — E785 Hyperlipidemia, unspecified: Secondary | ICD-10-CM

## 2022-01-09 DIAGNOSIS — E039 Hypothyroidism, unspecified: Secondary | ICD-10-CM

## 2022-01-09 NOTE — Telephone Encounter (Signed)
Patient has an upcoming lab appointment , no orders . Please place orders     Tammy [name]  [facility] II    PCN

## 2022-01-10 ENCOUNTER — Other Ambulatory Visit: Payer: Self-pay | Admitting: Neurology

## 2022-01-10 DIAGNOSIS — G40109 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, not intractable, without status epilepticus: Secondary | ICD-10-CM

## 2022-01-10 MED ORDER — LACOSAMIDE 50 MG PO TABS: ORAL_TABLET | ORAL | 1 refills | Status: DC

## 2022-01-10 NOTE — Progress Notes (Signed)
Patient called and wanted her [name] send to [facility].  ?

## 2022-01-13 NOTE — Telephone Encounter (Signed)
Labs ordered.

## 2022-01-19 DIAGNOSIS — R310 Gross hematuria: Secondary | ICD-10-CM | POA: Diagnosis not present

## 2022-01-20 ENCOUNTER — Ambulatory Visit: Payer: Medicare Other | Admitting: Internal Medicine

## 2022-01-20 ENCOUNTER — Ambulatory Visit: Payer: Medicare Other | Attending: Internal Medicine

## 2022-01-20 DIAGNOSIS — E785 Hyperlipidemia, unspecified: Secondary | ICD-10-CM | POA: Insufficient documentation

## 2022-01-20 DIAGNOSIS — E039 Hypothyroidism, unspecified: Secondary | ICD-10-CM | POA: Insufficient documentation

## 2022-01-20 LAB — COMPREHENSIVE METABOLIC PANEL
Alanine Transferase (ALT): 14 U/L (ref <=33)
Albumin: 4 g/dL (ref 4.0–4.9)
Alkaline Phosphatase (ALP): 64 U/L (ref 35–129)
Anion Gap: 9 mmol/L (ref 7–15)
Aspartate Transaminase (AST): 20 U/L (ref <=41)
Bilirubin Total: 0.9 mg/dL (ref <=1.2)
Calcium: 9 mg/dL (ref 8.6–10.0)
Carbon Dioxide Total: 27 mmol/L (ref 22–29)
Chloride: 107 mmol/L (ref 98–107)
Creatinine Serum: 0.76 mg/dL (ref 0.51–1.17)
E-GFR Creatinine (Female): 80 mL/min/{1.73_m2}
Glucose: 99 mg/dL (ref 74–109)
Potassium: 4 mmol/L (ref 3.4–5.1)
Protein: 5.9 g/dL — ABNORMAL LOW (ref 6.6–8.7)
Sodium: 143 mmol/L (ref 136–145)
Urea Nitrogen, Blood (BUN): 13 mg/dL (ref 6–20)

## 2022-01-20 LAB — LIPID PANEL
Cholesterol: 178 mg/dL (ref <200)
HDL Cholesterol: 68 mg/dL (ref >40)
LDL Cholesterol Calculation: 92 mg/dL (ref <100)
Non-HDL Cholesterol: 110 mg/dL (ref <=150)
Total Cholesterol: HDL Ratio: 2.6 (ref <4.0)
Triglyceride: 92 mg/dL (ref <150)

## 2022-01-20 LAB — CBC WITH DIFFERENTIAL
Basophils % Auto: 0.8 %
Basophils Abs Auto: 0 10*3/uL (ref 0.0–0.2)
Eosinophils % Auto: 2.9 %
Eosinophils Abs Auto: 0.2 10*3/uL (ref 0.0–0.5)
Hematocrit: 42.2 % (ref 36.0–46.0)
Hemoglobin: 13.9 g/dL (ref 12.0–16.0)
Lymphocytes % Auto: 28 %
Lymphocytes Abs Auto: 1.6 10*3/uL (ref 1.0–4.8)
MCH: 30.2 pg (ref 27.0–33.0)
MCHC: 33 % (ref 32.0–36.0)
MCV: 91.5 fL (ref 80.0–100.0)
MPV: 7.9 fL (ref 6.8–10.0)
Monocytes % Auto: 7.8 %
Monocytes Abs Auto: 0.4 10*3/uL (ref 0.1–0.8)
Neutrophils % Auto: 60.5 %
Neutrophils Abs Auto: 3.5 10*3/uL (ref 1.8–7.7)
Platelet Count: 260 10*3/uL (ref 130–400)
RDW: 14.1 % (ref 0.0–14.7)
Red Blood Cell Count: 4.61 10*6/uL (ref 4.00–5.20)
White Blood Cell Count: 5.7 10*3/uL (ref 4.5–11.0)

## 2022-01-20 LAB — THYROID STIMULATING HORMONE: Thyroid Stimulating Hormone: 1.14 u[IU]/mL (ref 0.27–4.20)

## 2022-01-21 IMAGING — MG DIGITAL SCREENING BILAT W/ TOMO W/ CAD
6 of 10 series · 6 of 30 positions shown · non-contrast
Comparison: Previous exam(s).

CLINICAL DATA: Screening.

EXAM:
DIGITAL SCREENING BILATERAL MAMMOGRAM WITH TOMO AND CAD

[L MLO synth-2D (1 of 2)]
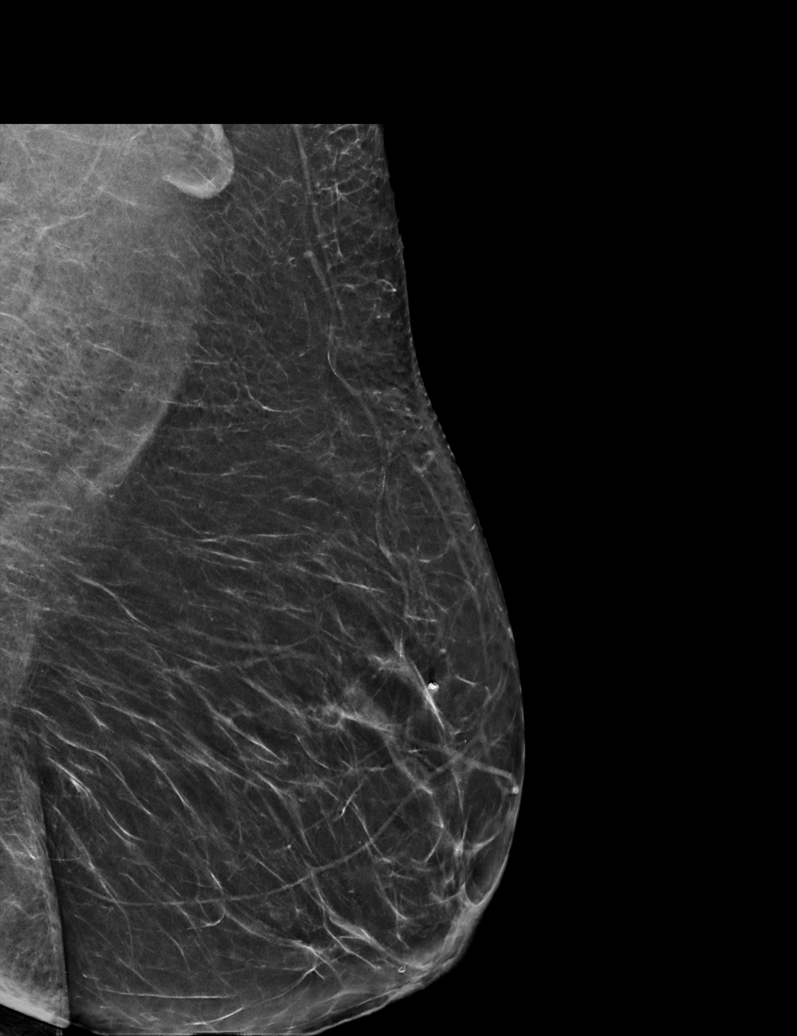

[R CC synth-2D]
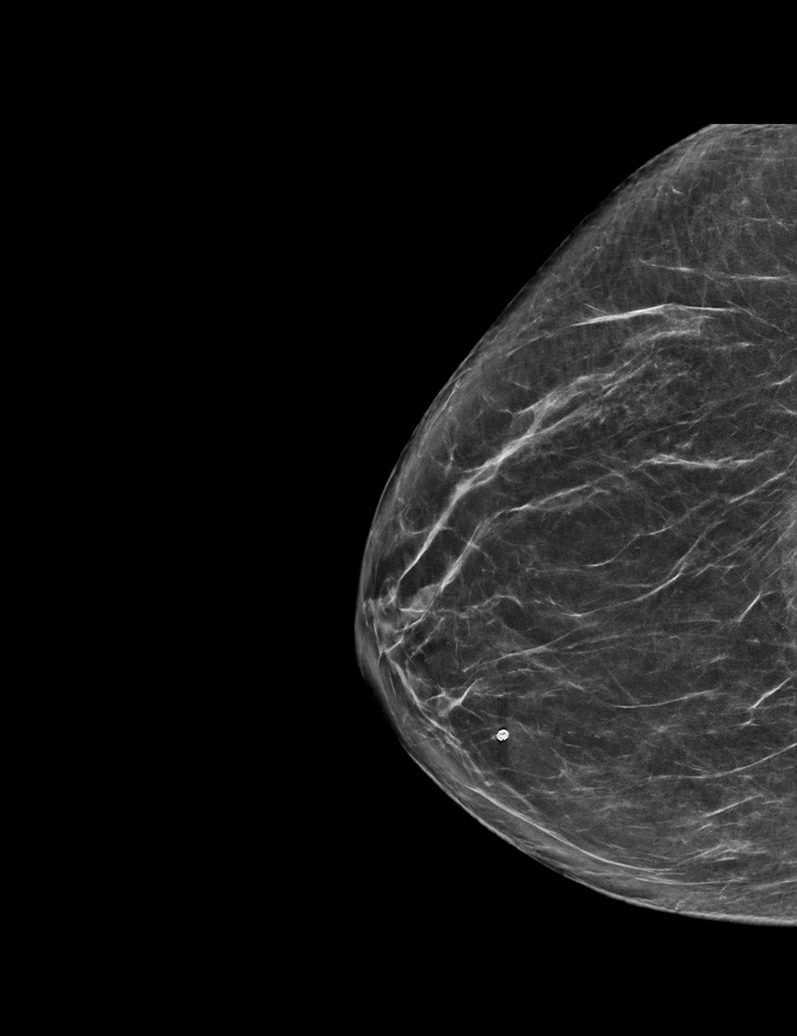

[L CC synth-2D]
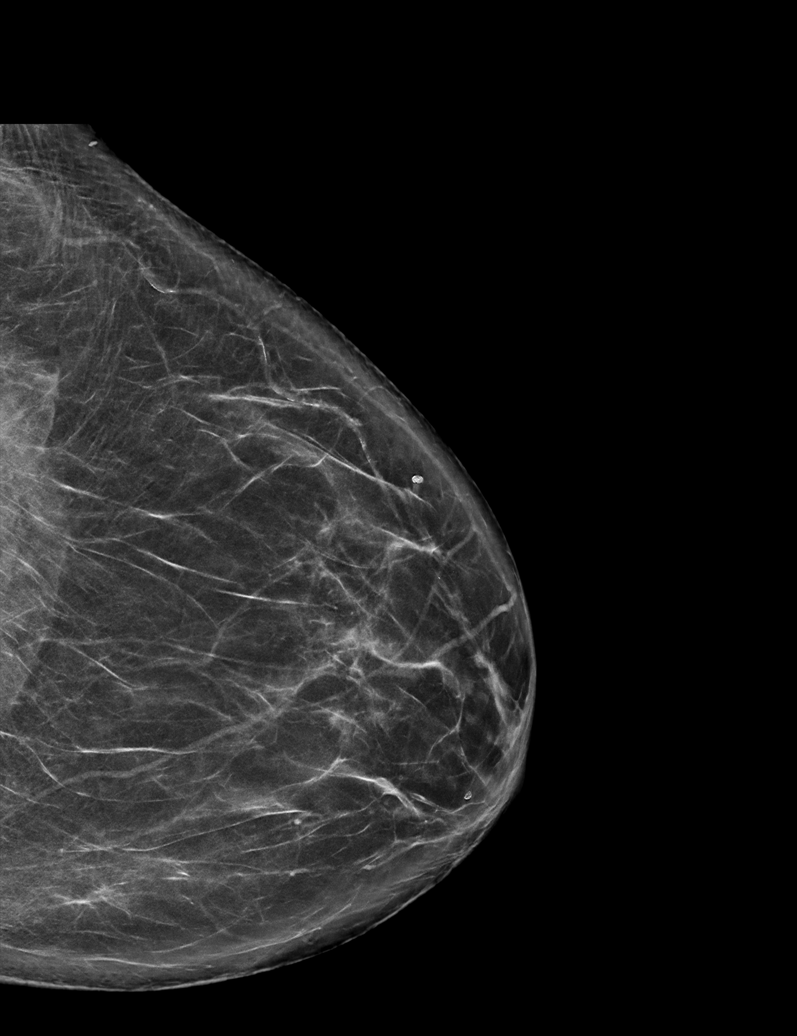

[L MLO synth-2D (2 of 2)]
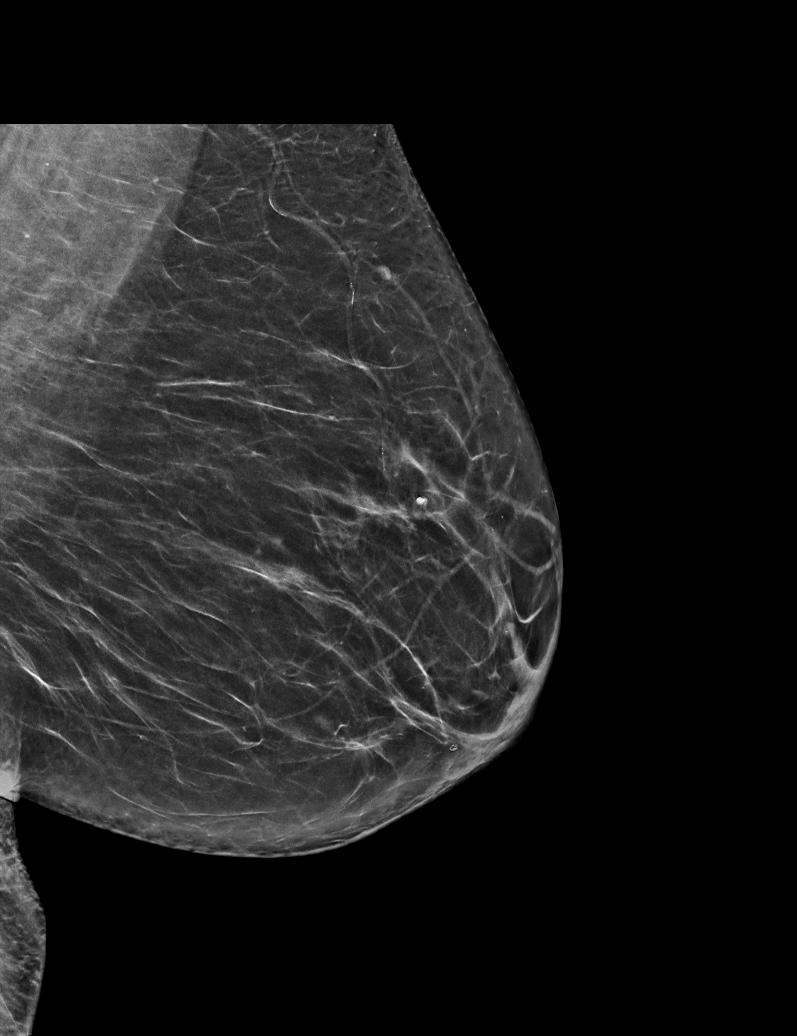

[R MLO synth-2D]
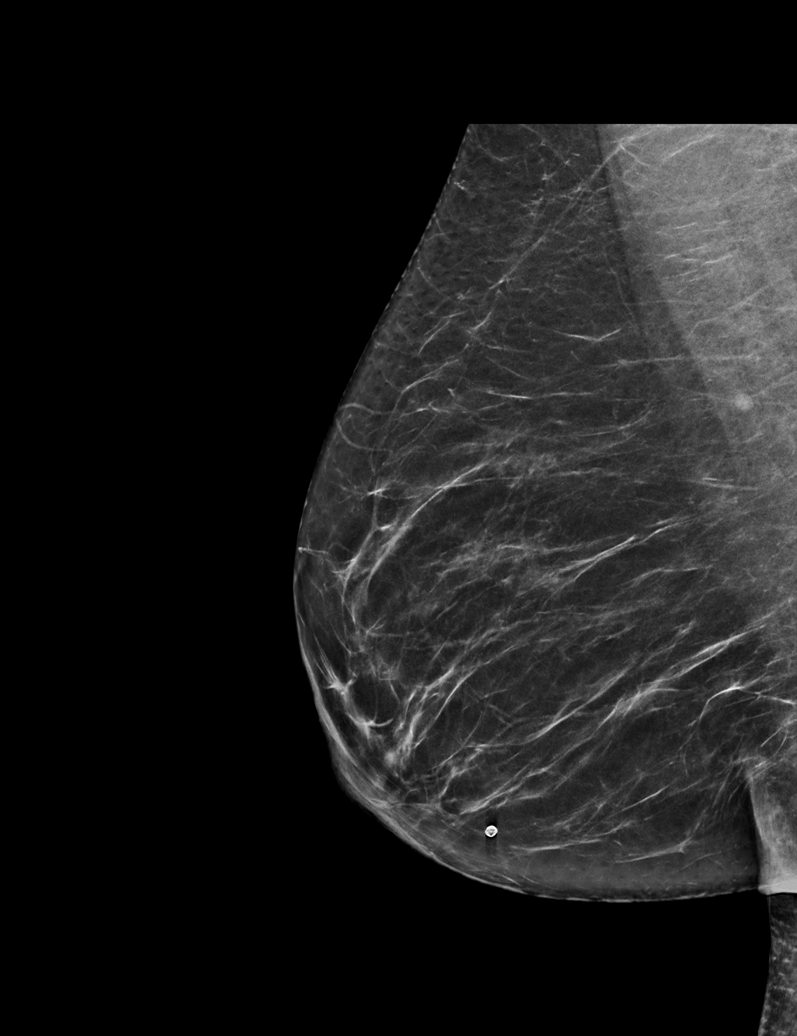

[L MLO tomo · tomo slice 34/67.0]
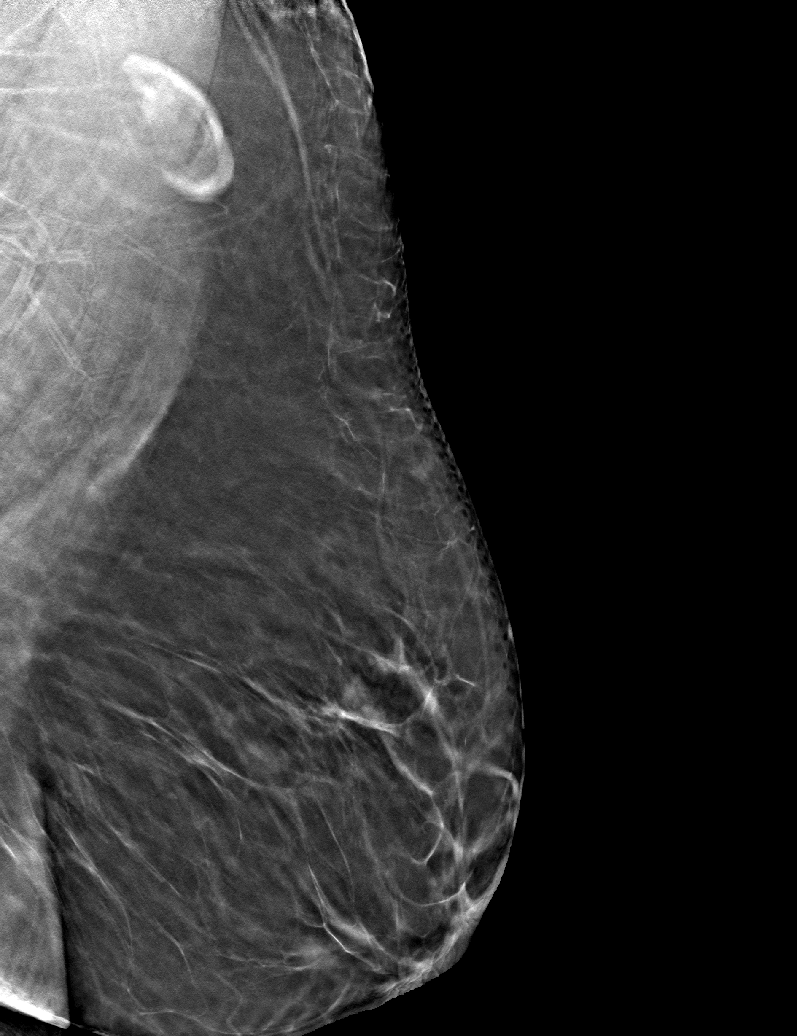

[6 of 30 positions shown; findings below may reference images not displayed]

ACR Breast Density Category b: There are scattered areas of
fibroglandular density.
FINDINGS: There are no findings suspicious for malignancy. Images were
processed with CAD.
IMPRESSION: No mammographic evidence of malignancy. A result letter of this
screening mammogram will be mailed directly to the patient.

RECOMMENDATION:
Screening mammogram in one year. (Code:[id number])

BI-RADS CATEGORY  1: Negative.

## 2022-01-22 ENCOUNTER — Other Ambulatory Visit: Payer: Self-pay | Admitting: *Deleted

## 2022-01-22 NOTE — Patient Outreach (Signed)
[facility]) Care Management ?Geriatric Nurse Practitioner Note ? ? ?01/22/2022 ?Name:  Suzanne Galloway MRN:  [id number] DOB:  Feb 14, 1941 ? ?Summary: ?Stable no acute problems at present. Hematuria resolved. Iron def. Anemia new dx -taking ferrous sulfate. ? ?Recommendations/Changes made from today's visit: ?Establish relationship with her new care manager, Clotilde Dieter at her PCP office. ? ?Subjective: ?Suzanne Galloway is an 81 y.o. year old female who is a primary patient of Orpah Melter, MD. The care management team was consulted for assistance with care management and/or care coordination needs.   ? ?NP has know Suzanne Galloway for 25 years. NP cared for her father, her husband and now Suzanne Galloway for about 10 years. She is a very dear lady and a pleasure to care for. ? ?Geriatric Nurse Practitioner completed Telephone Visit today.  ? ?Patient Active Problem List  ? Diagnosis Date Noted  ? 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  Priority: High  ? Adjustment disorder with depressed mood 12/17/2021  ? Atrophic gastritis   ? Dysphagia   ? Hearing loss   ? Impaired fasting glucose   ? Localized, primary osteoarthritis of hand   ? Overactive bladder   ? Asthma, uncomplicated   ? Atypical chest pain 03/20/2020  ? Ischemic cardiomyopathy 03/10/2020  ? Status post insertion of drug eluting coronary artery stent 03/10/2020  ? Coronary artery disease involving native coronary artery of native heart without angina pectoris 03/10/2020  ? Myocardial infarction 03/09/2020  ? Greater trochanteric pain syndrome 11/12/2017  ? Hypothyroidism 02/20/2017  ? GERD (gastroesophageal reflux disease) 01/01/2015  ? Mixed hyperlipidemia 01/01/2015  ? Essential hypertension 01/01/2015  ? Bereavement due to life event   ? Exertional shortness of breath 12/28/2014  ? Mitral valve prolapse 12/28/2014  ? ?Outpatient  Encounter Medications as of 01/22/2022  ?Medication Sig Note  ? albuterol (VENTOLIN HFA) 108 (90 Base) MCG/ACT inhaler Inhale 1 puff into the lungs every 4 (four) hours. (Patient not taking: Reported on 10/22/2021)   ? Ascorbic Acid (VITAMIN C PO) Take 1 tablet by mouth daily.   ? aspirin 81 MG chewable tablet Chew 81 mg by mouth in the morning and at bedtime.    ? Calcium Carbonate-Vitamin D 600-400 MG-UNIT tablet Take 1 tablet by mouth daily.   ? desvenlafaxine (PRISTIQ) 50 MG 24 hr tablet Take 50 mg by mouth daily.   ? famotidine (PEPCID) 40 MG tablet Take 40 mg by mouth 2 (two) times daily.   ? lacosamide ([name]) 50 MG TABS tablet TAKE ONE TABLET BY MOUTH TWICE DAILY   ? levothyroxine (SYNTHROID) 75 MCG tablet Take 75 mcg by mouth daily before breakfast.   ? loperamide (IMODIUM A-D) 2 MG tablet Take 2 mg by mouth 4 (four) times daily as needed for diarrhea or loose stools.   ? metoprolol succinate (TOPROL-XL) 25 MG 24 hr tablet Take 25 mg by mouth daily.   ? Multiple Vitamins-Minerals (PRESERVISION AREDS PO) Take 1 tablet by mouth daily.    ? nitroGLYCERIN (NITROSTAT) 0.4 MG SL tablet Place 0.4 mg under the tongue every 5 (five) minutes as needed for chest pain. (Patient not taking: Reported on 10/22/2021) 05/16/2020: Has not needed to take  ? pantoprazole (PROTONIX) 40 MG tablet Take 1 tablet (40 mg total) by mouth 2 (two) times daily before a meal.   ? Polyethyl Glycol-Propyl Glycol (SYSTANE OP) Apply 1 drop to eye daily as needed.   ?  polyethylene glycol powder (GLYCOLAX/MIRALAX) 17 GM/SCOOP powder Dissolve 1 capful in 8 ounces of fluid and drink daily (Patient not taking: Reported on 10/22/2021)   ? sacubitril-valsartan (ENTRESTO) 24-26 MG Take 1 tablet by mouth 2 (two) times daily.    ? spironolactone (ALDACTONE) 25 MG tablet Take 25 mg by mouth daily.   ? sucralfate (CARAFATE) 1 g tablet Take 1 g by mouth 4 (four) times daily -  with meals and at bedtime.   ? zonisamide (ZONEGRAN) 100 MG capsule Take 2 capsules  (200 mg total) by mouth at bedtime.   ? ?No facility-administered encounter medications on file as of 01/22/2022.  ? ?SDOH:  (Social Determinants of Health) assessments and interventions performed:  ? ?Care Plan ? ?Review of patient past medical history, allergies, medications, health status, including review of consultants reports, laboratory and other test data, was performed as part of comprehensive evaluation for care management services.  ? ?Care Plan : RN Care Manager Plan of Care  ?Updates made by Deloria Lair, NP since 01/22/2022 12:00 AM  ?  ? ?Problem: Multiple co-morbidites   ?Priority: Medium  ?Onset Date: 09/18/2021  ?  ? ?Long-Range Goal: Pt will report changes in health status at onset of sx as evidenced by pt report and chart review over the next 3 months. Completed 01/22/2022  ?Start Date: 09/18/2021  ?Expected End Date: 12/24/2021  ?This Visit's Progress: On track  ?Priority: High  ?Note:   ? ?Update 01/22/22:  (Status: Goal Met.) Long Term Goal  ?Evaluation of current treatment plan related to reporting health problems and patient's adherence to plan as established by provider ?      Suzanne Galloway is very good about calling NP to report changes and ask for advice. She follows recommendations for prompt resolution. She has several specialists: Dr Herrick/urology/cyst/mass on kidneys, Dr. Lomax/neurology for siezure disorder well controlled, Dr. McCabe/[facility]/cardiology for hx MI X2/CHF NYClassI, Dr. Vern Claude, Dr. Digby/opthamology. She needs a new dentist in the [facility] area. Transitioning pt to her PCP offices new care manager, Clotilde Dieter. ? ?Update 11/21/21  (Status: Goal on Track (progressing): YES.) Long Term Goal  ?Generally feeling well. Monitoring BP daily today 137/54 taking  metoprolol 25 mg daily. ?HF - weight is stable 129, no edema or SOB. Following low salt diet. Taking Entresto and aldactone. ?CAD - no CP, Following Low Fat diet. Has not needed any NTG. ?GERD controlled with pantoprazole and  carafate. ?Encouragement to continue current self management and medical regimen. Report any signs of exacerbation to MD or NP. ? ? ?10/22/21:  (Status: Goal on Track (progressing): YES.) Long Term Goal  ?Evaluation of current treatment plan related to chronic illnesses and patient's adherence to plan as established by provider ?Provided education to patient re: Diagnositc tests ordered by neurology for memory loss and potential treatment. ?Reviewed medications with patient and discussed potential new medication for memory loss which may be ordered by neurology. ? ? ?Current Barriers:  ?None ? ?RNCM Clinical Goal(s):  ?Patient will demonstrate ongoing health management independence as evidenced by pt report each month over the next 3 months.        ?work with NP, PCP to address new and chronic illness problems as evidenced by pt report and chart review.   ?Interventions: ?Inter-disciplinary care team collaboration (see longitudinal plan of care) ?Evaluation of current treatment plan related to  self management and patient's adherence to plan as established by provider ? ?Patient Goals/Self-Care Activities: As evidenced by pt reporting and chart  review each month for the next 3 months. ?Take medications as prescribed   ?Attend all scheduled provider appointments ?Call provider office for new concerns or questions  ? ? ?  ? ?Problem: Memory problems   ?Priority: Medium  ?Onset Date: 11/21/2021  ?  ? ?Long-Range Goal: Patient will exhibit improved memory over the next 3 months as evidenced by improvement on the [facility] test score from current 19/24 (Spacial and naming words beginning with "F" were not included) Completed 01/22/2022  ?Start Date: 11/21/2021  ?Expected End Date: 02/27/2022  ?Priority: Medium  ?Note:   ? ?Update 01/22/22:  (Status: Goal Met.) Long Term Goal  ?Evaluation of current treatment plan related to Memory status and patient's adherence to plan as established by provider ?      Suzanne Galloway had a thorough neurological  evaluation for evidence of some memory deficits. No significant findings, no need for any medication. NP has suggested being mindful, avoid trying to multi-task, keeping calender up to date and checking it dai

## 2022-01-22 NOTE — Patient Instructions (Signed)
Visit Information ? ?Thank you for taking time to visit with me today. Please don't hesitate to contact me if I can be of assistance to you before our next scheduled telephone appointment. ? ?Following are the goals we discussed today: All of which are completed! WAY TO GO!!! ? ?Following is a copy of your care plan:  ?Care Plan : [place] of Care  ?Updates made by Suzanne Lair, NP since 01/22/2022 12:00 AM  ?  ? ?Problem: Multiple co-morbidites   ?Priority: Medium  ?Onset Date: 09/18/2021  ?  ? ?Long-Range Goal: Pt will report changes in health status at onset of sx as evidenced by pt report and chart review over the next 3 months. Completed 01/22/2022  ?Start Date: 09/18/2021  ?Expected End Date: 12/24/2021  ?This Visit's Progress: On track  ?Priority: High  ?Note:   ? ?Update 01/22/22:  (Status: Goal Met.) Long Term Goal  ?Evaluation of current treatment plan related to reporting health problems and patient's adherence to plan as established by provider ?      Suzanne Galloway is very good about calling NP to report changes and ask for advice. She follows recommendations for prompt resolution. She has several specialists: Dr Herrick/urology/cyst/mass on kidneys, Dr. Lomax/neurology for siezure disorder well controlled, Dr. McCabe/[facility]/cardiology for hx MI X2/CHF NYClassI, Dr. Vern Claude, Dr. Digby/opthamology. She needs a new dentist in the [facility] area. Transitioning pt to her PCP offices new care manager, Suzanne Galloway. ? ?Update 11/21/21  (Status: Goal on Track (progressing): YES.) Long Term Goal  ?Generally feeling well. Monitoring BP daily today 137/54 taking  metoprolol 25 mg daily. ?HF - weight is stable 129, no edema or SOB. Following low salt diet. Taking Entresto and aldactone. ?CAD - no CP, Following Low Fat diet. Has not needed any NTG. ?GERD controlled with pantoprazole and carafate. ?Encouragement to continue current self management and medical regimen. Report any signs of exacerbation to MD or  NP. ? ? ?10/22/21:  (Status: Goal on Track (progressing): YES.) Long Term Goal  ?Evaluation of current treatment plan related to chronic illnesses and patient's adherence to plan as established by provider ?Provided education to patient re: Diagnositc tests ordered by neurology for memory loss and potential treatment. ?Reviewed medications with patient and discussed potential new medication for memory loss which may be ordered by neurology. ? ? ?Current Barriers:  ?None ? ?RNCM Clinical Goal(s):  ?Patient will demonstrate ongoing health management independence as evidenced by pt report each month over the next 3 months.        ?work with NP, PCP to address new and chronic illness problems as evidenced by pt report and chart review.   ?Interventions: ?Inter-disciplinary care team collaboration (see longitudinal plan of care) ?Evaluation of current treatment plan related to  self management and patient's adherence to plan as established by provider ? ?Patient Goals/Self-Care Activities: As evidenced by pt reporting and chart review each month for the next 3 months. ?Take medications as prescribed   ?Attend all scheduled provider appointments ?Call provider office for new concerns or questions  ? ? ?  ? ?Problem: Memory problems   ?Priority: Medium  ?Onset Date: 11/21/2021  ?  ? ?Long-Range Goal: Patient will exhibit improved memory over the next 3 months as evidenced by improvement on the [facility] test score from current 19/24 (Spacial and naming words beginning with "F" were not included) Completed 01/22/2022  ?Start Date: 11/21/2021  ?Expected End Date: 02/27/2022  ?Priority: Medium  ?Note:   ? ?  Update 01/22/22:  (Status: Goal Met.) Long Term Goal  ?Evaluation of current treatment plan related to Memory status and patient's adherence to plan as established by provider ?      Suzanne Galloway had a thorough neurological evaluation for evidence of some memory deficits. No significant findings, no need for any medication. NP has suggested being  mindful, avoid trying to multi-task, keeping calender up to date and checking it daily. Today she has all her upcoming appts on her calendar and was able to tell me who, what, when, where. ? ?Current Barriers:  ?Chronic Disease Management support and education needs related to Memory Impairment ? ?RNCM Clinical Goal(s):  ?Patient will verbalize understanding of plan for management of Memory Impairment as evidenced by patient report and chart review. ?take all medications exactly as prescribed and will call provider for medication related questions as evidenced by patient report.    ?Complete memory exercises suggested by and  through collaboration with RN Care manager, provider, and care team.  ? ?Interventions: ?Inter-disciplinary care team collaboration (see longitudinal plan of care) ?Evaluation of current treatment plan related to  self management and patient's adherence to plan as established by provider ?              Administered MOCA telephonically minus the spatial questions (5 points) and naming words starting with 'F" (1 point) ?              Score was 19/24. When administering the entire tool (30 questions) a score of 26-30 is considered normal. This score would  ?              be considered abnormal but only slightly so. The areas of deficit are in ATTENTION and DELAYED RECALL. ?      Verfiied upcoming neuropsychiatry appts this month. ?      Suggested memory exercises and mindfulness. Keep a calendar for appts. ? ?Patient Goals/Self-Care Activities: ?Take medications as prescribed   ?Attend all scheduled provider appointments ?Call pharmacy for medication refills 3-7 days in advance of running out of medications ?Attend church or other social activities ? ? ?  ? ? ?The patient verbalized understanding of instructions, educational materials, and care plan provided today and agreed to receive a mailed copy of patient instructions, educational materials, and care plan.  ? ?No further follow up required:  Pt transitioned to her PCP office care manager. ? ?Suzanne Galloway. Suzanne Yawn, MSN, GNP-BC ?Gerontological Nurse Practitioner ?[facility] Care Management ?[phone] ? ? ? ? ?

## 2022-01-26 ENCOUNTER — Encounter: Payer: Self-pay | Admitting: Internal Medicine

## 2022-01-26 ENCOUNTER — Ambulatory Visit: Payer: Medicare Other | Admitting: Internal Medicine

## 2022-01-26 VITALS — BP 130/78 | HR 73 | Ht 62.0 in | Wt 132.8 lb

## 2022-01-26 DIAGNOSIS — K5900 Constipation, unspecified: Secondary | ICD-10-CM

## 2022-01-26 DIAGNOSIS — R1010 Upper abdominal pain, unspecified: Secondary | ICD-10-CM

## 2022-01-26 DIAGNOSIS — Z86010 Personal history of colonic polyps: Secondary | ICD-10-CM

## 2022-01-26 DIAGNOSIS — E739 Lactose intolerance, unspecified: Secondary | ICD-10-CM

## 2022-01-26 DIAGNOSIS — K449 Diaphragmatic hernia without obstruction or gangrene: Secondary | ICD-10-CM

## 2022-01-26 MED ORDER — AMBULATORY NON FORMULARY MEDICATION: 0 refills | Status: DC

## 2022-01-26 MED ORDER — PLENVU 140 G PO SOLR: ORAL | 0 refills | Status: DC

## 2022-01-26 NOTE — Progress Notes (Signed)
? ?Chief Complaint: Abdominal pain ? ?HPI : 81 year old female w/hx of CAD s/p PCI, GERD, IBS, hypothyroidism, partial epilepsy, asthma, HTN, mitral valve prolapse, lactose intolerance presents for follow up of abdominal pain. ? ?Interval History: ?Patient has been doing relatively well since her last visit. She did experience 2 episodes of epigastric abdominal pain.  These episodes of abdominal pain were followed by hematuria so it is unclear if the abdominal pain was related to a urinary issue.  Patient was put on Pepcid in the interim, but then since discontinued this.  She has still been compliant with her pantoprazole 40 mg twice a day and Neuralex once a day.  She does feel like she has a good bowel movement daily while taking the MiraLAX.  She has been continuing her Lactaid when ingesting lactose.  She has felt more bloated recently.  Since her last visit, she did start her iron supplementation daily for iron deficiency ? ? ?Current Outpatient Medications  ?Medication Sig Dispense Refill  ? albuterol (VENTOLIN HFA) 108 (90 Base) MCG/ACT inhaler Inhale 1 puff into the lungs every 4 (four) hours.    ? Ascorbic Acid (VITAMIN C PO) Take 1 tablet by mouth daily.    ? aspirin 81 MG chewable tablet Chew 81 mg by mouth in the morning and at bedtime.     ? Calcium Carbonate-Vitamin D 600-400 MG-UNIT tablet Take 1 tablet by mouth daily.    ? desvenlafaxine (PRISTIQ) 50 MG 24 hr tablet Take 100 mg by mouth daily.    ? famotidine (PEPCID) 40 MG tablet Take 40 mg by mouth 2 (two) times daily.    ? lacosamide ([name]) 50 MG TABS tablet TAKE ONE TABLET BY MOUTH TWICE DAILY 180 tablet 1  ? levothyroxine (SYNTHROID) 75 MCG tablet Take 75 mcg by mouth daily before breakfast.    ? loperamide (IMODIUM A-D) 2 MG tablet Take 2 mg by mouth 4 (four) times daily as needed for diarrhea or loose stools.    ? metoprolol succinate (TOPROL-XL) 25 MG 24 hr tablet Take 25 mg by mouth daily.    ? Multiple Vitamins-Minerals (PRESERVISION  AREDS PO) Take 1 tablet by mouth daily.     ? nitroGLYCERIN (NITROSTAT) 0.4 MG SL tablet Place 0.4 mg under the tongue every 5 (five) minutes as needed for chest pain.    ? pantoprazole (PROTONIX) 40 MG tablet Take 1 tablet (40 mg total) by mouth 2 (two) times daily before a meal. 180 tablet 1  ? Polyethyl Glycol-Propyl Glycol (SYSTANE OP) Apply 1 drop to eye daily as needed.    ? polyethylene glycol powder (GLYCOLAX/MIRALAX) 17 GM/SCOOP powder Dissolve 1 capful in 8 ounces of fluid and drink daily 255 g 3  ? sacubitril-valsartan (ENTRESTO) 24-26 MG Take 1 tablet by mouth 2 (two) times daily.     ? spironolactone (ALDACTONE) 25 MG tablet Take 25 mg by mouth daily.    ? sucralfate (CARAFATE) 1 g tablet Take 1 g by mouth 4 (four) times daily -  with meals and at bedtime.    ? zonisamide (ZONEGRAN) 100 MG capsule Take 2 capsules (200 mg total) by mouth at bedtime. 180 capsule 4  ? ?No current facility-administered medications for this visit.  ? ?Review of Systems: ?All systems reviewed and negative except where noted in HPI.  ? ?Physical Exam: ?BP 130/78   Pulse 73   Ht '5\' 2"'$  (1.575 m)   Wt 132 lb 12.8 oz (60.2 kg)   SpO2 98%  BMI 24.29 kg/m?  ?Constitutional: Pleasant,well-developed, female in no acute distress. ?HEENT: Normocephalic and atraumatic. Conjunctivae are normal. No scleral icterus. ?Cardiovascular: Normal rate, regular rhythm.  ?Pulmonary/chest: Effort normal and breath sounds normal. No wheezing, rales or rhonchi. ?Abdominal: Soft, nondistended, non-tender. Bowel sounds active throughout. There are no masses palpable. No hepatomegaly. ?Extremities: no edema ?Neurological: Alert and oriented to person place and time. ?Skin: Skin is warm and dry. No rashes noted. ?Psychiatric: Normal mood and affect. Behavior is normal. ? ?Labs 11/15/18: Nml lipase ? ?Labs 07/01/20: Unremarkable CBC and CMP ? ?Labs 08/16/20: HbA1C 6.5% ? ?Labs 05/22/21: TSH 6.41 (H) (nml 0.34-4.5), negative GI pathogen panel, C dif  negative ? ?H pylori breath test 11/25/18: negative ? ?TTE 02/28/21: ?Left Ventricle: The calculated left ventricular ejection fraction is 50%.  ?  Left Ventricle: Left ventricle size is normal.  ?  Left Ventricle: Wall motion abnormalities as outlined in graphic  ?representation.  ?  Aortic Valve: The aortic valve is tricuspid. There is mild sclerosis.  ?  Aortic Valve: There is no regurgitation.  ?  Mitral Valve: There is mild regurgitation.  ?  Tricuspid Valve: There is mild regurgitation.  ?  Tricuspid Valve: The right ventricular systolic pressure is normal (<36  ?mmHg). ? ?KUB 04/08/21: ?IMPRESSION: ?Normal bowel gas pattern.  Moderate retained stool in the colon. ? ?EGD 08/19/98: ? ?Colonoscopy 08/19/98: Normal exam ? ?Colonoscopy 04/04/02: Only half of colonoscopy was able to be performed due to patient intolerance. Procedure was normal from splenic flexure to rectum ? ?EGD 07/16/21: ?- White nummular lesions in esophageal mucosa. Biopsied. ?- Non-obstructing Schatzki ring. ?- Large hiatal hernia. ?- Erythematous mucosa in the antrum. Biopsied. ?- Normal examined duodenum. ?Path: ?1. Surgical [P], gastric biopsies ?- REACTIVE GASTROPATHY ?- NO H. PYLORI OR INTESTINAL METAPLASIA IDENTIFIED ?- NO H PYLORI ?2. Surgical [P], esophageal biopsies ?- BENIGN SQUAMOUS MUCOSA ?- NO INCREASED INTRAEPITHELIAL EOSINOPHILS ? ?Colonoscopy 07/16/21: ?- The examined portion of the ileum was normal. ?- One 8 mm polyp in the cecum, removed with a cold snare. Resected and retrieved. ?- One 16 mm polyp in the ascending colon, removed with mucosal resection piecemeal. Resected and retrieved. Clips were placed. ?- One 3 mm polyp in the descending colon, removed with a cold snare. Resected and retrieved. ?- Non-bleeding internal hemorrhoids. ?- Mucosal resection was performed. Resection and retrieval were complete ?Path: ?3. Surgical [P], colon, cecum, polyp (1) ?- TUBULAR ADENOMA (5 OF 5 FRAGMENTS) ?- NO HIGH-GRADE DYSPLASIA OR MALIGNANCY  IDENTIFIED ?4. Surgical [P], colon, ascending, polyp (1) ?- MULTIPLE FRAGMENTS OF TUBULAR ADENOMA(S) ?- NO HIGH-GRADE DYSPLASIA OR MALIGNANCY IDENTIFIED ?5. Surgical [P], colon, sigmoid, polyp (1) ?- BENIGN COLONIC MUCOSA (1 OF 1 FRAGMENTS) ?- NO HIGH-GRADE DYSPLASIA OR MALIGNANCY IDENTIFIED ? ?ASSESSMENT AND PLAN: ? ?Intermittent upper abdominal pain ?Constipation ?Lactose intolerance ?History of colon polyps ?Hiatal hernia ?Patient had 2 episodes of epigastric abdominal pain since her last visit.  Unclear if these episodes are related to GERD or constipation.  However, will give her some GI cocktail to use as needed since this could help with both reflux and constipation issues.  Patient did start on iron supplementation therapy since our last visit we will ask her to increase her MiraLAX to 1.5 doses per day to see if this helps with her underlying bloating.  I will also give her a low FODMAP diet handout to see if she can identify any foods that may be making her bloating worse.  Will go ahead and  schedule her next colonoscopy to evaluate the previously noted polypectomy site. ?- Continue daily fiber supplement ?- Continue PPI BID.  ?- Increase Miralax to 1.5 doses per day ?- Low FODMAP diet handout ?- GI cocktail PRN ?- Plan for repeat colonoscopy to evaluate polypectomy site ? ?Christia Reading, MD ? ?I spent 43 minutes of time, including in depth chart review, independent review of results as outlined above, communicating results with the patient directly, face-to-face time with the patient, coordinating care, ordering studies and medications as appropriate, and documentation. ? ?

## 2022-01-26 NOTE — Patient Instructions (Signed)
If you are age 80 or older, your body mass index should be between 23-30. Your Body mass index is 24.29 kg/m?Marland Kitchen If this is out of the aforementioned range listed, please consider follow up with your Primary Care Provider. ? ?If you are age 71 or younger, your body mass index should be between 19-25. Your Body mass index is 24.29 kg/m?Marland Kitchen If this is out of the aformentioned range listed, please consider follow up with your Primary Care Provider.  ? ?You have been scheduled for a colonoscopy. Please follow written instructions given to you at your visit today.  ?Please pick up your prep supplies at the pharmacy within the next 1-3 days. ?If you use inhalers (even only as needed), please bring them with you on the day of your procedure. ? ?Follow Low Fodmap diet.  ? ?We have sent the following medications to your pharmacy for you to pick up at your convenience: GI cocktail take as needed. ? ?Increase Miralax to 1 1/2 capful daily.  ? ?The [facility] providers would like to encourage you to use [facility] to communicate with providers for non-urgent requests or questions.  Due to long hold times on the telephone, sending your provider a message by [facility] may be a faster and more efficient way to get a response.  Please allow 48 business hours for a response.  Please remember that this is for non-urgent requests.  ? ?It was a pleasure to see you today! ? ?Thank you for trusting me with your gastrointestinal care!   ? ? ? ?

## 2022-01-27 ENCOUNTER — Ambulatory Visit: Payer: Medicare Other | Admitting: Internal Medicine

## 2022-01-27 VITALS — BP 122/64 | HR 68 | Temp 97.7°F | Resp 16 | Wt 168.7 lb

## 2022-01-27 DIAGNOSIS — E039 Hypothyroidism, unspecified: Secondary | ICD-10-CM

## 2022-01-27 DIAGNOSIS — E7800 Pure hypercholesterolemia, unspecified: Secondary | ICD-10-CM

## 2022-01-27 NOTE — Progress Notes (Signed)
Chief Complaint: Patient presents today with   Follow-up on the  cholesterol    Patient WAS wearing a surgical mask   Contact and Droplet precautions were followed when caring for the patient.   PPE used by provider during encounter: Surgical mask,face shield and gloves used     [name] If you are reviewing this progress note and have questions about the meaning or medical terms being used, please schedule an appointment or bring it up at your next follow-up appointment. Medical notes are meant to be a communication tool between medical professionals and require medical terms to be used for efficiency.      - Voice recognition software may have been used in portions of this report.   Inadvertent computerized transcription errors may be present despite efforts.    Portions of this note were dictated using [employer]. Omissions, "sound-alikes" and sentence fragmentation are unintentional.      Hyperlipidemia,    management discussed with patient, shehas  been working on  diet, she has  been doing regular exercise. Patient verbalised understanding about the significance for regular exercise and low fat diet in management of this problem      Lab Results   Lab Name Value Date/Time    CHOL 178 01/20/2022 07:51 AM    CHOL 188 05/09/2014 09:44 AM    LDLC 92 01/20/2022 07:51 AM    LDLC 107 05/09/2014 09:44 AM    HDL 68 01/20/2022 07:51 AM    HDL 59 05/09/2014 09:44 AM    TRIG 92 01/20/2022 07:51 AM    TRIG 108 05/09/2014 09:44 AM   .     Lab Results   Lab Name Value Date/Time    NA 143 01/20/2022 07:51 AM    NA 139 05/09/2014 09:44 AM    K 4.0 01/20/2022 07:51 AM    K 3.9 05/09/2014 09:44 AM    CL 107 01/20/2022 07:51 AM    CL 106 05/09/2014 09:44 AM    CO2 27 01/20/2022 07:51 AM    CO2 27 05/09/2014 09:44 AM    BUN 13 01/20/2022 07:51 AM    BUN 15 05/09/2014 09:44 AM    CR 0.76 01/20/2022 07:51 AM    CR 0.69 05/09/2014 09:44 AM    GLU 99 01/20/2022 07:51 AM    GLU 96 05/09/2014 09:44 AM     No symptoms of hypo or  hyperthyroidism: no decreased or increased weight, no feeling cold/chilly or excessively warm, no diarrhea or constipation, no undue sweatiness, anxiety or palpitations.     Lab Results   Lab Name Value Date/Time    TSH 1.14 01/20/2022 07:51 AM    TSH 1.89 05/09/2014 09:44 AM    FT4 1.27 06/17/2009 08:16 AM      She fainted after her last COVID vaccine declines any more vaccination    Fall risks Assessment:   No falls in past year. No throw rugs, poorly lighted areas, missing rails/grab bars in the home. No medications or conditions that affect stability or mobility.     Vs  BP 122/64 (SITE: right arm, Orthostatic Position: sitting, Cuff Size: regular)   Pulse 68   Temp 36.5 C (97.7 F) (Temporal)   Resp 16   Wt 76.5 kg (168 lb 11.2 oz)   SpO2 97%   BMI 28.07 kg/m?     Ros  complete review of symptons was done all other systems are negative except as otherwise stated in HPI  Physical exam -  General Appearance: healthy, alert, no distress, pleasant affect, cooperative.  Eyes:  conjunctivae and corneas clear. PERRL, EOM's intact. sclerae normal.  Ears:  external inspection of ears show no abnormality.  Lungs: no chest deformities noted .lungs clear to auscultation.  Heart:  normal rate and regular rhythm, no murmurs,  Extremities:  no cyanosis, clubbing, or edema.      ASSESMENT AND PLAN     (E03.9) Acquired hypothyroidism  (primary encounter diagnosis)  Comment:   Plan: Thyroid Stimulating Hormone                  (E78.00) Pure hypercholesterolemia  Comment:   Plan: Comprehensive Metabolic Panel             I did review patient's past medical and family/social history, no changes noted.  Barriers to Learning assessed: none. Patient verbalizes understanding of teaching and instructions.  Zettie Cooley MD      Electronically Signed By:  Zettie Cooley, MD  Physician associate   [facility] [place]  [phone]

## 2022-01-27 NOTE — Patient Instructions (Signed)
Shingles vaccination is the only way to protect against shingles and postherpetic neuralgia (PHN), the most common complication from shingles.   Shingrix provides strong protection against shingles and PHN. Two doses of Shingrix is more than 90% effective at preventing shingles and PHN. Protection stays above 85% for at least the first four years after you get vaccinated. Shingrix is the preferred vaccine, over Zostavax (zoster vaccine live), a shingles vaccine in use since 2006.    Healthy adults 50 years and older should get two doses of Shingrix, separated by 2 to 6 months. You should get Shingrix even if in the past you    Had shingles  received Zostavax  Are not sure if you had chickenpox

## 2022-01-27 NOTE — Nursing Note (Signed)
Patient has been identified by first, last name and date of birth.     Since you last saw your [facility] provider, have you been seen by any other physicians or surgeons, gone to the emergency room/urgent care or been hospitalized?  no      Have you started any over-the-counter medications, herbal supplements, vitamins or home remedies?  no      Tammy [name]  is here for follow up     Patient is accompanied by self      Patient WAS wearing a surgical mask  Contact precautions were followed when caring for the patient.   PPE used by provider during encounter: Surgical mask         Markos Theil MA

## 2022-02-03 DIAGNOSIS — I255 Ischemic cardiomyopathy: Secondary | ICD-10-CM | POA: Diagnosis not present

## 2022-02-03 DIAGNOSIS — I10 Essential (primary) hypertension: Secondary | ICD-10-CM | POA: Diagnosis not present

## 2022-02-03 DIAGNOSIS — I2510 Atherosclerotic heart disease of native coronary artery without angina pectoris: Secondary | ICD-10-CM | POA: Diagnosis not present

## 2022-02-03 DIAGNOSIS — I252 Old myocardial infarction: Secondary | ICD-10-CM | POA: Diagnosis not present

## 2022-02-03 DIAGNOSIS — E782 Mixed hyperlipidemia: Secondary | ICD-10-CM | POA: Diagnosis not present

## 2022-02-04 DIAGNOSIS — Z961 Presence of intraocular lens: Secondary | ICD-10-CM | POA: Diagnosis not present

## 2022-02-04 DIAGNOSIS — H43813 Vitreous degeneration, bilateral: Secondary | ICD-10-CM | POA: Diagnosis not present

## 2022-02-04 DIAGNOSIS — H524 Presbyopia: Secondary | ICD-10-CM | POA: Diagnosis not present

## 2022-02-13 DIAGNOSIS — I10 Essential (primary) hypertension: Secondary | ICD-10-CM | POA: Diagnosis not present

## 2022-02-13 DIAGNOSIS — M7062 Trochanteric bursitis, left hip: Secondary | ICD-10-CM | POA: Diagnosis not present

## 2022-02-13 DIAGNOSIS — M25552 Pain in left hip: Secondary | ICD-10-CM | POA: Diagnosis not present

## 2022-02-26 ENCOUNTER — Encounter: Payer: Self-pay | Admitting: Internal Medicine

## 2022-03-03 ENCOUNTER — Telehealth: Payer: Self-pay | Admitting: Family Medicine

## 2022-03-03 NOTE — Telephone Encounter (Signed)
Rescheduled 7/3 appt with pt over the phone- NP out. ?

## 2022-03-05 ENCOUNTER — Ambulatory Visit (AMBULATORY_SURGERY_CENTER): Payer: Medicare Other | Admitting: Internal Medicine

## 2022-03-05 ENCOUNTER — Encounter: Payer: Self-pay | Admitting: Internal Medicine

## 2022-03-05 VITALS — BP 114/60 | HR 75 | Temp 97.0°F | Resp 15 | Ht 62.0 in | Wt 132.0 lb

## 2022-03-05 DIAGNOSIS — Z86010 Personal history of colonic polyps: Secondary | ICD-10-CM | POA: Diagnosis not present

## 2022-03-05 DIAGNOSIS — K635 Polyp of colon: Secondary | ICD-10-CM | POA: Diagnosis not present

## 2022-03-05 DIAGNOSIS — E039 Hypothyroidism, unspecified: Secondary | ICD-10-CM | POA: Diagnosis not present

## 2022-03-05 DIAGNOSIS — D122 Benign neoplasm of ascending colon: Secondary | ICD-10-CM

## 2022-03-05 DIAGNOSIS — J45909 Unspecified asthma, uncomplicated: Secondary | ICD-10-CM | POA: Diagnosis not present

## 2022-03-05 MED ORDER — SODIUM CHLORIDE 0.9 % IV SOLN: 500.0000 mL | Freq: Once | INTRAVENOUS | Status: DC

## 2022-03-05 NOTE — Op Note (Addendum)
[place] Patient Name: Suzanne Galloway Procedure Date: 03/05/2022 8:42 AM MRN: [id number] Endoscopist: Sonny Masters "Suzanne Galloway ,  Age: 81 Referring MD:  Date of Birth: 03-20-1941 Gender: Female Account #: [id number] Procedure:                Colonoscopy Indications:              High risk colon cancer surveillance: Personal                            history of colonic polyps Medicines:                Monitored Anesthesia Care Procedure:                Pre-Anesthesia Assessment:                           - Prior to the procedure, a History and Physical                            was performed, and patient medications and                            allergies were reviewed. The patient's tolerance of                            previous anesthesia was also reviewed. The risks                            and benefits of the procedure and the sedation                            options and risks were discussed with the patient.                            All questions were answered, and informed consent                            was obtained. Prior Anticoagulants: The patient has                            taken no previous anticoagulant or antiplatelet                            agents. ASA Grade Assessment: III - A patient with                            severe systemic disease. After reviewing the risks                            and benefits, the patient was deemed in                            satisfactory condition to undergo the procedure.  After obtaining informed consent, the colonoscope                            was passed under direct vision. Throughout the                            procedure, the patient's blood pressure, pulse, and                            oxygen saturations were monitored continuously. The                            CF [id number] #[id number] was introduced through the anus                            and advanced to the the terminal  ileum. The                            colonoscopy was performed without difficulty. The                            patient tolerated the procedure well. The quality                            of the bowel preparation was good. The terminal                            ileum, ileocecal valve, appendiceal orifice, and                            rectum were photographed. Scope In: 8:46:40 AM Scope Out: 9:20:25 AM Scope Withdrawal Time: 0 hours 22 minutes 34 seconds  Total Procedure Duration: 0 hours 33 minutes 45 seconds  Findings:                 The terminal ileum appeared normal.                           A 4 mm polyp was found in the ascending colon. The                            polyp was sessile. Polypectomy was attempted,                            initially using a cold snare. Polyp resection was                            incomplete with this device. This intervention then                            required a different device and polypectomy                            technique. The polyp was removed with a cold  biopsy                            forceps. Resection and retrieval were complete.                           A few diverticula were found in the sigmoid colon.                           Non-bleeding internal hemorrhoids were found during                            retroflexion. Complications:            No immediate complications. Estimated Blood Loss:     Estimated blood loss was minimal. Impression:               - The examined portion of the ileum was normal.                           - One 4 mm polyp in the ascending colon, removed                            with a cold biopsy forceps. Resected and retrieved.                           - Diverticulosis in the sigmoid colon.                           - Non-bleeding internal hemorrhoids. Recommendation:           - Discharge patient to home (with escort).                           - Await pathology results.                            - The findings and recommendations were discussed                            with the patient.                           - Return to GI clinic in 1 month. Sonny Masters "Suzanne Galloway,  03/05/2022 9:27:01 AM

## 2022-03-05 NOTE — Progress Notes (Signed)
I have reviewed the patient's medical history in detail and updated the computerized patient record.

## 2022-03-05 NOTE — Progress Notes (Signed)
Called to room to assist during endoscopic procedure.  Patient ID and intended procedure confirmed with present staff. Received instructions for my participation in the procedure from the performing physician.  

## 2022-03-05 NOTE — Progress Notes (Signed)
GASTROENTEROLOGY PROCEDURE H&P NOTE   Primary Care Physician: Orpah Melter, MD    Reason for Procedure:   History of colon polyps  Plan:    Colonoscopy  Patient is appropriate for endoscopic procedure(s) in the ambulatory ([place]) setting.  The nature of the procedure, as well as the risks, benefits, and alternatives were carefully and thoroughly reviewed with the patient. Ample time for discussion and questions allowed. The patient understood, was satisfied, and agreed to proceed.     HPI: Suzanne Galloway is a 81 y.o. female who presents for colonoscopy for evaluation of history of colon polyps .  Patient was most recently seen in the Gastroenterology Clinic on 01/26/22.  No interval change in medical history since that appointment. Please refer to that note for full details regarding GI history and clinical presentation.   Past Medical History:  Diagnosis Date   Arthritis    "hands, back, toes" (01/01/2015)   Asthma, uncomplicated    Atrophic gastritis    Atypical chest pain 03/20/2020   Bereavement due to life event    Bronchitis, acute 07/09/2017   Coronary artery disease involving native coronary artery of native heart without angina pectoris 03/10/2020   S/P STEMI 03/10/20, DES LAD, VFib Arrest   Dysphagia    Essential hypertension 01/01/2015   Exertional shortness of breath 12/28/2014   GERD (gastroesophageal reflux disease)    Greater trochanteric pain syndrome 11/12/2017   Hearing loss    hearing aids   Hemorrhoids    Hiatal hernia    Hyperlipemia    Hypothyroidism    IBS (irritable bowel syndrome)    Impaired fasting glucose    Ischemic cardiomyopathy 03/10/2020   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, not intractable, without status epilepticus 05/12/2017   Localized, primary osteoarthritis of hand 12/16/2021   Mitral valve prolapse    Mixed hyperlipidemia 01/01/2015   Myocardial  infarction 03/09/2020   Overactive bladder    Schatzki's ring    Status post insertion of drug eluting coronary artery stent 03/10/2020   Tubular adenoma of colon    Urinary incontinence     Past Surgical History:  Procedure Laterality Date   [place] Bilateral 2008   CYSTECTOMY Right 09/2014   "hand"    DILATION AND CURETTAGE OF UTERUS     FINGER SURGERY Bilateral    "scraped arthritis out of thumbs"   [place]; 12/2009   ; [facility] 01/16/2010   TOE SURGERY Left 09/21/2005   "took bone out of 2nd toe; took cyst out"   [facility] [place]    Prior to Admission medications   Medication Sig Start Date End Date Taking? Authorizing Provider  AMBULATORY NON FORMULARY MEDICATION 90 ml Viscous Lidocaine , 90 ml '10mg'$ /52m Dicyclomine , 270 ml Malox. 01/26/22  Yes DSharyn Creamer MD  Ascorbic Acid (VITAMIN C PO) Take 1 tablet by mouth daily.   Yes [provider]  aspirin 81 MG chewable tablet Chew 81 mg by mouth in the morning and at bedtime.    Yes [provider]  Calcium Carbonate-Vitamin D 600-400 MG-UNIT tablet Take 1 tablet by mouth daily.   Yes [provider]  desvenlafaxine (PRISTIQ) 50 MG 24 hr tablet Take 100 mg by mouth daily.  Yes [provider]  famotidine (PEPCID) 40 MG tablet Take 40 mg by mouth 2 (two) times daily.   Yes [provider]  lacosamide ([name]) 50 MG TABS tablet TAKE ONE TABLET BY MOUTH TWICE DAILY 01/10/22  Yes Camara, Maryan Puls, MD  levothyroxine (SYNTHROID) 75 MCG tablet Take 75 mcg by mouth daily before breakfast.   Yes [provider]  metoprolol succinate (TOPROL-XL) 25 MG 24 hr tablet Take 25 mg by mouth daily.   Yes [provider]  Multiple Vitamins-Minerals (PRESERVISION AREDS PO) Take 1 tablet by mouth daily.    Yes [provider]  pantoprazole (PROTONIX) 40 MG tablet Take 1 tablet (40 mg total) by mouth 2 (two) times daily before a meal. 10/14/21  Yes Sharyn Creamer, MD  Polyethyl Glycol-Propyl Glycol (SYSTANE OP) Apply 1 drop to eye daily as needed.   Yes [provider]  sacubitril-valsartan (ENTRESTO) 24-26 MG Take 1 tablet by mouth 2 (two) times daily.    Yes [provider]  spironolactone (ALDACTONE) 25 MG tablet Take 25 mg by mouth daily.   Yes [provider]  sucralfate (CARAFATE) 1 g tablet Take 1 g by mouth 4 (four) times daily -  with meals and at bedtime.   Yes [provider]  zonisamide (ZONEGRAN) 100 MG capsule Take 2 capsules (200 mg total) by mouth at bedtime. 10/21/21  Yes Lomax, [name], NP  albuterol (VENTOLIN HFA) 108 (90 Base) MCG/ACT inhaler Inhale 1 puff into the lungs every 4 (four) hours. 07/01/21   [provider]  loperamide (IMODIUM A-D) 2 MG tablet Take 2 mg by mouth 4 (four) times daily as needed for diarrhea or loose stools.    [provider]  nitroGLYCERIN (NITROSTAT) 0.4 MG SL tablet Place 0.4 mg under the tongue every 5 (five) minutes as needed for chest pain.    [provider]  polyethylene glycol powder (GLYCOLAX/MIRALAX) 17 GM/SCOOP powder Dissolve 1 capful in 8 ounces of fluid and drink daily 07/17/21   Sharyn Creamer, MD    Current Outpatient Medications  Medication Sig Dispense Refill   AMBULATORY NON FORMULARY MEDICATION 90 ml Viscous Lidocaine , 90 ml '10mg'$ /72m Dicyclomine , 270 ml Malox. 450 mL 0   Ascorbic Acid (VITAMIN C PO) Take 1 tablet by mouth daily.     aspirin 81 MG chewable tablet Chew 81 mg by mouth in the morning and at bedtime.      Calcium Carbonate-Vitamin D 600-400 MG-UNIT tablet Take 1 tablet by mouth daily.     desvenlafaxine (PRISTIQ) 50 MG 24 hr tablet Take 100 mg by mouth daily.     famotidine (PEPCID) 40 MG tablet Take 40 mg by mouth 2 (two) times daily.     lacosamide ([name]) 50 MG  TABS tablet TAKE ONE TABLET BY MOUTH TWICE DAILY 180 tablet 1   levothyroxine (SYNTHROID) 75 MCG tablet Take 75 mcg by mouth daily before breakfast.     metoprolol succinate (TOPROL-XL) 25 MG 24 hr tablet Take 25 mg by mouth daily.     Multiple Vitamins-Minerals (PRESERVISION AREDS PO) Take 1 tablet by mouth daily.      pantoprazole (PROTONIX) 40 MG tablet Take 1 tablet (40 mg total) by mouth 2 (two) times daily before a meal. 180 tablet 1   Polyethyl Glycol-Propyl Glycol (SYSTANE OP) Apply 1 drop to eye daily as needed.     sacubitril-valsartan (ENTRESTO) 24-26 MG Take 1 tablet by mouth 2 (two) times daily.  spironolactone (ALDACTONE) 25 MG tablet Take 25 mg by mouth daily.     sucralfate (CARAFATE) 1 g tablet Take 1 g by mouth 4 (four) times daily -  with meals and at bedtime.     zonisamide (ZONEGRAN) 100 MG capsule Take 2 capsules (200 mg total) by mouth at bedtime. 180 capsule 4   albuterol (VENTOLIN HFA) 108 (90 Base) MCG/ACT inhaler Inhale 1 puff into the lungs every 4 (four) hours.     loperamide (IMODIUM A-D) 2 MG tablet Take 2 mg by mouth 4 (four) times daily as needed for diarrhea or loose stools.     nitroGLYCERIN (NITROSTAT) 0.4 MG SL tablet Place 0.4 mg under the tongue every 5 (five) minutes as needed for chest pain.     polyethylene glycol powder (GLYCOLAX/MIRALAX) 17 GM/SCOOP powder Dissolve 1 capful in 8 ounces of fluid and drink daily 255 g 3   Current Facility-Administered Medications  Medication Dose Route Frequency Provider Last Rate Last Admin   0.9 %  sodium chloride infusion  500 mL Intravenous Once Sharyn Creamer, MD        Allergies as of 03/05/2022 - Review Complete 03/05/2022  Allergen Reaction Noted   Codeine Other (See Comments) 10/26/2015   Lamictal [lamotrigine] Other (See Comments) 06/11/2017   Oxcarbazepine Other (See Comments) 06/11/2017   Symbicort [budesonide-formoterol fumarate] Other (See Comments) 11/18/2018   Atorvastatin  01/28/2018    Buspirone  01/28/2018   Carbamazepine Other (See Comments) 09/23/2018   Depakote [divalproex sodium] Nausea And Vomiting 04/11/2018   Dilaudid [hydromorphone] Other (See Comments) 08/05/2015   Dulera [mometasone furo-formoterol fum] Other (See Comments)    Lactose intolerance (gi) Diarrhea 01/24/2015   Morphine and related Nausea And Vomiting 09/01/2012   Percocet [oxycodone-acetaminophen] Other (See Comments) 01/01/2015   Pravastatin Other (See Comments) 08/05/2015   Vesicare [solifenacin] Other (See Comments) 06/16/2013   Zonisamide Other (See Comments) 09/23/2018    Family History  Problem Relation Age of Onset   Parkinson's disease Mother    Heart failure Mother    Diabetes Mother    COPD Father        smoked   Heart disease Father    Esophageal cancer Father        smoked   Transient ischemic attack Father    Cancer Father        kidney   Deep vein thrombosis Brother    Diabetes Maternal Grandmother    Diabetes Paternal Grandfather    Cancer Daughter    Colon polyps Daughter    Alzheimer's disease Cousin        2 maternal cousins   Colon cancer Neg Hx    Stomach cancer Neg Hx     Social History   Socioeconomic History   Marital status: Widowed    Spouse name: Not on file   Number of children: 3   Years of education: 10   Highest education level: GED or equivalent  Occupational History   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  Tobacco Use   Smoking status: Never   Smokeless tobacco: Never  Vaping Use   Vaping Use: Never used  Substance and Sexual Activity   Alcohol use: Yes    Comment: rarely   Drug use: No   Sexual activity: Not Currently  Other Topics Concern   Not on file  Social History Narrative   10/03/19 Lives with dgtr, [name] and son-in-law    Retired, widowed   Education- high  school   Caffeine 1 cup daily   Social Determinants of Health   Financial Resource Strain: Not on file  Food Insecurity: Not on file   Transportation Needs: Not on file  Physical Activity: Not on file  Stress: Not on file  Social Connections: Not on file  Intimate Partner Violence: Not on file    Physical Exam: Vital signs in last 24 hours: BP (!) 113/51   Pulse 89   Temp (!) 97 F (36.1 C)   Ht '5\' 2"'$  (1.575 m)   Wt 132 lb (59.9 kg)   SpO2 97%   BMI 24.14 kg/m  GEN: NAD EYE: Sclerae anicteric ENT: MMM CV: Non-tachycardic Pulm: No increased WOB GI: Soft NEURO:  Alert & Oriented   Christia Reading, MD [facility]   03/05/2022 8:11 AM

## 2022-03-05 NOTE — Patient Instructions (Signed)
Thank you for coming in to see [place] today. Resume previous diet and medications today. Return to normal daily activities tomorrow. Biopsy result will be available in 1-2 weeks.   YOU HAD AN ENDOSCOPIC PROCEDURE TODAY AT [facility]:   Refer to the procedure report that was given to you for any specific questions about what was found during the examination.  If the procedure report does not answer your questions, please call your gastroenterologist to clarify.  If you requested that your care partner not be given the details of your procedure findings, then the procedure report has been included in a sealed envelope for you to review at your convenience later.  YOU SHOULD EXPECT: Some feelings of bloating in the abdomen. Passage of more gas than usual.  Walking can help get rid of the air that was put into your GI tract during the procedure and reduce the bloating. If you had a lower endoscopy (such as a colonoscopy or flexible sigmoidoscopy) you may notice spotting of blood in your stool or on the toilet paper. If you underwent a bowel prep for your procedure, you may not have a normal bowel movement for a few days.  Please Note:  You might notice some irritation and congestion in your nose or some drainage.  This is from the oxygen used during your procedure.  There is no need for concern and it should clear up in a day or so.  SYMPTOMS TO REPORT IMMEDIATELY:  Following lower endoscopy (colonoscopy or flexible sigmoidoscopy):  Excessive amounts of blood in the stool  Significant tenderness or worsening of abdominal pains  Swelling of the abdomen that is new, acute  Fever of 100F or higher   For urgent or emergent issues, a gastroenterologist can be reached at any hour by calling [phone]. Do not use MyChart messaging for urgent concerns.    DIET:  We do recommend a small meal at first, but then you may proceed to your regular diet.  Drink plenty of fluids but you  should avoid alcoholic beverages for 24 hours.  ACTIVITY:  You should plan to take it easy for the rest of today and you should NOT DRIVE or use heavy machinery until tomorrow (because of the sedation medicines used during the test).    FOLLOW UP: Our staff will call the number listed on your records 48-72 hours following your procedure to check on you and address any questions or concerns that you may have regarding the information given to you following your procedure. If we do not reach you, we will leave a message.  We will attempt to reach you two times.  During this call, we will ask if you have developed any symptoms of COVID 19. If you develop any symptoms (ie: fever, flu-like symptoms, shortness of breath, cough etc.) before then, please call [phone].  If you test positive for Covid 19 in the 2 weeks post procedure, please call and report this information to [place].    If any biopsies were taken you will be contacted by phone or by letter within the next 1-3 weeks.  Please call us at [phone] if you have not heard about the biopsies in 3 weeks.    SIGNATURES/CONFIDENTIALITY: You and/or your care partner have signed paperwork which will be entered into your electronic medical record.  These signatures attest to the fact that that the information above on your After Visit Summary has been reviewed and is understood.  Full responsibility of  the confidentiality of this discharge information lies with you and/or your care-partner.

## 2022-03-05 NOTE — Progress Notes (Signed)
Report to pacu rn; vss. ?

## 2022-03-06 ENCOUNTER — Telehealth: Payer: Self-pay | Admitting: *Deleted

## 2022-03-06 NOTE — Telephone Encounter (Signed)
  Follow up Call-     03/05/2022    7:58 AM 03/05/2022    7:51 AM 07/16/2021    2:13 PM  Call back number  Post procedure Call Back phone  # [phone]  Permission to leave phone message Yes Yes Yes     Patient questions:  Do you have a fever, pain , or abdominal swelling? No. Pain Score  0 *  Have you tolerated food without any problems? Yes.    Have you been able to return to your normal activities? Yes.    Do you have any questions about your discharge instructions: Diet   No. Medications  No. Follow up visit  No.  Do you have questions or concerns about your Care? No.  Actions: * If pain score is 4 or above: No action needed, pain <4.

## 2022-03-10 ENCOUNTER — Encounter: Payer: Self-pay | Admitting: Internal Medicine

## 2022-03-20 DIAGNOSIS — M5412 Radiculopathy, cervical region: Secondary | ICD-10-CM | POA: Diagnosis not present

## 2022-03-30 DIAGNOSIS — M25511 Pain in right shoulder: Secondary | ICD-10-CM | POA: Diagnosis not present

## 2022-03-30 DIAGNOSIS — M542 Cervicalgia: Secondary | ICD-10-CM | POA: Diagnosis not present

## 2022-03-30 DIAGNOSIS — I10 Essential (primary) hypertension: Secondary | ICD-10-CM | POA: Diagnosis not present

## 2022-03-30 DIAGNOSIS — M5412 Radiculopathy, cervical region: Secondary | ICD-10-CM | POA: Diagnosis not present

## 2022-04-10 ENCOUNTER — Ambulatory Visit: Payer: Medicare Other | Admitting: Internal Medicine

## 2022-04-10 ENCOUNTER — Encounter: Payer: Self-pay | Admitting: Internal Medicine

## 2022-04-10 VITALS — BP 118/72 | HR 86 | Ht 62.0 in | Wt 133.2 lb

## 2022-04-10 DIAGNOSIS — K5900 Constipation, unspecified: Secondary | ICD-10-CM | POA: Diagnosis not present

## 2022-04-17 DIAGNOSIS — I10 Essential (primary) hypertension: Secondary | ICD-10-CM | POA: Diagnosis not present

## 2022-04-17 DIAGNOSIS — E039 Hypothyroidism, unspecified: Secondary | ICD-10-CM | POA: Diagnosis not present

## 2022-04-17 DIAGNOSIS — E782 Mixed hyperlipidemia: Secondary | ICD-10-CM | POA: Diagnosis not present

## 2022-04-17 DIAGNOSIS — F3342 Major depressive disorder, recurrent, in full remission: Secondary | ICD-10-CM | POA: Diagnosis not present

## 2022-04-20 ENCOUNTER — Ambulatory Visit: Payer: Medicare Other | Admitting: Family Medicine

## 2022-04-20 DIAGNOSIS — M542 Cervicalgia: Secondary | ICD-10-CM | POA: Diagnosis not present

## 2022-04-20 DIAGNOSIS — M5412 Radiculopathy, cervical region: Secondary | ICD-10-CM | POA: Diagnosis not present

## 2022-04-20 DIAGNOSIS — Z7409 Other reduced mobility: Secondary | ICD-10-CM | POA: Diagnosis not present

## 2022-04-27 ENCOUNTER — Encounter: Payer: Self-pay | Admitting: Family Medicine

## 2022-04-27 ENCOUNTER — Ambulatory Visit: Payer: Medicare Other | Admitting: Family Medicine

## 2022-04-27 ENCOUNTER — Other Ambulatory Visit: Payer: Self-pay | Admitting: Internal Medicine

## 2022-04-27 VITALS — BP 112/57 | HR 86 | Ht 62.0 in | Wt 135.0 lb

## 2022-04-27 DIAGNOSIS — G40109 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, not intractable, without status epilepticus: Secondary | ICD-10-CM

## 2022-04-27 NOTE — Patient Instructions (Signed)
Below is our plan:  We will continue zonisamide '200mg'$  and lacosamide '50mg'$  twice daily.   Please make sure you are staying well hydrated. I recommend 50-60 ounces daily. Well balanced diet and regular exercise encouraged. Consistent sleep schedule with 6-8 hours recommended.   Please continue follow up with care team as directed.   Follow up with me in 1 year   You may receive a survey regarding today's visit. I encourage you to leave honest feed back as I do use this information to improve patient care. Thank you for seeing me today!

## 2022-04-27 NOTE — Progress Notes (Signed)
Chief Complaint  Patient presents with   Follow-up    Rm 17, alone. Here for sz f/u. Has an MRI sch for tomorrow. No sz like activity since last OV. Pt would like to discuss medications. PCP has suggested she d/c desvenlafaxine ER, famotidine, metoprolol succinate, and sertraline. Pt c/o of dry cough for the last month, worse at night.     HISTORY OF PRESENT ILLNESS:  04/27/22 ALL: Suzanne Galloway returns for follow up for seizures. She continues zonisamide '200mg'$  and lacosamide '50mg'$  BID. She reports that she is doing well. She reports deja vu episodes have decreased significantly. EEG was normal. No obvious seizure activity. She was seen by neuropsychology and workup was unremarkable. Per Dr Melvyn Novas report " results suggested neuropsychological functioning largely within normal limits relative to age-matched peers. She exhibited deficits across two isolated tasks. These included response inhibition and visuoconstructional abilities when copying a complex figure. However, all other tasks assessing executive functioning and visuospatial/constructional abilities were appropriate."   10/21/2021 ALL: Suzanne Galloway returns for follow up for seizures. She continues zonisamide '200mg'$  at bedtime and lacosamide '50mg'$  BID. No recent seizures. Last event 2018. Deja vu episodes are worsening. She reports they occur once daily. She mentions that her daughter has expressed concerns of her "spacing out". She will look off into space and does not seem like she is aware of her surroundings. This has been worse over the past month after starting new depression medication.  She continues to have short term memory concerns. She can't remember tasks she needs to complete later in the day. She continues to follow closely with PCP for depression. She was recently started on Wellbutrin but discontinued due to increased irritability and insomnia. She resumed Pristiq '50mg'$  daily. She does not sleep well. She wakes in the middle of the night and can  not go back to sleep. She is unsure if she snores. She reports having a sleep study "years ago" but thinks it was normal. No significant dry mouth or morning headaches.   She lives with her daughter, Warren Lacy, and son in law. She takes care of her own fiances. She is retired. 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 She is able to perform all ADLs independently. She manages her own medications. She drives on occasion but states that her daughter usually does the driving.   02/18/2021 VP: UPDATE (02/18/21, VRP): Since last visit, doing well except this past weekend. More stress, anxiety, depression, was in process of changing pristiq to zoloft, then on 'Sunday was at home, stood up, felt lightheaded. Then woozy, felts large desynchronized movements in arms and legs, laid on to bed. Was awake and aware through the event, 1 minute. Then tired, but back to baseline within 10-15 minutes. Had dinner with family and they did not notice any issues (patient did not tell them about the event). Deja vu spells stable.     12'$ /27/2021 ALL: Suzanne Galloway is a 81 y.o. female here today for follow up for seizures. She continues zonisamide '200mg'$  at bedtime and [name] '50mg'$  BID. Last major seizure in 09/2017. She has continued to have deja vu spells but feels they are much less frequent than they were. She may have a spell 3-4 times a year. She continues escitalopram '20mg'$  daily and alprazolam 0.'25mg'$  TID. Cardiac stent placed post STEMI in 03/10/2020. She contracted Covid over the summer which delayed cardiac rehab. She recently completed cardiac rehab. She continues to have concerns of memory loss. She has trouble finding  words. She uses words inappropriately but immediately recognizes it. Her daughter is with her today and tells a story where Mrs Kuch described a 64 second event where she was confused on 12/24. She asked herself "where was Remo Lipps", then asked herself "who is Remo Lipps" and then realized that Remo Lipps was standing in the room  with her. Remo Lipps is her grandson. She denies similar events happening since or before this event. She lives with her youngest daughter and son in [employer]. She manages her own finances. She doses her own medications. She is able to perform ADL's She is sleeping well. She feels mood is stable. She tried to wean alprazolam but feels anxiety worsened. She is scheduled to see a psychologist in January.   She is fully vaccinated with booster for Covid and flu.    HISTORY (copied from previous note)  UPDATE (10/03/19, VRP): Since last visit, more stress and tragedy from her friends medical issues. More panic and anxiety and stomach issues. Deja vu spells continue, slightly more so patient increased her zonisamide. Symptoms are otherwise stable.    UPDATE (05/29/19, VRP): Since last visit, doing poorly according to family. Continues with depression and anxiety. Severity is moderate. No alleviating or aggravating factors. Tolerating meds. Weekly deja vu spells. No major seizures. Some intermittent memory loss.    UPDATE (08/10/18, VRP): Since last visit, struggling with anxiety and depression sxs. No def sz except mild deja vu spell 1 week ago. No alleviating or aggravating factors. Tolerating meds. Psychiatry has increased lexapro 1 month ago.   PRIOR HPI (02/01/18): 81 year old female here for evaluation of seizures.   01/24/12 was a stressful year for patient.  Patient was taking care of her husband who ultimately passed away.  Then in 01/23/13 she began to have intermittent episodes of dj vu, anxiety, hot sensation in her body, a liquid sensation going down her chest and abdomen.  Episodes can last minutes at a time.  These occurred every few weeks.  Over time the increased in frequency.  These were attributed to anxiety and patient was treated with Lexapro, Wellbutrin and Ativan.   In 01/24/15 patient was evaluated by neurologist for tremor.  She also reported episodes of dj vu and therefore EEG was ordered.  No  epileptiform discharges were found.  This is followed up with ambulatory EEG which was also normal.   May 2018 patient presented to the hospital with retrograde memory loss, patient thinking it was 01-24-2015, no recent memory of a beach trip recently, and other confusion.  Hospital EEG showed occasional sharp wave discharges in the right posterior frontal region.  Patient was then referred to [facility] for video EEG monitoring.  Occasional right temporal sharp wave activity was noted.  Patient had 2 electrographic seizures.  Patient was started on Lamictal but then this was discontinued due to lack of effectiveness and possible side effect.  This was changed to oxcarbazepine but this caused paranoia and confusion.  Patient was then started on zonisamide.  Patient developed some tremor which was initially thought to be anxiety related, but then possibly related to zonisamide, and therefore dose was slightly reduced.   Last event of possible seizure occurred 10/13/17, with memory lapse, confusion, dj vu sensation, anxiety sensation.   Patient currently on zonisamide 200 mg at bedtime for seizure prevention.  No significant tremors.   She is also on Lexapro 20 mg/day for anxiety and depression.  She is on lorazepam 0.5 mg twice a day for anxiety.  Patient still  feels quite anxious and depressed at times, related to being stuck at home.  However when she is able to get out of the house she feels like going back home.  She is working with a Teacher, music and psychologist now.   Patient then requested a second opinion with our office.   REVIEW OF SYSTEMS: Out of a complete 14 system review of symptoms, the patient complains only of the following symptoms, memory loss, anxiety and all other reviewed systems are negative.   ALLERGIES: Allergies  Allergen Reactions   Codeine Other (See Comments)    "makes me crazy"   Lamictal [Lamotrigine] Other (See Comments)    Increased siezure activity.    Oxcarbazepine Other (See Comments)    Over sedation   Symbicort [Budesonide-Formoterol Fumarate] Other (See Comments)    Had 2 "seizures" after taking.    Atorvastatin    Buspirone     Increased depression   Carbamazepine Other (See Comments)    Paranoia, confusion   Depakote [Divalproex Sodium] Nausea And Vomiting    lethargy   Dilaudid [Hydromorphone] Other (See Comments)    Reaction:  Hallucinations    Dulera [Mometasone Furo-Formoterol Fum] Other (See Comments)    Reaction:  Deja vu seizures    Lactose Intolerance (Gi) Diarrhea   Morphine And Related Nausea And Vomiting   Percocet [Oxycodone-Acetaminophen] Other (See Comments)    Reaction:  Hallucinations    Pravastatin Other (See Comments)    Reaction:  Muscle pain, "weird dreams"   Vesicare [Solifenacin] Other (See Comments)    Reaction:  Constipation    Zonisamide Other (See Comments)    At higher doses can cause tremors, anxiety     HOME MEDICATIONS: Outpatient Medications Prior to Visit  Medication Sig Dispense Refill   albuterol (VENTOLIN HFA) 108 (90 Base) MCG/ACT inhaler Inhale 1 puff into the lungs every 4 (four) hours.     AMBULATORY NON FORMULARY MEDICATION 90 ml Viscous Lidocaine , 90 ml '10mg'$ /21m Dicyclomine , 270 ml Malox. 450 mL 0   Ascorbic Acid (VITAMIN C PO) Take 1 tablet by mouth daily.     aspirin 81 MG chewable tablet Chew 81 mg by mouth in the morning and at bedtime.      atorvastatin (LIPITOR) 80 MG tablet Take 80 mg by mouth daily.     benzonatate (TESSALON) 200 MG capsule Take 200 mg by mouth 3 (three) times daily as needed.     Calcium Carbonate-Vitamin D 600-400 MG-UNIT tablet Take 1 tablet by mouth daily.     desvenlafaxine (PRISTIQ) 50 MG 24 hr tablet Take 50 mg by mouth 2 (two) times daily.     famotidine (PEPCID) 40 MG tablet Take 40 mg by mouth 2 (two) times daily.     ferrous sulfate 325 (65 FE) MG EC tablet 1 tablet daily.     lacosamide ([name]) 50 MG TABS tablet TAKE ONE TABLET BY MOUTH  TWICE DAILY 180 tablet 1   levothyroxine (SYNTHROID) 75 MCG tablet Take 75 mcg by mouth daily before breakfast.     metoprolol succinate (TOPROL-XL) 25 MG 24 hr tablet Take 25 mg by mouth daily.     Multiple Vitamin (MULTIVITAMIN ADULT) TABS 1 tablet daily.     Multiple Vitamins-Minerals (PRESERVISION AREDS PO) Take 1 tablet by mouth daily.      nitroGLYCERIN (NITROSTAT) 0.4 MG SL tablet Place 0.4 mg under the tongue every 5 (five) minutes as needed for chest pain.     pantoprazole (PROTONIX) 40 MG tablet  Take 1 tablet (40 mg total) by mouth 2 (two) times daily before a meal. 180 tablet 1   Polyethyl Glycol-Propyl Glycol (SYSTANE OP) Apply 1 drop to eye daily as needed.     polyethylene glycol powder (GLYCOLAX/MIRALAX) 17 GM/SCOOP powder Dissolve 1 capful in 8 ounces of fluid and drink daily 255 g 3   sacubitril-valsartan (ENTRESTO) 24-26 MG Take 1 tablet by mouth 2 (two) times daily.      spironolactone (ALDACTONE) 25 MG tablet Take 25 mg by mouth daily.     tiZANidine (ZANAFLEX) 2 MG tablet Take 1-2 mg by mouth at bedtime as needed.     traMADol (ULTRAM) 50 MG tablet Take 25 mg by mouth every 6 (six) hours as needed.     vitamin B-12 (CYANOCOBALAMIN) 500 MCG tablet 1 tablet daily.     zonisamide (ZONEGRAN) 100 MG capsule Take 2 capsules (200 mg total) by mouth at bedtime. 180 capsule 4   isosorbide mononitrate (IMDUR) 30 MG 24 hr tablet Take 30 mg by mouth daily.     loperamide (IMODIUM A-D) 2 MG tablet Take 2 mg by mouth 4 (four) times daily as needed for diarrhea or loose stools.     No facility-administered medications prior to visit.     PAST MEDICAL HISTORY: Past Medical History:  Diagnosis Date   Arthritis    "hands, back, toes" (01/01/2015)   Asthma, uncomplicated    Atrophic gastritis    Atypical chest pain 03/20/2020   Bereavement due to life event    Bronchitis, acute 07/09/2017   Coronary artery disease involving native coronary artery of native heart without angina  pectoris 03/10/2020   S/P STEMI 03/10/20, DES LAD, VFib Arrest   Degenerative disc disease, thoracic    Dysphagia    Essential hypertension 01/01/2015   Exertional shortness of breath 12/28/2014   GERD (gastroesophageal reflux disease)    Greater trochanteric pain syndrome 11/12/2017   Hearing loss    hearing aids   Hemorrhoids    Hiatal hernia    Hyperlipemia    Hypothyroidism    IBS (irritable bowel syndrome)    Impaired fasting glucose    Ischemic cardiomyopathy 03/10/2020   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, not intractable, without status epilepticus 05/12/2017   Localized, primary osteoarthritis of hand 12/16/2021   Mitral valve prolapse    Mixed hyperlipidemia 01/01/2015   Myocardial infarction 03/09/2020   Overactive bladder    Schatzki's ring    Status post insertion of drug eluting coronary artery stent 03/10/2020   Tubular adenoma of colon    Urinary incontinence      PAST SURGICAL HISTORY: Past Surgical History:  Procedure Laterality Date   BACK SURGERY     CARDIAC CATHETERIZATION     CARPAL TUNNEL RELEASE Bilateral 2008   CYSTECTOMY Right 09/2014   "hand"    DILATION AND CURETTAGE OF UTERUS     FINGER SURGERY Bilateral    "scraped arthritis out of thumbs"   [place]; 12/2009   ; Archie Endo 01/16/2010   TOE SURGERY Left 09/21/2005   "took bone out of 2nd toe; took cyst out"   TUBAL LIGATION     VAGINAL HYSTERECTOMY  1970     FAMILY HISTORY: Family History  Problem Relation Age of Onset   Parkinson's disease Mother    Heart failure Mother    Diabetes Mother  COPD Father        smoked   Heart disease Father    Esophageal cancer Father        smoked   Transient ischemic attack Father    Cancer Father        kidney   Deep vein thrombosis Brother    Diabetes Maternal Grandmother    Diabetes  Paternal Grandfather    Cancer Daughter    Colon polyps Daughter    Alzheimer's disease Cousin        2 maternal cousins   Colon cancer Neg Hx    Stomach cancer Neg Hx      SOCIAL HISTORY: Social History   Socioeconomic History   Marital status: Widowed    Spouse name: Not on file   Number of children: 3   Years of education: 10   Highest education level: GED or equivalent  Occupational History   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  Tobacco Use   Smoking status: Never   Smokeless tobacco: Never  Vaping Use   Vaping Use: Never used  Substance and Sexual Activity   Alcohol use: Yes    Comment: rarely   Drug use: No   Sexual activity: Not Currently  Other Topics Concern   Not on file  Social History Narrative   10/03/19 Lives with dgtr, Eino Whitner and son-in-law    Retired, widowed   Education- high school   Caffeine 1 cup daily   Social Determinants of Health   Financial Resource Strain: Not on file  Food Insecurity: No Food Insecurity (12/31/2020)   Hunger Vital Sign    Worried About Running Out of Food in the Last Year: Never true    Ran Out of Food in the Last Year: Never true  Transportation Needs: No Transportation Needs (12/31/2020)   PRAPARE - Hydrologist (Medical): No    Lack of Transportation (Non-Medical): No  Physical Activity: Not on file  Stress: Not on file  Social Connections: Not on file  Intimate Partner Violence: Not on file      PHYSICAL EXAM  Vitals:   04/27/22 1101  BP: (!) 112/57  Pulse: 86  Weight: 135 lb (61.2 kg)  Height: '5\' 2"'$  (1.575 m)    Body mass index is 24.69 kg/m.   Generalized: Well developed, in no acute distress  Cardiology: normal rate and rhythm, no murmur auscultated  Respiratory: clear to auscultation bilaterally    Neurological examination  Mentation: Alert oriented to time, place, history taking. Follows all commands speech and language fluent Cranial nerve  II-XII: Pupils were equal round reactive to light. Extraocular movements were full, visual field were full on confrontational test. Facial sensation and strength were normal. Head turning and shoulder shrug  were normal and symmetric. Motor: The motor testing reveals 5 over 5 strength of all 4 extremities. Good symmetric motor tone is noted throughout.  Sensory: Sensory testing is intact to soft touch on all 4 extremities. No evidence of extinction is noted.  Coordination: Cerebellar testing reveals good finger-nose-finger and heel-to-shin bilaterally.  Gait and station: Gait is normal.  Reflexes: Deep tendon reflexes are symmetric and normal bilaterally.     DIAGNOSTIC DATA (LABS, IMAGING, TESTING) - I reviewed patient records, labs, notes, testing and imaging myself where available.  Lab Results  Component Value Date   WBC 7.3 02/19/2017   HGB 12.5 02/19/2017   HCT 37.3 02/19/2017   MCV 92.1 02/19/2017  PLT 260 02/19/2017      Component Value Date/Time   NA 138 02/19/2017 0935   K 4.6 02/19/2017 0935   CL 101 02/19/2017 0935   CO2 28 02/19/2017 0935   GLUCOSE 112 (H) 02/19/2017 0935   BUN 14 02/19/2017 0935   CREATININE 0.87 02/19/2017 0935   CREATININE 0.82 12/27/2014 0714   CALCIUM 9.4 02/19/2017 0935   PROT 7.4 02/19/2017 0935   ALBUMIN 4.6 02/19/2017 0935   AST 28 02/19/2017 0935   ALT 21 02/19/2017 0935   ALKPHOS 69 02/19/2017 0935   BILITOT 0.4 02/19/2017 0935   GFRNONAA >60 02/19/2017 0935   GFRAA >60 02/19/2017 0935   Lab Results  Component Value Date   CHOL 174 01/02/2015   HDL 37 (L) 01/02/2015   LDLCALC 105 (H) 01/02/2015   TRIG 158 (H) 01/02/2015   CHOLHDL 4.7 01/02/2015   Lab Results  Component Value Date   HGBA1C 6.3 (H) 01/03/2015   Lab Results  Component Value Date   VITAMINB12 312 02/20/2017   Lab Results  Component Value Date   TSH 10.174 (H) 02/20/2017       10/21/2021   10:53 AM 10/14/2020   10:32 AM 10/03/2019   10:45 AM  MMSE -  Mini Mental State Exam  Orientation to time '5 5 4  '$ Orientation to Place '5 5 4  '$ Registration '3 3 3  '$ Attention/ Calculation '3 1 1  '$ Recall '3 3 3  '$ Language- name 2 objects '2 2 2  '$ Language- repeat '1 1 1  '$ Language- follow 3 step command '3 3 3  '$ Language- read & follow direction '1 1 1  '$ Write a sentence '1 1 1  '$ Copy design '1 1 1  '$ Copy design-comments  9 animals   Total score '28 26 24     '$ ASSESSMENT AND PLAN  82 y.o. year old female  has a past medical history of Arthritis, Asthma, uncomplicated, Atrophic gastritis, Atypical chest pain (03/20/2020), Bereavement due to life event, Bronchitis, acute (07/09/2017), Coronary artery disease involving native coronary artery of native heart without angina pectoris (03/10/2020), Degenerative disc disease, thoracic, Dysphagia, Essential hypertension (01/01/2015), Exertional shortness of breath (12/28/2014), GERD (gastroesophageal reflux disease), Greater trochanteric pain syndrome (11/12/2017), Hearing loss, Hemorrhoids, Hiatal hernia, Hyperlipemia, Hypothyroidism, IBS (irritable bowel syndrome), Impaired fasting glucose, Ischemic cardiomyopathy (03/10/2020), 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, not intractable, without status epilepticus (05/12/2017), Localized, primary osteoarthritis of hand (12/16/2021), Mitral valve prolapse, Mixed hyperlipidemia (01/01/2015), Myocardial infarction (03/09/2020), Overactive bladder, Schatzki's ring, Status post insertion of drug eluting coronary artery stent (03/10/2020), Tubular adenoma of colon, and Urinary incontinence. here with   Localization-related partial epilepsy with simple partial seizures ([place])  Jamesyn reports that she is doing well from a seizure standpoint. We will continue zonisamide 200 mg daily at bedtime and will lacosamide 50 mg twice daily. Neuropsych testing unremarkable. Memory compensation strategies reviewed. She was encouraged  to continue close follow-up with primary care for depression/anxiety management. Healthy lifestyle habits encouraged.  She will follow-up with [place] in 1 year, sooner if needed.  She verbalizes understanding and agreement with this plan.   No orders of the defined types were placed in this encounter.    Debbora Presto, MSN, FNP-C 04/27/2022, 11:57 AM  [facility] [address] [place], [place] [address] [phone]

## 2022-04-28 DIAGNOSIS — M50122 Cervical disc disorder at C5-C6 level with radiculopathy: Secondary | ICD-10-CM | POA: Diagnosis not present

## 2022-04-28 DIAGNOSIS — M5412 Radiculopathy, cervical region: Secondary | ICD-10-CM | POA: Diagnosis not present

## 2022-04-28 DIAGNOSIS — M50123 Cervical disc disorder at C6-C7 level with radiculopathy: Secondary | ICD-10-CM | POA: Diagnosis not present

## 2022-04-28 DIAGNOSIS — M5011 Cervical disc disorder with radiculopathy,  high cervical region: Secondary | ICD-10-CM | POA: Diagnosis not present

## 2022-04-28 DIAGNOSIS — Z7409 Other reduced mobility: Secondary | ICD-10-CM | POA: Diagnosis not present

## 2022-04-28 DIAGNOSIS — M542 Cervicalgia: Secondary | ICD-10-CM | POA: Diagnosis not present

## 2022-04-28 DIAGNOSIS — M50121 Cervical disc disorder at C4-C5 level with radiculopathy: Secondary | ICD-10-CM | POA: Diagnosis not present

## 2022-04-29 DIAGNOSIS — E611 Iron deficiency: Secondary | ICD-10-CM | POA: Diagnosis not present

## 2022-04-29 DIAGNOSIS — I10 Essential (primary) hypertension: Secondary | ICD-10-CM | POA: Diagnosis not present

## 2022-05-05 DIAGNOSIS — Z7409 Other reduced mobility: Secondary | ICD-10-CM | POA: Diagnosis not present

## 2022-05-05 DIAGNOSIS — M5412 Radiculopathy, cervical region: Secondary | ICD-10-CM | POA: Diagnosis not present

## 2022-05-05 DIAGNOSIS — M542 Cervicalgia: Secondary | ICD-10-CM | POA: Diagnosis not present

## 2022-05-11 ENCOUNTER — Encounter: Payer: Self-pay | Admitting: Internal Medicine

## 2022-05-11 DIAGNOSIS — R300 Dysuria: Secondary | ICD-10-CM | POA: Diagnosis not present

## 2022-05-12 DIAGNOSIS — M5412 Radiculopathy, cervical region: Secondary | ICD-10-CM | POA: Diagnosis not present

## 2022-05-12 DIAGNOSIS — M542 Cervicalgia: Secondary | ICD-10-CM | POA: Diagnosis not present

## 2022-05-12 DIAGNOSIS — Z7409 Other reduced mobility: Secondary | ICD-10-CM | POA: Diagnosis not present

## 2022-05-19 DIAGNOSIS — I10 Essential (primary) hypertension: Secondary | ICD-10-CM | POA: Diagnosis not present

## 2022-05-19 DIAGNOSIS — M5030 Other cervical disc degeneration, unspecified cervical region: Secondary | ICD-10-CM | POA: Diagnosis not present

## 2022-05-19 DIAGNOSIS — M4722 Other spondylosis with radiculopathy, cervical region: Secondary | ICD-10-CM | POA: Diagnosis not present

## 2022-05-19 DIAGNOSIS — M4802 Spinal stenosis, cervical region: Secondary | ICD-10-CM | POA: Diagnosis not present

## 2022-05-26 DIAGNOSIS — M5412 Radiculopathy, cervical region: Secondary | ICD-10-CM | POA: Diagnosis not present

## 2022-05-26 DIAGNOSIS — M542 Cervicalgia: Secondary | ICD-10-CM | POA: Diagnosis not present

## 2022-05-26 DIAGNOSIS — Z7409 Other reduced mobility: Secondary | ICD-10-CM | POA: Diagnosis not present

## 2022-06-09 DIAGNOSIS — E039 Hypothyroidism, unspecified: Secondary | ICD-10-CM | POA: Diagnosis not present

## 2022-06-09 DIAGNOSIS — R531 Weakness: Secondary | ICD-10-CM | POA: Diagnosis not present

## 2022-06-09 DIAGNOSIS — D649 Anemia, unspecified: Secondary | ICD-10-CM | POA: Diagnosis not present

## 2022-06-16 ENCOUNTER — Telehealth: Payer: Self-pay | Admitting: Internal Medicine

## 2022-06-16 NOTE — Telephone Encounter (Signed)
Patient called and states she would like to stop taking pantoprazole, please advise.

## 2022-06-17 NOTE — Telephone Encounter (Signed)
Patient would like to stop the Pantoprazole and see how she does. She denies any GERD symptoms. She wants a trial off the PPI due to her unexplained fatigue and her concern about being on PPI for extended time.  Is it okay to stop the pantoprazole?

## 2022-06-17 NOTE — Telephone Encounter (Signed)
Patient instructed.

## 2022-06-29 ENCOUNTER — Telehealth: Payer: Self-pay | Admitting: Internal Medicine

## 2022-06-29 ENCOUNTER — Other Ambulatory Visit: Payer: Self-pay

## 2022-06-29 MED ORDER — PANTOPRAZOLE SODIUM 40 MG PO TBEC: 40.0000 mg | DELAYED_RELEASE_TABLET | Freq: Two times a day (BID) | ORAL | 1 refills | Status: AC

## 2022-06-29 NOTE — Telephone Encounter (Signed)
Patient reports that she found she cannot do without the pantoprazole. She would like to resume it. SShe had the pharmacy cancel the previous prescription. The pharmacist said she will need a new prescription. She reports she did feel a slight increase in her level of energy once she had discontinued pantoprazole, "but it is not worth it." [facility] prescription transmitted to [facility]

## 2022-06-29 NOTE — Telephone Encounter (Signed)
Inbound call from patient stating that she would like to start taking pantoprazole  40 mg again. Patient stated if she can start taking medication again, the pharmacy will need a new prescription. Patient is requesting a call back to discuss if she can start medication again. Please advise.

## 2022-07-08 DIAGNOSIS — M533 Sacrococcygeal disorders, not elsewhere classified: Secondary | ICD-10-CM | POA: Diagnosis not present

## 2022-07-08 DIAGNOSIS — M542 Cervicalgia: Secondary | ICD-10-CM | POA: Diagnosis not present

## 2022-07-08 DIAGNOSIS — M5412 Radiculopathy, cervical region: Secondary | ICD-10-CM | POA: Diagnosis not present

## 2022-07-13 ENCOUNTER — Other Ambulatory Visit: Payer: Self-pay

## 2022-07-13 DIAGNOSIS — G40109 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, not intractable, without status epilepticus: Secondary | ICD-10-CM

## 2022-07-13 MED ORDER — LACOSAMIDE 50 MG PO TABS: ORAL_TABLET | ORAL | 1 refills | Status: DC

## 2022-07-13 NOTE — Telephone Encounter (Signed)
Pt has been seen recently and been checked in the registry.

## 2022-07-14 DIAGNOSIS — N3000 Acute cystitis without hematuria: Secondary | ICD-10-CM | POA: Diagnosis not present

## 2022-07-15 ENCOUNTER — Other Ambulatory Visit: Payer: Self-pay | Admitting: Family Medicine

## 2022-07-15 DIAGNOSIS — K449 Diaphragmatic hernia without obstruction or gangrene: Secondary | ICD-10-CM | POA: Diagnosis not present

## 2022-07-15 DIAGNOSIS — I25119 Atherosclerotic heart disease of native coronary artery with unspecified angina pectoris: Secondary | ICD-10-CM | POA: Diagnosis not present

## 2022-07-15 DIAGNOSIS — R079 Chest pain, unspecified: Secondary | ICD-10-CM | POA: Diagnosis not present

## 2022-07-15 MED ORDER — LACOSAMIDE 50 MG PO TABS: 50.0000 mg | ORAL_TABLET | Freq: Two times a day (BID) | ORAL | 1 refills | Status: DC

## 2022-07-15 NOTE — Telephone Encounter (Signed)
The refill appears was completed on Monday but it printed instead of escribed. Will send to [name] to send for the patient.

## 2022-07-15 NOTE — Telephone Encounter (Signed)
Pt is requesting a refill for lacosamide ([name]) 50 MG TABS tablet.  Pharmacy: [id number]

## 2022-07-15 NOTE — Addendum Note (Signed)
Addended by: Darleen Crocker on: 07/15/2022 04:10 PM   Modules accepted: Orders

## 2022-07-17 DIAGNOSIS — M5030 Other cervical disc degeneration, unspecified cervical region: Secondary | ICD-10-CM | POA: Diagnosis not present

## 2022-07-17 DIAGNOSIS — M4802 Spinal stenosis, cervical region: Secondary | ICD-10-CM | POA: Diagnosis not present

## 2022-07-21 ENCOUNTER — Ambulatory Visit: Payer: Medicare Other | Attending: Internal Medicine

## 2022-07-21 DIAGNOSIS — E039 Hypothyroidism, unspecified: Secondary | ICD-10-CM | POA: Insufficient documentation

## 2022-07-21 DIAGNOSIS — E7800 Pure hypercholesterolemia, unspecified: Secondary | ICD-10-CM | POA: Insufficient documentation

## 2022-07-21 DIAGNOSIS — D1771 Benign lipomatous neoplasm of kidney: Secondary | ICD-10-CM | POA: Diagnosis not present

## 2022-07-21 DIAGNOSIS — D3001 Benign neoplasm of right kidney: Secondary | ICD-10-CM | POA: Diagnosis not present

## 2022-07-21 DIAGNOSIS — I700 Atherosclerosis of aorta: Secondary | ICD-10-CM | POA: Diagnosis not present

## 2022-07-21 DIAGNOSIS — N2889 Other specified disorders of kidney and ureter: Secondary | ICD-10-CM | POA: Diagnosis not present

## 2022-07-21 DIAGNOSIS — C649 Malignant neoplasm of unspecified kidney, except renal pelvis: Secondary | ICD-10-CM | POA: Diagnosis not present

## 2022-07-21 LAB — LIPID PANEL
Cholesterol: 198 mg/dL (ref <200)
HDL Cholesterol: 64 mg/dL (ref >40)
LDL Cholesterol Calculation: 115 mg/dL — ABNORMAL HIGH (ref <100)
Non-HDL Cholesterol: 134 mg/dL (ref <=150)
Total Cholesterol: HDL Ratio: 3.1 (ref <4.0)
Triglyceride: 97 mg/dL (ref <150)

## 2022-07-21 LAB — COMPREHENSIVE METABOLIC PANEL
Alanine Transferase (ALT): 16 U/L (ref <=33)
Albumin: 3.8 g/dL — ABNORMAL LOW (ref 4.0–4.9)
Alkaline Phosphatase (ALP): 76 U/L (ref 35–129)
Anion Gap: 9 mmol/L (ref 7–15)
Aspartate Transaminase (AST): 23 U/L (ref <=41)
Bilirubin Total: 0.8 mg/dL (ref <=1.2)
Calcium: 8.6 mg/dL (ref 8.6–10.0)
Carbon Dioxide Total: 27 mmol/L (ref 22–29)
Chloride: 104 mmol/L (ref 98–107)
Creatinine Serum: 0.77 mg/dL (ref 0.51–1.17)
E-GFR Creatinine (Female): 78 mL/min/{1.73_m2}
Glucose: 87 mg/dL (ref 74–109)
Potassium: 4.3 mmol/L (ref 3.4–5.1)
Protein: 5.9 g/dL — ABNORMAL LOW (ref 6.6–8.7)
Sodium: 140 mmol/L (ref 136–145)
Urea Nitrogen, Blood (BUN): 17 mg/dL (ref 6–20)

## 2022-07-21 LAB — THYROID STIMULATING HORMONE: Thyroid Stimulating Hormone: 1.12 u[IU]/mL (ref 0.27–4.20)

## 2022-07-27 ENCOUNTER — Encounter: Payer: Self-pay | Admitting: Internal Medicine

## 2022-07-27 ENCOUNTER — Ambulatory Visit: Payer: Medicare Other | Admitting: Internal Medicine

## 2022-07-27 VITALS — BP 134/67 | HR 65 | Temp 97.3°F | Resp 16 | Ht 66.0 in | Wt 166.5 lb

## 2022-07-27 DIAGNOSIS — E7800 Pure hypercholesterolemia, unspecified: Secondary | ICD-10-CM

## 2022-07-27 DIAGNOSIS — E039 Hypothyroidism, unspecified: Secondary | ICD-10-CM

## 2022-07-27 DIAGNOSIS — I10 Essential (primary) hypertension: Secondary | ICD-10-CM

## 2022-07-27 DIAGNOSIS — Z23 Encounter for immunization: Secondary | ICD-10-CM

## 2022-07-27 NOTE — Nursing Note (Signed)
Vital signs taken  Allergies verified  Screen for pain  Pharmacy verified   Patient accompanied by self

## 2022-07-27 NOTE — Progress Notes (Signed)
Chief Complaint: Patient presents today with   Follow-up on   Cholesterol    Tammy [name] If you are reviewing this progress note and have questions about the meaning or medical terms being used, please schedule an appointment or bring it up at your next follow-up appointment. Medical notes are meant to be a communication tool between medical professionals and require medical terms to be used for efficiency.      - Voice recognition software may have been used in portions of this report.   Inadvertent computerized transcription errors may be present despite efforts.    Portions of this note were dictated using [employer]. Omissions, "sound-alikes" and sentence fragmentation are unintentional.      Hyperlipidemia,    management discussed with patient, shehas  been working on  diet, she has  been doing regular exercise. Patient verbalised understanding about the significance for regular exercise and low fat diet in management of this problem.  Worried about her husband who has dementia and drinking alcohol    Lab Results   Lab Name Value Date/Time    CHOL 198 07/21/2022 08:06 AM    CHOL 188 05/09/2014 09:44 AM    LDLC 115 (H) 07/21/2022 08:06 AM    LDLC 107 05/09/2014 09:44 AM    HDL 64 07/21/2022 08:06 AM    HDL 59 05/09/2014 09:44 AM    TRIG 97 07/21/2022 08:06 AM    TRIG 108 05/09/2014 09:44 AM     Hypertension: Patient has been checking blood pressure  .she does  take medications regularly due to havaing  blood pressure  Current cardiovascular symptoms are none.   Her daughter moved back with her   Lab Results   Lab Name Value Date/Time    NA 140 07/21/2022 08:06 AM    NA 139 05/09/2014 09:44 AM    K 4.3 07/21/2022 08:06 AM    K 3.9 05/09/2014 09:44 AM    CL 104 07/21/2022 08:06 AM    CL 106 05/09/2014 09:44 AM    CO2 27 07/21/2022 08:06 AM    CO2 27 05/09/2014 09:44 AM    BUN 17 07/21/2022 08:06 AM    BUN 15 05/09/2014 09:44 AM    CR 0.77 07/21/2022 08:06 AM    CR 0.69 05/09/2014 09:44 AM    GLU 87 07/21/2022  08:06 AM    GLU 96 05/09/2014 09:44 AM         Ros  complete review of symptons was done all other systems are negative except as otherwise stated in HPI      Vs  BP 134/67 (SITE: right arm, Orthostatic Position: sitting, Cuff Size: regular)   Pulse 65   Temp 36.3 C (97.3 F) (Temporal)   Resp 16   Ht 1.676 m (5\' 6" )   Wt 75.5 kg (166 lb 8 oz)   SpO2 97%   BMI 26.87 kg/m         Physical exam -  General Appearance: healthy, alert, no distress, pleasant affect, cooperative.  Eyes:  conjunctivae and corneas clear. PERRL, EOM's intact. sclerae normal.  Ears:  external inspection of ears show no abnormality.  Lungs: no chest deformities noted .lungs clear to auscultation.  Heart:  normal rate and regular rhythm, no murmurs,  Extremities:  no cyanosis, clubbing, or edema.    ASSESMENT AND PLAN     (Z23) Need for influenza vaccination  (primary encounter diagnosis)  Comment:   Plan: INFLUENZA, HIGH-DOSE, QUADRIVALENT ([facility]  HIGH-DOSE QUADRIVALENT) VACINE, IM            (E03.9) Acquired hypothyroidism  Comment:   Plan: Thyroid Stimulating Hormone, CANCELED: Thyroid         Stimulating Hormone            (E78.00) Pure hypercholesterolemia  Comment:   Plan: Comprehensive Metabolic Panel, Lipid Panel             I did review patient's past medical and family/social history, no changes noted.  Barriers to Learning assessed: none. Patient verbalizes understanding of teaching and instructions.  Zettie Cooley MD      Electronically Signed By:  Zettie Cooley, MD  Physician associate   [facility] [place]  [phone]

## 2022-07-28 DIAGNOSIS — N3281 Overactive bladder: Secondary | ICD-10-CM | POA: Diagnosis not present

## 2022-07-28 DIAGNOSIS — D3001 Benign neoplasm of right kidney: Secondary | ICD-10-CM | POA: Diagnosis not present

## 2022-07-28 DIAGNOSIS — I25119 Atherosclerotic heart disease of native coronary artery with unspecified angina pectoris: Secondary | ICD-10-CM | POA: Diagnosis not present

## 2022-07-28 DIAGNOSIS — K449 Diaphragmatic hernia without obstruction or gangrene: Secondary | ICD-10-CM | POA: Diagnosis not present

## 2022-07-28 DIAGNOSIS — K591 Functional diarrhea: Secondary | ICD-10-CM | POA: Diagnosis not present

## 2022-07-30 ENCOUNTER — Ambulatory Visit: Payer: Medicare Other | Admitting: Internal Medicine

## 2022-08-03 ENCOUNTER — Other Ambulatory Visit: Payer: Self-pay | Admitting: Internal Medicine

## 2022-08-03 DIAGNOSIS — E785 Hyperlipidemia, unspecified: Secondary | ICD-10-CM

## 2022-08-10 ENCOUNTER — Other Ambulatory Visit: Payer: Self-pay | Admitting: Internal Medicine

## 2022-08-11 DIAGNOSIS — I252 Old myocardial infarction: Secondary | ICD-10-CM | POA: Diagnosis not present

## 2022-08-11 DIAGNOSIS — I2510 Atherosclerotic heart disease of native coronary artery without angina pectoris: Secondary | ICD-10-CM | POA: Diagnosis not present

## 2022-08-11 DIAGNOSIS — I051 Rheumatic mitral insufficiency: Secondary | ICD-10-CM | POA: Diagnosis not present

## 2022-08-11 DIAGNOSIS — I509 Heart failure, unspecified: Secondary | ICD-10-CM | POA: Diagnosis not present

## 2022-08-11 DIAGNOSIS — R058 Other specified cough: Secondary | ICD-10-CM | POA: Diagnosis not present

## 2022-08-11 DIAGNOSIS — Z955 Presence of coronary angioplasty implant and graft: Secondary | ICD-10-CM | POA: Diagnosis not present

## 2022-08-11 DIAGNOSIS — I255 Ischemic cardiomyopathy: Secondary | ICD-10-CM | POA: Diagnosis not present

## 2022-08-11 DIAGNOSIS — I10 Essential (primary) hypertension: Secondary | ICD-10-CM | POA: Diagnosis not present

## 2022-08-24 DIAGNOSIS — F322 Major depressive disorder, single episode, severe without psychotic features: Secondary | ICD-10-CM | POA: Diagnosis not present

## 2022-08-24 DIAGNOSIS — E782 Mixed hyperlipidemia: Secondary | ICD-10-CM | POA: Diagnosis not present

## 2022-08-24 DIAGNOSIS — E039 Hypothyroidism, unspecified: Secondary | ICD-10-CM | POA: Diagnosis not present

## 2022-08-24 DIAGNOSIS — I10 Essential (primary) hypertension: Secondary | ICD-10-CM | POA: Diagnosis not present

## 2022-08-24 DIAGNOSIS — G5601 Carpal tunnel syndrome, right upper limb: Secondary | ICD-10-CM | POA: Diagnosis not present

## 2022-08-26 DIAGNOSIS — R410 Disorientation, unspecified: Secondary | ICD-10-CM | POA: Diagnosis not present

## 2022-08-26 DIAGNOSIS — N3000 Acute cystitis without hematuria: Secondary | ICD-10-CM | POA: Diagnosis not present

## 2022-09-01 DIAGNOSIS — M4722 Other spondylosis with radiculopathy, cervical region: Secondary | ICD-10-CM | POA: Diagnosis not present

## 2022-09-01 DIAGNOSIS — M5030 Other cervical disc degeneration, unspecified cervical region: Secondary | ICD-10-CM | POA: Diagnosis not present

## 2022-09-01 DIAGNOSIS — M4802 Spinal stenosis, cervical region: Secondary | ICD-10-CM | POA: Diagnosis not present

## 2022-09-01 DIAGNOSIS — I10 Essential (primary) hypertension: Secondary | ICD-10-CM | POA: Diagnosis not present

## 2022-09-03 DIAGNOSIS — N3000 Acute cystitis without hematuria: Secondary | ICD-10-CM | POA: Diagnosis not present

## 2022-09-03 DIAGNOSIS — I10 Essential (primary) hypertension: Secondary | ICD-10-CM | POA: Diagnosis not present

## 2022-09-03 DIAGNOSIS — R410 Disorientation, unspecified: Secondary | ICD-10-CM | POA: Diagnosis not present

## 2022-09-14 ENCOUNTER — Other Ambulatory Visit: Payer: Self-pay | Admitting: Internal Medicine

## 2022-09-14 DIAGNOSIS — E785 Hyperlipidemia, unspecified: Secondary | ICD-10-CM

## 2022-09-14 MED ORDER — ATORVASTATIN 40 MG TABLET
40.0000 mg | ORAL_TABLET | Freq: Every day | ORAL | 0 refills | Status: DC
Start: 2022-09-14 — End: 2023-01-08

## 2022-09-14 MED ORDER — CLOPIDOGREL 75 MG TABLET
75.0000 mg | ORAL_TABLET | Freq: Every day | ORAL | 0 refills | Status: DC
Start: 2022-09-14 — End: 2023-01-18

## 2022-09-14 MED ORDER — LEVOTHYROXINE 112 MCG TABLET
ORAL_TABLET | ORAL | 0 refills | Status: DC
Start: 2022-09-14 — End: 2022-12-14

## 2022-09-14 NOTE — Telephone Encounter (Signed)
General Requests:    Requesting a refill of LevoTHYROxine (LEVOXYL) .  The patient states that the dose is 112 mcg Tablet [  and the instructions are : TAKE 1 TABLET DAILY FOR THYROID .  The patient requests Send to preferred pharmacy.    Pt have a week in a half and needs a refill on medication . 90 days supply .        General Requests:    Requesting a refill of Atorvastatin (LIPITOR) .  The patient states that the dose is 40 mg tablet  and the instructions are TAKE 1 TABLET DAILY .  The patient requests Send to preferred pharmacy.    Pt has a month left and just want pcp have  it ready for her 90 days supply       General Requests:    Requesting a refill of Clopidogrel (PLAVIX) .  The patient states that the dose is 75 mg Tablet  and the instructions are  TAKE 1 TABLET EVERY MORNING FOR BLOOD THINNER .  The patient requests Send to preferred pharmacy.      Pt has about a month remaining left and need 90 days supply       Hulan Amato, [facility]

## 2022-09-24 DIAGNOSIS — G5601 Carpal tunnel syndrome, right upper limb: Secondary | ICD-10-CM | POA: Diagnosis not present

## 2022-10-01 DIAGNOSIS — G5601 Carpal tunnel syndrome, right upper limb: Secondary | ICD-10-CM | POA: Diagnosis not present

## 2022-10-03 DIAGNOSIS — Z888 Allergy status to other drugs, medicaments and biological substances status: Secondary | ICD-10-CM | POA: Diagnosis not present

## 2022-10-03 DIAGNOSIS — R413 Other amnesia: Secondary | ICD-10-CM | POA: Diagnosis not present

## 2022-10-03 DIAGNOSIS — Z7982 Long term (current) use of aspirin: Secondary | ICD-10-CM | POA: Diagnosis not present

## 2022-10-03 DIAGNOSIS — Z79890 Hormone replacement therapy (postmenopausal): Secondary | ICD-10-CM | POA: Diagnosis not present

## 2022-10-03 DIAGNOSIS — Z90710 Acquired absence of both cervix and uterus: Secondary | ICD-10-CM | POA: Diagnosis not present

## 2022-10-03 DIAGNOSIS — R9431 Abnormal electrocardiogram [ECG] [EKG]: Secondary | ICD-10-CM | POA: Diagnosis not present

## 2022-10-03 DIAGNOSIS — Z9049 Acquired absence of other specified parts of digestive tract: Secondary | ICD-10-CM | POA: Diagnosis not present

## 2022-10-03 DIAGNOSIS — I2510 Atherosclerotic heart disease of native coronary artery without angina pectoris: Secondary | ICD-10-CM | POA: Diagnosis not present

## 2022-10-03 DIAGNOSIS — R4182 Altered mental status, unspecified: Secondary | ICD-10-CM | POA: Diagnosis not present

## 2022-10-03 DIAGNOSIS — Z885 Allergy status to narcotic agent status: Secondary | ICD-10-CM | POA: Diagnosis not present

## 2022-10-03 DIAGNOSIS — I10 Essential (primary) hypertension: Secondary | ICD-10-CM | POA: Diagnosis not present

## 2022-10-03 DIAGNOSIS — Z79899 Other long term (current) drug therapy: Secondary | ICD-10-CM | POA: Diagnosis not present

## 2022-10-05 DIAGNOSIS — E782 Mixed hyperlipidemia: Secondary | ICD-10-CM | POA: Diagnosis not present

## 2022-10-05 DIAGNOSIS — E039 Hypothyroidism, unspecified: Secondary | ICD-10-CM | POA: Diagnosis not present

## 2022-10-05 DIAGNOSIS — J45909 Unspecified asthma, uncomplicated: Secondary | ICD-10-CM | POA: Diagnosis not present

## 2022-10-05 DIAGNOSIS — I10 Essential (primary) hypertension: Secondary | ICD-10-CM | POA: Diagnosis not present

## 2022-10-06 DIAGNOSIS — N390 Urinary tract infection, site not specified: Secondary | ICD-10-CM | POA: Diagnosis not present

## 2022-10-07 DIAGNOSIS — H52223 Regular astigmatism, bilateral: Secondary | ICD-10-CM | POA: Diagnosis not present

## 2022-10-07 DIAGNOSIS — Z135 Encounter for screening for eye and ear disorders: Secondary | ICD-10-CM | POA: Diagnosis not present

## 2022-10-07 DIAGNOSIS — H524 Presbyopia: Secondary | ICD-10-CM | POA: Diagnosis not present

## 2022-10-10 DIAGNOSIS — H524 Presbyopia: Secondary | ICD-10-CM | POA: Diagnosis not present

## 2022-10-13 DIAGNOSIS — H00012 Hordeolum externum right lower eyelid: Secondary | ICD-10-CM | POA: Diagnosis not present

## 2022-10-22 DIAGNOSIS — G5601 Carpal tunnel syndrome, right upper limb: Secondary | ICD-10-CM | POA: Diagnosis not present

## 2022-10-23 DIAGNOSIS — G5601 Carpal tunnel syndrome, right upper limb: Secondary | ICD-10-CM | POA: Diagnosis not present

## 2022-10-26 ENCOUNTER — Other Ambulatory Visit: Payer: Self-pay | Admitting: *Deleted

## 2022-10-26 MED ORDER — ZONISAMIDE 100 MG PO CAPS: 200.0000 mg | ORAL_CAPSULE | Freq: Every day | ORAL | 1 refills | Status: AC

## 2022-10-26 NOTE — Telephone Encounter (Signed)
Patient scheduled with Dr.Penumalli on 04/05/23   Per note on 04/2022 "we will continue zonisamide 200 mg daily at bedtime and will lacosamide 50 mg twice daily.

## 2022-10-29 ENCOUNTER — Telehealth: Payer: Self-pay | Admitting: *Deleted

## 2022-10-29 NOTE — Telephone Encounter (Signed)
Pt called medication question about Pristiq. Please call [phone]

## 2022-10-29 NOTE — Telephone Encounter (Signed)
Called the patient back at the number provided and a man answered and he states pt is not home. He provided me with cell Called the pt and she had a question about how she should take the medication, whether its 1 tab daily or 2 tab daily. On paperwork from her visit here (in July) is was stated that she should be 2 a day I advised that we are not the prescribers for this medication. Advised when we saw her on that visit we entered in what was reported to [place]. Advised that she would need to reach out to her PCP to confirm but it appears in 2022 she was suppose to have taken 2 a day. Pt verbalized understanding and will contact PCP

## 2022-11-11 DIAGNOSIS — Z955 Presence of coronary angioplasty implant and graft: Secondary | ICD-10-CM | POA: Diagnosis not present

## 2022-11-11 DIAGNOSIS — I10 Essential (primary) hypertension: Secondary | ICD-10-CM | POA: Diagnosis not present

## 2022-11-11 DIAGNOSIS — I2510 Atherosclerotic heart disease of native coronary artery without angina pectoris: Secondary | ICD-10-CM | POA: Diagnosis not present

## 2022-11-11 DIAGNOSIS — I255 Ischemic cardiomyopathy: Secondary | ICD-10-CM | POA: Diagnosis not present

## 2022-11-11 DIAGNOSIS — E782 Mixed hyperlipidemia: Secondary | ICD-10-CM | POA: Diagnosis not present

## 2022-11-19 DIAGNOSIS — N3020 Other chronic cystitis without hematuria: Secondary | ICD-10-CM | POA: Diagnosis not present

## 2022-11-27 DIAGNOSIS — H0012 Chalazion right lower eyelid: Secondary | ICD-10-CM | POA: Diagnosis not present

## 2022-12-01 DIAGNOSIS — E039 Hypothyroidism, unspecified: Secondary | ICD-10-CM | POA: Diagnosis not present

## 2022-12-01 DIAGNOSIS — E782 Mixed hyperlipidemia: Secondary | ICD-10-CM | POA: Diagnosis not present

## 2022-12-01 DIAGNOSIS — J45909 Unspecified asthma, uncomplicated: Secondary | ICD-10-CM | POA: Diagnosis not present

## 2022-12-01 DIAGNOSIS — G40209 Localization-related (focal) (partial) symptomatic epilepsy and epileptic syndromes with complex partial seizures, not intractable, without status epilepticus: Secondary | ICD-10-CM | POA: Diagnosis not present

## 2022-12-01 DIAGNOSIS — F3342 Major depressive disorder, recurrent, in full remission: Secondary | ICD-10-CM | POA: Diagnosis not present

## 2022-12-01 DIAGNOSIS — I25119 Atherosclerotic heart disease of native coronary artery with unspecified angina pectoris: Secondary | ICD-10-CM | POA: Diagnosis not present

## 2022-12-01 DIAGNOSIS — I10 Essential (primary) hypertension: Secondary | ICD-10-CM | POA: Diagnosis not present

## 2022-12-01 DIAGNOSIS — I700 Atherosclerosis of aorta: Secondary | ICD-10-CM | POA: Diagnosis not present

## 2022-12-01 DIAGNOSIS — K219 Gastro-esophageal reflux disease without esophagitis: Secondary | ICD-10-CM | POA: Diagnosis not present

## 2022-12-01 DIAGNOSIS — Z0000 Encounter for general adult medical examination without abnormal findings: Secondary | ICD-10-CM | POA: Diagnosis not present

## 2022-12-02 ENCOUNTER — Other Ambulatory Visit: Payer: Self-pay | Admitting: Diagnostic Radiology

## 2022-12-02 DIAGNOSIS — Z1231 Encounter for screening mammogram for malignant neoplasm of breast: Secondary | ICD-10-CM

## 2022-12-03 ENCOUNTER — Other Ambulatory Visit: Payer: Self-pay | Admitting: Family Medicine

## 2022-12-03 DIAGNOSIS — M810 Age-related osteoporosis without current pathological fracture: Secondary | ICD-10-CM

## 2022-12-13 ENCOUNTER — Other Ambulatory Visit: Payer: Self-pay | Admitting: Family

## 2022-12-14 NOTE — Telephone Encounter (Signed)
Recent Visits  Date Type Provider Dept   07/27/22 Office Visit Maurine Minister, MD [facility]   01/27/22 Office Visit Maurine Minister, MD [facility]   07/29/21 Office Visit Maurine Minister, MD [facility]   Showing recent visits within past 540 days with a meds authorizing provider and meeting all other requirements  Future Appointments  Date Type Provider Dept   01/26/23 Appointment Maurine Minister, MD [facility]   Showing future appointments within next 150 days with a meds authorizing provider and meeting all other requirements      Last Ordered: 09/14/22    Katina Dung, LVN

## 2022-12-16 DIAGNOSIS — K449 Diaphragmatic hernia without obstruction or gangrene: Secondary | ICD-10-CM | POA: Diagnosis not present

## 2022-12-16 DIAGNOSIS — K5900 Constipation, unspecified: Secondary | ICD-10-CM | POA: Diagnosis not present

## 2022-12-16 DIAGNOSIS — I10 Essential (primary) hypertension: Secondary | ICD-10-CM | POA: Diagnosis not present

## 2022-12-16 DIAGNOSIS — K219 Gastro-esophageal reflux disease without esophagitis: Secondary | ICD-10-CM | POA: Diagnosis not present

## 2022-12-16 DIAGNOSIS — R197 Diarrhea, unspecified: Secondary | ICD-10-CM | POA: Diagnosis not present

## 2022-12-21 DIAGNOSIS — K219 Gastro-esophageal reflux disease without esophagitis: Secondary | ICD-10-CM | POA: Diagnosis not present

## 2023-01-01 DIAGNOSIS — K921 Melena: Secondary | ICD-10-CM | POA: Diagnosis not present

## 2023-01-07 ENCOUNTER — Other Ambulatory Visit: Payer: Self-pay | Admitting: *Deleted

## 2023-01-07 ENCOUNTER — Other Ambulatory Visit: Payer: Self-pay | Admitting: Family

## 2023-01-07 DIAGNOSIS — E785 Hyperlipidemia, unspecified: Secondary | ICD-10-CM

## 2023-01-07 DIAGNOSIS — I10 Essential (primary) hypertension: Secondary | ICD-10-CM | POA: Diagnosis not present

## 2023-01-07 DIAGNOSIS — Z1330 Encounter for screening examination for mental health and behavioral disorders, unspecified: Secondary | ICD-10-CM | POA: Diagnosis not present

## 2023-01-07 DIAGNOSIS — G40909 Epilepsy, unspecified, not intractable, without status epilepticus: Secondary | ICD-10-CM | POA: Diagnosis not present

## 2023-01-07 DIAGNOSIS — R413 Other amnesia: Secondary | ICD-10-CM | POA: Diagnosis not present

## 2023-01-07 DIAGNOSIS — F32A Depression, unspecified: Secondary | ICD-10-CM | POA: Diagnosis not present

## 2023-01-07 DIAGNOSIS — J449 Chronic obstructive pulmonary disease, unspecified: Secondary | ICD-10-CM | POA: Diagnosis not present

## 2023-01-07 MED ORDER — LACOSAMIDE 50 MG PO TABS: 50.0000 mg | ORAL_TABLET | Freq: Two times a day (BID) | ORAL | 0 refills | Status: DC

## 2023-01-07 NOTE — Telephone Encounter (Signed)
Pt last seen on 04/27/2022 Follow up visit scheduled on 04/05/23 Rx last filled on 10/13/22 # 180 tablets Rx pending to be signed

## 2023-01-14 MED ORDER — LACOSAMIDE 50 MG PO TABS: 50.0000 mg | ORAL_TABLET | Freq: Two times a day (BID) | ORAL | 0 refills | Status: DC

## 2023-01-14 NOTE — Telephone Encounter (Signed)
Rob called. Requesting a prescription be sent to [email or web address] [email or web address] [address], [place], [place] [address] lacosamide ([name]) 50 MG TABS tablet  Stated they are [facility] of [facility] at [facility] #[id number]

## 2023-01-14 NOTE — Addendum Note (Signed)
Addended by: Wyvonnia Lora on: 01/14/2023 12:27 PM   Modules accepted: Orders

## 2023-01-15 ENCOUNTER — Other Ambulatory Visit: Payer: Self-pay | Admitting: Family

## 2023-01-19 ENCOUNTER — Ambulatory Visit: Payer: Medicare Other | Attending: Internal Medicine

## 2023-01-19 DIAGNOSIS — E039 Hypothyroidism, unspecified: Secondary | ICD-10-CM | POA: Insufficient documentation

## 2023-01-19 DIAGNOSIS — E7800 Pure hypercholesterolemia, unspecified: Secondary | ICD-10-CM | POA: Insufficient documentation

## 2023-01-19 LAB — THYROID STIMULATING HORMONE: Thyroid Stimulating Hormone: 3.54 u[IU]/mL (ref 0.27–4.20)

## 2023-01-19 LAB — COMPREHENSIVE METABOLIC PANEL
Alanine Transferase (ALT): 14 U/L (ref <=33)
Albumin: 3.9 g/dL — ABNORMAL LOW (ref 4.0–4.9)
Alkaline Phosphatase (ALP): 71 U/L (ref 35–129)
Anion Gap: 9 mmol/L (ref 7–15)
Aspartate Transaminase (AST): 20 U/L (ref <=41)
Bilirubin Total: 0.7 mg/dL (ref <=1.2)
Calcium: 8.9 mg/dL (ref 8.6–10.0)
Carbon Dioxide Total: 28 mmol/L (ref 22–29)
Chloride: 103 mmol/L (ref 98–107)
Creatinine Serum: 0.88 mg/dL (ref 0.51–1.17)
E-GFR Creatinine (Female): 67 mL/min/{1.73_m2}
Glucose: 94 mg/dL (ref 74–109)
Potassium: 4.1 mmol/L (ref 3.4–5.1)
Protein: 6.5 g/dL — ABNORMAL LOW (ref 6.6–8.7)
Sodium: 140 mmol/L (ref 136–145)
Urea Nitrogen, Blood (BUN): 16 mg/dL (ref 6–20)

## 2023-01-19 LAB — LIPID PANEL
Cholesterol: 208 mg/dL — ABNORMAL HIGH (ref <200)
HDL Cholesterol: 67 mg/dL (ref >40)
LDL Cholesterol Calculation: 115 mg/dL — ABNORMAL HIGH (ref <100)
Non-HDL Cholesterol: 141 mg/dL (ref <=150)
Total Cholesterol: HDL Ratio: 3.1 (ref <4.0)
Triglyceride: 129 mg/dL (ref <150)

## 2023-01-22 NOTE — Progress Notes (Deleted)
CDI Query    ADDITIONAL CLARIFICATION REQUESTED    By submitting this query, we are seeking further clarification of documentation for the current year, to accurately reflect all conditions that you are monitoring, evaluating, and/or treating, to its greatest specificity, on 01/26/2023 during your face-to-face patient encounter.   Please select your response from the options provided below. In addition, please include diagnosis status. Thank you.    CLINICAL FINDINGS:  Current medication list includes Plavix  04/24/2020 documentation "Paroxysmal atrial fibrillation Comment: History of Plan: Currently not on any anticoagulation".     RISK FACTORS: advanced age    TREATMENT: Monitoring, routine scheduled appointments, labs and medication management    Please exercise your independent professional judgement in your response and include it in your progress note as applicable.    QUESTION: Based on your medical judgement and review of the clinical indicators and findings, can the patient's coexisting condition be clarified or further specified as:    Paroxysmal atrial fibrillation (PAF)  (if applicable, please further specify the status of the Dx, e.g., well controlled, resolving, resolved, or having an exacerbation, progression, or side effect)  Other explanation/diagnosis: (Please document and include support.)  Clinically undetermined  Not addressed at today's encounter    ANSWER:     Please include supporting documentation in your note to indicate patient diagnosis "status" (e.g., well controlled, resolving, resolved, or having an exacerbation, progression, or side effect).    (To close this note, please click "Accept")    Respectfully,     Marikay Alar BSN, RN, CRC, CDEO, [facility]   Clinical Documentation Integrity: Outpatient  Health Information Management (HIM) Division Patient Financial Services  [employer] [facility]  [email or web address]  C. [phone]

## 2023-01-26 ENCOUNTER — Ambulatory Visit: Payer: Medicare Other | Admitting: Internal Medicine

## 2023-01-26 ENCOUNTER — Encounter: Payer: Self-pay | Admitting: Internal Medicine

## 2023-01-26 VITALS — BP 128/66 | HR 68 | Temp 97.5°F | Resp 16 | Ht 66.0 in | Wt 164.0 lb

## 2023-01-26 DIAGNOSIS — E7800 Pure hypercholesterolemia, unspecified: Secondary | ICD-10-CM

## 2023-01-26 DIAGNOSIS — E039 Hypothyroidism, unspecified: Secondary | ICD-10-CM

## 2023-01-26 NOTE — Nursing Note (Signed)
Patient is here to follow up on cholesterol. Patient is here with self.  Tammy [name] has been screened for pain and falls. Asked patient if they have been seen by any other physicians or been hospitalized since last visit to clinic, two identifiers have been used, name and date of birth. Eldridge Abrahams, MA

## 2023-01-26 NOTE — Progress Notes (Signed)
Chief Complaint: Patient presents today with   Follow-up on cholesterol      Tammy [name] If you are reviewing this progress note and have questions about the meaning or medical terms being used, please schedule an appointment or bring it up at your next follow-up appointment. Medical notes are meant to be a communication tool between medical professionals and require medical terms to be used for efficiency.      - Voice recognition software may have been used in portions of this report.   Inadvertent computerized transcription errors may be present despite efforts.    Portions of this note were dictated using [employer]. Omissions, "sound-alikes" and sentence fragmentation are unintentional.      Hyperlipidemia,    management discussed with patient, shehas  been working on  diet, she has  been doing regular exercise. Patient verbalised understanding about the significance for regular exercise and low fat diet in management of this problem      Lab Results   Lab Name Value Date/Time    CHOL 208 (H) 01/19/2023 07:29 AM    CHOL 188 05/09/2014 09:44 AM    LDLC 115 (H) 01/19/2023 07:29 AM    LDLC 107 05/09/2014 09:44 AM    HDL 67 01/19/2023 07:29 AM    HDL 59 05/09/2014 09:44 AM    TRIG 129 01/19/2023 07:29 AM    TRIG 108 05/09/2014 09:44 AM     She declines to have DEXA scan she does take calcium and vitamin D supplement daily.  Does not want to have shingles shot      No symptoms of hypo or hyperthyroidism: no decreased or increased weight, no feeling cold/chilly or excessively warm, no diarrhea or constipation, no undue sweatiness, anxiety or palpitations.     Lab Results   Lab Name Value Date/Time    TSH 3.54 01/19/2023 07:29 AM    TSH 1.89 05/09/2014 09:44 AM    FT4 1.27 06/17/2009 08:16 AM      Ros  complete review of symptons was done all other systems are negative except as otherwise stated in HPI      Vs  BP 128/66 (SITE: right arm, Orthostatic Position: sitting, Cuff Size: regular)   Pulse 68   Temp 36.4  C (97.5 F) (Temporal)   Resp 16   Ht 1.676 m (5\' 6" )   Wt 74.4 kg (164 lb)   SpO2 97%   BMI 26.47 kg/m     Physical exam -  General Appearance: healthy, alert, no distress, pleasant affect, cooperative.  Eyes:  conjunctivae and corneas clear. PERRL, EOM's intact. sclerae normal.  Ears:  external inspection of ears show no abnormality.  Lungs: no chest deformities noted .lungs clear to auscultation.  Heart:  normal rate and regular rhythm, no murmurs,  Extremities:  no cyanosis, clubbing, or edema.    ASSESMENT AND PLAN     (E03.9) Acquired hypothyroidism  (primary encounter diagnosis)  Comment:   Plan: Thyroid Stimulating Hormone            (E78.00) Pure hypercholesterolemia  Comment:   Plan: CBC with Differential, Comprehensive Metabolic         Panel, Lipid Panel             I did review patient's past medical and family/social history, no changes noted.  Barriers to Learning assessed: none. Patient verbalizes understanding of teaching and instructions.  Zettie Cooley MD

## 2023-01-28 DIAGNOSIS — H00014 Hordeolum externum left upper eyelid: Secondary | ICD-10-CM | POA: Diagnosis not present

## 2023-02-09 ENCOUNTER — Other Ambulatory Visit: Payer: Self-pay | Admitting: Diagnostic Neuroimaging

## 2023-02-09 ENCOUNTER — Ambulatory Visit
Admission: RE | Admit: 2023-02-09 | Discharge: 2023-02-09 | Disposition: A | Discharge status: Home / Self Care | Payer: Medicare Other | Source: Ambulatory Visit | Attending: Diagnostic Radiology | Admitting: Diagnostic Radiology

## 2023-02-09 ENCOUNTER — Other Ambulatory Visit: Payer: Self-pay | Admitting: Family Medicine

## 2023-02-09 DIAGNOSIS — Z1231 Encounter for screening mammogram for malignant neoplasm of breast: Secondary | ICD-10-CM

## 2023-02-22 DIAGNOSIS — K5900 Constipation, unspecified: Secondary | ICD-10-CM | POA: Diagnosis not present

## 2023-02-22 DIAGNOSIS — I10 Essential (primary) hypertension: Secondary | ICD-10-CM | POA: Diagnosis not present

## 2023-02-22 DIAGNOSIS — K219 Gastro-esophageal reflux disease without esophagitis: Secondary | ICD-10-CM | POA: Diagnosis not present

## 2023-02-23 DIAGNOSIS — M4722 Other spondylosis with radiculopathy, cervical region: Secondary | ICD-10-CM | POA: Diagnosis not present

## 2023-02-23 DIAGNOSIS — M47812 Spondylosis without myelopathy or radiculopathy, cervical region: Secondary | ICD-10-CM | POA: Diagnosis not present

## 2023-02-23 DIAGNOSIS — M4802 Spinal stenosis, cervical region: Secondary | ICD-10-CM | POA: Diagnosis not present

## 2023-02-23 DIAGNOSIS — M5030 Other cervical disc degeneration, unspecified cervical region: Secondary | ICD-10-CM | POA: Diagnosis not present

## 2023-02-25 DIAGNOSIS — N3020 Other chronic cystitis without hematuria: Secondary | ICD-10-CM | POA: Diagnosis not present

## 2023-02-25 DIAGNOSIS — D3000 Benign neoplasm of unspecified kidney: Secondary | ICD-10-CM | POA: Diagnosis not present

## 2023-03-05 DIAGNOSIS — K080 Exfoliation of teeth due to systemic causes: Secondary | ICD-10-CM | POA: Diagnosis not present

## 2023-03-10 ENCOUNTER — Other Ambulatory Visit: Payer: Self-pay | Admitting: Diagnostic Neuroimaging

## 2023-03-10 NOTE — Telephone Encounter (Signed)
Requested Prescriptions   Pending Prescriptions Disp Refills   lacosamide ([name]) 50 MG TABS tablet [Pharmacy Med Name: LACOSAMIDE 50 MG TABLET] 60 tablet 0    Sig: TAKE 1 TABLET BY MOUTH TWICE A DAY   Last seen 04/27/22, next appt scheduled 04/05/23 Dispenses   Dispensed Days Supply Quantity Provider Pharmacy  LACOSAMIDE 50 MG TABLET 02/11/2023 30 60 each Penumalli, Glenford Bayley, MD [phone] - S...  LACOSAMIDE 50 MG TABLET 01/14/2023 30 60 each Penumalli, Glenford Bayley, MD [facility] [phone] - S...  LACOSAMIDE 50 MG TABLET 10/13/2022 90 180 each Shawnie Dapper, NP [facility] [phone] - O...  LACOSAMIDE 50 MG TABLET 07/16/2022 90 180 each Lomax, [name], NP [facility] [phone] - O...  LACOSAMIDE 50 MG TABS 07/13/2022 90 180 tablet Shawnie Dapper, NP [facility] - ...  LACOSAMIDE 50 MG TABLET 04/13/2022 90 180 each Lomax, [name], NP [facility] [phone] - O.Marland KitchenMarland Kitchen

## 2023-04-05 ENCOUNTER — Ambulatory Visit: Payer: Medicare Other | Admitting: Diagnostic Neuroimaging

## 2023-04-06 DIAGNOSIS — Z0000 Encounter for general adult medical examination without abnormal findings: Secondary | ICD-10-CM | POA: Diagnosis not present

## 2023-04-06 DIAGNOSIS — J4521 Mild intermittent asthma with (acute) exacerbation: Secondary | ICD-10-CM | POA: Diagnosis not present

## 2023-04-13 ENCOUNTER — Other Ambulatory Visit: Payer: Self-pay | Admitting: Diagnostic Neuroimaging

## 2023-04-15 DIAGNOSIS — J40 Bronchitis, not specified as acute or chronic: Secondary | ICD-10-CM | POA: Diagnosis not present

## 2023-05-06 DIAGNOSIS — F32A Depression, unspecified: Secondary | ICD-10-CM | POA: Diagnosis not present

## 2023-05-06 DIAGNOSIS — G40909 Epilepsy, unspecified, not intractable, without status epilepticus: Secondary | ICD-10-CM | POA: Diagnosis not present

## 2023-05-06 DIAGNOSIS — R413 Other amnesia: Secondary | ICD-10-CM | POA: Diagnosis not present

## 2023-05-13 DIAGNOSIS — Z955 Presence of coronary angioplasty implant and graft: Secondary | ICD-10-CM | POA: Diagnosis not present

## 2023-05-13 DIAGNOSIS — Z7982 Long term (current) use of aspirin: Secondary | ICD-10-CM | POA: Diagnosis not present

## 2023-05-13 DIAGNOSIS — R9431 Abnormal electrocardiogram [ECG] [EKG]: Secondary | ICD-10-CM | POA: Diagnosis not present

## 2023-05-13 DIAGNOSIS — I959 Hypotension, unspecified: Secondary | ICD-10-CM | POA: Diagnosis not present

## 2023-05-13 DIAGNOSIS — I2510 Atherosclerotic heart disease of native coronary artery without angina pectoris: Secondary | ICD-10-CM | POA: Diagnosis not present

## 2023-05-13 DIAGNOSIS — R0789 Other chest pain: Secondary | ICD-10-CM | POA: Diagnosis not present

## 2023-05-13 DIAGNOSIS — R079 Chest pain, unspecified: Secondary | ICD-10-CM | POA: Diagnosis not present

## 2023-05-13 DIAGNOSIS — R110 Nausea: Secondary | ICD-10-CM | POA: Diagnosis not present

## 2023-05-13 DIAGNOSIS — Z888 Allergy status to other drugs, medicaments and biological substances status: Secondary | ICD-10-CM | POA: Diagnosis not present

## 2023-05-13 DIAGNOSIS — R197 Diarrhea, unspecified: Secondary | ICD-10-CM | POA: Diagnosis not present

## 2023-05-13 DIAGNOSIS — Z79899 Other long term (current) drug therapy: Secondary | ICD-10-CM | POA: Diagnosis not present

## 2023-05-13 DIAGNOSIS — I10 Essential (primary) hypertension: Secondary | ICD-10-CM | POA: Diagnosis not present

## 2023-05-13 DIAGNOSIS — F419 Anxiety disorder, unspecified: Secondary | ICD-10-CM | POA: Diagnosis not present

## 2023-05-27 DIAGNOSIS — G40909 Epilepsy, unspecified, not intractable, without status epilepticus: Secondary | ICD-10-CM | POA: Diagnosis not present

## 2023-05-27 DIAGNOSIS — R9401 Abnormal electroencephalogram [EEG]: Secondary | ICD-10-CM | POA: Diagnosis not present

## 2023-06-02 DIAGNOSIS — E039 Hypothyroidism, unspecified: Secondary | ICD-10-CM | POA: Diagnosis not present

## 2023-06-02 DIAGNOSIS — I10 Essential (primary) hypertension: Secondary | ICD-10-CM | POA: Diagnosis not present

## 2023-06-02 DIAGNOSIS — E782 Mixed hyperlipidemia: Secondary | ICD-10-CM | POA: Diagnosis not present

## 2023-06-02 DIAGNOSIS — F329 Major depressive disorder, single episode, unspecified: Secondary | ICD-10-CM | POA: Diagnosis not present

## 2023-06-10 DIAGNOSIS — G894 Chronic pain syndrome: Secondary | ICD-10-CM | POA: Diagnosis not present

## 2023-06-10 DIAGNOSIS — M5137 Other intervertebral disc degeneration, lumbosacral region: Secondary | ICD-10-CM | POA: Diagnosis not present

## 2023-06-10 DIAGNOSIS — M48061 Spinal stenosis, lumbar region without neurogenic claudication: Secondary | ICD-10-CM | POA: Diagnosis not present

## 2023-06-10 DIAGNOSIS — M5136 Other intervertebral disc degeneration, lumbar region: Secondary | ICD-10-CM | POA: Diagnosis not present

## 2023-06-10 DIAGNOSIS — M48062 Spinal stenosis, lumbar region with neurogenic claudication: Secondary | ICD-10-CM | POA: Diagnosis not present

## 2023-06-10 DIAGNOSIS — M47816 Spondylosis without myelopathy or radiculopathy, lumbar region: Secondary | ICD-10-CM | POA: Diagnosis not present

## 2023-06-17 DIAGNOSIS — I340 Nonrheumatic mitral (valve) insufficiency: Secondary | ICD-10-CM | POA: Diagnosis not present

## 2023-06-17 DIAGNOSIS — E782 Mixed hyperlipidemia: Secondary | ICD-10-CM | POA: Diagnosis not present

## 2023-06-17 DIAGNOSIS — I2510 Atherosclerotic heart disease of native coronary artery without angina pectoris: Secondary | ICD-10-CM | POA: Diagnosis not present

## 2023-06-17 DIAGNOSIS — I10 Essential (primary) hypertension: Secondary | ICD-10-CM | POA: Diagnosis not present

## 2023-06-17 DIAGNOSIS — I255 Ischemic cardiomyopathy: Secondary | ICD-10-CM | POA: Diagnosis not present

## 2023-06-17 DIAGNOSIS — I252 Old myocardial infarction: Secondary | ICD-10-CM | POA: Diagnosis not present

## 2023-06-17 DIAGNOSIS — Z955 Presence of coronary angioplasty implant and graft: Secondary | ICD-10-CM | POA: Diagnosis not present

## 2023-06-17 DIAGNOSIS — I341 Nonrheumatic mitral (valve) prolapse: Secondary | ICD-10-CM | POA: Diagnosis not present

## 2023-06-22 ENCOUNTER — Encounter: Payer: Self-pay | Admitting: Pulmonary Disease

## 2023-06-22 ENCOUNTER — Ambulatory Visit: Payer: Medicare Other | Admitting: Pulmonary Disease

## 2023-06-22 VITALS — BP 124/64 | HR 71 | Temp 98.2°F | Ht 62.0 in | Wt 142.0 lb

## 2023-06-22 DIAGNOSIS — R0602 Shortness of breath: Secondary | ICD-10-CM

## 2023-06-22 DIAGNOSIS — R059 Cough, unspecified: Secondary | ICD-10-CM | POA: Diagnosis not present

## 2023-06-22 MED ORDER — FLUTICASONE PROPIONATE 50 MCG/ACT NA SUSP: 2.0000 | Freq: Every day | NASAL | 2 refills | Status: DC

## 2023-06-22 NOTE — Patient Instructions (Signed)
Will prescribe a steroid nasal spray called Flonase You can try over-the-counter antihistamine called chlorpheniramine.  Take 4 mg 3 times daily Follow-up in 3 months for review and plan for next steps

## 2023-06-22 NOTE — Progress Notes (Signed)
Suzanne Galloway    [id number]    06-15-1941  Primary Care Physician:Meyers, Jeannett Senior, MD  Referring Physician: Sheliah Hatch, PA-C [address] [place] [address] [place],  [place] [address]  Chief complaint: Consult for chronic cough  HPI: 82 y.o. who  has a past medical history of Arthritis, Asthma, uncomplicated, Atrophic gastritis, Atypical chest pain (03/20/2020), Bereavement due to life event, Bronchitis, acute (07/09/2017), Coronary artery disease involving native coronary artery of native heart without angina pectoris (03/10/2020), Degenerative disc disease, thoracic, Dysphagia, Essential hypertension (01/01/2015), Exertional shortness of breath (12/28/2014), GERD (gastroesophageal reflux disease), Greater trochanteric pain syndrome (11/12/2017), Hearing loss, Hemorrhoids, Hiatal hernia, Hyperlipemia, Hypothyroidism, IBS (irritable bowel syndrome), Impaired fasting glucose, Ischemic cardiomyopathy (03/10/2020), 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, not intractable, without status epilepticus (05/12/2017), Localized, primary osteoarthritis of hand (12/16/2021), Mitral valve prolapse, Mixed hyperlipidemia (01/01/2015), Myocardial infarction (03/09/2020), Overactive bladder, Schatzki's ring, Status post insertion of drug eluting coronary artery stent (03/10/2020), Tubular adenoma of colon, and Urinary incontinence.   Complains of chronic cough since 2014.    She was previously evaluated by Dr. Sherene Sires given a diagnosis of upper airway cough.  She also has GERD, hiatal hernia and is on PPI twice daily and Carafate.  She was empirically treated with [facility] in the past for asthma without any improvement.  She is just currently on albuterol 3 times a day.   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  No feather pillows or comforters Smoking history: Never  smoker Travel history: Previously lived in [place] and [place].  No significant recent travel Relevant family history: No family history of lung disease   Outpatient Encounter Medications as of 06/22/2023  Medication Sig   albuterol (VENTOLIN HFA) 108 (90 Base) MCG/ACT inhaler Inhale 1 puff into the lungs every 4 (four) hours.   Ascorbic Acid (VITAMIN C PO) Take 1 tablet by mouth daily.   aspirin 81 MG chewable tablet Chew 81 mg by mouth in the morning and at bedtime.    benzonatate (TESSALON) 200 MG capsule Take 200 mg by mouth 3 (three) times daily as needed.   desvenlafaxine (PRISTIQ) 50 MG 24 hr tablet Take 50 mg by mouth 2 (two) times daily.   lacosamide ([name]) 50 MG TABS tablet TAKE 1 TABLET BY MOUTH TWICE A DAY   levothyroxine (SYNTHROID) 75 MCG tablet Take 75 mcg by mouth daily before breakfast.   metoprolol succinate (TOPROL-XL) 25 MG 24 hr tablet Take 25 mg by mouth daily.   Multiple Vitamin (MULTIVITAMIN ADULT) TABS 1 tablet daily.   Multiple Vitamins-Minerals (PRESERVISION AREDS PO) Take 1 tablet by mouth daily.    nitroGLYCERIN (NITROSTAT) 0.4 MG SL tablet Place 0.4 mg under the tongue every 5 (five) minutes as needed for chest pain.   pantoprazole (PROTONIX) 40 MG tablet Take 1 tablet (40 mg total) by mouth 2 (two) times daily before a meal.   Polyethyl Glycol-Propyl Glycol (SYSTANE OP) Apply 1 drop to eye daily as needed.   polyethylene glycol powder (GLYCOLAX/MIRALAX) 17 GM/SCOOP powder Dissolve 1 capful in 8 ounces of fluid and drink daily   pyridOXINE (VITAMIN B6) 50 MG tablet Take 50 mg by mouth daily.   sacubitril-valsartan (ENTRESTO) 24-26 MG Take 1 tablet by mouth 2 (two) times daily.    sucralfate (CARAFATE) 1 g tablet Take 1 g by mouth 4 (four) times daily -  with meals and at bedtime.   tiZANidine (ZANAFLEX) 2 MG tablet Take 1-2 mg by mouth at bedtime as needed.   traMADol (ULTRAM) 50 MG tablet Take 25 mg by mouth every 6 (six) hours as needed.   vitamin B-12  (CYANOCOBALAMIN) 500 MCG tablet 1 tablet daily.   zonisamide (ZONEGRAN) 100 MG capsule Take 2 capsules (200 mg total) by mouth at bedtime.   atorvastatin (LIPITOR) 80 MG tablet Take 80 mg by mouth daily. (Patient not taking: Reported on 06/22/2023)   [DISCONTINUED] AMBULATORY NON FORMULARY MEDICATION 90 ml Viscous Lidocaine , 90 ml 10mg /6ml Dicyclomine , 270 ml Malox.   [DISCONTINUED] Calcium Carbonate-Vitamin D 600-400 MG-UNIT tablet Take 1 tablet by mouth daily.   [DISCONTINUED] famotidine (PEPCID) 40 MG tablet Take 40 mg by mouth 2 (two) times daily.   [DISCONTINUED] ferrous sulfate 325 (65 FE) MG EC tablet 1 tablet daily.   [DISCONTINUED] spironolactone (ALDACTONE) 25 MG tablet Take 25 mg by mouth daily.   No facility-administered encounter medications on file as of 06/22/2023.    Allergies as of 06/22/2023 - Review Complete 06/22/2023  Allergen Reaction Noted   Codeine Other (See Comments) 10/26/2015   Lamictal [lamotrigine] Other (See Comments) 06/11/2017   Oxcarbazepine Other (See Comments) 06/11/2017   Symbicort [budesonide-formoterol fumarate] Other (See Comments) 11/18/2018   Atorvastatin  01/28/2018   Buspirone  01/28/2018   Carbamazepine Other (See Comments) 09/23/2018   Depakote [divalproex sodium] Nausea And Vomiting 04/11/2018   Dilaudid [hydromorphone] Other (See Comments) 08/05/2015   Dulera [mometasone furo-formoterol fum] Other (See Comments)    Lactose intolerance (gi) Diarrhea 01/24/2015   Morphine and codeine Nausea And Vomiting 09/01/2012   Percocet [oxycodone-acetaminophen] Other (See Comments) 01/01/2015   Pravastatin Other (See Comments) 08/05/2015   Vesicare [solifenacin] Other (See Comments) 06/16/2013   Zonisamide Other (See Comments) 09/23/2018    Past Medical History:  Diagnosis Date   Arthritis    "hands, back, toes" (01/01/2015)   Asthma, uncomplicated    Atrophic gastritis    Atypical chest pain 03/20/2020   Bereavement due to life event     Bronchitis, acute 07/09/2017   Coronary artery disease involving native coronary artery of native heart without angina pectoris 03/10/2020   S/P STEMI 03/10/20, DES LAD, VFib Arrest   Degenerative disc disease, thoracic    Dysphagia    Essential hypertension 01/01/2015   Exertional shortness of breath 12/28/2014   GERD (gastroesophageal reflux disease)    Greater trochanteric pain syndrome 11/12/2017   Hearing loss    hearing aids   Hemorrhoids    Hiatal hernia    Hyperlipemia    Hypothyroidism    IBS (irritable bowel syndrome)    Impaired fasting glucose    Ischemic cardiomyopathy 03/10/2020   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, not intractable, without status epilepticus 05/12/2017   Localized, primary osteoarthritis of hand 12/16/2021   Mitral valve prolapse    Mixed hyperlipidemia 01/01/2015   Myocardial infarction 03/09/2020   Overactive bladder    Schatzki's ring    Status post insertion of drug eluting coronary artery stent 03/10/2020   Tubular adenoma of colon    Urinary incontinence     Past Surgical History:  Procedure Laterality Date   BACK SURGERY     CARDIAC CATHETERIZATION     CARPAL TUNNEL RELEASE Bilateral 2008   CYSTECTOMY Right 09/2014   "hand"    DILATION AND CURETTAGE OF UTERUS     FINGER SURGERY Bilateral    "  scraped arthritis out of thumbs"   INCONTINENCE SURGERY     LAPAROSCOPIC CHOLECYSTECTOMY     LUMBAR LAMINECTOMY  1987; 12/2009   ; Hattie Perch 01/16/2010   TOE SURGERY Left 09/21/2005   "took bone out of 2nd toe; took cyst out"   TUBAL LIGATION     VAGINAL HYSTERECTOMY  1970    Family History  Problem Relation Age of Onset   Parkinson's disease Mother    Heart failure Mother    Diabetes Mother    COPD Father        smoked   Heart disease Father    Esophageal cancer Father        smoked   Transient ischemic attack Father    Cancer Father        kidney   Breast  cancer Daughter    Cancer Daughter    Colon polyps Daughter    Diabetes Maternal Grandmother    Diabetes Paternal Grandfather    Alzheimer's disease Cousin        2 maternal cousins   Deep vein thrombosis Brother    Colon cancer Neg Hx    Stomach cancer Neg Hx     Social History   Socioeconomic History   Marital status: Widowed    Spouse name: Not on file   Number of children: 3   Years of education: 10   Highest education level: GED or equivalent  Occupational History   Occupation: 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  Tobacco Use   Smoking status: Never   Smokeless tobacco: Never  Vaping Use   Vaping status: Never Used  Substance and Sexual Activity   Alcohol use: Yes    Comment: rarely   Drug use: No   Sexual activity: Not Currently  Other Topics Concern   Not on file  Social History Narrative   10/03/19 Lives with dgtr, [name] and son-in-law    Retired, widowed   Education- high school   Caffeine 1 cup daily   Social Determinants of Health   Financial Resource Strain: Low Risk  (06/14/2023)   Received from [facility]   Overall Financial Resource Strain (CARDIA)    Difficulty of Paying Living Expenses: Not very hard  Food Insecurity: No Food Insecurity (06/14/2023)   Received from [facility]   Hunger Vital Sign    Worried About Running Out of Food in the Last Year: Never true    Ran Out of Food in the Last Year: Never true  Transportation Needs: No Transportation Needs (06/14/2023)   Received from [facility] - Transportation    Lack of Transportation (Medical): No    Lack of Transportation (Non-Medical): No  Physical Activity: Insufficiently Active (06/14/2023)   Received from [facility]   Exercise Vital Sign    Days of Exercise per Week: 2 days    Minutes of Exercise per Session: 10 min  Stress: Patient Declined (06/14/2023)   Received from [facility] - Occupational Stress  Questionnaire    Feeling of Stress : Patient declined  Social Connections: Moderately Integrated (06/14/2023)   Received from [facility]   Social Network    How would you rate your social network (family, work, friends)?: Adequate participation with social networks  Intimate Partner Violence: Not At Risk (06/14/2023)   Received from [facility]   HITS    Over the last 12 months how often did your partner physically hurt you?:  1    Over the last 12 months how often did your partner insult you or talk down to you?: 1    Over the last 12 months how often did your partner threaten you with physical harm?: 1    Over the last 12 months how often did your partner scream or curse at you?: 1    Review of systems: Review of Systems  Constitutional: Negative for fever and chills.  HENT: Negative.   Eyes: Negative for blurred vision.  Respiratory: as per HPI  Cardiovascular: Negative for chest pain and palpitations.  Gastrointestinal: Negative for vomiting, diarrhea, blood per rectum. Genitourinary: Negative for dysuria, urgency, frequency and hematuria.  Musculoskeletal: Negative for myalgias, back pain and joint pain.  Skin: Negative for itching and rash.  Neurological: Negative for dizziness, tremors, focal weakness, seizures and loss of consciousness.  Endo/Heme/Allergies: Negative for environmental allergies.  Psychiatric/Behavioral: Negative for depression, suicidal ideas and hallucinations.  All other systems reviewed and are negative.  Physical Exam: Blood pressure 124/64, pulse 71, temperature 98.2 F (36.8 C), temperature source Oral, height 5\' 2"  (1.575 m), weight 142 lb (64.4 kg), SpO2 96%. Gen:      No acute distress HEENT:  EOMI, sclera anicteric Neck:     No masses; no thyromegaly Lungs:    Clear to auscultation bilaterally; normal respiratory effort CV:         Regular rate and rhythm; no murmurs Abd:      + bowel sounds; soft, non-tender; no palpable masses, no  distension Ext:    No edema; adequate peripheral perfusion Skin:      Warm and dry; no rash Neuro: alert and oriented x 3 Psych: normal mood and affect  Data Reviewed: Imaging: Chest x-ray 08/07/2020-clear lungs with no edema consolidation or effusion.  I have reviewed the images personally  PFTs: 02/22/2018 FVC 2.65 [9 9%], FEV1 2.18 [109%], F/F82, TLC 4.85 [98%], DLCO 16.31 [71%] Mild diffusion defect  Labs:  Assessment:  Chronic cough Appears to be multifactorial.  Most likely has upper airway cough and possibly GERD Will for start treating postnasal drip with Flonase, over-the-counter phentermine.  Take 4 mg 3 times daily Review in 3 months and plan for next labs  Plan/Recommendations: Flonase, chlorphentermine  Chilton Greathouse MD [facility] 06/22/2023, 10:23 AM  CC: Sheliah Hatch, PA-C

## 2023-07-13 DIAGNOSIS — I214 Non-ST elevation (NSTEMI) myocardial infarction: Secondary | ICD-10-CM | POA: Diagnosis not present

## 2023-07-13 DIAGNOSIS — R002 Palpitations: Secondary | ICD-10-CM | POA: Diagnosis not present

## 2023-07-13 DIAGNOSIS — I10 Essential (primary) hypertension: Secondary | ICD-10-CM | POA: Diagnosis not present

## 2023-07-13 DIAGNOSIS — R079 Chest pain, unspecified: Secondary | ICD-10-CM | POA: Diagnosis not present

## 2023-07-14 DIAGNOSIS — R002 Palpitations: Secondary | ICD-10-CM | POA: Diagnosis not present

## 2023-07-20 DIAGNOSIS — M48061 Spinal stenosis, lumbar region without neurogenic claudication: Secondary | ICD-10-CM | POA: Diagnosis not present

## 2023-07-20 DIAGNOSIS — M51361 Other intervertebral disc degeneration, lumbar region with lower extremity pain only: Secondary | ICD-10-CM | POA: Diagnosis not present

## 2023-07-21 ENCOUNTER — Ambulatory Visit: Payer: Medicare Other | Attending: Internal Medicine

## 2023-07-21 DIAGNOSIS — E7800 Pure hypercholesterolemia, unspecified: Secondary | ICD-10-CM | POA: Insufficient documentation

## 2023-07-21 DIAGNOSIS — E039 Hypothyroidism, unspecified: Secondary | ICD-10-CM | POA: Insufficient documentation

## 2023-07-21 LAB — COMPREHENSIVE METABOLIC PANEL
Alanine Transferase (ALT): 16 U/L (ref <=33)
Albumin: 3.9 g/dL — ABNORMAL LOW (ref 4.0–4.9)
Alkaline Phosphatase (ALP): 72 U/L (ref 35–129)
Anion Gap: 10 mmol/L (ref 7–15)
Aspartate Transaminase (AST): 23 U/L (ref <=41)
Bilirubin Total: 0.6 mg/dL (ref <=1.2)
Calcium: 8.4 mg/dL — ABNORMAL LOW (ref 8.6–10.0)
Carbon Dioxide Total: 25 mmol/L (ref 22–29)
Chloride: 107 mmol/L (ref 98–107)
Creatinine Serum: 0.82 mg/dL (ref 0.51–1.17)
E-GFR Creatinine (Female): 72 mL/min/{1.73_m2}
Glucose: 85 mg/dL (ref 74–109)
Potassium: 3.8 mmol/L (ref 3.4–5.1)
Protein: 6.4 g/dL — ABNORMAL LOW (ref 6.6–8.7)
Sodium: 142 mmol/L (ref 136–145)
Urea Nitrogen, Blood (BUN): 14 mg/dL (ref 6–20)

## 2023-07-21 LAB — CBC WITH DIFFERENTIAL
Basophils % Auto: 0.8 %
Basophils Abs Auto: 0 10*3/uL (ref 0.0–0.2)
Eosinophils % Auto: 3.6 %
Eosinophils Abs Auto: 0.2 10*3/uL (ref 0.0–0.5)
Hematocrit: 41.3 % (ref 36.0–46.0)
Hemoglobin: 14 g/dL (ref 12.0–16.0)
Lymphocytes % Auto: 36.1 %
Lymphocytes Abs Auto: 2 10*3/uL (ref 1.0–4.8)
MCH: 30.8 pg (ref 27.0–33.0)
MCHC: 33.8 % (ref 32.0–36.0)
MCV: 91.3 fL (ref 80.0–100.0)
MPV: 8.7 fL (ref 6.8–10.0)
Monocytes % Auto: 9.1 %
Monocytes Abs Auto: 0.5 10*3/uL (ref 0.1–0.8)
Neutrophils % Auto: 50.4 %
Neutrophils Abs Auto: 2.8 10*3/uL (ref 1.8–7.7)
Platelet Count: 264 10*3/uL (ref 130–400)
RDW: 13.9 % (ref 0.0–14.7)
Red Blood Cell Count: 4.53 10*6/uL (ref 4.00–5.20)
White Blood Cell Count: 5.5 10*3/uL (ref 4.5–11.0)

## 2023-07-21 LAB — THYROID STIMULATING HORMONE: Thyroid Stimulating Hormone: 1.6 u[IU]/mL (ref 0.27–4.20)

## 2023-07-21 LAB — LIPID PANEL
Cholesterol: 194 mg/dL (ref <200)
HDL Cholesterol: 65 mg/dL (ref >40)
LDL Cholesterol Calculation: 111 mg/dL — ABNORMAL HIGH (ref <100)
Non-HDL Cholesterol: 129 mg/dL (ref <=150)
Total Cholesterol: HDL Ratio: 3 (ref <4.0)
Triglyceride Level: 90 mg/dL (ref <150)

## 2023-07-27 ENCOUNTER — Ambulatory Visit: Payer: Medicare Other | Admitting: Internal Medicine

## 2023-07-27 VITALS — BP 124/70 | HR 68 | Temp 97.4°F | Resp 16 | Ht 66.0 in | Wt 164.0 lb

## 2023-07-27 DIAGNOSIS — Z23 Encounter for immunization: Secondary | ICD-10-CM

## 2023-07-27 DIAGNOSIS — E039 Hypothyroidism, unspecified: Secondary | ICD-10-CM

## 2023-07-27 DIAGNOSIS — E7800 Pure hypercholesterolemia, unspecified: Secondary | ICD-10-CM

## 2023-07-27 DIAGNOSIS — H5212 Myopia, left eye: Secondary | ICD-10-CM | POA: Diagnosis not present

## 2023-07-27 DIAGNOSIS — H52221 Regular astigmatism, right eye: Secondary | ICD-10-CM | POA: Diagnosis not present

## 2023-07-27 DIAGNOSIS — H16223 Keratoconjunctivitis sicca, not specified as Sjogren's, bilateral: Secondary | ICD-10-CM | POA: Diagnosis not present

## 2023-07-27 DIAGNOSIS — G40909 Epilepsy, unspecified, not intractable, without status epilepticus: Secondary | ICD-10-CM | POA: Diagnosis not present

## 2023-07-27 DIAGNOSIS — Z961 Presence of intraocular lens: Secondary | ICD-10-CM | POA: Diagnosis not present

## 2023-07-27 DIAGNOSIS — H524 Presbyopia: Secondary | ICD-10-CM | POA: Diagnosis not present

## 2023-07-27 NOTE — Progress Notes (Signed)
Chief Complaint: Patient presents today with   Follow-up on cholesterol      Tammy [name] If you are reviewing this progress note and have questions about the meaning or medical terms being used, please schedule an appointment or bring it up at your next follow-up appointment. Medical notes are meant to be a communication tool between medical professionals and require medical terms to be used for efficiency.      - Voice recognition software may have been used in portions of this report.   Inadvertent computerized transcription errors may be present despite efforts.    Portions of this note were dictated using [employer]. Omissions, "sound-alikes" and sentence fragmentation are unintentional.    Hyperlipidemia,    management discussed with patient, shehas  been working on  diet, she has  been doing regular exercise. Patient verbalised understanding about the significance for regular exercise and low fat diet in management of this problem      Lab Results   Lab Name Value Date/Time    CHOL 194 07/21/2023 07:44 AM    CHOL 188 05/09/2014 09:44 AM    LDLC 111 (H) 07/21/2023 07:44 AM    LDLC 107 05/09/2014 09:44 AM    HDL 65 07/21/2023 07:44 AM    HDL 59 05/09/2014 09:44 AM    TRIG 90 07/21/2023 07:44 AM    TRIG 108 05/09/2014 09:44 AM     Hypertension: Patient has been checking blood pressure  .she does  take medications regularly due to havaing  blood pressure  Current cardiovascular symptoms are none.   Lab Results   Lab Name Value Date/Time    NA 142 07/21/2023 07:44 AM    NA 139 05/09/2014 09:44 AM    K 3.8 07/21/2023 07:44 AM    K 3.9 05/09/2014 09:44 AM    CL 107 07/21/2023 07:44 AM    CL 106 05/09/2014 09:44 AM    CO2 25 07/21/2023 07:44 AM    CO2 27 05/09/2014 09:44 AM    BUN 14 07/21/2023 07:44 AM    BUN 15 05/09/2014 09:44 AM    CR 0.82 07/21/2023 07:44 AM    CR 0.69 05/09/2014 09:44 AM    GLU 85 07/21/2023 07:44 AM    GLU 96 05/09/2014 09:44 AM     No symptoms of hypo or hyperthyroidism: no decreased or  increased weight, no feeling cold/chilly or excessively warm, no diarrhea or constipation, no undue sweatiness, anxiety or palpitations.     Lab Results   Lab Name Value Date/Time    TSH 1.60 07/21/2023 07:44 AM    TSH 1.89 05/09/2014 09:44 AM    FT4 1.27 06/17/2009 08:16 AM        Fall risks Assessment:   No falls in past year. No throw rugs, poorly lighted areas, missing rails/grab bars in the home. No medications or conditions that affect stability or mobility.     The patient /  surrogate was counseled about the importance of Advance Care Planning. In addition:  The patient/surrogate and I reviewed ACP Documents. The documents in EMR are up to date.       Ros  complete review of symptons was done all other systems are negative except as otherwise stated in HPI    Vs  BP 124/70 (SITE: left arm, Orthostatic Position: sitting, Cuff Size: regular)   Pulse 68   Temp 36.3 C (97.4 F) (Temporal)   Resp 16   Ht 1.676 m (5\' 6" )  Wt 74.4 kg (164 lb)   SpO2 96%   BMI 26.47 kg/m     Physical exam -  General Appearance: healthy, alert, no distress, pleasant affect, cooperative.  Eyes:  conjunctivae and corneas clear. PERRL, EOM's intact. sclerae normal.  Ears:  external inspection of ears show no abnormality.  Lungs: no chest deformities noted .lungs clear to auscultation.  Heart:  normal rate and regular rhythm, no murmurs,  Extremities:  no cyanosis, clubbing, or edema.    ASSESMENT AND PLAN     (E78.00) Pure hypercholesterolemia  Comment:   Plan: Vitamin D, 25 Hydroxy, Magnesium (Mg), CBC with        Differential, Comprehensive Metabolic Panel,         Lipid Panel, Thyroid Stimulating Hormone      (Z23) Need for influenza vaccination  (primary encounter diagnosis)  Comment:   Plan: Influenza vaccine given    (E03.9) Acquired hypothyroidism  Comment:   Plan: Thyroid Stimulating Hormone                 I did review patient's past medical and family/social history, no changes noted.  Barriers to Learning assessed:  none. Patient verbalizes understanding of teaching and instructions.  Zettie Cooley MD      Electronically Signed By:  Zettie Cooley, MD  Physician associate   [facility] [place]  [phone]

## 2023-07-27 NOTE — Nursing Note (Signed)
Patient is here to follow up on labs. Patient is here with self.  Tammy [name] has been screened for pain and falls. Asked patient if they have been seen by any other physicians or been hospitalized since last visit to clinic, two identifiers have been used, name and date of birth. Eldridge Abrahams, MA

## 2023-07-28 DIAGNOSIS — F329 Major depressive disorder, single episode, unspecified: Secondary | ICD-10-CM | POA: Diagnosis not present

## 2023-07-28 DIAGNOSIS — R053 Chronic cough: Secondary | ICD-10-CM | POA: Diagnosis not present

## 2023-08-02 DIAGNOSIS — K219 Gastro-esophageal reflux disease without esophagitis: Secondary | ICD-10-CM | POA: Diagnosis not present

## 2023-08-02 DIAGNOSIS — R159 Full incontinence of feces: Secondary | ICD-10-CM | POA: Diagnosis not present

## 2023-08-02 DIAGNOSIS — R053 Chronic cough: Secondary | ICD-10-CM | POA: Diagnosis not present

## 2023-08-02 DIAGNOSIS — I10 Essential (primary) hypertension: Secondary | ICD-10-CM | POA: Diagnosis not present

## 2023-08-02 DIAGNOSIS — R1312 Dysphagia, oropharyngeal phase: Secondary | ICD-10-CM | POA: Diagnosis not present

## 2023-08-04 DIAGNOSIS — R002 Palpitations: Secondary | ICD-10-CM | POA: Diagnosis not present

## 2023-08-12 ENCOUNTER — Other Ambulatory Visit: Payer: Self-pay | Admitting: Pulmonary Disease

## 2023-08-12 DIAGNOSIS — I10 Essential (primary) hypertension: Secondary | ICD-10-CM | POA: Diagnosis not present

## 2023-08-12 DIAGNOSIS — R002 Palpitations: Secondary | ICD-10-CM | POA: Diagnosis not present

## 2023-08-12 DIAGNOSIS — I214 Non-ST elevation (NSTEMI) myocardial infarction: Secondary | ICD-10-CM | POA: Diagnosis not present

## 2023-08-12 DIAGNOSIS — R079 Chest pain, unspecified: Secondary | ICD-10-CM | POA: Diagnosis not present

## 2023-08-19 DIAGNOSIS — F329 Major depressive disorder, single episode, unspecified: Secondary | ICD-10-CM | POA: Diagnosis not present

## 2023-08-19 DIAGNOSIS — K219 Gastro-esophageal reflux disease without esophagitis: Secondary | ICD-10-CM | POA: Diagnosis not present

## 2023-08-19 DIAGNOSIS — R413 Other amnesia: Secondary | ICD-10-CM | POA: Diagnosis not present

## 2023-08-20 DIAGNOSIS — D3000 Benign neoplasm of unspecified kidney: Secondary | ICD-10-CM | POA: Diagnosis not present

## 2023-08-25 DIAGNOSIS — D1771 Benign lipomatous neoplasm of kidney: Secondary | ICD-10-CM | POA: Diagnosis not present

## 2023-08-25 DIAGNOSIS — N281 Cyst of kidney, acquired: Secondary | ICD-10-CM | POA: Diagnosis not present

## 2023-08-25 DIAGNOSIS — N2889 Other specified disorders of kidney and ureter: Secondary | ICD-10-CM | POA: Diagnosis not present

## 2023-08-25 DIAGNOSIS — D3000 Benign neoplasm of unspecified kidney: Secondary | ICD-10-CM | POA: Diagnosis not present

## 2023-08-27 DIAGNOSIS — K080 Exfoliation of teeth due to systemic causes: Secondary | ICD-10-CM | POA: Diagnosis not present

## 2023-08-30 DIAGNOSIS — D3001 Benign neoplasm of right kidney: Secondary | ICD-10-CM | POA: Diagnosis not present

## 2023-08-30 DIAGNOSIS — N3020 Other chronic cystitis without hematuria: Secondary | ICD-10-CM | POA: Diagnosis not present

## 2023-09-01 DIAGNOSIS — G894 Chronic pain syndrome: Secondary | ICD-10-CM | POA: Diagnosis not present

## 2023-09-01 DIAGNOSIS — M5416 Radiculopathy, lumbar region: Secondary | ICD-10-CM | POA: Diagnosis not present

## 2023-09-01 DIAGNOSIS — M48061 Spinal stenosis, lumbar region without neurogenic claudication: Secondary | ICD-10-CM | POA: Diagnosis not present

## 2023-09-01 DIAGNOSIS — M47816 Spondylosis without myelopathy or radiculopathy, lumbar region: Secondary | ICD-10-CM | POA: Diagnosis not present

## 2023-09-08 DIAGNOSIS — F419 Anxiety disorder, unspecified: Secondary | ICD-10-CM | POA: Diagnosis not present

## 2023-09-15 DIAGNOSIS — R1312 Dysphagia, oropharyngeal phase: Secondary | ICD-10-CM | POA: Diagnosis not present

## 2023-09-15 DIAGNOSIS — R053 Chronic cough: Secondary | ICD-10-CM | POA: Diagnosis not present

## 2023-09-15 DIAGNOSIS — K219 Gastro-esophageal reflux disease without esophagitis: Secondary | ICD-10-CM | POA: Diagnosis not present

## 2023-09-22 DIAGNOSIS — K224 Dyskinesia of esophagus: Secondary | ICD-10-CM | POA: Diagnosis not present

## 2023-09-22 DIAGNOSIS — K449 Diaphragmatic hernia without obstruction or gangrene: Secondary | ICD-10-CM | POA: Diagnosis not present

## 2023-09-22 DIAGNOSIS — R1310 Dysphagia, unspecified: Secondary | ICD-10-CM | POA: Diagnosis not present

## 2023-09-30 ENCOUNTER — Ambulatory Visit: Payer: Medicare Other | Admitting: Pulmonary Disease

## 2023-10-01 DIAGNOSIS — K080 Exfoliation of teeth due to systemic causes: Secondary | ICD-10-CM | POA: Diagnosis not present

## 2023-10-06 DIAGNOSIS — M48061 Spinal stenosis, lumbar region without neurogenic claudication: Secondary | ICD-10-CM | POA: Diagnosis not present

## 2023-10-06 DIAGNOSIS — F419 Anxiety disorder, unspecified: Secondary | ICD-10-CM | POA: Diagnosis not present

## 2023-10-06 DIAGNOSIS — M47816 Spondylosis without myelopathy or radiculopathy, lumbar region: Secondary | ICD-10-CM | POA: Diagnosis not present

## 2023-10-08 ENCOUNTER — Encounter: Payer: Self-pay | Admitting: FAMILY PRACTICE

## 2023-10-08 ENCOUNTER — Ambulatory Visit: Payer: Self-pay | Admitting: Internal Medicine

## 2023-10-08 ENCOUNTER — Ambulatory Visit
Admission: RE | Admit: 2023-10-08 | Discharge: 2023-10-08 | Disposition: A | Discharge status: Home / Self Care | Payer: Medicare Other | Source: Ambulatory Visit | Attending: FAMILY PRACTICE | Admitting: FAMILY PRACTICE

## 2023-10-08 ENCOUNTER — Ambulatory Visit (INDEPENDENT_AMBULATORY_CARE_PROVIDER_SITE_OTHER): Payer: Medicare Other | Admitting: FAMILY PRACTICE

## 2023-10-08 ENCOUNTER — Telehealth: Payer: Self-pay | Admitting: Internal Medicine

## 2023-10-08 VITALS — BP 143/73 | HR 67 | Temp 97.6°F | Ht 66.0 in | Wt 169.1 lb

## 2023-10-08 DIAGNOSIS — M25561 Pain in right knee: Secondary | ICD-10-CM

## 2023-10-08 DIAGNOSIS — M1711 Unilateral primary osteoarthritis, right knee: Secondary | ICD-10-CM

## 2023-10-08 MED ORDER — NAPROXEN 500 MG TABLET
500.0000 mg | ORAL_TABLET | Freq: Two times a day (BID) | ORAL | 1 refills | Status: DC
Start: 2023-10-08 — End: 2024-05-11

## 2023-10-08 NOTE — Progress Notes (Signed)
 Chief Complaint   Patient presents with    Knee Pain     Right knee pain and swelling. X 1 over a week and a half.      SUBJECTIVE:    Tammy [name] is a 82yr old female     Complains of R knee pain for the past 10 days.  .   Onset:  insideous, however, has     Severity is moderate to severe,      causing problems or difficulty with work, activities, or chores.    She's having posterior knee pain, and whole knee pain.       The pain is aggravated by "Everything".   Stairs.      There is no history of previous injury to this area.  There is no history of previous surgery to this area.    + swelling   no instability.     no locking.   no shooting pains, weakness, or numbing/tingling.        She notes she is taking advil OTC 4 times per day.           ROS - Patient denies any fever, chills, fatigue or other constitutional symptoms    OBJECTIVE:  BP (!) 143/73 (SITE: right arm, Orthostatic Position: sitting, Cuff Size: regular)   Pulse 67   Temp 36.4 C (97.6 F) (Temporal)   Ht 1.676 m (5\' 6" )   Wt 76.7 kg (169 lb 1.5 oz)   SpO2 97%   BMI 27.29 kg/m   GEN:  +++ antalgic gait noted, minimally weight bearing on leg.   INSPECTION:  + effusion.     No obvious deformity, bruising,   PALPATION:    Diffuse tenderness including posterior and joint line.   .  No crepitus.   +posterior swelling     KNEE ROM: moderately reduced flexion. .     STRENGTH:   Motor strength is limited due to pain.   Collateral Ligaments:  too painful to assess.   MacMurray: too painful to assess   Lachmann: too painful to assess .     Posterior Drawer: negative.  PATELLA: normal tracking and apprehension    Xray:  initial intepretation - no obvious fracture. +swelling,  +vascular calcifications     ASSESSMENT & PLAN:    1. Acute pain of right knee      Severe knee pain  x 10 days without specific incident.     ?acute meniscu s tear.     However, given posterior fullness, recommend eval for baker's cyst.     Attempted hinged knee brace, however  not especially helpful. Discussed knee immobilizer + crutches. She declines, saying she doesn't want them.     Recommend icing and avoiding bothersome activities.   Range of Motion and Home Exercises discussed.      Pain Control:  aleve.    Bracing:   hinged knee brace.          Return to clinic if symptoms worsen or not improving.     Orders Placed This Encounter    Durable Medical Equipment    KNEE 3 VIEWS, RIGHT    US SOFT TISSUE EXTREMITY (NONVASCULAR)    ORTHOPEDIC-GENERAL REFERRAL    Naproxen (NAPROSYN) 500 mg Tablet       Patient verbalizes understanding of teaching and instructions.    electronically signed by   Alric Seton, MD  [facility]   [facility], [place]  Phone: [phone]  [phone]    Tammy [name], (patient): If you are reviewing this progress note and have questions about the meaning or medical terms being used, please schedule an appointment or bring it up at your next follow-up appointment. Medical notes are meant to be a communication tool between medical professionals and require medical terms to be used for efficiency.      - Voice recognition software may have been used in portions of this report.   Inadvertent computerized transcription errors may be present despite efforts.

## 2023-10-08 NOTE — Telephone Encounter (Signed)
 Referral Request:    Patient is calling to request updated URGENT referral to a doctor at [facility] regarding a problem of Rt knee. She requests a referral her insurance is Medicare A&B and Tricare for Life. Pt called to schedule the appt with Dr, Sylvester Evert is not available as she is leaving for vacation and advised pt to go to  [place].      [[facility]:  Review if referral already exists. If so, let the patient know the status of the referral. MA:  Has patient been seen for this problem?  If yes, route to provider.  If not, request they make an appointment.]    Goble Last, [facility]

## 2023-10-08 NOTE — Telephone Encounter (Signed)
 Xray has not been read as soon as the xray is final, the referral will be sent to [facility]  [facility] Referral Coordinator  [facility]  Ph: [phone]  Fax: [phone]  Pt. Line: [phone]

## 2023-10-08 NOTE — Telephone Encounter (Signed)
 3 patient identifiers used.  Per:   patient.    Disposition: SEE HCP (OR PCP TRIAGE) WITHIN 4 HOURS   Advice given per protocol  Appointment given per protocol  Future Appointments   Date Time Provider Department Center   10/08/2023 10:40 AM Vassie Loll, MD [facility]     Per:   patient verbalizes agreement to plan. Agrees to callback with any increase in symptoms/concerns or questions.    See Assessment Below  Arthritic right knee. Has never been this bad before. Cortisone shot helped her when it was left knee. Right knee pain for a week.     Rest, ice, heat, ibuprofen. Swollen. Pain is excruciating behind the knee. Throbbing is going down to ankle.     OV opened up from a cancellation in clinic, patient will see Dr. Leavy Cella.     See Care Advice Below  Care Advice            Knee Fransisca Kaufmann, RN Fri Oct 08, 2023 09:01 AM      Care Advice       SEE HCP (OR PCP TRIAGE) WITHIN 4 HOURS:  * IF OFFICE WILL BE OPEN: You need to be seen within the next 3 or 4 hours. Call your doctor (or NP/PA) now or as soon as the office opens.  * IF OFFICE WILL BE CLOSED AND NO PCP (PRIMARY CARE PROVIDER) SECOND-LEVEL TRIAGE: You need to be seen within the next 3 or 4 hours. A nearby Urgent Care Center [facility]) is often a good source of care. Another choice is to go to the ED. Go sooner if you become worse.  * IF OFFICE WILL BE CLOSED AND PCP SECOND-LEVEL TRIAGE REQUIRED: You may need to be seen. Your doctor (or NP/PA) will want to talk with you to decide what's best. I'll page the on-call provider now. If you haven't heard from the provider (or me) within 30 minutes, call again. NOTE: If on-call provider can't be reached, send to [facility] or ED.    NOTE TO TRIAGER:  * Use nurse judgment to select the most appropriate source of care.  * Consider both the urgency of the patient's symptoms AND what resources may be needed to evaluate and manage the patient.    SOURCES OF CARE:  * ED: Patients who may need surgery or  hospital admission need to be sent to an ED. So do most patients with serious symptoms or complex medical problems.  * UCC: Some UCCs can manage patients who are stable and have less serious symptoms (e.g., minor illnesses and injuries). The triager must know the [facility] capabilities before sending a patient there. If unsure, call ahead.  * OFFICE: If patient sounds stable and not seriously ill, consult PCP (or follow your office policy) to see if patient can be seen NOW in office.    CALL BACK IF:  * You become worse                            See Protocol and Disposition Below  Reason for Disposition   [1] SEVERE pain (e.g., excruciating, unable to walk) AND [2] not improved after 2 hours of pain medicine    Additional Information   Negative: Sounds like a life-threatening emergency to the triager   Negative: [1] Swollen joint AND [2] fever   Negative: [1] Red area or streak AND [2] fever   Negative: Patient  sounds very sick or weak to the triager    Protocols used: Knee Pain-ADULT-AH

## 2023-10-08 NOTE — Patient Instructions (Signed)
[facility]   [address]   [place], [place] [address]   Phone: [phone]   Fax: [phone]

## 2023-10-08 NOTE — Nursing Note (Signed)
 Tammy [name] here for right knee pain  Vital signs taken, allergies verified, screened for pain, pharmacy verified, and verified immunization status.  Patient is accompanied by self.     Corena Pilgrim, MA

## 2023-10-11 ENCOUNTER — Other Ambulatory Visit: Payer: Self-pay | Admitting: Diagnostic Neuroimaging

## 2023-10-11 ENCOUNTER — Telehealth: Payer: Self-pay | Admitting: Internal Medicine

## 2023-10-11 NOTE — Telephone Encounter (Signed)
 Patient was provided a DME hinged knee brace during Dr. Gene Kemps visit on 10/08/23. Patient left without signing the Patient Product Agreement Form. I reached out to patient that afternoon to inform patient of this form in hopes she could stop by the office on Monday. Patient stated she was in a lot of pain and was upset with the process of her referral to [employer] and ended the call. I reached out to patient again 10 minutes ago to follow up. She stated she is aware of the form of the knee brace but was more focused on the [facility] Referral and trying to get seen today. Patient became upset again. I stated I was going to end the call as the patient was yelling. Patient ended the phone call.

## 2023-10-11 NOTE — Telephone Encounter (Signed)
Patient sees Dr. Langston Masker at [facility]. Please advise pharmacy or patient to request refills for seizure medication from Dr. Lilyan Punt office.

## 2023-10-11 NOTE — Telephone Encounter (Signed)
Patient's last appointment was on 04/27/2022. She does not have a follow up appointment scheduled.  Note to pharmacy added. Patient will need appointment for further refills. A MyChart message has been sent to the patient to schedule an appointment for further refills.

## 2023-10-21 ENCOUNTER — Ambulatory Visit: Payer: Medicare Other | Admitting: Orthopaedic Surgery

## 2023-10-21 DIAGNOSIS — M1711 Unilateral primary osteoarthritis, right knee: Secondary | ICD-10-CM

## 2023-10-21 NOTE — Progress Notes (Signed)
Name: Tammy [name]  MRN: [id number]  DOB: [birth year]  AGE: 33yr  DATE: 10/21/2023    ORTHO CLINIC  PROGRESS NOTE    S:  83 year old female here for re-evaluation of right knee pain.  Pain is severe, throughout, radiates down leg.  I initially saw her for this 10 years ago in September 2015 when right knee corticosteroid injection was done that did improve her symptoms.  Had recurrence of the knee pain 4 weeks ago, gradual insidious onset with no specific injury or inciting event.  Worse with a everything.  Better with nothing.  Has tried taking naproxen and Advil and using over-the-counter topicals including diclofenac 1% gel.  Has also been resting, icing.  Tried knee brace that did not help.  Has had buckling.  No recent physical therapy.  Using cane for ambulation.  Pain score 10/10.  No fevers or chills.  Activities include yard work.  I last saw patient in April 07, 2020 at which time left knee corticosteroid injection was done that did improve her left knee pain.    From 07/17/2014 note: "Tammy [name] is a 23yr RHD retired female with right knee pain located posterior.  Onset: about 1 year ago, worse with unknown and is relieved with ice and heat. Pain quality:aching, sharp, throbbing. Strength or sensation changes:none.  Patient does not have symptoms of catching, clicking, locking, or popping. No buckling, but knee feels unstable.  Patient has pain with stairs and sitting for prolonged periods of time and getting in and out of car.  Medications used for this pain includes tylenol and ibuprofen with some benefit.  Previous treatments have included no injections, + therapy and ice.  Patient's current activity level consists of ADL's. Knee pain has improved. Pain 5/10. + swelling. "    Patient Active Problem List   Diagnosis    Hypothyroidism    ROUTINE MEDICAL EXAM    OTHER MALAISE AND FATIGUE    left supraclavicular fullness    LOSS OF WEIGHT    PURE HYPERCHOLESTEROLEMIA    PE (pulmonary embolism)    Atrial  fibrillation    Long term current use of anticoagulant therapy    PE (physical exam)    BMI 28.0-28.9,adult    Vitamin D deficiency       Past Surgical History:   Procedure Laterality Date    COLONOSCOPY      declined in past    NO SURGICAL HISTORY         Allergies   Allergen Reactions    Enoxaparin Rash     When she had PE, diffuse rash, biopsied         Current Outpatient Medications on File Prior to Visit   Medication Sig Dispense Refill    Atorvastatin (LIPITOR) 40 mg tablet TAKE 1 TABLET DAILY 90 tablet 3    Clopidogrel (PLAVIX) 75 mg Tablet TAKE 1 TABLET EVERY MORNING 90 tablet 3    LevoTHYROxine 112 mcg Tablet TAKE 1 TABLET DAILY FOR THYROID 90 tablet 3    Naproxen (NAPROSYN) 500 mg Tablet Take 1 tablet by mouth 2 times daily with meals. 60 tablet 1     No current facility-administered medications on file prior to visit.     Social History     Socioeconomic History    Marital status: MARRIED    Number of children: 2   Occupational History    Occupation: used to work with animals   Tobacco Use    Smoking status:  Never    Smokeless tobacco: Never   Vaping Use    Vaping status: Never Used   Substance and Sexual Activity    Alcohol use: No    Drug use: No     Exam:  BP 120/68 (SITE: right arm, Orthostatic Position: sitting, Cuff Size: regular)   Pulse 90   Temp (!) 35.9 C (96.7 F) (Skin)   Resp 14   Ht 1.676 m (5\' 6" )   Wt 75.3 kg (166 lb 0.1 oz)   SpO2 99%   BMI 26.79 kg/m   Appearance: in no apparent distress, alert, oriented times 3, well groomed and dressed and cooperative, mood appropriate, responds appropriately to spoken word with non labored breathing.  Here with friend.  HEENT: NC/AT  CV: pulses intact  Resp: unlabored breathing  Skin: no excoriations, lacerations, or rashes  Gait:  Antalgic    Msk:   right Knee exam  Upon inspection, there is no obvious deformity or ecchymosis.  ROM: 5 - 100  Patellofemoral crepitus +  Effusion mild  Medial joint line tenderness + Lateral joint line  tenderness +  Motor in LE: 5/5 EHL/TA/GS/Quad/Hamstring  Normal sensation L2- S1 distribution  Warm and well perfused    Imaging: Radiographs obtained 10/08/2023 and radiology report that I independently reviewed of right knee demonstrate severe right knee osteoarthritis with progression as compared to previous x-rays, lateral patellar subluxation, chondrocalcinosis    A:  83 year old female with right knee osteoarthritis    P:  Given the patient's history, physical exam, and radiographs, the patient has findings consistent with osteoarthritis of the right knee. Conservative treatment options to include activity modification, icing, medication, bracing and therapy.  The patient also understands that  they may undergo injectible medication to include viscosupplementation or steroid, depending on the severity of their arthritis.  Finally the patient understands that they may elect to undergo knee replacement surgery if conservative management fails.  Patient would like to proceed with another right knee corticosteroid injection today.  Under sterile preparation, I injected the right Knee with 2cc of 1% lidocaine, 2cc of 0.2% ropivicaine, and 1 cc of 40mg /ml kenalog through an inferolateral approach.  The patient tolerated the procedure well and was advised of a flare reaction.  The patient is to limit her activity tonight and gradually increase as tolerated.  Call or return to clinic prn if these symptoms recur, worsen or fail to improve as anticipated.    Electronically signed by:  Lind Covert, MD  Orthopaedic Surgery

## 2023-10-21 NOTE — Nursing Note (Signed)
Vital signs taken, allergies verified, screened for pain, medications reviewed, chief complaint noted, tobacco use reviewVital signs taken..  Mark Horger.

## 2023-10-25 DIAGNOSIS — F322 Major depressive disorder, single episode, severe without psychotic features: Secondary | ICD-10-CM | POA: Diagnosis not present

## 2023-10-25 DIAGNOSIS — J069 Acute upper respiratory infection, unspecified: Secondary | ICD-10-CM | POA: Diagnosis not present

## 2023-10-25 DIAGNOSIS — J4521 Mild intermittent asthma with (acute) exacerbation: Secondary | ICD-10-CM | POA: Diagnosis not present

## 2023-10-25 DIAGNOSIS — G40209 Localization-related (focal) (partial) symptomatic epilepsy and epileptic syndromes with complex partial seizures, not intractable, without status epilepticus: Secondary | ICD-10-CM | POA: Diagnosis not present

## 2023-10-26 ENCOUNTER — Ambulatory Visit: Admission: RE | Admit: 2023-10-26 | Payer: Medicare Other | Source: Ambulatory Visit

## 2023-10-28 DIAGNOSIS — R195 Other fecal abnormalities: Secondary | ICD-10-CM | POA: Diagnosis not present

## 2023-11-01 ENCOUNTER — Ambulatory Visit: Payer: Medicare Other | Admitting: Orthopaedic Surgery

## 2023-11-11 DIAGNOSIS — K449 Diaphragmatic hernia without obstruction or gangrene: Secondary | ICD-10-CM | POA: Diagnosis not present

## 2023-11-11 DIAGNOSIS — K219 Gastro-esophageal reflux disease without esophagitis: Secondary | ICD-10-CM | POA: Diagnosis not present

## 2023-11-11 DIAGNOSIS — K5900 Constipation, unspecified: Secondary | ICD-10-CM | POA: Diagnosis not present

## 2023-11-11 DIAGNOSIS — R1310 Dysphagia, unspecified: Secondary | ICD-10-CM | POA: Diagnosis not present

## 2023-11-11 DIAGNOSIS — K921 Melena: Secondary | ICD-10-CM | POA: Diagnosis not present

## 2023-11-12 DIAGNOSIS — K080 Exfoliation of teeth due to systemic causes: Secondary | ICD-10-CM | POA: Diagnosis not present

## 2023-11-24 DIAGNOSIS — F419 Anxiety disorder, unspecified: Secondary | ICD-10-CM | POA: Diagnosis not present

## 2023-11-25 DIAGNOSIS — M5416 Radiculopathy, lumbar region: Secondary | ICD-10-CM | POA: Diagnosis not present

## 2023-11-25 DIAGNOSIS — M461 Sacroiliitis, not elsewhere classified: Secondary | ICD-10-CM | POA: Diagnosis not present

## 2023-11-25 DIAGNOSIS — M47816 Spondylosis without myelopathy or radiculopathy, lumbar region: Secondary | ICD-10-CM | POA: Diagnosis not present

## 2023-12-02 DIAGNOSIS — M542 Cervicalgia: Secondary | ICD-10-CM | POA: Diagnosis not present

## 2023-12-02 DIAGNOSIS — R519 Headache, unspecified: Secondary | ICD-10-CM | POA: Diagnosis not present

## 2023-12-02 DIAGNOSIS — R0789 Other chest pain: Secondary | ICD-10-CM | POA: Diagnosis not present

## 2023-12-02 DIAGNOSIS — R5383 Other fatigue: Secondary | ICD-10-CM | POA: Diagnosis not present

## 2023-12-02 DIAGNOSIS — R9431 Abnormal electrocardiogram [ECG] [EKG]: Secondary | ICD-10-CM | POA: Diagnosis not present

## 2023-12-02 DIAGNOSIS — R058 Other specified cough: Secondary | ICD-10-CM | POA: Diagnosis not present

## 2023-12-02 DIAGNOSIS — I10 Essential (primary) hypertension: Secondary | ICD-10-CM | POA: Diagnosis not present

## 2023-12-02 DIAGNOSIS — R079 Chest pain, unspecified: Secondary | ICD-10-CM | POA: Diagnosis not present

## 2023-12-02 DIAGNOSIS — R0602 Shortness of breath: Secondary | ICD-10-CM | POA: Diagnosis not present

## 2023-12-06 DIAGNOSIS — I700 Atherosclerosis of aorta: Secondary | ICD-10-CM | POA: Diagnosis not present

## 2023-12-06 DIAGNOSIS — K219 Gastro-esophageal reflux disease without esophagitis: Secondary | ICD-10-CM | POA: Diagnosis not present

## 2023-12-06 DIAGNOSIS — Z0000 Encounter for general adult medical examination without abnormal findings: Secondary | ICD-10-CM | POA: Diagnosis not present

## 2023-12-06 DIAGNOSIS — I25119 Atherosclerotic heart disease of native coronary artery with unspecified angina pectoris: Secondary | ICD-10-CM | POA: Diagnosis not present

## 2023-12-06 DIAGNOSIS — F322 Major depressive disorder, single episode, severe without psychotic features: Secondary | ICD-10-CM | POA: Diagnosis not present

## 2023-12-07 ENCOUNTER — Other Ambulatory Visit: Payer: Self-pay | Admitting: Family Medicine

## 2023-12-07 ENCOUNTER — Other Ambulatory Visit: Payer: Self-pay | Admitting: Internal Medicine

## 2023-12-07 DIAGNOSIS — Z780 Asymptomatic menopausal state: Secondary | ICD-10-CM

## 2023-12-09 NOTE — Telephone Encounter (Signed)
 Pharmacy Refill Optimization (PRO)    Refill authorized per P&T Protocol 12/09/2023    .   Meets Epic Protocol: YES, as determined by Epic (See Protocol Details for specific information)      Med last reviewed 01/26/23    ====================================================================    Medication name: LEVOTHYROXINE  Labs (if required by protocol):   Lab Results   Component Value Date    TSH 1.60 07/21/2023    TSH 3.54 01/19/2023    FT4 1.27 06/17/2009    FT4 1.52 03/05/2009

## 2023-12-22 ENCOUNTER — Ambulatory Visit: Admitting: Orthopaedic Surgery

## 2023-12-22 DIAGNOSIS — M1711 Unilateral primary osteoarthritis, right knee: Secondary | ICD-10-CM

## 2023-12-22 NOTE — Nursing Note (Signed)
 Patient identified by name and date of birth, and address.Chief complaint taken, allergies reviewed, medication and pharmacy verified, vitals performed, vaccine record reviewed, and care gaps addressed with patient.  Also asked if they have been hospitalized since last visit, patient denies being hospitalized since last office visit. Patient here today for right knee pain . Patient here today with self.  Milburn Freeney Lankin MA II

## 2023-12-22 NOTE — Progress Notes (Signed)
 Name: Tammy [name]  MRN: [id number]  DOB: 01-May-1941  AGE: 16yr  DATE: 12/22/2023    ORTHO CLINIC  PROGRESS NOTE    S:  83 year old female following up on right knee pain.  Patient last seen 2 months ago on 10/21/2023 at which time another right knee corticosteroid injection was done that did improve her knee pain and symptoms a lot, about 50% until about 2 weeks ago when she started having worsening of the pain, although still not as severe as it was not in the past.  Pain is located posterior, now throughout like it has been prior to the injection.  Also had buckling, occurred while she was stepping up.  Symptoms are worse with increased activity, getting up from a chair, steps and stairs.  Better with sitting and rest.  Tried taking the naproxen that did not help, has returned to taking the Advil around the clock.  Tried the Voltaren gel that also did not help.  Has not needed to use the cane as much.  Doing home exercises on her own, using pillow when she was sleeping that helps.  Has a knee brace that she uses.  Pain score 1/10.  Dull ache.  Daughter had encouraged her to come in for evaluation because of the recurrence of the knee pain and she had waited too long to be seen last time.  On Plavix for pulmonary embolism 15 years ago.  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     From last note 10/21/2023: " here for re-evaluation of right knee pain.  Pain is severe, throughout, radiates down leg.  I initially saw her for this 10 years ago in September 2015 when right knee corticosteroid injection was done that did improve her symptoms.  Had recurrence of the knee pain 4 weeks ago, gradual insidious onset with no specific injury or inciting event.  Worse with a everything.  Better with nothing.  Has tried taking naproxen and Advil and using over-the-counter topicals including diclofenac 1% gel.  Has also been resting, icing.  Tried knee brace that did not help.  Has had buckling.  No recent physical therapy.  Using  cane for ambulation.  Pain score 10/10.  No fevers or chills.  Activities include yard work.  I last saw patient in April 07, 2020 at which time left knee corticosteroid injection was done that did improve her left knee pain."    From 07/17/2014 note: "Tammy [name] is a 21yr RHD retired female with right knee pain located posterior.  Onset: about 1 year ago, worse with unknown and is relieved with ice and heat. Pain quality:aching, sharp, throbbing. Strength or sensation changes:none.  Patient does not have symptoms of catching, clicking, locking, or popping. No buckling, but knee feels unstable.  Patient has pain with stairs and sitting for prolonged periods of time and getting in and out of car.  Medications used for this pain includes tylenol and ibuprofen with some benefit.  Previous treatments have included no injections, + therapy and ice.  Patient's current activity level consists of ADL's. Knee pain has improved. Pain 5/10. + swelling. "    Exam:  Pulse 77   Temp 36.4 C (97.6 F) (Temporal)   Ht 1.676 m (5' 5.98")   Wt 75.3 kg (166 lb)   SpO2 98%   BMI 26.81 kg/m   Appearance: in no apparent distress, alert, oriented times 3, well groomed and dressed and cooperative, mood appropriate, responds appropriately to spoken word  with non labored breathing.  Here with friend.  HEENT: NC/AT  CV: pulses intact  Resp: unlabored breathing  Skin: no excoriations, lacerations, or rashes  Gait:  Antalgic    Msk:   right Knee exam  Upon inspection, there is no obvious deformity or ecchymosis.  ROM: 5 - 100  Patellofemoral crepitus +  Effusion mild  Medial joint line tenderness + Lateral joint line tenderness +  Motor in LE: 5/5 EHL/TA/GS/Quad/Hamstring  Normal sensation L2- S1 distribution  Warm and well perfused    Imaging: Radiographs obtained 10/08/2023 and radiology report that I independently reviewed of right knee demonstrate severe right knee osteoarthritis with progression as compared to previous x-rays, lateral  patellar subluxation, chondrocalcinosis    A:  83 year old female with severe right knee osteoarthritis, improved after corticosteroid injection done 2 months ago, with recent mild return of knee pain and symptoms    P:  Discussed treatment options.  Continue supportive care.  Can try taking Tylenol also in addition to the Advil.  Did discuss risks of taking Advil with the Plavix.  Offered patient prescription for Celebrex instead, patient declined at this time.  Follow up in 2 months for reassessment and possible repeat right knee corticosteroid injection at that time as too soon for repeat corticosteroid injection today.  If patient has worsening of the knee pain prior that, can follow up sooner to try right knee viscosupplementation injection.  Also discussed treatment options of referral to pain management to be evaluated for genicular nerve ablation procedure or to an ortho joint replacement surgeon to be evaluated for knee replacement surgery.  Patient not interested in proceeding with either at this time as she is caring for her husband.    Electronically signed by:  Lind Covert, MD  Orthopaedic Surgery

## 2023-12-23 DIAGNOSIS — I2510 Atherosclerotic heart disease of native coronary artery without angina pectoris: Secondary | ICD-10-CM | POA: Diagnosis not present

## 2023-12-23 DIAGNOSIS — I252 Old myocardial infarction: Secondary | ICD-10-CM | POA: Diagnosis not present

## 2023-12-23 DIAGNOSIS — I340 Nonrheumatic mitral (valve) insufficiency: Secondary | ICD-10-CM | POA: Diagnosis not present

## 2023-12-23 DIAGNOSIS — I255 Ischemic cardiomyopathy: Secondary | ICD-10-CM | POA: Diagnosis not present

## 2023-12-23 DIAGNOSIS — I341 Nonrheumatic mitral (valve) prolapse: Secondary | ICD-10-CM | POA: Diagnosis not present

## 2023-12-23 DIAGNOSIS — E782 Mixed hyperlipidemia: Secondary | ICD-10-CM | POA: Diagnosis not present

## 2023-12-23 DIAGNOSIS — Z955 Presence of coronary angioplasty implant and graft: Secondary | ICD-10-CM | POA: Diagnosis not present

## 2023-12-27 DIAGNOSIS — M461 Sacroiliitis, not elsewhere classified: Secondary | ICD-10-CM | POA: Diagnosis not present

## 2024-01-03 ENCOUNTER — Other Ambulatory Visit: Payer: Self-pay | Admitting: Internal Medicine

## 2024-01-03 DIAGNOSIS — E785 Hyperlipidemia, unspecified: Secondary | ICD-10-CM

## 2024-01-04 NOTE — Telephone Encounter (Signed)
 Pharmacy Refill Optimization (PRO)    Refill authorized per P&T Protocol 01/04/2024    .   Meets Epic Protocol: YES, as determined by Epic (See Protocol Details for specific information)    Reconciled on 01/26/2023  ====================================================================    Medication name: ATORVASTATIN   Labs (if required by protocol):   Lab Results   Component Value Date    CHOL 194 07/21/2023    CHOL 208 (H) 01/19/2023    LDLC 111 (H) 07/21/2023    LDLC 115 (H) 01/19/2023    HDL 65 07/21/2023    HDL 67 01/19/2023    TRIG 90 07/21/2023    TRIG 129 01/19/2023

## 2024-01-05 DIAGNOSIS — K2289 Other specified disease of esophagus: Secondary | ICD-10-CM | POA: Diagnosis not present

## 2024-01-05 DIAGNOSIS — I10 Essential (primary) hypertension: Secondary | ICD-10-CM | POA: Diagnosis not present

## 2024-01-05 DIAGNOSIS — K219 Gastro-esophageal reflux disease without esophagitis: Secondary | ICD-10-CM | POA: Diagnosis not present

## 2024-01-05 DIAGNOSIS — R1310 Dysphagia, unspecified: Secondary | ICD-10-CM | POA: Diagnosis not present

## 2024-01-05 DIAGNOSIS — J449 Chronic obstructive pulmonary disease, unspecified: Secondary | ICD-10-CM | POA: Diagnosis not present

## 2024-01-05 DIAGNOSIS — I2510 Atherosclerotic heart disease of native coronary artery without angina pectoris: Secondary | ICD-10-CM | POA: Diagnosis not present

## 2024-01-05 DIAGNOSIS — K449 Diaphragmatic hernia without obstruction or gangrene: Secondary | ICD-10-CM | POA: Diagnosis not present

## 2024-01-18 ENCOUNTER — Ambulatory Visit: Payer: Medicare Other

## 2024-01-20 ENCOUNTER — Ambulatory Visit: Attending: Internal Medicine

## 2024-01-20 DIAGNOSIS — E7800 Pure hypercholesterolemia, unspecified: Secondary | ICD-10-CM | POA: Insufficient documentation

## 2024-01-20 DIAGNOSIS — E039 Hypothyroidism, unspecified: Secondary | ICD-10-CM | POA: Insufficient documentation

## 2024-01-20 LAB — THYROID STIMULATING HORMONE: Thyroid Stimulating Hormone: 2.4 u[IU]/mL (ref 0.27–4.20)

## 2024-01-20 LAB — COMPREHENSIVE METABOLIC PANEL
Alanine Transferase (ALT): 11 U/L (ref <=33)
Albumin: 3.9 g/dL — ABNORMAL LOW (ref 4.0–4.9)
Alkaline Phosphatase (ALP): 60 U/L (ref 35–129)
Anion Gap: 9 mmol/L (ref 7–15)
Aspartate Transaminase (AST): 18 U/L (ref <=41)
Bilirubin Total: 0.5 mg/dL (ref <=1.2)
Calcium: 8.7 mg/dL (ref 8.6–10.0)
Carbon Dioxide Total: 26 mmol/L (ref 22–29)
Chloride: 104 mmol/L (ref 98–107)
Creatinine Serum: 0.77 mg/dL (ref 0.51–1.17)
E-GFR Creatinine (Female): 78 mL/min/{1.73_m2}
Glucose: 81 mg/dL (ref 74–109)
Potassium: 4.1 mmol/L (ref 3.4–5.1)
Protein: 6.2 g/dL — ABNORMAL LOW (ref 6.6–8.7)
Sodium: 139 mmol/L (ref 136–145)
Urea Nitrogen, Blood (BUN): 10 mg/dL (ref 6–20)

## 2024-01-20 LAB — CBC WITH DIFFERENTIAL
Basophils % Auto: 0.7 %
Basophils Abs Auto: 0 10*3/uL (ref 0.0–0.2)
Eosinophils % Auto: 4.1 %
Eosinophils Abs Auto: 0.2 10*3/uL (ref 0.0–0.5)
Hematocrit: 40 % (ref 36.0–46.0)
Hemoglobin: 13.4 g/dL (ref 12.0–16.0)
Lymphocytes % Auto: 35.4 %
Lymphocytes Abs Auto: 1.8 10*3/uL (ref 1.0–4.8)
MCH: 31.2 pg (ref 27.0–33.0)
MCHC: 33.5 % (ref 32.0–36.0)
MCV: 93 fL (ref 80.0–100.0)
MPV: 7.8 fL (ref 6.8–10.0)
Monocytes % Auto: 11.5 %
Monocytes Abs Auto: 0.6 10*3/uL (ref 0.1–0.8)
Neutrophils % Auto: 48.3 %
Neutrophils Abs Auto: 2.5 10*3/uL (ref 1.8–7.7)
Platelet Count: 288 10*3/uL (ref 130–400)
RDW: 14.3 % (ref 0.0–14.7)
Red Blood Cell Count: 4.31 10*6/uL (ref 4.00–5.20)
White Blood Cell Count: 5.1 10*3/uL (ref 4.5–11.0)

## 2024-01-20 LAB — LIPID PANEL
Cholesterol: 179 mg/dL (ref <200)
HDL Cholesterol: 57 mg/dL (ref >40)
LDL Cholesterol Calculation: 102 mg/dL — ABNORMAL HIGH (ref <100)
Non-HDL Cholesterol: 122 mg/dL (ref <=150)
Total Cholesterol: HDL Ratio: 3.1 (ref <4.0)
Triglyceride Level: 101 mg/dL (ref <150)

## 2024-01-20 LAB — VITAMIN D, 25 HYDROXY: Vitamin D, 25 Hydroxy: 23.4 ng/mL (ref 10.0–50.0)

## 2024-01-20 LAB — MAGNESIUM (MG): Magnesium (Mg): 2 mg/dL (ref 1.6–2.4)

## 2024-01-25 ENCOUNTER — Ambulatory Visit: Payer: Medicare Other | Admitting: Internal Medicine

## 2024-01-25 DIAGNOSIS — J449 Chronic obstructive pulmonary disease, unspecified: Secondary | ICD-10-CM | POA: Diagnosis not present

## 2024-01-25 DIAGNOSIS — G40909 Epilepsy, unspecified, not intractable, without status epilepticus: Secondary | ICD-10-CM | POA: Diagnosis not present

## 2024-01-25 DIAGNOSIS — R413 Other amnesia: Secondary | ICD-10-CM | POA: Diagnosis not present

## 2024-01-25 DIAGNOSIS — F32A Depression, unspecified: Secondary | ICD-10-CM | POA: Diagnosis not present

## 2024-01-26 ENCOUNTER — Encounter: Payer: Self-pay | Admitting: Internal Medicine

## 2024-01-26 ENCOUNTER — Ambulatory Visit: Payer: Medicare Other | Admitting: Internal Medicine

## 2024-01-26 ENCOUNTER — Telehealth: Payer: Self-pay | Admitting: Orthopaedic Surgery

## 2024-01-26 VITALS — BP 117/66 | HR 82 | Temp 97.4°F | Resp 16 | Ht 65.0 in | Wt 167.1 lb

## 2024-01-26 DIAGNOSIS — E039 Hypothyroidism, unspecified: Secondary | ICD-10-CM

## 2024-01-26 DIAGNOSIS — E785 Hyperlipidemia, unspecified: Secondary | ICD-10-CM

## 2024-01-26 DIAGNOSIS — M1711 Unilateral primary osteoarthritis, right knee: Secondary | ICD-10-CM

## 2024-01-26 NOTE — Telephone Encounter (Signed)
 General Advice / Message to MD:    Requesting advice about pt is calling in to see if dr.tseng to order hyaluronic acid inj as the cortisone did not work .   Pt stated she is not doing too well as pt is having pain  She requests advice and a phone call back    Suzette Battiest  [facility]  [place]: [phone]    .

## 2024-01-26 NOTE — Nursing Note (Signed)
 Tammy [name] is here today with self. Patient roomed, chief complaint taken, allergies verified, vitals obtained, and pharmacy verified. Health maintenance checked and updated, forms given if appropriate. Two identifiers have been used, name and date of birth. Eldridge Abrahams, MA

## 2024-01-26 NOTE — Progress Notes (Signed)
 Chief Complaint   Patient presents with    Cholesterol          I obtained verbal consent from the patient to use AI ambient technology to transcribe the interactions between the patient and myself during the clinical encounter.    SUBJECTIVE      Tammy [name] is a 83yr old female.    History of Present Illness  Tammy [name] is an 83 year old female with worsening right knee pain.    She experiences worsening right knee pain, The pain is described as throbbing, particularly severe in the mornings, and it disrupts her sleep. She received a corticosteroid injection on January 2nd, which provided minimal relief, but the pain has since returned. She takes ibuprofen every four hours, which provides partial relief, and uses Voltaren cream and another arthritis cream daily. Ice therapy is more effective for her than heat. She has not engaged in physical therapy but follows exercises given by Dr. Alta Corning she is unable to undergo knee replacement surgery at this time due to personal circumstances.    She uses a cane and a knee brace to aid mobility and prevent falls, which have occurred twice, exacerbating her knee pain. She is cautious with her movements, especially on stairs, to manage her symptoms.    Her husband's health has been a significant stressor, as he suffers from dementia and has recently stopped drinking, leading to hallucinations and a fall. He has lost significant weight and requires constant care, which limits her ability to leave the house for extended periods.         Results  Lab Results   Lab Name Value Date/Time    CHOL 179 01/20/2024 07:33 AM    CHOL 188 05/09/2014 09:44 AM    LDLC 102 (H) 01/20/2024 07:33 AM    LDLC 107 05/09/2014 09:44 AM    HDL 57 01/20/2024 07:33 AM    HDL 59 05/09/2014 09:44 AM    TRIG 101 01/20/2024 07:33 AM    TRIG 108 05/09/2014 09:44 AM     Lab Results   Lab Name Value Date/Time    NA 139 01/20/2024 07:33 AM    NA 139 05/09/2014 09:44 AM    K 4.1 01/20/2024 07:33 AM    K 3.9  05/09/2014 09:44 AM    CL 104 01/20/2024 07:33 AM    CL 106 05/09/2014 09:44 AM    CO2 26 01/20/2024 07:33 AM    CO2 27 05/09/2014 09:44 AM    BUN 10 01/20/2024 07:33 AM    BUN 15 05/09/2014 09:44 AM    CR 0.77 01/20/2024 07:33 AM    CR 0.69 05/09/2014 09:44 AM    GLU 81 01/20/2024 07:33 AM    GLU 96 05/09/2014 09:44 AM     Lab Results   Lab Name Value Date/Time    WBC 5.1 01/20/2024 07:33 AM    WBC 7.3 05/09/2014 09:44 AM    HGB 13.4 01/20/2024 07:33 AM    HGB 13.8 05/09/2014 09:44 AM    HCT 40.0 01/20/2024 07:33 AM    HCT 42.1 05/09/2014 09:44 AM    PLT 288 01/20/2024 07:33 AM    PLT 302 05/09/2014 09:44 AM           RADIOLOGY  Right knee X-ray:   MPRESSION:  1. Severe arthritis of the right knee, increased from May 08, 2014.  Features suggest CPPD arthropathy with possibly superimposed  osteoarthritis.      OBJECTIVE  Current Medications[1]    Ros   complete review of symptons was done all other systems are negative except as otherwise stated in HPI    Vitals  Her height is 1.651 m (5\' 5" ) and weight is 75.8 kg (167 lb 1.7 oz). Her temporal temperature is 36.3 C (97.4 F). Her blood pressure is 117/66 and her pulse is 82. Her respiration is 16 and oxygen saturation is 99%.     Physical exam -  General Appearance: healthy, alert, no distress, pleasant affect, cooperative.  Eyes:  conjunctivae and corneas clear.   Extremities: Right knee positive crepitus medial joint tenderness with mild effusion      ASSESMENT AND PLAN     (M17.11) Primary osteoarthritis of right knee  (primary encounter diagnosis)  Comment: Severe osteoarthritis confirmed by x-ray. Significant pain affects sleep and daily activities. Current management includes ibuprofen and Voltaren cream with minimal relief. Ice provides some relief. Surgery not an option due to caregiving responsibilities. Discussed potential switch to Celebrex for less gastrointestinal risk. Emphasized taking ibuprofen with food   Plan: - Continue ice application for  swelling management. - Continue Voltaren cream for pain relief. - Continue ibuprofen with food to minimize gastrointestinal side effects. - Discuss switching to Celebrex for reduced gastrointestinal risk. - Avoid activities exacerbating knee pain; consider biking and water aerobics. - Use knee brace and cane for support and stability. - Follow up with sports medicine doctor    (E78.5) Dyslipidemia  Comment:   Plan: CBC with Differential, Comprehensive Metabolic         Panel, Lipid Panel            (E03.9) Acquired hypothyroidism  Comment:   Plan: Thyroid Stimulating Hormone             I did review patient's past medical and family/social history, no changes noted.  Barriers to Learning assessed: none. Patient verbalizes understanding of teaching and instructions.  Zettie Cooley MD      Electronically Signed By:  Zettie Cooley, MD  Physician associate   [facility]-   [phone]  \                                   [1]   Current Outpatient Medications   Medication Sig Dispense Refill    Atorvastatin (LIPITOR) 40 mg tablet Take 1 tablet by mouth every day. 90 tablet 3    Clopidogrel (PLAVIX) 75 mg Tablet TAKE 1 TABLET EVERY MORNING 90 tablet 3    LevoTHYROxine (SYNTHROID) 112 mcg Tablet Take 1 tablet by mouth every day. FOR THYROID 90 tablet 3    Naproxen (NAPROSYN) 500 mg Tablet Take 1 tablet by mouth 2 times daily with meals. 60 tablet 1     No current facility-administered medications for this visit.

## 2024-01-27 NOTE — Telephone Encounter (Signed)
I called patient ID [id number]. Made patient appointment .

## 2024-01-31 NOTE — Progress Notes (Unsigned)
 Knee Viscosupplementation Injection Note    Today's date: 02/02/2024    Subjective:    Complaints of  significant RLE knee pain and stiffness.  Here today to proceed with right knee Durolane injection.  Had right knee corticosteroid injection on 10/21/2023 that did improve her knee pain about 50%, but only for about 6 weeks then pain worsened again.  Continues to worsen.  Using knee brace, cane, taking ibuprofen as needed.  No fevers or chills.  Pain score 8/10.    Objective: Pulse 75   Temp 36.6 C (97.8 F) (Temporal)   Ht 1.651 m (5\' 5" )   Wt 75.8 kg (167 lb)   SpO2 97%   BMI 27.79 kg/m    No new abnormalities    Assessment:   Right knee osteoarthritis     Plan Today:   Right knee joint Durolane 60 mg/3 ml Injection     Procedure:   Chlorhexidine Prep   Injection Right Knee through inferolateral approach    Durolane 60 mg/3 ml prefilled syringe    The patient tolerated the injection well with no problems.    Follow up Instructions:   Restrict exercise and walking for 2-3 days.   Ice knee  for 2 days after injection.   Monitor for signs of infection or knee effusion  Continue supportive care  The patient already has follow up appointment scheduled in early May, can proceed with right knee corticosteroid injection at that time if no improvement in her symptoms.    Electronically signed by:  Roslynn Coombes, MD  Orthopaedic Surgery

## 2024-02-02 ENCOUNTER — Ambulatory Visit: Admitting: Orthopaedic Surgery

## 2024-02-02 ENCOUNTER — Other Ambulatory Visit: Payer: Self-pay | Admitting: Internal Medicine

## 2024-02-02 DIAGNOSIS — M1711 Unilateral primary osteoarthritis, right knee: Secondary | ICD-10-CM

## 2024-02-02 NOTE — Nursing Note (Signed)
 Patient identified by name and date of birth, and address.Chief complaint taken, allergies reviewed, medication and pharmacy verified, vitals performed, vaccine record reviewed, and care gaps addressed with patient.  Also asked if they have been hospitalized since last visit, patient denies being hospitalized since last office visit. Patient here today for right knee pain . Patient here today with self.  Tammy Freeney Lankin MA II

## 2024-02-05 NOTE — Telephone Encounter (Signed)
 Pharmacy Refill Optimization (PRO)    Refill authorized per PRO CPA 690-00 02/05/2024    Meets PRO CPA 690-00: YES    See Protocol Details for additional information   ====================================================================    Medication name:   Requested Prescriptions     Pending Prescriptions Disp Refills    Clopidogrel  (PLAVIX ) 75 mg Tablet [Pharmacy Med Name: CLOPIDOGREL  BISULFATE TABS 75MG ] 90 tablet 3     Sig: TAKE 1 TABLET EVERY MORNING     Labs (if required by protocol):   Lab Results   Component Value Date    WBC 5.1 01/20/2024    WBC 5.5 07/21/2023    HGB 13.4 01/20/2024    HGB 14.0 07/21/2023    HCT 40.0 01/20/2024    HCT 41.3 07/21/2023    PLT 288 01/20/2024    PLT 264 07/21/2023

## 2024-02-15 DIAGNOSIS — M48062 Spinal stenosis, lumbar region with neurogenic claudication: Secondary | ICD-10-CM | POA: Diagnosis not present

## 2024-02-15 DIAGNOSIS — G894 Chronic pain syndrome: Secondary | ICD-10-CM | POA: Diagnosis not present

## 2024-02-15 DIAGNOSIS — M47816 Spondylosis without myelopathy or radiculopathy, lumbar region: Secondary | ICD-10-CM | POA: Diagnosis not present

## 2024-02-15 DIAGNOSIS — M5416 Radiculopathy, lumbar region: Secondary | ICD-10-CM | POA: Diagnosis not present

## 2024-02-16 NOTE — Progress Notes (Signed)
 Name: Tammy [name]  MRN: [id number]  DOB: 1941-08-19  AGE: 65yr  DATE: 02/23/2024    Knee  Injection Note    Today's date: 02/23/2024      Subjective:    Complaints of  significant RLE knee pain and stiffness.  Had right knee Durolane injection on 02/02/2024 with no improvement in his symptoms.  Last right knee corticosteroid injection on 10/21/2023 did improve her knee pain about 50%, but only for about 6 weeks then pain worsened again.  Knee pain and symptoms unchanged, not as severe as they were prior to the last corticosteroid injection.  Symptoms come and go.  Worse in the morning and at the end of the day and after increased walking, standing, activity.  Using knee brace and cane.  Has been icing.  Taking Tylenol and ibuprofen as needed.  Pain score 5/10.  No fevers or chills.  Patient would like to proceed with right knee corticosteroid injection today.    Objective: Pulse 77   Temp 36.6 C (97.9 F) (Temporal)   Ht 1.651 m (5\' 5" )   Wt 75.8 kg (167 lb)   SpO2 97%   BMI 27.79 kg/m    No new abnormalities    Assessment:  83 year old female with Right knee osteoarthritis     Plan:   Right knee joint corticosteroid injection     Procedure Note: Steroid Injection    The risks and benefits of a steroid injection to the right knee were discussed with the patient.  Patient understood and gave their consent.  The patient was placed in seated position.  The area over the inferomeidal dog portal of the knee was prepped with chlorhexidine.  Cold spray was used for topical anesthesia.  A solution containing 1 ml of 40mg /ml Kenalog  and 2 ml of 1% lidocaine and 2 ml of 0.2% ropivicaine was injected using a 22 G 1.5" needle.  An adhesive bandage was placed over the injection site.  The patient tolerated the procedure well and had no adverse reactions or complications.  Monitor for flare reaction.    Electronically signed by:  Roslynn Coombes, MD  Orthopaedic Surgery

## 2024-02-21 DIAGNOSIS — N3020 Other chronic cystitis without hematuria: Secondary | ICD-10-CM | POA: Diagnosis not present

## 2024-02-21 DIAGNOSIS — D3000 Benign neoplasm of unspecified kidney: Secondary | ICD-10-CM | POA: Diagnosis not present

## 2024-02-23 ENCOUNTER — Ambulatory Visit: Admitting: Orthopaedic Surgery

## 2024-02-23 DIAGNOSIS — M1711 Unilateral primary osteoarthritis, right knee: Secondary | ICD-10-CM

## 2024-02-23 DIAGNOSIS — D234 Other benign neoplasm of skin of scalp and neck: Secondary | ICD-10-CM | POA: Diagnosis not present

## 2024-02-23 DIAGNOSIS — D485 Neoplasm of uncertain behavior of skin: Secondary | ICD-10-CM | POA: Diagnosis not present

## 2024-02-23 DIAGNOSIS — Z1283 Encounter for screening for malignant neoplasm of skin: Secondary | ICD-10-CM | POA: Diagnosis not present

## 2024-02-23 DIAGNOSIS — D225 Melanocytic nevi of trunk: Secondary | ICD-10-CM | POA: Diagnosis not present

## 2024-02-23 NOTE — Nursing Note (Signed)
 Patient identified by name and date of birth, and address.Chief complaint taken, allergies reviewed, medication and pharmacy verified, vitals performed, vaccine record reviewed, and care gaps addressed with patient.  Also asked if they have been hospitalized since last visit, patient denies being hospitalized since last office visit. Patient here today for right knee pain . Patient here today with self.  Milburn Freeney Lankin MA II

## 2024-02-24 DIAGNOSIS — K219 Gastro-esophageal reflux disease without esophagitis: Secondary | ICD-10-CM | POA: Diagnosis not present

## 2024-02-24 DIAGNOSIS — K5900 Constipation, unspecified: Secondary | ICD-10-CM | POA: Diagnosis not present

## 2024-02-24 DIAGNOSIS — R1310 Dysphagia, unspecified: Secondary | ICD-10-CM | POA: Diagnosis not present

## 2024-02-24 DIAGNOSIS — K449 Diaphragmatic hernia without obstruction or gangrene: Secondary | ICD-10-CM | POA: Diagnosis not present

## 2024-03-06 DIAGNOSIS — M47816 Spondylosis without myelopathy or radiculopathy, lumbar region: Secondary | ICD-10-CM | POA: Diagnosis not present

## 2024-03-06 DIAGNOSIS — M5416 Radiculopathy, lumbar region: Secondary | ICD-10-CM | POA: Diagnosis not present

## 2024-03-06 DIAGNOSIS — M48062 Spinal stenosis, lumbar region with neurogenic claudication: Secondary | ICD-10-CM | POA: Diagnosis not present

## 2024-03-06 DIAGNOSIS — S30850A Superficial foreign body of lower back and pelvis, initial encounter: Secondary | ICD-10-CM | POA: Diagnosis not present

## 2024-03-21 DIAGNOSIS — M4722 Other spondylosis with radiculopathy, cervical region: Secondary | ICD-10-CM | POA: Diagnosis not present

## 2024-03-21 DIAGNOSIS — M5030 Other cervical disc degeneration, unspecified cervical region: Secondary | ICD-10-CM | POA: Diagnosis not present

## 2024-03-21 DIAGNOSIS — M47812 Spondylosis without myelopathy or radiculopathy, cervical region: Secondary | ICD-10-CM | POA: Diagnosis not present

## 2024-03-21 DIAGNOSIS — M50122 Cervical disc disorder at C5-C6 level with radiculopathy: Secondary | ICD-10-CM | POA: Diagnosis not present

## 2024-03-21 DIAGNOSIS — M160 Bilateral primary osteoarthritis of hip: Secondary | ICD-10-CM | POA: Diagnosis not present

## 2024-03-21 DIAGNOSIS — M4802 Spinal stenosis, cervical region: Secondary | ICD-10-CM | POA: Diagnosis not present

## 2024-03-21 DIAGNOSIS — M50123 Cervical disc disorder at C6-C7 level with radiculopathy: Secondary | ICD-10-CM | POA: Diagnosis not present

## 2024-03-27 ENCOUNTER — Telehealth: Payer: Self-pay | Admitting: Orthopaedic Surgery

## 2024-03-27 DIAGNOSIS — M1711 Unilateral primary osteoarthritis, right knee: Secondary | ICD-10-CM

## 2024-03-27 NOTE — Telephone Encounter (Signed)
 Patient requesting another shot, patient had injection on 5/7    Candace Braswell  PSR III Referral Coordinator  [facility]  Fax: [phone]  Pt. Line: [phone]

## 2024-03-27 NOTE — Telephone Encounter (Signed)
 Referral Request:    Patient is calling to request updated referral to Orthopedics regarding a problem of Right knee pain inj Durolane . She requests a referral her insurance is Medicare/[employer] .   Pt requesting coc.    Tammy [name]  Patient Contact Center  Specialty Scheduling Unit  Patient Line: [phone]

## 2024-03-27 NOTE — Telephone Encounter (Signed)
 I called spoke to patient ID [id number]. Patient would like to have the referral to see pain management for the nerve ablation procedure.   She said she wants to go to [facility], because of her husband she cannot go [facility].

## 2024-04-05 DIAGNOSIS — M5030 Other cervical disc degeneration, unspecified cervical region: Secondary | ICD-10-CM | POA: Diagnosis not present

## 2024-04-05 DIAGNOSIS — M4802 Spinal stenosis, cervical region: Secondary | ICD-10-CM | POA: Diagnosis not present

## 2024-04-05 DIAGNOSIS — M4722 Other spondylosis with radiculopathy, cervical region: Secondary | ICD-10-CM | POA: Diagnosis not present

## 2024-04-10 NOTE — Telephone Encounter (Signed)
 I called patient gave her number for radiology.     Patient can call  [phone], press option 3 and request a cd to be mailed to them

## 2024-04-10 NOTE — Telephone Encounter (Signed)
 Patient calling in to [facility] asking for disc of imaging. Per patient she is scheduled on   7/29 at [facility] in [facility], however they are telling her the need a disc of all related imaging for knee. They do not want imaging report (that was faxed to them 6/12) they are telling her they need a full disc of radiology to view and read themselves. Patient is stating they told her without this they will not keep appt scheduled and she will be rescheduled. Patient would like phone call from doctors office to discuss how to obtain this request. She would like call at [phone], ok to leave message here.     Ileana CHRISTELLA LAWMAN III - [facility]   Phone: [phone]   Fax: [phone]  Message Pool: [facility] Referral Unit

## 2024-04-13 DIAGNOSIS — Z1231 Encounter for screening mammogram for malignant neoplasm of breast: Secondary | ICD-10-CM | POA: Diagnosis not present

## 2024-04-13 DIAGNOSIS — R92323 Mammographic fibroglandular density, bilateral breasts: Secondary | ICD-10-CM | POA: Diagnosis not present

## 2024-04-20 DIAGNOSIS — R051 Acute cough: Secondary | ICD-10-CM | POA: Diagnosis not present

## 2024-04-20 DIAGNOSIS — Z03818 Encounter for observation for suspected exposure to other biological agents ruled out: Secondary | ICD-10-CM | POA: Diagnosis not present

## 2024-04-20 DIAGNOSIS — J069 Acute upper respiratory infection, unspecified: Secondary | ICD-10-CM | POA: Diagnosis not present

## 2024-04-20 DIAGNOSIS — J0100 Acute maxillary sinusitis, unspecified: Secondary | ICD-10-CM | POA: Diagnosis not present

## 2024-05-03 DIAGNOSIS — R928 Other abnormal and inconclusive findings on diagnostic imaging of breast: Secondary | ICD-10-CM | POA: Diagnosis not present

## 2024-05-03 DIAGNOSIS — R92322 Mammographic fibroglandular density, left breast: Secondary | ICD-10-CM | POA: Diagnosis not present

## 2024-05-03 DIAGNOSIS — R921 Mammographic calcification found on diagnostic imaging of breast: Secondary | ICD-10-CM | POA: Diagnosis not present

## 2024-05-05 DIAGNOSIS — R053 Chronic cough: Secondary | ICD-10-CM | POA: Diagnosis not present

## 2024-05-11 ENCOUNTER — Encounter: Payer: Self-pay | Admitting: Internal Medicine

## 2024-05-11 ENCOUNTER — Ambulatory Visit: Admitting: Internal Medicine

## 2024-05-11 VITALS — BP 132/71 | HR 72 | Temp 97.6°F | Resp 14 | Ht 66.0 in | Wt 156.5 lb

## 2024-05-11 DIAGNOSIS — M1711 Unilateral primary osteoarthritis, right knee: Secondary | ICD-10-CM

## 2024-05-11 DIAGNOSIS — C50412 Malignant neoplasm of upper-outer quadrant of left female breast: Secondary | ICD-10-CM | POA: Diagnosis not present

## 2024-05-11 DIAGNOSIS — R921 Mammographic calcification found on diagnostic imaging of breast: Secondary | ICD-10-CM | POA: Diagnosis not present

## 2024-05-11 DIAGNOSIS — D0512 Intraductal carcinoma in situ of left breast: Secondary | ICD-10-CM | POA: Diagnosis not present

## 2024-05-11 DIAGNOSIS — R92322 Mammographic fibroglandular density, left breast: Secondary | ICD-10-CM | POA: Diagnosis not present

## 2024-05-11 DIAGNOSIS — Z17411 Hormone receptor positive with human epidermal growth factor receptor 2 negative status: Secondary | ICD-10-CM | POA: Diagnosis not present

## 2024-05-11 MED ORDER — CELECOXIB 200 MG CAPSULE
200.0000 mg | ORAL_CAPSULE | Freq: Every day | ORAL | 0 refills | Status: AC
Start: 2024-05-11 — End: 2025-05-11

## 2024-05-11 NOTE — Nursing Note (Signed)
 Patient verified x3. Being seen for knee pain. Vital signs taken, allergies, and pharmacy verified. Care gaps and immunizations reviewed with patient. Patient is accompanied by self.     Lyndle Santa, MA

## 2024-05-11 NOTE — Progress Notes (Signed)
 Chief Complaint   Patient presents with    Knee Pain          I obtained verbal consent from the patient to use AI ambient technology to transcribe the interactions between the patient and myself during the clinical encounter.    SUBJECTIVE      Tammy [name] is a 83yr old female.    History of Present Illness  Tammy [name] is an 83 year old female with severe right knee arthritis who presents with worsening knee pain and functional limitations.    Right knee pain and functional impairment  - Severe right knee pain described as 'bone on bone'  - Pain intensity rated as 'ten plus' in the morning, decreasing to 8-6 throughout the day  - Pain radiates to right hip and ankle  - Difficulty laying flat or turning in bed due to pain  - Significant impact on daily activities, including household chores, yard work, and grocery shopping  - Difficulty managing stairs at home      Pain management and prior interventions  - Received three intra-articular injections for knee pain, last administered on May 7th, with insufficient relief  - Uses ibuprofen and Advil for pain management, taking four pills in the morning and four at night  -Seen by pain management Dr. Sheryle who recommended knee replacement  - Trialed other medications and topical treatments, including Voltaren, without success  - Icing and movement provide slight improvement in pain    Constitutional symptoms and psychosocial impact  - Significant stress related to chronic pain  - Unintentional weight loss of 10 pounds since last visit, attributed to pain and stress             Results           OBJECTIVE      Current Medications[1]    Ros   complete review of symptons was done all other systems are negative except as otherwise stated in HPI    Vitals  Her height is 1.676 m (5' 6) and weight is 71 kg (156 lb 8.4 oz). Her temporal temperature is 36.4 C (97.6 F). Her blood pressure is 132/71 and her pulse is 72. Her respiration is 14 and oxygen saturation is 97%.      Physical exam -  General Appearance: healthy, alert, no distress, pleasant affect, cooperative.  Eyes:  conjunctivae and corneas clear. PERRL, EOM's intact. sclerae normal.  Ears:  external inspection of ears show no abnormality.  Right knee positive crepitus medial and lateral joint tenderness, antalgic gait      ASSESMENT AND PLAN     (M17.11) Primary osteoarthritis of right knee  (primary encounter diagnosis)  Comment: Severe osteoarthritis with bone-on-bone contact causing debilitating pain and functional impairment. Conservative treatments ineffective. Surgical intervention recommended.   Plan: Orthopedic-General Referral        - Place urgent referral to orthopedic surgeon for evaluation and potential surgery   -Current regimen ineffective. Risk of gastrointestinal bleeding with ibuprofen and Advil due to clopidogrel . Celebrex  recommended for lower GI risk.      I did review patient's past medical and family/social history, no changes noted.  Barriers to Learning assessed: none. Patient verbalizes understanding of teaching and instructions.  JACKLYN MANTIS MD      Electronically Signed By:  JACKLYN MANTIS, MD  Physician associate   [place] [facility] [place]  [phone]                             [  1]   Current Outpatient Medications   Medication Sig Dispense Refill    Atorvastatin  (LIPITOR) 40 mg tablet Take 1 tablet by mouth every day. 90 tablet 3    Clopidogrel  (PLAVIX ) 75 mg Tablet Take 1 tablet by mouth every morning. 90 tablet 3    LevoTHYROxine  (SYNTHROID ) 112 mcg Tablet Take 1 tablet by mouth every day. FOR THYROID  90 tablet 3    Naproxen  (NAPROSYN ) 500 mg Tablet Take 1 tablet by mouth 2 times daily with meals. 60 tablet 1     No current facility-administered medications for this visit.

## 2024-05-17 ENCOUNTER — Encounter: Payer: Self-pay | Admitting: Internal Medicine

## 2024-05-17 DIAGNOSIS — Z8041 Family history of malignant neoplasm of ovary: Secondary | ICD-10-CM | POA: Diagnosis not present

## 2024-05-17 DIAGNOSIS — Z7183 Encounter for nonprocreative genetic counseling: Secondary | ICD-10-CM | POA: Diagnosis not present

## 2024-05-17 DIAGNOSIS — C50919 Malignant neoplasm of unspecified site of unspecified female breast: Secondary | ICD-10-CM | POA: Diagnosis not present

## 2024-05-17 DIAGNOSIS — Z803 Family history of malignant neoplasm of breast: Secondary | ICD-10-CM | POA: Diagnosis not present

## 2024-05-24 ENCOUNTER — Ambulatory Visit: Attending: Internal Medicine

## 2024-05-24 ENCOUNTER — Ambulatory Visit: Payer: Self-pay | Admitting: Internal Medicine

## 2024-05-24 DIAGNOSIS — E785 Hyperlipidemia, unspecified: Secondary | ICD-10-CM | POA: Insufficient documentation

## 2024-05-24 DIAGNOSIS — E039 Hypothyroidism, unspecified: Secondary | ICD-10-CM | POA: Insufficient documentation

## 2024-05-24 DIAGNOSIS — I2510 Atherosclerotic heart disease of native coronary artery without angina pectoris: Secondary | ICD-10-CM | POA: Diagnosis not present

## 2024-05-24 DIAGNOSIS — I255 Ischemic cardiomyopathy: Secondary | ICD-10-CM | POA: Diagnosis not present

## 2024-05-24 DIAGNOSIS — C50412 Malignant neoplasm of upper-outer quadrant of left female breast: Secondary | ICD-10-CM | POA: Diagnosis not present

## 2024-05-24 DIAGNOSIS — Z170 Estrogen receptor positive status [ER+]: Secondary | ICD-10-CM | POA: Diagnosis not present

## 2024-05-24 DIAGNOSIS — C50912 Malignant neoplasm of unspecified site of left female breast: Secondary | ICD-10-CM | POA: Diagnosis not present

## 2024-05-24 DIAGNOSIS — I10 Essential (primary) hypertension: Secondary | ICD-10-CM | POA: Diagnosis not present

## 2024-05-24 LAB — CBC WITH DIFFERENTIAL
Basophils % Auto: 1.1 %
Basophils Abs Auto: 0.1 K/MM3 (ref 0.0–0.2)
Eosinophils % Auto: 2.3 %
Eosinophils Abs Auto: 0.1 K/MM3 (ref 0.0–0.5)
Hematocrit: 40.6 % (ref 36.0–46.0)
Hemoglobin: 13.6 g/dL (ref 12.0–16.0)
Lymphocytes % Auto: 28.5 %
Lymphocytes Abs Auto: 1.6 K/MM3 (ref 1.0–4.8)
MCH: 30.4 pg (ref 27.0–33.0)
MCHC: 33.4 % (ref 32.0–36.0)
MCV: 91.1 fL (ref 80.0–100.0)
MPV: 8.9 fL (ref 6.8–10.0)
Monocytes % Auto: 8.1 %
Monocytes Abs Auto: 0.5 K/MM3 (ref 0.1–0.8)
Neutrophils % Auto: 60 %
Neutrophils Abs Auto: 3.4 K/MM3 (ref 1.8–7.7)
Platelet Count: 283 K/MM3 (ref 130–400)
RDW: 13.9 % (ref 0.0–14.7)
Red Blood Cell Count: 4.46 M/MM3 (ref 4.00–5.20)
White Blood Cell Count: 5.7 K/MM3 (ref 4.5–11.0)

## 2024-05-24 LAB — LIPID PANEL
Cholesterol: 188 mg/dL (ref <200)
HDL Cholesterol: 65 mg/dL (ref >40)
LDL Cholesterol Calculation: 106 mg/dL — ABNORMAL HIGH (ref <100)
Non-HDL Cholesterol: 123 mg/dL (ref <=150)
Total Cholesterol: HDL Ratio: 2.9 (ref <4.0)
Triglyceride Level: 86 mg/dL (ref <150)

## 2024-05-24 LAB — COMPREHENSIVE METABOLIC PANEL
Alanine Transferase (ALT): 10 U/L (ref <=33)
Albumin: 4 g/dL (ref 4.0–4.9)
Alkaline Phosphatase (ALP): 69 U/L (ref 35–129)
Anion Gap: 12 mmol/L (ref 7–15)
Aspartate Transaminase (AST): 15 U/L (ref <=41)
Bilirubin Total: 0.6 mg/dL (ref <=1.2)
Calcium: 8.8 mg/dL (ref 8.6–10.0)
Carbon Dioxide Total: 26 mmol/L (ref 22–29)
Chloride: 104 mmol/L (ref 98–107)
Creatinine Serum: 0.73 mg/dL (ref 0.51–1.17)
E-GFR Creatinine (Female): 82 mL/min/1.73m*2
Glucose: 94 mg/dL (ref 74–109)
Potassium: 3.7 mmol/L (ref 3.4–5.1)
Protein: 6.2 g/dL — ABNORMAL LOW (ref 6.6–8.7)
Sodium: 142 mmol/L (ref 136–145)
Urea Nitrogen, Blood (BUN): 13 mg/dL (ref 6–20)

## 2024-05-24 LAB — THYROID STIMULATING HORMONE: Thyroid Stimulating Hormone: 2.84 u[IU]/mL (ref 0.27–4.20)

## 2024-05-31 ENCOUNTER — Ambulatory Visit: Admitting: Internal Medicine

## 2024-05-31 DIAGNOSIS — C50912 Malignant neoplasm of unspecified site of left female breast: Secondary | ICD-10-CM | POA: Diagnosis not present

## 2024-05-31 DIAGNOSIS — C50412 Malignant neoplasm of upper-outer quadrant of left female breast: Secondary | ICD-10-CM | POA: Diagnosis not present

## 2024-05-31 DIAGNOSIS — R92322 Mammographic fibroglandular density, left breast: Secondary | ICD-10-CM | POA: Diagnosis not present

## 2024-06-06 DIAGNOSIS — D0512 Intraductal carcinoma in situ of left breast: Secondary | ICD-10-CM | POA: Diagnosis not present

## 2024-06-06 DIAGNOSIS — Z170 Estrogen receptor positive status [ER+]: Secondary | ICD-10-CM | POA: Diagnosis not present

## 2024-06-06 DIAGNOSIS — I10 Essential (primary) hypertension: Secondary | ICD-10-CM | POA: Diagnosis not present

## 2024-06-06 DIAGNOSIS — K219 Gastro-esophageal reflux disease without esophagitis: Secondary | ICD-10-CM | POA: Diagnosis not present

## 2024-06-06 DIAGNOSIS — R928 Other abnormal and inconclusive findings on diagnostic imaging of breast: Secondary | ICD-10-CM | POA: Diagnosis not present

## 2024-06-06 DIAGNOSIS — C50912 Malignant neoplasm of unspecified site of left female breast: Secondary | ICD-10-CM | POA: Diagnosis not present

## 2024-06-06 DIAGNOSIS — I2510 Atherosclerotic heart disease of native coronary artery without angina pectoris: Secondary | ICD-10-CM | POA: Diagnosis not present

## 2024-06-06 DIAGNOSIS — C50312 Malignant neoplasm of lower-inner quadrant of left female breast: Secondary | ICD-10-CM | POA: Diagnosis not present

## 2024-06-14 ENCOUNTER — Telehealth: Payer: Self-pay | Admitting: Internal Medicine

## 2024-06-14 NOTE — Telephone Encounter (Signed)
 Reviewing care everywhere, it looks like they want standard clearance. Will wait for communication from ortho.

## 2024-06-14 NOTE — Telephone Encounter (Signed)
 General Advice / Message to MD:    Pt called stating that she just had an appointment with her Ortho surgeon Dr. Juliane Dress at [facility]. Pt states that Dr. Dress told pt that she is needing to have Dr. Lonie place a referral for an MRI of her heart to see if she will be ok to withstand the surgery. Pt states Dr. Dress told her that she has to have the MRI prior to scheduling the surgery for her knee replacement. Please advise and call pt.             Tammy [name]  [facility]  [facility] [place]  [phone]

## 2024-06-15 NOTE — Telephone Encounter (Signed)
 General Advice / Message to MD:    Patient is calling stating she does not need the referral for MRI of the heart, she made a mistake. She is needing an EKG for her surgery in March and was told she needs to complete this as soon as possible.     She was told she can not wait on this, she can be put on a wait list if the EKG can be done. Patient has pre op with surgeon in February 23rd 2026.    Patient was given 06/20/24 pre op appointment with MD.     No further action needed.   3 patient identifiers used.

## 2024-06-20 ENCOUNTER — Ambulatory Visit: Admitting: Internal Medicine

## 2024-06-20 ENCOUNTER — Encounter: Payer: Self-pay | Admitting: Internal Medicine

## 2024-06-20 VITALS — BP 151/70 | HR 58 | Temp 97.4°F | Resp 14 | Ht 64.0 in | Wt 154.5 lb

## 2024-06-20 DIAGNOSIS — Z01818 Encounter for other preprocedural examination: Secondary | ICD-10-CM

## 2024-06-20 DIAGNOSIS — E7800 Pure hypercholesterolemia, unspecified: Secondary | ICD-10-CM

## 2024-06-20 NOTE — Progress Notes (Signed)
 Chief complaint preop         I obtained verbal consent from the patient to use AI ambient technology to transcribe the interactions between the patient and myself during the clinical encounter.    SUBJECTIVE      Tammy [name] is a 83yr old female.    History of Present Illness  Tammy [name] is an 83 year old female with right knee arthritis who presents for a preoperative evaluation for knee replacement surgery.    She has right knee pain due to arthritis and has previously received injections for treatment. She has been delaying surgery due to her husband's health issues and her own emotional readiness.    She manages her knee pain with ice and heat, which she finds helpful. Despite the pain, she remains active, performing daily activities such as vacuuming, laundry, and walking her dog three to four times a day. She can manage stairs by taking them one at a time and can walk about two blocks before the pain becomes too much.She has not had any previous issues with anesthesia.    She has experienced unintentional weight loss, losing approximately 20 pounds since April, and currently weighs 154 pounds.    No chest pain. She experiences shortness of breath with overexertion. No history of problems with anesthesia          Results      DIAGNOSTIC  EKG: Normal sinus rhythm at 66 bpm         OBJECTIVE      Current Medications[1]    Ros   complete review of symptons was done all other systems are negative except as otherwise stated in HPI    Vs  BP (!) 151/70 (SITE: right arm, Orthostatic Position: sitting, Cuff Size: regular)   Pulse 58   Temp 36.3 C (97.4 F) (Temporal)   Resp 14   Ht 1.626 m (5' 4)   Wt 70.1 kg (154 lb 8.7 oz)   SpO2 97%   BMI 26.53 kg/m   No are you working with okay    Physical exam -  General Appearance: healthy, alert, no distress, pleasant affect, cooperative.  Eyes:  conjunctivae and corneas clear.   Ears:  external inspection of ears show no abnormality.  Lungs: no chest deformities  noted .lungs clear to auscultation.  Heart:  normal rate and regular rhythm, no murmurs,  Extremities:  no cyanosis, clubbing, or edema.    ASSESMENT AND PLAN     (Z01.818) Preop examination  (primary encounter diagnosis)  Comment: Severe osteoarthritis with significant pain and functional limitation. Conservative management failed.  Scheduled for right knee replacement surgery in March 2026.   Plan: Low risk for surgery    (E78.00) Pure hypercholesterolemia  Comment:   Plan: CBC with Differential, Comprehensive Metabolic         Panel, Lipid Panel             I did review patient's past medical and family/social history, no changes noted.  Barriers to Learning assessed: none. Patient verbalizes understanding of teaching and instructions.  JACKLYN MANTIS MD      Electronically Signed By:  JACKLYN MANTIS, MD  Physician associate   [place] [facility] [place]  [phone]                             [1]   Current Outpatient Medications   Medication Sig Dispense Refill  Atorvastatin  (LIPITOR) 40 mg tablet Take 1 tablet by mouth every day. 90 tablet 3    Celecoxib  (CELEBREX ) 200 mg Capsule Take 1 capsule by mouth every day. (arthritis pain) 30 capsule 0    Clopidogrel  (PLAVIX ) 75 mg Tablet Take 1 tablet by mouth every morning. 90 tablet 3    LevoTHYROxine  (SYNTHROID ) 112 mcg Tablet Take 1 tablet by mouth every day. FOR THYROID  90 tablet 3     No current facility-administered medications for this visit.

## 2024-06-20 NOTE — Nursing Note (Signed)
 Patient verified x3. Being seen for pre-op. Vital signs taken, allergies, and pharmacy verified. Care gaps and immunizations reviewed with patient. Patient is accompanied by self.     Harrell Lark, MA

## 2024-06-23 LAB — POC ELECTROCARDIOGRAM WITH RHYTHM STRIP: QTC: 438

## 2024-06-30 DIAGNOSIS — Z170 Estrogen receptor positive status [ER+]: Secondary | ICD-10-CM | POA: Diagnosis not present

## 2024-06-30 DIAGNOSIS — Z9889 Other specified postprocedural states: Secondary | ICD-10-CM | POA: Diagnosis not present

## 2024-06-30 DIAGNOSIS — I10 Essential (primary) hypertension: Secondary | ICD-10-CM | POA: Diagnosis not present

## 2024-06-30 DIAGNOSIS — E039 Hypothyroidism, unspecified: Secondary | ICD-10-CM | POA: Diagnosis not present

## 2024-06-30 DIAGNOSIS — F32A Depression, unspecified: Secondary | ICD-10-CM | POA: Diagnosis not present

## 2024-06-30 DIAGNOSIS — I255 Ischemic cardiomyopathy: Secondary | ICD-10-CM | POA: Diagnosis not present

## 2024-06-30 DIAGNOSIS — C50912 Malignant neoplasm of unspecified site of left female breast: Secondary | ICD-10-CM | POA: Diagnosis not present

## 2024-06-30 DIAGNOSIS — I2510 Atherosclerotic heart disease of native coronary artery without angina pectoris: Secondary | ICD-10-CM | POA: Diagnosis not present

## 2024-06-30 DIAGNOSIS — C50412 Malignant neoplasm of upper-outer quadrant of left female breast: Secondary | ICD-10-CM | POA: Diagnosis not present

## 2024-06-30 DIAGNOSIS — J449 Chronic obstructive pulmonary disease, unspecified: Secondary | ICD-10-CM | POA: Diagnosis not present

## 2024-06-30 DIAGNOSIS — R413 Other amnesia: Secondary | ICD-10-CM | POA: Diagnosis not present

## 2024-07-13 DIAGNOSIS — I252 Old myocardial infarction: Secondary | ICD-10-CM | POA: Diagnosis not present

## 2024-07-13 DIAGNOSIS — I2510 Atherosclerotic heart disease of native coronary artery without angina pectoris: Secondary | ICD-10-CM | POA: Diagnosis not present

## 2024-07-13 DIAGNOSIS — I255 Ischemic cardiomyopathy: Secondary | ICD-10-CM | POA: Diagnosis not present

## 2024-07-13 DIAGNOSIS — E782 Mixed hyperlipidemia: Secondary | ICD-10-CM | POA: Diagnosis not present

## 2024-07-13 DIAGNOSIS — R0602 Shortness of breath: Secondary | ICD-10-CM | POA: Diagnosis not present

## 2024-07-13 DIAGNOSIS — I10 Essential (primary) hypertension: Secondary | ICD-10-CM | POA: Diagnosis not present

## 2024-07-13 DIAGNOSIS — Z955 Presence of coronary angioplasty implant and graft: Secondary | ICD-10-CM | POA: Diagnosis not present

## 2024-07-26 DIAGNOSIS — F32A Depression, unspecified: Secondary | ICD-10-CM | POA: Diagnosis not present

## 2024-07-26 DIAGNOSIS — G40909 Epilepsy, unspecified, not intractable, without status epilepticus: Secondary | ICD-10-CM | POA: Diagnosis not present

## 2024-07-26 DIAGNOSIS — F322 Major depressive disorder, single episode, severe without psychotic features: Secondary | ICD-10-CM | POA: Diagnosis not present

## 2024-07-28 DIAGNOSIS — M4722 Other spondylosis with radiculopathy, cervical region: Secondary | ICD-10-CM | POA: Diagnosis not present

## 2024-07-28 DIAGNOSIS — Z170 Estrogen receptor positive status [ER+]: Secondary | ICD-10-CM | POA: Diagnosis not present

## 2024-07-28 DIAGNOSIS — M47816 Spondylosis without myelopathy or radiculopathy, lumbar region: Secondary | ICD-10-CM | POA: Diagnosis not present

## 2024-07-28 DIAGNOSIS — C50412 Malignant neoplasm of upper-outer quadrant of left female breast: Secondary | ICD-10-CM | POA: Diagnosis not present

## 2024-07-28 DIAGNOSIS — M461 Sacroiliitis, not elsewhere classified: Secondary | ICD-10-CM | POA: Diagnosis not present

## 2024-07-28 DIAGNOSIS — Z79811 Long term (current) use of aromatase inhibitors: Secondary | ICD-10-CM | POA: Diagnosis not present

## 2024-08-03 ENCOUNTER — Other Ambulatory Visit: Payer: Medicare Other

## 2024-08-09 DIAGNOSIS — Z79811 Long term (current) use of aromatase inhibitors: Secondary | ICD-10-CM | POA: Diagnosis not present

## 2024-08-09 DIAGNOSIS — Z170 Estrogen receptor positive status [ER+]: Secondary | ICD-10-CM | POA: Diagnosis not present

## 2024-08-09 DIAGNOSIS — C50412 Malignant neoplasm of upper-outer quadrant of left female breast: Secondary | ICD-10-CM | POA: Diagnosis not present

## 2024-08-14 DIAGNOSIS — Z170 Estrogen receptor positive status [ER+]: Secondary | ICD-10-CM | POA: Diagnosis not present

## 2024-08-14 DIAGNOSIS — C50412 Malignant neoplasm of upper-outer quadrant of left female breast: Secondary | ICD-10-CM | POA: Diagnosis not present

## 2024-08-16 DIAGNOSIS — N3020 Other chronic cystitis without hematuria: Secondary | ICD-10-CM | POA: Diagnosis not present

## 2024-08-25 DIAGNOSIS — M461 Sacroiliitis, not elsewhere classified: Secondary | ICD-10-CM | POA: Diagnosis not present

## 2024-08-29 DIAGNOSIS — N289 Disorder of kidney and ureter, unspecified: Secondary | ICD-10-CM | POA: Diagnosis not present

## 2024-08-29 DIAGNOSIS — N281 Cyst of kidney, acquired: Secondary | ICD-10-CM | POA: Diagnosis not present

## 2024-08-29 DIAGNOSIS — D3000 Benign neoplasm of unspecified kidney: Secondary | ICD-10-CM | POA: Diagnosis not present

## 2024-09-20 DIAGNOSIS — I10 Essential (primary) hypertension: Secondary | ICD-10-CM | POA: Diagnosis not present

## 2024-09-20 DIAGNOSIS — F3342 Major depressive disorder, recurrent, in full remission: Secondary | ICD-10-CM | POA: Diagnosis not present

## 2024-09-20 DIAGNOSIS — E782 Mixed hyperlipidemia: Secondary | ICD-10-CM | POA: Diagnosis not present

## 2024-09-21 ENCOUNTER — Telehealth: Payer: Self-pay | Admitting: Internal Medicine

## 2024-09-21 NOTE — Telephone Encounter (Signed)
 E-fax received for patient regarding podiatry notes for your review.   Care gaps updated: NA

## 2024-09-26 DIAGNOSIS — D49511 Neoplasm of unspecified behavior of right kidney: Secondary | ICD-10-CM | POA: Diagnosis not present

## 2024-09-27 DIAGNOSIS — K219 Gastro-esophageal reflux disease without esophagitis: Secondary | ICD-10-CM | POA: Diagnosis not present

## 2024-09-27 DIAGNOSIS — K5900 Constipation, unspecified: Secondary | ICD-10-CM | POA: Diagnosis not present

## 2024-09-27 DIAGNOSIS — R1310 Dysphagia, unspecified: Secondary | ICD-10-CM | POA: Diagnosis not present

## 2024-09-27 DIAGNOSIS — K449 Diaphragmatic hernia without obstruction or gangrene: Secondary | ICD-10-CM | POA: Diagnosis not present

## 2024-12-12 ENCOUNTER — Ambulatory Visit

## 2024-12-19 ENCOUNTER — Ambulatory Visit: Admitting: Internal Medicine
